# Patient Record
Sex: Male | Born: 1960 | Race: White | Hispanic: No | Marital: Married | Smoking: Former smoker
Health system: Southern US, Community
[De-identification: ages and names within clinical notes are randomized; demographics above are authoritative.]

## PROBLEM LIST (undated history)

## (undated) DIAGNOSIS — E039 Hypothyroidism, unspecified: Secondary | ICD-10-CM

## (undated) DIAGNOSIS — I509 Heart failure, unspecified: Secondary | ICD-10-CM

## (undated) DIAGNOSIS — R569 Unspecified convulsions: Secondary | ICD-10-CM

## (undated) DIAGNOSIS — J449 Chronic obstructive pulmonary disease, unspecified: Secondary | ICD-10-CM

## (undated) DIAGNOSIS — C3490 Malignant neoplasm of unspecified part of unspecified bronchus or lung: Secondary | ICD-10-CM

## (undated) HISTORY — PX: BACK SURGERY: SHX140

## (undated) HISTORY — DX: Malignant neoplasm of unspecified part of unspecified bronchus or lung: C34.90

## (undated) HISTORY — DX: Chronic obstructive pulmonary disease, unspecified: J44.9

## (undated) HISTORY — PX: PORTACATH PLACEMENT: SHX2246

---

## 2009-02-12 ENCOUNTER — Emergency Department: Payer: Self-pay | Admitting: Emergency Medicine

## 2009-02-13 ENCOUNTER — Emergency Department: Payer: Self-pay | Admitting: Emergency Medicine

## 2009-03-02 ENCOUNTER — Emergency Department (HOSPITAL_COMMUNITY): Admission: EM | Admit: 2009-03-02 | Discharge: 2009-03-02 | Payer: Self-pay | Admitting: Emergency Medicine

## 2010-03-01 ENCOUNTER — Encounter: Payer: Self-pay | Admitting: Internal Medicine

## 2010-03-01 ENCOUNTER — Ambulatory Visit: Payer: Self-pay

## 2010-03-01 ENCOUNTER — Encounter: Payer: Self-pay | Admitting: Family Medicine

## 2010-03-01 DIAGNOSIS — M549 Dorsalgia, unspecified: Secondary | ICD-10-CM

## 2010-03-01 DIAGNOSIS — M5430 Sciatica, unspecified side: Secondary | ICD-10-CM | POA: Insufficient documentation

## 2010-03-08 NOTE — Assessment & Plan Note (Signed)
Summary: back issues/jbb    The patient and/or caregiver has been counseled thoroughly with regard to medications prescribed including dosage, schedule, interactions, rationale for use, and possible side effects and they verbalize understanding.  Diagnoses and expected course of recovery discussed and will return if not improved as expected or if the condition worsens. Patient and/or caregiver verbalized understanding.

## 2010-03-08 NOTE — Assessment & Plan Note (Signed)
Summary: Back Pain   Vital Signs:  Patient Profile:   50 Years Old Male CC:      Lower Back Pain Height:     68 inches Weight:      126 pounds BMI:     19.23 O2 Sat:      97 % O2 treatment:    Room Air Temp:     96.4 degrees F tympanic Pulse rate:   105 / minute Pulse rhythm:   regular Resp:     20 per minute BP sitting:   141 / 91  (right arm)  Pt. in pain?   yes    Location:   lower back    Intensity:   5    Type:       aching  Vitals Entered By: Levonne Spiller EMT-P (March 01, 2010 10:57 AM)              Is Patient Diabetic? No  Does patient need assistance? Functional Status Self care Ambulation Normal Comments Pt. is a smoker. 1 pack per day.      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 His low back pain has gotten worse and is continuous and severe during the day and is only helped by being in bed laying on his side with hips flexed. The pain is dull bilateral lumbar and radiated down the post/lateral right leg to the ankle. patient denies weakness or numbness in the legs or rectal/sexual abnl.  Patient went to [facility] and was told he had "chronic back pain", was prescribed ibup and valium. No diagnostics done. While working in [employer], patient was told he had a "pinched nerve in the neck"  REVIEW OF SYSTEMS Constitutional Symptoms      Denies fever, chills, night sweats, weight loss, weight gain, and fatigue.  Eyes       Denies change in vision, eye pain, eye discharge, glasses, contact lenses, and eye surgery. Ear/Nose/Throat/Mouth       Denies hearing loss/aids, change in hearing, ear pain, ear discharge, dizziness, frequent runny nose, frequent nose bleeds, sinus problems, sore throat, hoarseness, and tooth pain or  bleeding.  Respiratory       Denies dry cough, productive cough, wheezing, shortness of breath, asthma, bronchitis, and emphysema/COPD.  Cardiovascular       Denies murmurs, chest pain, and tires easily with exhertion.    Gastrointestinal       Denies stomach pain, nausea/vomiting, diarrhea, constipation, blood in bowel movements, and indigestion. Genitourniary       Denies painful urination, blood or discharge from penis, kidney stones, and loss of urinary control. Neurological       Denies paralysis, seizures, and fainting/blackouts. Musculoskeletal       Complains of muscle pain and joint pain.      Denies joint stiffness, decreased range of motion, redness, swelling, muscle weakness, and gout.  Skin       Denies bruising, unusual mles/lumps or sores, and hair/skin or nail changes.  Psych       Complains of anxiety/stress and depression.      Denies mood changes, temper/anger issues, speech problems, and sleep problems.  Past History:  Family History: Last updated: 03/01/2010 copd with resp failure cancer  Social History: Last updated: 03/01/2010  Current Smoker Alcohol use-no Drug use-no  Past Medical History: Unremarkable  Past Surgical History: Denies surgical history  Family History: copd with resp failure cancer  Social History: Current Smoker Alcohol use-no Drug use-no Smoking Status:  current Drug Use:  no Physical Exam General appearance: Thin, no acute distress Head: normocephalic, atraumatic Eyes: conjunctivae and lids normal Neck: neck supple,  Extremities: normal extremities Neurological: grossly intact and non-focal. normal leg motor, and sharp sensation in legs Back: no tenderness over musculature, straight leg raises negative bilaterally ( pain in post thighs only), deep tendon reflexes 2+ at achilles and patella Skin: no obvious rashes MSE: oriented to time, place, and person Assessment New Problems: SCIATICA (ICD-724.3) BACK PAIN  (ICD-724.5)   Plan New Medications/Changes: MELOXICAM 15 MG TABS (MELOXICAM) 1 by mouth once daily with food  #30 x 0, 03/01/2010, J. Juline Patch MD   The patient and/or caregiver has been counseled thoroughly with regard to medications prescribed including dosage, schedule, interactions, rationale for use, and possible side effects and they verbalize understanding.  Diagnoses and expected course of recovery discussed and will return if not improved as expected or if the condition worsens. Patient and/or caregiver verbalized understanding.  Prescriptions: MELOXICAM 15 MG TABS (MELOXICAM) 1 by mouth once daily with food  #30 x 0   Entered and Authorized by:   J. Juline Patch MD   Signed by:   Shela Commons. Juline Patch MD on 03/01/2010   Method used:   Electronically to        [facility] (retail)       [address]       [place], [place]  [address]       Ph: [phone]       Fax: [phone]   RxID:   [id number]   Patient Instructions: 1)  get lumbar back brace. 2)  stop ibuprofen. 3)  take the valium only while home. 4)  stop smoking 5)  get blood pressure recheck. 6)  Please schedule an appointment with your primary doctor in 1 week for further evaluation and treatment. 7)  restrict lifting, pushing, pulling to 20 lbs until recheck.

## 2010-04-12 LAB — DIFFERENTIAL
Lymphocytes Relative: 15 % (ref 12–46)
Lymphs Abs: 1.5 10*3/uL (ref 0.7–4.0)
Monocytes Relative: 8 % (ref 3–12)
Neutro Abs: 7.8 10*3/uL — ABNORMAL HIGH (ref 1.7–7.7)
Neutrophils Relative %: 77 % (ref 43–77)

## 2010-04-12 LAB — POCT I-STAT, CHEM 8
BUN: 12 mg/dL (ref 6–23)
Calcium, Ion: 1.08 mmol/L — ABNORMAL LOW (ref 1.12–1.32)
Creatinine, Ser: 0.8 mg/dL (ref 0.4–1.5)
Glucose, Bld: 98 mg/dL (ref 70–99)
HCT: 40 % (ref 39.0–52.0)
Hemoglobin: 13.6 g/dL (ref 13.0–17.0)
Sodium: 140 mEq/L (ref 135–145)
TCO2: 31 mmol/L (ref 0–100)

## 2010-04-12 LAB — CK TOTAL AND CKMB (NOT AT ARMC)
CK, MB: 0.5 ng/mL (ref 0.3–4.0)
Relative Index: INVALID (ref 0.0–2.5)
Relative Index: INVALID (ref 0.0–2.5)
Total CK: 32 U/L (ref 7–232)

## 2010-04-12 LAB — POCT CARDIAC MARKERS: Troponin i, poc: 0.13 ng/mL — ABNORMAL HIGH (ref 0.00–0.09)

## 2010-04-12 LAB — CBC
HCT: 40.6 % (ref 39.0–52.0)
Platelets: 175 10*3/uL (ref 150–400)
RBC: 4.44 MIL/uL (ref 4.22–5.81)
WBC: 10.2 10*3/uL (ref 4.0–10.5)

## 2010-04-12 LAB — TROPONIN I: Troponin I: 0.01 ng/mL (ref 0.00–0.06)

## 2010-08-09 ENCOUNTER — Emergency Department: Payer: Self-pay | Admitting: Emergency Medicine

## 2011-01-31 ENCOUNTER — Emergency Department: Payer: Self-pay | Admitting: Emergency Medicine

## 2011-01-31 LAB — COMPREHENSIVE METABOLIC PANEL
Albumin: 3.3 g/dL — ABNORMAL LOW (ref 3.4–5.0)
Alkaline Phosphatase: 64 U/L (ref 50–136)
BUN: 19 mg/dL — ABNORMAL HIGH (ref 7–18)
Bilirubin,Total: 0.3 mg/dL (ref 0.2–1.0)
Creatinine: 0.77 mg/dL (ref 0.60–1.30)
Glucose: 79 mg/dL (ref 65–99)
Osmolality: 282 (ref 275–301)
SGPT (ALT): 26 U/L
Sodium: 141 mmol/L (ref 136–145)

## 2011-01-31 LAB — CBC
MCH: 29.7 pg (ref 26.0–34.0)
MCHC: 33.4 g/dL (ref 32.0–36.0)
MCV: 89 fL (ref 80–100)
Platelet: 197 10*3/uL (ref 150–440)
RBC: 4.6 10*6/uL (ref 4.40–5.90)

## 2011-02-07 ENCOUNTER — Emergency Department: Payer: Self-pay | Admitting: Unknown Physician Specialty

## 2011-02-07 LAB — CBC WITH DIFFERENTIAL/PLATELET
Basophil %: 0.4 %
HCT: 44.1 % (ref 40.0–52.0)
HGB: 14.5 g/dL (ref 13.0–18.0)
Lymphocyte %: 9.3 %
MCHC: 32.8 g/dL (ref 32.0–36.0)
MCV: 90 fL (ref 80–100)
Monocyte #: 0.6 10*3/uL (ref 0.0–0.7)
Monocyte %: 4.4 %
Neutrophil #: 10.8 10*3/uL — ABNORMAL HIGH (ref 1.4–6.5)

## 2011-02-07 LAB — URINALYSIS, COMPLETE
Blood: NEGATIVE
Ketone: NEGATIVE
Ph: 7 (ref 4.5–8.0)
Protein: NEGATIVE
Specific Gravity: 1.003 (ref 1.003–1.030)
WBC UR: <1 /HPF (ref 0–5)

## 2011-02-07 LAB — BASIC METABOLIC PANEL
Anion Gap: 9 (ref 7–16)
Calcium, Total: 9.4 mg/dL (ref 8.5–10.1)
Creatinine: 0.81 mg/dL (ref 0.60–1.30)
EGFR (African American): >60
EGFR (Non-African Amer.): >60
Glucose: 83 mg/dL (ref 65–99)
Sodium: 142 mmol/L (ref 136–145)

## 2011-09-16 ENCOUNTER — Ambulatory Visit: Payer: Self-pay | Admitting: Family Medicine

## 2011-11-23 ENCOUNTER — Emergency Department: Payer: Self-pay | Admitting: Emergency Medicine

## 2011-11-23 LAB — COMPREHENSIVE METABOLIC PANEL
Albumin: 3.6 g/dL (ref 3.4–5.0)
Alkaline Phosphatase: 70 U/L (ref 50–136)
Bilirubin,Total: 0.4 mg/dL (ref 0.2–1.0)
Co2: 33 mmol/L — ABNORMAL HIGH (ref 21–32)
Creatinine: 0.86 mg/dL (ref 0.60–1.30)
EGFR (Non-African Amer.): >60
Glucose: 90 mg/dL (ref 65–99)
Osmolality: 282 (ref 275–301)
SGPT (ALT): 31 U/L (ref 12–78)
Sodium: 142 mmol/L (ref 136–145)

## 2011-11-23 LAB — CBC
HCT: 43.7 % (ref 40.0–52.0)
MCV: 91 fL (ref 80–100)
Platelet: 193 10*3/uL (ref 150–440)
RBC: 4.82 10*6/uL (ref 4.40–5.90)
WBC: 7.1 10*3/uL (ref 3.8–10.6)

## 2011-12-31 ENCOUNTER — Ambulatory Visit: Payer: Self-pay | Admitting: Family Medicine

## 2012-02-13 ENCOUNTER — Ambulatory Visit: Payer: Self-pay | Admitting: Gastroenterology

## 2013-01-21 DIAGNOSIS — C3490 Malignant neoplasm of unspecified part of unspecified bronchus or lung: Secondary | ICD-10-CM

## 2013-01-21 HISTORY — DX: Malignant neoplasm of unspecified part of unspecified bronchus or lung: C34.90

## 2013-06-20 LAB — BASIC METABOLIC PANEL
ANION GAP: 7 (ref 7–16)
BUN: 16 mg/dL (ref 7–18)
CALCIUM: 9.4 mg/dL (ref 8.5–10.1)
CHLORIDE: 97 mmol/L — AB (ref 98–107)
CREATININE: 0.81 mg/dL (ref 0.60–1.30)
Co2: 30 mmol/L (ref 21–32)
EGFR (Non-African Amer.): >60
Glucose: 142 mg/dL — ABNORMAL HIGH (ref 65–99)
Osmolality: 272 (ref 275–301)
POTASSIUM: 4.1 mmol/L (ref 3.5–5.1)
Sodium: 134 mmol/L — ABNORMAL LOW (ref 136–145)

## 2013-06-20 LAB — CBC
HCT: 46.4 % (ref 40.0–52.0)
HGB: 14.9 g/dL (ref 13.0–18.0)
MCH: 28.4 pg (ref 26.0–34.0)
MCHC: 32.1 g/dL (ref 32.0–36.0)
MCV: 89 fL (ref 80–100)
Platelet: 263 10*3/uL (ref 150–440)
RBC: 5.23 10*6/uL (ref 4.40–5.90)
RDW: 13.7 % (ref 11.5–14.5)
WBC: 7.3 10*3/uL (ref 3.8–10.6)

## 2013-06-20 LAB — TROPONIN I: Troponin-I: <0.02 ng/mL

## 2013-06-21 ENCOUNTER — Ambulatory Visit: Payer: Self-pay | Admitting: Internal Medicine

## 2013-06-21 ENCOUNTER — Inpatient Hospital Stay: Payer: Self-pay | Admitting: Internal Medicine

## 2013-06-22 LAB — CBC WITH DIFFERENTIAL/PLATELET
Basophil #: 0 10*3/uL (ref 0.0–0.1)
Basophil %: 0.1 %
EOS PCT: 0 %
Eosinophil #: 0 10*3/uL (ref 0.0–0.7)
HCT: 39.5 % — ABNORMAL LOW (ref 40.0–52.0)
HGB: 13.1 g/dL (ref 13.0–18.0)
Lymphocyte #: 0.8 10*3/uL — ABNORMAL LOW (ref 1.0–3.6)
Lymphocyte %: 5.1 %
MCH: 28.9 pg (ref 26.0–34.0)
MCHC: 33.2 g/dL (ref 32.0–36.0)
MCV: 87 fL (ref 80–100)
MONO ABS: 0.5 x10 3/mm (ref 0.2–1.0)
MONOS PCT: 2.9 %
NEUTROS ABS: 15.1 10*3/uL — AB (ref 1.4–6.5)
NEUTROS PCT: 91.9 %
PLATELETS: 243 10*3/uL (ref 150–440)
RBC: 4.53 10*6/uL (ref 4.40–5.90)
RDW: 13.5 % (ref 11.5–14.5)
WBC: 16.4 10*3/uL — AB (ref 3.8–10.6)

## 2013-06-22 LAB — BASIC METABOLIC PANEL
Anion Gap: 5 — ABNORMAL LOW (ref 7–16)
BUN: 14 mg/dL (ref 7–18)
CALCIUM: 8.8 mg/dL (ref 8.5–10.1)
Chloride: 104 mmol/L (ref 98–107)
Co2: 30 mmol/L (ref 21–32)
Creatinine: 0.73 mg/dL (ref 0.60–1.30)
GLUCOSE: 138 mg/dL — AB (ref 65–99)
Osmolality: 280 (ref 275–301)
Potassium: 3.9 mmol/L (ref 3.5–5.1)
Sodium: 139 mmol/L (ref 136–145)

## 2013-06-22 LAB — MAGNESIUM: MAGNESIUM: 1.9 mg/dL

## 2013-06-24 LAB — PROTIME-INR
INR: 0.9
Prothrombin Time: 12.5 secs (ref 11.5–14.7)

## 2013-06-24 LAB — APTT: ACTIVATED PTT: 27.7 s (ref 23.6–35.9)

## 2013-07-02 ENCOUNTER — Ambulatory Visit: Payer: Self-pay | Admitting: Internal Medicine

## 2013-07-02 DIAGNOSIS — C3411 Malignant neoplasm of upper lobe, right bronchus or lung: Secondary | ICD-10-CM | POA: Insufficient documentation

## 2013-07-07 ENCOUNTER — Ambulatory Visit: Payer: Self-pay | Admitting: Internal Medicine

## 2013-07-07 LAB — COMPREHENSIVE METABOLIC PANEL
Albumin: 2.7 g/dL — ABNORMAL LOW (ref 3.4–5.0)
Alkaline Phosphatase: 159 U/L — ABNORMAL HIGH
Anion Gap: 7 (ref 7–16)
BILIRUBIN TOTAL: 0.3 mg/dL (ref 0.2–1.0)
BUN: 11 mg/dL (ref 7–18)
CALCIUM: 10 mg/dL (ref 8.5–10.1)
CHLORIDE: 101 mmol/L (ref 98–107)
CREATININE: 0.89 mg/dL (ref 0.60–1.30)
Co2: 34 mmol/L — ABNORMAL HIGH (ref 21–32)
EGFR (Non-African Amer.): >60
Glucose: 57 mg/dL — ABNORMAL LOW (ref 65–99)
Osmolality: 280 (ref 275–301)
POTASSIUM: 4.1 mmol/L (ref 3.5–5.1)
SGOT(AST): 29 U/L (ref 15–37)
SGPT (ALT): 48 U/L (ref 12–78)
Sodium: 142 mmol/L (ref 136–145)
Total Protein: 7.4 g/dL (ref 6.4–8.2)

## 2013-07-07 LAB — CBC CANCER CENTER
BASOS PCT: 0.4 %
Basophil #: 0 x10 3/mm (ref 0.0–0.1)
EOS ABS: 0.5 x10 3/mm (ref 0.0–0.7)
Eosinophil %: 4.5 %
HCT: 36.5 % — AB (ref 40.0–52.0)
HGB: 12.1 g/dL — AB (ref 13.0–18.0)
LYMPHS ABS: 1.4 x10 3/mm (ref 1.0–3.6)
Lymphocyte %: 12.4 %
MCH: 29.1 pg (ref 26.0–34.0)
MCHC: 33.1 g/dL (ref 32.0–36.0)
MCV: 88 fL (ref 80–100)
Monocyte #: 1.4 x10 3/mm — ABNORMAL HIGH (ref 0.2–1.0)
Monocyte %: 12.3 %
NEUTROS ABS: 7.7 x10 3/mm — AB (ref 1.4–6.5)
Neutrophil %: 70.4 %
Platelet: 292 x10 3/mm (ref 150–440)
RBC: 4.14 10*6/uL — AB (ref 4.40–5.90)
RDW: 14.9 % — AB (ref 11.5–14.5)
WBC: 11 x10 3/mm — AB (ref 3.8–10.6)

## 2013-07-07 LAB — PROTIME-INR
INR: 1
PROTHROMBIN TIME: 13 s (ref 11.5–14.7)

## 2013-07-07 LAB — PATHOLOGY REPORT

## 2013-07-12 ENCOUNTER — Inpatient Hospital Stay: Payer: Self-pay | Admitting: Cardiothoracic Surgery

## 2013-07-14 LAB — CBC WITH DIFFERENTIAL/PLATELET
BASOS ABS: 0.1 10*3/uL (ref 0.0–0.1)
Basophil %: 0.6 %
EOS PCT: 4.8 %
Eosinophil #: 0.6 10*3/uL (ref 0.0–0.7)
HCT: 36.6 % — AB (ref 40.0–52.0)
HGB: 11.6 g/dL — ABNORMAL LOW (ref 13.0–18.0)
Lymphocyte #: 2 10*3/uL (ref 1.0–3.6)
Lymphocyte %: 17 %
MCH: 27.9 pg (ref 26.0–34.0)
MCHC: 31.7 g/dL — ABNORMAL LOW (ref 32.0–36.0)
MCV: 88 fL (ref 80–100)
MONO ABS: 1 x10 3/mm (ref 0.2–1.0)
Monocyte %: 8.2 %
NEUTROS PCT: 69.4 %
Neutrophil #: 8.1 10*3/uL — ABNORMAL HIGH (ref 1.4–6.5)
Platelet: 464 10*3/uL — ABNORMAL HIGH (ref 150–440)
RBC: 4.16 10*6/uL — AB (ref 4.40–5.90)
RDW: 15 % — AB (ref 11.5–14.5)
WBC: 11.7 10*3/uL — AB (ref 3.8–10.6)

## 2013-07-14 LAB — BASIC METABOLIC PANEL
Anion Gap: 6 — ABNORMAL LOW (ref 7–16)
BUN: 17 mg/dL (ref 7–18)
Calcium, Total: 8.9 mg/dL (ref 8.5–10.1)
Chloride: 98 mmol/L (ref 98–107)
Co2: 33 mmol/L — ABNORMAL HIGH (ref 21–32)
Creatinine: 0.77 mg/dL (ref 0.60–1.30)
EGFR (Non-African Amer.): >60
Glucose: 117 mg/dL — ABNORMAL HIGH (ref 65–99)
OSMOLALITY: 276 (ref 275–301)
Potassium: 3.6 mmol/L (ref 3.5–5.1)
Sodium: 137 mmol/L (ref 136–145)

## 2013-07-17 LAB — CBC WITH DIFFERENTIAL/PLATELET
Basophil #: 0.1 10*3/uL (ref 0.0–0.1)
Basophil %: 0.9 %
EOS ABS: 1 10*3/uL — AB (ref 0.0–0.7)
Eosinophil %: 10.8 %
HCT: 34.8 % — ABNORMAL LOW (ref 40.0–52.0)
HGB: 11.7 g/dL — ABNORMAL LOW (ref 13.0–18.0)
Lymphocyte #: 1.8 10*3/uL (ref 1.0–3.6)
Lymphocyte %: 18.7 %
MCH: 29.1 pg (ref 26.0–34.0)
MCHC: 33.6 g/dL (ref 32.0–36.0)
MCV: 87 fL (ref 80–100)
Monocyte #: 1.2 x10 3/mm — ABNORMAL HIGH (ref 0.2–1.0)
Monocyte %: 12.8 %
Neutrophil #: 5.4 10*3/uL (ref 1.4–6.5)
Neutrophil %: 56.8 %
Platelet: 436 10*3/uL (ref 150–440)
RBC: 4.01 10*6/uL — AB (ref 4.40–5.90)
RDW: 15.3 % — AB (ref 11.5–14.5)
WBC: 9.5 10*3/uL (ref 3.8–10.6)

## 2013-07-17 LAB — BASIC METABOLIC PANEL
ANION GAP: 3 — AB (ref 7–16)
BUN: 22 mg/dL — ABNORMAL HIGH (ref 7–18)
CO2: 32 mmol/L (ref 21–32)
Calcium, Total: 8.8 mg/dL (ref 8.5–10.1)
Chloride: 101 mmol/L (ref 98–107)
Creatinine: 1.09 mg/dL (ref 0.60–1.30)
EGFR (Non-African Amer.): >60
Glucose: 88 mg/dL (ref 65–99)
Osmolality: 275 (ref 275–301)
Potassium: 3.9 mmol/L (ref 3.5–5.1)
Sodium: 136 mmol/L (ref 136–145)

## 2013-07-21 ENCOUNTER — Ambulatory Visit: Payer: Self-pay | Admitting: Internal Medicine

## 2013-08-02 LAB — BASIC METABOLIC PANEL
Anion Gap: 10 (ref 7–16)
BUN: 20 mg/dL — ABNORMAL HIGH (ref 7–18)
CHLORIDE: 100 mmol/L (ref 98–107)
CO2: 30 mmol/L (ref 21–32)
CREATININE: 1.04 mg/dL (ref 0.60–1.30)
Calcium, Total: 9.5 mg/dL (ref 8.5–10.1)
EGFR (Non-African Amer.): >60
GLUCOSE: 264 mg/dL — AB (ref 65–99)
OSMOLALITY: 291 (ref 275–301)
POTASSIUM: 4.2 mmol/L (ref 3.5–5.1)
SODIUM: 140 mmol/L (ref 136–145)

## 2013-08-02 LAB — CBC CANCER CENTER
BASOS PCT: 0.3 %
Basophil #: 0 x10 3/mm (ref 0.0–0.1)
Eosinophil #: 0 x10 3/mm (ref 0.0–0.7)
Eosinophil %: 0.1 %
HCT: 39.2 % — AB (ref 40.0–52.0)
HGB: 13 g/dL (ref 13.0–18.0)
LYMPHS ABS: 1 x10 3/mm (ref 1.0–3.6)
LYMPHS PCT: 15 %
MCH: 28.9 pg (ref 26.0–34.0)
MCHC: 33.1 g/dL (ref 32.0–36.0)
MCV: 88 fL (ref 80–100)
MONO ABS: 0.1 x10 3/mm — AB (ref 0.2–1.0)
MONOS PCT: 0.7 %
NEUTROS ABS: 5.7 x10 3/mm (ref 1.4–6.5)
NEUTROS PCT: 83.9 %
PLATELETS: 262 x10 3/mm (ref 150–440)
RBC: 4.48 10*6/uL (ref 4.40–5.90)
RDW: 15.7 % — ABNORMAL HIGH (ref 11.5–14.5)
WBC: 6.8 x10 3/mm (ref 3.8–10.6)

## 2013-08-02 LAB — HEPATIC FUNCTION PANEL A (ARMC)
ALK PHOS: 90 U/L
AST: 21 U/L (ref 15–37)
Albumin: 3.3 g/dL — ABNORMAL LOW (ref 3.4–5.0)
BILIRUBIN DIRECT: 0.1 mg/dL (ref 0.00–0.20)
BILIRUBIN TOTAL: 0.2 mg/dL (ref 0.2–1.0)
SGPT (ALT): 39 U/L (ref 12–78)
TOTAL PROTEIN: 7.9 g/dL (ref 6.4–8.2)

## 2013-08-05 LAB — CBC
HCT: 37.8 % — AB (ref 40.0–52.0)
HGB: 12.3 g/dL — ABNORMAL LOW (ref 13.0–18.0)
MCH: 29 pg (ref 26.0–34.0)
MCHC: 32.5 g/dL (ref 32.0–36.0)
MCV: 89 fL (ref 80–100)
PLATELETS: 214 10*3/uL (ref 150–440)
RBC: 4.24 10*6/uL — ABNORMAL LOW (ref 4.40–5.90)
RDW: 15.6 % — AB (ref 11.5–14.5)
WBC: 5.7 10*3/uL (ref 3.8–10.6)

## 2013-08-05 LAB — COMPREHENSIVE METABOLIC PANEL
ALBUMIN: 3.3 g/dL — AB (ref 3.4–5.0)
ALK PHOS: 79 U/L
ALT: 42 U/L (ref 12–78)
AST: 28 U/L (ref 15–37)
Anion Gap: 4 — ABNORMAL LOW (ref 7–16)
BILIRUBIN TOTAL: 0.4 mg/dL (ref 0.2–1.0)
BUN: 21 mg/dL — AB (ref 7–18)
CALCIUM: 8.9 mg/dL (ref 8.5–10.1)
CHLORIDE: 99 mmol/L (ref 98–107)
CO2: 35 mmol/L — AB (ref 21–32)
CREATININE: 0.88 mg/dL (ref 0.60–1.30)
Glucose: 84 mg/dL (ref 65–99)
OSMOLALITY: 278 (ref 275–301)
Potassium: 3.8 mmol/L (ref 3.5–5.1)
SODIUM: 138 mmol/L (ref 136–145)
TOTAL PROTEIN: 7 g/dL (ref 6.4–8.2)

## 2013-08-05 LAB — URINALYSIS, COMPLETE
BLOOD: NEGATIVE
Bilirubin,UR: NEGATIVE
Glucose,UR: NEGATIVE mg/dL (ref 0–75)
Ketone: NEGATIVE
Leukocyte Esterase: NEGATIVE
NITRITE: NEGATIVE
PH: 7 (ref 4.5–8.0)
PROTEIN: NEGATIVE
SPECIFIC GRAVITY: 1.013 (ref 1.003–1.030)
WBC UR: 3 /HPF (ref 0–5)

## 2013-08-05 LAB — TROPONIN I

## 2013-08-06 ENCOUNTER — Other Ambulatory Visit: Payer: Self-pay | Admitting: Physician Assistant

## 2013-08-06 ENCOUNTER — Observation Stay: Payer: Self-pay | Admitting: Internal Medicine

## 2013-08-06 DIAGNOSIS — C3490 Malignant neoplasm of unspecified part of unspecified bronchus or lung: Secondary | ICD-10-CM

## 2013-08-06 DIAGNOSIS — M79609 Pain in unspecified limb: Secondary | ICD-10-CM

## 2013-08-06 DIAGNOSIS — R079 Chest pain, unspecified: Secondary | ICD-10-CM

## 2013-08-06 LAB — TROPONIN I

## 2013-08-06 LAB — CK
CK, TOTAL: 26 U/L — AB
CK, TOTAL: 28 U/L — AB

## 2013-08-07 LAB — LIPID PANEL
CHOLESTEROL: 191 mg/dL (ref 0–200)
HDL Cholesterol: 55 mg/dL (ref 40–60)
Ldl Cholesterol, Calc: 112 mg/dL — ABNORMAL HIGH (ref 0–100)
Triglycerides: 122 mg/dL (ref 0–200)
VLDL Cholesterol, Calc: 24 mg/dL (ref 5–40)

## 2013-08-07 LAB — BASIC METABOLIC PANEL
Anion Gap: 5 — ABNORMAL LOW (ref 7–16)
BUN: 16 mg/dL (ref 7–18)
CHLORIDE: 99 mmol/L (ref 98–107)
CO2: 31 mmol/L (ref 21–32)
CREATININE: 0.72 mg/dL (ref 0.60–1.30)
Calcium, Total: 8.7 mg/dL (ref 8.5–10.1)
EGFR (Non-African Amer.): >60
GLUCOSE: 80 mg/dL (ref 65–99)
Osmolality: 270 (ref 275–301)
POTASSIUM: 4.1 mmol/L (ref 3.5–5.1)
SODIUM: 135 mmol/L — AB (ref 136–145)

## 2013-08-07 LAB — CBC WITH DIFFERENTIAL/PLATELET
Basophil #: 0 10*3/uL (ref 0.0–0.1)
Basophil %: 0.5 %
EOS ABS: 0.4 10*3/uL (ref 0.0–0.7)
EOS PCT: 8.7 %
HCT: 37.2 % — ABNORMAL LOW (ref 40.0–52.0)
HGB: 12.1 g/dL — ABNORMAL LOW (ref 13.0–18.0)
LYMPHS ABS: 1.3 10*3/uL (ref 1.0–3.6)
Lymphocyte %: 25.7 %
MCH: 28.6 pg (ref 26.0–34.0)
MCHC: 32.6 g/dL (ref 32.0–36.0)
MCV: 88 fL (ref 80–100)
MONOS PCT: 2.6 %
Monocyte #: 0.1 x10 3/mm — ABNORMAL LOW (ref 0.2–1.0)
NEUTROS PCT: 62.5 %
Neutrophil #: 3.1 10*3/uL (ref 1.4–6.5)
Platelet: 203 10*3/uL (ref 150–440)
RBC: 4.23 10*6/uL — AB (ref 4.40–5.90)
RDW: 15.7 % — AB (ref 11.5–14.5)
WBC: 5 10*3/uL (ref 3.8–10.6)

## 2013-08-07 LAB — MAGNESIUM: Magnesium: 1.8 mg/dL

## 2013-08-09 LAB — CBC CANCER CENTER
Basophil #: 0 x10 3/mm (ref 0.0–0.1)
Basophil %: 0.7 %
EOS PCT: 8 %
Eosinophil #: 0.4 x10 3/mm (ref 0.0–0.7)
HCT: 37.2 % — ABNORMAL LOW (ref 40.0–52.0)
HGB: 12.1 g/dL — ABNORMAL LOW (ref 13.0–18.0)
LYMPHS ABS: 1.2 x10 3/mm (ref 1.0–3.6)
Lymphocyte %: 22.5 %
MCH: 28.5 pg (ref 26.0–34.0)
MCHC: 32.5 g/dL (ref 32.0–36.0)
MCV: 88 fL (ref 80–100)
MONO ABS: 0.1 x10 3/mm — AB (ref 0.2–1.0)
Monocyte %: 2.9 %
Neutrophil #: 3.4 x10 3/mm (ref 1.4–6.5)
Neutrophil %: 65.9 %
Platelet: 226 x10 3/mm (ref 150–440)
RBC: 4.24 10*6/uL — ABNORMAL LOW (ref 4.40–5.90)
RDW: 15 % — ABNORMAL HIGH (ref 11.5–14.5)
WBC: 5.1 x10 3/mm (ref 3.8–10.6)

## 2013-08-16 LAB — HEPATIC FUNCTION PANEL A (ARMC)
ALBUMIN: 3 g/dL — AB (ref 3.4–5.0)
ALT: 31 U/L
AST: 18 U/L (ref 15–37)
Alkaline Phosphatase: 79 U/L
BILIRUBIN TOTAL: 0.2 mg/dL (ref 0.2–1.0)
TOTAL PROTEIN: 6.6 g/dL (ref 6.4–8.2)

## 2013-08-16 LAB — CBC CANCER CENTER
BASOS ABS: 0 x10 3/mm (ref 0.0–0.1)
Basophil %: 1.7 %
EOS PCT: 11.3 %
Eosinophil #: 0.3 x10 3/mm (ref 0.0–0.7)
HCT: 36.2 % — ABNORMAL LOW (ref 40.0–52.0)
HGB: 12 g/dL — AB (ref 13.0–18.0)
Lymphocyte #: 1.2 x10 3/mm (ref 1.0–3.6)
Lymphocyte %: 50.4 %
MCH: 29 pg (ref 26.0–34.0)
MCHC: 33.1 g/dL (ref 32.0–36.0)
MCV: 88 fL (ref 80–100)
MONOS PCT: 25.4 %
Monocyte #: 0.6 x10 3/mm (ref 0.2–1.0)
Neutrophil #: 0.3 x10 3/mm — ABNORMAL LOW (ref 1.4–6.5)
Neutrophil %: 11.2 %
Platelet: 214 x10 3/mm (ref 150–440)
RBC: 4.13 10*6/uL — AB (ref 4.40–5.90)
RDW: 15.5 % — ABNORMAL HIGH (ref 11.5–14.5)
WBC: 2.4 x10 3/mm — ABNORMAL LOW (ref 3.8–10.6)

## 2013-08-16 LAB — CREATININE, SERUM
Creatinine: 1.05 mg/dL (ref 0.60–1.30)
EGFR (African American): >60

## 2013-08-21 ENCOUNTER — Ambulatory Visit: Payer: Self-pay | Admitting: Internal Medicine

## 2013-08-23 LAB — CBC CANCER CENTER
Basophil #: 0 x10 3/mm (ref 0.0–0.1)
Basophil %: 0.4 %
EOS PCT: 0 %
Eosinophil #: 0 x10 3/mm (ref 0.0–0.7)
HCT: 38.2 % — AB (ref 40.0–52.0)
HGB: 12.6 g/dL — AB (ref 13.0–18.0)
LYMPHS PCT: 9.7 %
Lymphocyte #: 0.4 x10 3/mm — ABNORMAL LOW (ref 1.0–3.6)
MCH: 28.7 pg (ref 26.0–34.0)
MCHC: 32.9 g/dL (ref 32.0–36.0)
MCV: 87 fL (ref 80–100)
MONO ABS: 0.1 x10 3/mm — AB (ref 0.2–1.0)
MONOS PCT: 1.7 %
NEUTROS ABS: 3.7 x10 3/mm (ref 1.4–6.5)
Neutrophil %: 88.2 %
PLATELETS: 220 x10 3/mm (ref 150–440)
RBC: 4.38 10*6/uL — ABNORMAL LOW (ref 4.40–5.90)
RDW: 15.4 % — ABNORMAL HIGH (ref 11.5–14.5)
WBC: 4.2 x10 3/mm (ref 3.8–10.6)

## 2013-08-23 LAB — BASIC METABOLIC PANEL
Anion Gap: 6 — ABNORMAL LOW (ref 7–16)
BUN: 15 mg/dL (ref 7–18)
CO2: 32 mmol/L (ref 21–32)
CREATININE: 1.05 mg/dL (ref 0.60–1.30)
Calcium, Total: 9.5 mg/dL (ref 8.5–10.1)
Chloride: 100 mmol/L (ref 98–107)
EGFR (Non-African Amer.): >60
Glucose: 195 mg/dL — ABNORMAL HIGH (ref 65–99)
OSMOLALITY: 282 (ref 275–301)
Potassium: 4.2 mmol/L (ref 3.5–5.1)
Sodium: 138 mmol/L (ref 136–145)

## 2013-08-30 LAB — CBC CANCER CENTER
Basophil #: 0 x10 3/mm (ref 0.0–0.1)
Basophil %: 1.3 %
EOS ABS: 0.2 x10 3/mm (ref 0.0–0.7)
Eosinophil %: 7.4 %
HCT: 38.5 % — AB (ref 40.0–52.0)
HGB: 12.6 g/dL — AB (ref 13.0–18.0)
LYMPHS ABS: 0.6 x10 3/mm — AB (ref 1.0–3.6)
LYMPHS PCT: 22.5 %
MCH: 28.5 pg (ref 26.0–34.0)
MCHC: 32.6 g/dL (ref 32.0–36.0)
MCV: 87 fL (ref 80–100)
Monocyte #: 0.2 x10 3/mm (ref 0.2–1.0)
Monocyte %: 5.6 %
NEUTROS ABS: 1.7 x10 3/mm (ref 1.4–6.5)
Neutrophil %: 63.2 %
PLATELETS: 150 x10 3/mm (ref 150–440)
RBC: 4.4 10*6/uL (ref 4.40–5.90)
RDW: 14.8 % — AB (ref 11.5–14.5)
WBC: 2.7 x10 3/mm — ABNORMAL LOW (ref 3.8–10.6)

## 2013-09-03 ENCOUNTER — Emergency Department: Payer: Self-pay | Admitting: Emergency Medicine

## 2013-09-03 LAB — COMPREHENSIVE METABOLIC PANEL
ALK PHOS: 83 U/L
ANION GAP: 3 — AB (ref 7–16)
AST: 14 U/L — AB (ref 15–37)
Albumin: 3.2 g/dL — ABNORMAL LOW (ref 3.4–5.0)
BUN: 14 mg/dL (ref 7–18)
Bilirubin,Total: 0.1 mg/dL — ABNORMAL LOW (ref 0.2–1.0)
CO2: 34 mmol/L — AB (ref 21–32)
Calcium, Total: 8.8 mg/dL (ref 8.5–10.1)
Chloride: 105 mmol/L (ref 98–107)
Creatinine: 0.99 mg/dL (ref 0.60–1.30)
EGFR (African American): >60
EGFR (Non-African Amer.): >60
Glucose: 77 mg/dL (ref 65–99)
Osmolality: 282 (ref 275–301)
Potassium: 4.2 mmol/L (ref 3.5–5.1)
SGPT (ALT): 24 U/L
Sodium: 142 mmol/L (ref 136–145)
Total Protein: 6.5 g/dL (ref 6.4–8.2)

## 2013-09-03 LAB — CBC WITH DIFFERENTIAL/PLATELET
Basophil #: 0 10*3/uL (ref 0.0–0.1)
Basophil %: 1.1 %
EOS PCT: 6.7 %
Eosinophil #: 0.1 10*3/uL (ref 0.0–0.7)
HCT: 35.7 % — ABNORMAL LOW (ref 40.0–52.0)
HGB: 11.6 g/dL — ABNORMAL LOW (ref 13.0–18.0)
LYMPHS ABS: 0.4 10*3/uL — AB (ref 1.0–3.6)
Lymphocyte %: 19 %
MCH: 28.9 pg (ref 26.0–34.0)
MCHC: 32.4 g/dL (ref 32.0–36.0)
MCV: 89 fL (ref 80–100)
Monocyte #: 0.3 x10 3/mm (ref 0.2–1.0)
Monocyte %: 14.3 %
NEUTROS PCT: 58.9 %
Neutrophil #: 1.1 10*3/uL — ABNORMAL LOW (ref 1.4–6.5)
Platelet: 154 10*3/uL (ref 150–440)
RBC: 4.01 10*6/uL — AB (ref 4.40–5.90)
RDW: 15.7 % — ABNORMAL HIGH (ref 11.5–14.5)
WBC: 1.9 10*3/uL — AB (ref 3.8–10.6)

## 2013-09-03 LAB — TROPONIN I

## 2013-09-06 LAB — CBC CANCER CENTER
BASOS ABS: 0 x10 3/mm (ref 0.0–0.1)
Basophil %: 1.9 %
EOS ABS: 0.1 x10 3/mm (ref 0.0–0.7)
Eosinophil %: 7.6 %
HCT: 35.9 % — AB (ref 40.0–52.0)
HGB: 11.8 g/dL — ABNORMAL LOW (ref 13.0–18.0)
LYMPHS PCT: 25.7 %
Lymphocyte #: 0.4 x10 3/mm — ABNORMAL LOW (ref 1.0–3.6)
MCH: 28.9 pg (ref 26.0–34.0)
MCHC: 32.9 g/dL (ref 32.0–36.0)
MCV: 88 fL (ref 80–100)
MONO ABS: 0.5 x10 3/mm (ref 0.2–1.0)
Monocyte %: 37.2 %
NEUTROS ABS: 0.4 x10 3/mm — AB (ref 1.4–6.5)
Neutrophil %: 27.6 %
Platelet: 184 x10 3/mm (ref 150–440)
RBC: 4.08 10*6/uL — AB (ref 4.40–5.90)
RDW: 15.8 % — AB (ref 11.5–14.5)
WBC: 1.4 x10 3/mm — CL (ref 3.8–10.6)

## 2013-09-06 LAB — HEPATIC FUNCTION PANEL A (ARMC)
ALBUMIN: 2.9 g/dL — AB (ref 3.4–5.0)
ALK PHOS: 75 U/L
Bilirubin, Direct: 0.2 mg/dL (ref 0.00–0.20)
Bilirubin,Total: 0.2 mg/dL (ref 0.2–1.0)
SGOT(AST): 12 U/L — ABNORMAL LOW (ref 15–37)
SGPT (ALT): 26 U/L
Total Protein: 6.1 g/dL — ABNORMAL LOW (ref 6.4–8.2)

## 2013-09-06 LAB — CREATININE, SERUM
Creatinine: 1.02 mg/dL (ref 0.60–1.30)
EGFR (African American): >60
EGFR (Non-African Amer.): >60

## 2013-09-13 LAB — CBC CANCER CENTER
BASOS ABS: 0 x10 3/mm (ref 0.0–0.1)
Basophil %: 0.4 %
Eosinophil #: 0.1 x10 3/mm (ref 0.0–0.7)
Eosinophil %: 0.6 %
HCT: 41.7 % (ref 40.0–52.0)
HGB: 13.7 g/dL (ref 13.0–18.0)
LYMPHS PCT: 5.6 %
Lymphocyte #: 0.5 x10 3/mm — ABNORMAL LOW (ref 1.0–3.6)
MCH: 28.8 pg (ref 26.0–34.0)
MCHC: 33 g/dL (ref 32.0–36.0)
MCV: 87 fL (ref 80–100)
Monocyte #: 0.4 x10 3/mm (ref 0.2–1.0)
Monocyte %: 4.9 %
NEUTROS ABS: 7.7 x10 3/mm — AB (ref 1.4–6.5)
Neutrophil %: 88.5 %
PLATELETS: 186 x10 3/mm (ref 150–440)
RBC: 4.77 10*6/uL (ref 4.40–5.90)
RDW: 15.6 % — ABNORMAL HIGH (ref 11.5–14.5)
WBC: 8.7 x10 3/mm (ref 3.8–10.6)

## 2013-09-13 LAB — BASIC METABOLIC PANEL
Anion Gap: 9 (ref 7–16)
BUN: 12 mg/dL (ref 7–18)
CALCIUM: 9 mg/dL (ref 8.5–10.1)
Chloride: 99 mmol/L (ref 98–107)
Co2: 28 mmol/L (ref 21–32)
Creatinine: 1.05 mg/dL (ref 0.60–1.30)
EGFR (African American): >60
Glucose: 187 mg/dL — ABNORMAL HIGH (ref 65–99)
Osmolality: 277 (ref 275–301)
Potassium: 4.2 mmol/L (ref 3.5–5.1)
SODIUM: 136 mmol/L (ref 136–145)

## 2013-09-20 LAB — CBC CANCER CENTER
Basophil #: 0 x10 3/mm (ref 0.0–0.1)
Basophil %: 1.1 %
Eosinophil #: 0.2 x10 3/mm (ref 0.0–0.7)
Eosinophil %: 6.9 %
HCT: 40 % (ref 40.0–52.0)
HGB: 13.1 g/dL (ref 13.0–18.0)
Lymphocyte #: 0.4 x10 3/mm — ABNORMAL LOW (ref 1.0–3.6)
Lymphocyte %: 11.1 %
MCH: 28.5 pg (ref 26.0–34.0)
MCHC: 32.8 g/dL (ref 32.0–36.0)
MCV: 87 fL (ref 80–100)
Monocyte #: 0.1 x10 3/mm — ABNORMAL LOW (ref 0.2–1.0)
Monocyte %: 3.8 %
NEUTROS PCT: 77.1 %
Neutrophil #: 2.7 x10 3/mm (ref 1.4–6.5)
Platelet: 134 x10 3/mm — ABNORMAL LOW (ref 150–440)
RBC: 4.6 10*6/uL (ref 4.40–5.90)
RDW: 15.1 % — AB (ref 11.5–14.5)
WBC: 3.5 x10 3/mm — AB (ref 3.8–10.6)

## 2013-09-21 ENCOUNTER — Ambulatory Visit: Payer: Self-pay | Admitting: Internal Medicine

## 2013-09-28 LAB — HEPATIC FUNCTION PANEL A (ARMC)
ALBUMIN: 3 g/dL — AB (ref 3.4–5.0)
Alkaline Phosphatase: 77 U/L
BILIRUBIN TOTAL: 0.2 mg/dL (ref 0.2–1.0)
SGOT(AST): 21 U/L (ref 15–37)
SGPT (ALT): 32 U/L
Total Protein: 6.5 g/dL (ref 6.4–8.2)

## 2013-09-28 LAB — CREATININE, SERUM
Creatinine: 1.17 mg/dL (ref 0.60–1.30)
EGFR (African American): >60

## 2013-09-28 LAB — CBC CANCER CENTER
BASOS PCT: 1.6 %
Basophil #: 0 x10 3/mm (ref 0.0–0.1)
EOS PCT: 14.3 %
Eosinophil #: 0.2 x10 3/mm (ref 0.0–0.7)
HCT: 37.8 % — ABNORMAL LOW (ref 40.0–52.0)
HGB: 12.4 g/dL — AB (ref 13.0–18.0)
LYMPHS PCT: 32.3 %
Lymphocyte #: 0.5 x10 3/mm — ABNORMAL LOW (ref 1.0–3.6)
MCH: 28.5 pg (ref 26.0–34.0)
MCHC: 32.8 g/dL (ref 32.0–36.0)
MCV: 87 fL (ref 80–100)
MONOS PCT: 38.6 %
Monocyte #: 0.6 x10 3/mm (ref 0.2–1.0)
Neutrophil #: 0.2 x10 3/mm — ABNORMAL LOW (ref 1.4–6.5)
Neutrophil %: 13.2 %
Platelet: 187 x10 3/mm (ref 150–440)
RBC: 4.34 10*6/uL — ABNORMAL LOW (ref 4.40–5.90)
RDW: 15.6 % — AB (ref 11.5–14.5)
WBC: 1.6 x10 3/mm — CL (ref 3.8–10.6)

## 2013-10-04 LAB — BASIC METABOLIC PANEL
Anion Gap: 8 (ref 7–16)
BUN: 15 mg/dL (ref 7–18)
CHLORIDE: 101 mmol/L (ref 98–107)
Calcium, Total: 9.3 mg/dL (ref 8.5–10.1)
Co2: 29 mmol/L (ref 21–32)
Creatinine: 1.02 mg/dL (ref 0.60–1.30)
EGFR (African American): >60
EGFR (Non-African Amer.): >60
Glucose: 176 mg/dL — ABNORMAL HIGH (ref 65–99)
OSMOLALITY: 281 (ref 275–301)
POTASSIUM: 4.2 mmol/L (ref 3.5–5.1)
SODIUM: 138 mmol/L (ref 136–145)

## 2013-10-04 LAB — CBC CANCER CENTER
Basophil #: 0 x10 3/mm (ref 0.0–0.1)
Basophil %: 0.2 %
EOS PCT: 0 %
Eosinophil #: 0 x10 3/mm (ref 0.0–0.7)
HCT: 40.5 % (ref 40.0–52.0)
HGB: 13.4 g/dL (ref 13.0–18.0)
LYMPHS ABS: 0.5 x10 3/mm — AB (ref 1.0–3.6)
Lymphocyte %: 9.1 %
MCH: 28.7 pg (ref 26.0–34.0)
MCHC: 33.1 g/dL (ref 32.0–36.0)
MCV: 87 fL (ref 80–100)
MONO ABS: 0.1 x10 3/mm — AB (ref 0.2–1.0)
Monocyte %: 1.9 %
Neutrophil #: 4.7 x10 3/mm (ref 1.4–6.5)
Neutrophil %: 88.8 %
PLATELETS: 223 x10 3/mm (ref 150–440)
RBC: 4.68 10*6/uL (ref 4.40–5.90)
RDW: 15.6 % — AB (ref 11.5–14.5)
WBC: 5.3 x10 3/mm (ref 3.8–10.6)

## 2013-10-11 LAB — CREATININE, SERUM
Creatinine: 1 mg/dL (ref 0.60–1.30)
EGFR (African American): >60

## 2013-10-11 LAB — CBC CANCER CENTER
Basophil #: 0 x10 3/mm (ref 0.0–0.1)
Basophil %: 0.6 %
EOS PCT: 0 %
Eosinophil #: 0 x10 3/mm (ref 0.0–0.7)
HCT: 39.1 % — AB (ref 40.0–52.0)
HGB: 12.9 g/dL — ABNORMAL LOW (ref 13.0–18.0)
LYMPHS PCT: 9.2 %
Lymphocyte #: 0.3 x10 3/mm — ABNORMAL LOW (ref 1.0–3.6)
MCH: 28.8 pg (ref 26.0–34.0)
MCHC: 32.9 g/dL (ref 32.0–36.0)
MCV: 88 fL (ref 80–100)
Monocyte #: 0 x10 3/mm — ABNORMAL LOW (ref 0.2–1.0)
Monocyte %: 0.6 %
Neutrophil #: 2.9 x10 3/mm (ref 1.4–6.5)
Neutrophil %: 89.6 %
Platelet: 170 x10 3/mm (ref 150–440)
RBC: 4.46 10*6/uL (ref 4.40–5.90)
RDW: 15.8 % — ABNORMAL HIGH (ref 11.5–14.5)
WBC: 3.2 x10 3/mm — ABNORMAL LOW (ref 3.8–10.6)

## 2013-10-15 ENCOUNTER — Inpatient Hospital Stay: Payer: Self-pay | Admitting: Internal Medicine

## 2013-10-15 LAB — COMPREHENSIVE METABOLIC PANEL
ALT: 31 U/L
ANION GAP: 4 — AB (ref 7–16)
Albumin: 3.3 g/dL — ABNORMAL LOW (ref 3.4–5.0)
Alkaline Phosphatase: 64 U/L
BUN: 20 mg/dL — ABNORMAL HIGH (ref 7–18)
Bilirubin,Total: 0.4 mg/dL (ref 0.2–1.0)
CHLORIDE: 102 mmol/L (ref 98–107)
Calcium, Total: 8.4 mg/dL — ABNORMAL LOW (ref 8.5–10.1)
Co2: 32 mmol/L (ref 21–32)
Creatinine: 1.27 mg/dL (ref 0.60–1.30)
EGFR (African American): >60
EGFR (Non-African Amer.): >60
Glucose: 102 mg/dL — ABNORMAL HIGH (ref 65–99)
Osmolality: 278 (ref 275–301)
POTASSIUM: 3.9 mmol/L (ref 3.5–5.1)
SGOT(AST): 17 U/L (ref 15–37)
SODIUM: 138 mmol/L (ref 136–145)
Total Protein: 6.4 g/dL (ref 6.4–8.2)

## 2013-10-15 LAB — CBC WITH DIFFERENTIAL/PLATELET
BASOS PCT: 0.4 %
Basophil #: 0 10*3/uL (ref 0.0–0.1)
EOS ABS: 0.1 10*3/uL (ref 0.0–0.7)
Eosinophil %: 1.7 %
HCT: 39 % — ABNORMAL LOW (ref 40.0–52.0)
HGB: 13 g/dL (ref 13.0–18.0)
Lymphocyte #: 0.3 10*3/uL — ABNORMAL LOW (ref 1.0–3.6)
Lymphocyte %: 8.5 %
MCH: 29.2 pg (ref 26.0–34.0)
MCHC: 33.3 g/dL (ref 32.0–36.0)
MCV: 88 fL (ref 80–100)
Monocyte #: 0 x10 3/mm — ABNORMAL LOW (ref 0.2–1.0)
Monocyte %: 1.5 %
NEUTROS ABS: 2.8 10*3/uL (ref 1.4–6.5)
NEUTROS PCT: 87.9 %
Platelet: 154 10*3/uL (ref 150–440)
RBC: 4.46 10*6/uL (ref 4.40–5.90)
RDW: 15.7 % — AB (ref 11.5–14.5)
WBC: 3.2 10*3/uL — ABNORMAL LOW (ref 3.8–10.6)

## 2013-10-15 LAB — URINALYSIS, COMPLETE
BACTERIA: NONE SEEN
Bilirubin,UR: NEGATIVE
Blood: NEGATIVE
GLUCOSE, UR: NEGATIVE mg/dL (ref 0–75)
Ketone: NEGATIVE
Leukocyte Esterase: NEGATIVE
Nitrite: NEGATIVE
PROTEIN: NEGATIVE
Ph: 6 (ref 4.5–8.0)
RBC, UR: NONE SEEN /HPF (ref 0–5)
SPECIFIC GRAVITY: 1.014 (ref 1.003–1.030)
Squamous Epithelial: NONE SEEN
WBC UR: 1 /HPF (ref 0–5)

## 2013-10-15 LAB — RAPID INFLUENZA A&B ANTIGENS

## 2013-10-15 LAB — TROPONIN I

## 2013-10-15 LAB — PROTIME-INR
INR: 1
Prothrombin Time: 12.8 secs (ref 11.5–14.7)

## 2013-10-15 LAB — MAGNESIUM: MAGNESIUM: 1.2 mg/dL — AB

## 2013-10-15 LAB — PHOSPHORUS: Phosphorus: 3.3 mg/dL (ref 2.5–4.9)

## 2013-10-16 LAB — CBC WITH DIFFERENTIAL/PLATELET
BASOS PCT: 0.2 %
Basophil #: 0 10*3/uL (ref 0.0–0.1)
EOS ABS: 0.1 10*3/uL (ref 0.0–0.7)
Eosinophil %: 1.3 %
HCT: 32.4 % — ABNORMAL LOW (ref 40.0–52.0)
HGB: 10.8 g/dL — ABNORMAL LOW (ref 13.0–18.0)
Lymphocyte #: 0.4 10*3/uL — ABNORMAL LOW (ref 1.0–3.6)
Lymphocyte %: 8.2 %
MCH: 29 pg (ref 26.0–34.0)
MCHC: 33.4 g/dL (ref 32.0–36.0)
MCV: 87 fL (ref 80–100)
MONOS PCT: 2.7 %
Monocyte #: 0.1 x10 3/mm — ABNORMAL LOW (ref 0.2–1.0)
NEUTROS PCT: 87.6 %
Neutrophil #: 3.7 10*3/uL (ref 1.4–6.5)
Platelet: 133 10*3/uL — ABNORMAL LOW (ref 150–440)
RBC: 3.73 10*6/uL — ABNORMAL LOW (ref 4.40–5.90)
RDW: 15.7 % — ABNORMAL HIGH (ref 11.5–14.5)
WBC: 4.3 10*3/uL (ref 3.8–10.6)

## 2013-10-16 LAB — BASIC METABOLIC PANEL
Anion Gap: 4 — ABNORMAL LOW (ref 7–16)
BUN: 15 mg/dL (ref 7–18)
CALCIUM: 7.9 mg/dL — AB (ref 8.5–10.1)
Chloride: 105 mmol/L (ref 98–107)
Co2: 29 mmol/L (ref 21–32)
Creatinine: 0.99 mg/dL (ref 0.60–1.30)
EGFR (Non-African Amer.): >60
Glucose: 97 mg/dL (ref 65–99)
Osmolality: 276 (ref 275–301)
POTASSIUM: 4 mmol/L (ref 3.5–5.1)
SODIUM: 138 mmol/L (ref 136–145)

## 2013-10-16 LAB — MAGNESIUM: MAGNESIUM: 1.6 mg/dL — AB

## 2013-10-16 LAB — URINE CULTURE

## 2013-10-17 LAB — VANCOMYCIN, TROUGH: Vancomycin, Trough: 19 ug/mL (ref 10–20)

## 2013-10-18 LAB — CREATININE, SERUM: CREATININE: 0.86 mg/dL (ref 0.60–1.30)

## 2013-10-20 LAB — CULTURE, BLOOD (SINGLE)

## 2013-10-20 LAB — EXPECTORATED SPUTUM ASSESSMENT W GRAM STAIN, RFLX TO RESP C

## 2013-10-21 ENCOUNTER — Ambulatory Visit: Payer: Self-pay | Admitting: Internal Medicine

## 2013-10-25 LAB — CBC CANCER CENTER
Basophil #: 0 x10 3/mm (ref 0.0–0.1)
Basophil %: 0.3 %
EOS PCT: 0.1 %
Eosinophil #: 0 x10 3/mm (ref 0.0–0.7)
HCT: 39.2 % — AB (ref 40.0–52.0)
HGB: 12.8 g/dL — AB (ref 13.0–18.0)
LYMPHS ABS: 0.5 x10 3/mm — AB (ref 1.0–3.6)
LYMPHS PCT: 15.3 %
MCH: 28.9 pg (ref 26.0–34.0)
MCHC: 32.7 g/dL (ref 32.0–36.0)
MCV: 89 fL (ref 80–100)
MONO ABS: 0 x10 3/mm — AB (ref 0.2–1.0)
MONOS PCT: 1.3 %
Neutrophil #: 2.6 x10 3/mm (ref 1.4–6.5)
Neutrophil %: 83 %
Platelet: 275 x10 3/mm (ref 150–440)
RBC: 4.43 10*6/uL (ref 4.40–5.90)
RDW: 16.5 % — ABNORMAL HIGH (ref 11.5–14.5)
WBC: 3.2 x10 3/mm — ABNORMAL LOW (ref 3.8–10.6)

## 2013-10-25 LAB — BASIC METABOLIC PANEL
Anion Gap: 8 (ref 7–16)
BUN: 20 mg/dL — AB (ref 7–18)
CHLORIDE: 101 mmol/L (ref 98–107)
CO2: 28 mmol/L (ref 21–32)
Calcium, Total: 9.2 mg/dL (ref 8.5–10.1)
Creatinine: 1.23 mg/dL (ref 0.60–1.30)
EGFR (Non-African Amer.): >60
GLUCOSE: 211 mg/dL — AB (ref 65–99)
Osmolality: 283 (ref 275–301)
Potassium: 3.9 mmol/L (ref 3.5–5.1)
Sodium: 137 mmol/L (ref 136–145)

## 2013-11-03 LAB — CBC WITH DIFFERENTIAL/PLATELET
Basophil #: 0 10*3/uL (ref 0.0–0.1)
Basophil %: 0.9 %
Eosinophil #: 0.1 10*3/uL (ref 0.0–0.7)
Eosinophil %: 2.9 %
Eosinophil: 2 %
HCT: 39.3 % — ABNORMAL LOW (ref 40.0–52.0)
HGB: 12.6 g/dL — ABNORMAL LOW (ref 13.0–18.0)
LYMPHS PCT: 19.1 %
Lymphocyte #: 0.7 10*3/uL — ABNORMAL LOW (ref 1.0–3.6)
Lymphocytes: 20 %
MCH: 28.6 pg (ref 26.0–34.0)
MCHC: 32 g/dL (ref 32.0–36.0)
MCV: 89 fL (ref 80–100)
Monocyte #: 0.6 x10 3/mm (ref 0.2–1.0)
Monocyte %: 16.5 %
Monocytes: 13 %
Neutrophil #: 2.2 10*3/uL (ref 1.4–6.5)
Neutrophil %: 60.6 %
Platelet: 153 10*3/uL (ref 150–440)
RBC: 4.4 10*6/uL (ref 4.40–5.90)
RDW: 16.7 % — ABNORMAL HIGH (ref 11.5–14.5)
Segmented Neutrophils: 65 %
WBC: 3.6 10*3/uL — AB (ref 3.8–10.6)

## 2013-11-03 LAB — BASIC METABOLIC PANEL
ANION GAP: 7 (ref 7–16)
BUN: 16 mg/dL (ref 7–18)
CALCIUM: 8.9 mg/dL (ref 8.5–10.1)
Chloride: 102 mmol/L (ref 98–107)
Co2: 32 mmol/L (ref 21–32)
Creatinine: 1.09 mg/dL (ref 0.60–1.30)
EGFR (African American): >60
EGFR (Non-African Amer.): >60
GLUCOSE: 94 mg/dL (ref 65–99)
OSMOLALITY: 282 (ref 275–301)
Potassium: 3.7 mmol/L (ref 3.5–5.1)
Sodium: 141 mmol/L (ref 136–145)

## 2013-11-03 LAB — URINALYSIS, COMPLETE
Bacteria: NONE SEEN
Bilirubin,UR: NEGATIVE
Blood: NEGATIVE
GLUCOSE, UR: NEGATIVE mg/dL (ref 0–75)
Ketone: NEGATIVE
Leukocyte Esterase: NEGATIVE
NITRITE: NEGATIVE
Ph: 6 (ref 4.5–8.0)
Protein: NEGATIVE
Specific Gravity: 1.012 (ref 1.003–1.030)
Squamous Epithelial: NONE SEEN
WBC UR: <1 /HPF (ref 0–5)

## 2013-11-04 ENCOUNTER — Inpatient Hospital Stay: Payer: Self-pay | Admitting: Internal Medicine

## 2013-11-04 LAB — CBC WITH DIFFERENTIAL/PLATELET
Basophil #: 0 10*3/uL (ref 0.0–0.1)
Basophil %: 1 %
EOS ABS: 0.1 10*3/uL (ref 0.0–0.7)
Eosinophil %: 3.3 %
HCT: 34.9 % — AB (ref 40.0–52.0)
HGB: 11.1 g/dL — ABNORMAL LOW (ref 13.0–18.0)
LYMPHS ABS: 0.5 10*3/uL — AB (ref 1.0–3.6)
Lymphocyte %: 17.6 %
MCH: 28.4 pg (ref 26.0–34.0)
MCHC: 31.9 g/dL — AB (ref 32.0–36.0)
MCV: 89 fL (ref 80–100)
MONO ABS: 0.5 x10 3/mm (ref 0.2–1.0)
Monocyte %: 16.9 %
NEUTROS ABS: 1.9 10*3/uL (ref 1.4–6.5)
Neutrophil %: 61.2 %
Platelet: 130 10*3/uL — ABNORMAL LOW (ref 150–440)
RBC: 3.92 10*6/uL — ABNORMAL LOW (ref 4.40–5.90)
RDW: 16.1 % — AB (ref 11.5–14.5)
WBC: 3.1 10*3/uL — ABNORMAL LOW (ref 3.8–10.6)

## 2013-11-05 LAB — CBC WITH DIFFERENTIAL/PLATELET
Basophil #: 0 10*3/uL (ref 0.0–0.1)
Basophil %: 0.8 %
EOS ABS: 0.1 10*3/uL (ref 0.0–0.7)
Eosinophil %: 2.9 %
HCT: 34 % — ABNORMAL LOW (ref 40.0–52.0)
HGB: 10.8 g/dL — ABNORMAL LOW (ref 13.0–18.0)
Lymphocyte #: 0.6 10*3/uL — ABNORMAL LOW (ref 1.0–3.6)
Lymphocyte %: 19.5 %
MCH: 28.5 pg (ref 26.0–34.0)
MCHC: 31.8 g/dL — ABNORMAL LOW (ref 32.0–36.0)
MCV: 90 fL (ref 80–100)
Monocyte #: 0.6 x10 3/mm (ref 0.2–1.0)
Monocyte %: 20.5 %
NEUTROS ABS: 1.7 10*3/uL (ref 1.4–6.5)
Neutrophil %: 56.3 %
Platelet: 124 10*3/uL — ABNORMAL LOW (ref 150–440)
RBC: 3.79 10*6/uL — AB (ref 4.40–5.90)
RDW: 16.4 % — AB (ref 11.5–14.5)
WBC: 3 10*3/uL — ABNORMAL LOW (ref 3.8–10.6)

## 2013-11-05 LAB — CREATININE, SERUM: CREATININE: 1.03 mg/dL (ref 0.60–1.30)

## 2013-11-06 LAB — CREATININE, SERUM: CREATININE: 0.94 mg/dL (ref 0.60–1.30)

## 2013-11-09 LAB — CULTURE, BLOOD (SINGLE)

## 2013-11-12 LAB — HEPATIC FUNCTION PANEL A (ARMC)
AST: 33 U/L (ref 15–37)
Albumin: 3.2 g/dL — ABNORMAL LOW (ref 3.4–5.0)
Alkaline Phosphatase: 88 U/L
Bilirubin, Direct: 0.1 mg/dL (ref 0.0–0.2)
Bilirubin,Total: 0.2 mg/dL (ref 0.2–1.0)
SGPT (ALT): 37 U/L
TOTAL PROTEIN: 6.7 g/dL (ref 6.4–8.2)

## 2013-11-12 LAB — URINALYSIS, COMPLETE
BILIRUBIN, UR: NEGATIVE
Bacteria: NONE SEEN
Blood: NEGATIVE
Glucose,UR: NEGATIVE mg/dL (ref 0–75)
KETONE: NEGATIVE
Leukocyte Esterase: NEGATIVE
NITRITE: NEGATIVE
Ph: 5 (ref 4.5–8.0)
Protein: NEGATIVE
RBC,UR: NONE SEEN /HPF (ref 0–5)
SQUAMOUS EPITHELIAL: NONE SEEN
Specific Gravity: 1.034 (ref 1.003–1.030)
Transitional Epi: <1

## 2013-11-12 LAB — CBC CANCER CENTER
Basophil #: 0 x10 3/mm (ref 0.0–0.1)
Basophil %: 1 %
Eosinophil #: 0.3 x10 3/mm (ref 0.0–0.7)
Eosinophil %: 9.8 %
HCT: 40 % (ref 40.0–52.0)
HGB: 12.9 g/dL — ABNORMAL LOW (ref 13.0–18.0)
LYMPHS ABS: 0.8 x10 3/mm — AB (ref 1.0–3.6)
Lymphocyte %: 25.6 %
MCH: 28.9 pg (ref 26.0–34.0)
MCHC: 32.2 g/dL (ref 32.0–36.0)
MCV: 90 fL (ref 80–100)
Monocyte #: 0.8 x10 3/mm (ref 0.2–1.0)
Monocyte %: 27.4 %
NEUTROS PCT: 36.2 %
Neutrophil #: 1.1 x10 3/mm — ABNORMAL LOW (ref 1.4–6.5)
Platelet: 167 x10 3/mm (ref 150–440)
RBC: 4.46 10*6/uL (ref 4.40–5.90)
RDW: 16.6 % — AB (ref 11.5–14.5)
WBC: 3 x10 3/mm — AB (ref 3.8–10.6)

## 2013-11-12 LAB — CREATININE, SERUM
CREATININE: 1.12 mg/dL (ref 0.60–1.30)
EGFR (African American): >60
EGFR (Non-African Amer.): >60

## 2013-11-13 LAB — URINE CULTURE

## 2013-11-21 ENCOUNTER — Ambulatory Visit: Payer: Self-pay | Admitting: Internal Medicine

## 2013-12-08 ENCOUNTER — Inpatient Hospital Stay: Payer: Self-pay | Admitting: Internal Medicine

## 2013-12-08 LAB — COMPREHENSIVE METABOLIC PANEL
ALBUMIN: 2.7 g/dL — AB (ref 3.4–5.0)
ALK PHOS: 80 U/L
Anion Gap: 6 — ABNORMAL LOW (ref 7–16)
BUN: 12 mg/dL (ref 7–18)
Bilirubin,Total: 0.4 mg/dL (ref 0.2–1.0)
CREATININE: 0.79 mg/dL (ref 0.60–1.30)
Calcium, Total: 8.7 mg/dL (ref 8.5–10.1)
Chloride: 102 mmol/L (ref 98–107)
Co2: 33 mmol/L — ABNORMAL HIGH (ref 21–32)
EGFR (Non-African Amer.): >60
Glucose: 87 mg/dL (ref 65–99)
Osmolality: 280 (ref 275–301)
Potassium: 3.9 mmol/L (ref 3.5–5.1)
SGOT(AST): 19 U/L (ref 15–37)
SGPT (ALT): 21 U/L
SODIUM: 141 mmol/L (ref 136–145)
Total Protein: 6.7 g/dL (ref 6.4–8.2)

## 2013-12-08 LAB — CBC
HCT: 37.3 % — ABNORMAL LOW (ref 40.0–52.0)
HGB: 12.3 g/dL — ABNORMAL LOW (ref 13.0–18.0)
MCH: 28.2 pg (ref 26.0–34.0)
MCHC: 33 g/dL (ref 32.0–36.0)
MCV: 86 fL (ref 80–100)
Platelet: 272 10*3/uL (ref 150–440)
RBC: 4.37 10*6/uL — ABNORMAL LOW (ref 4.40–5.90)
RDW: 15.4 % — AB (ref 11.5–14.5)
WBC: 5.1 10*3/uL (ref 3.8–10.6)

## 2013-12-08 LAB — TROPONIN I: Troponin-I: <0.02 ng/mL

## 2013-12-09 LAB — CBC WITH DIFFERENTIAL/PLATELET
BASOS ABS: 0 10*3/uL (ref 0.0–0.1)
Basophil %: 0.3 %
EOS ABS: 0 10*3/uL (ref 0.0–0.7)
Eosinophil %: 0 %
HCT: 36.9 % — AB (ref 40.0–52.0)
HGB: 12.1 g/dL — AB (ref 13.0–18.0)
LYMPHS ABS: 0.4 10*3/uL — AB (ref 1.0–3.6)
LYMPHS PCT: 9.5 %
MCH: 28.3 pg (ref 26.0–34.0)
MCHC: 32.7 g/dL (ref 32.0–36.0)
MCV: 87 fL (ref 80–100)
MONOS PCT: 1.4 %
Monocyte #: 0.1 x10 3/mm — ABNORMAL LOW (ref 0.2–1.0)
NEUTROS PCT: 88.8 %
Neutrophil #: 4.1 10*3/uL (ref 1.4–6.5)
PLATELETS: 274 10*3/uL (ref 150–440)
RBC: 4.25 10*6/uL — ABNORMAL LOW (ref 4.40–5.90)
RDW: 15.4 % — ABNORMAL HIGH (ref 11.5–14.5)
WBC: 4.6 10*3/uL (ref 3.8–10.6)

## 2013-12-09 LAB — BASIC METABOLIC PANEL
Anion Gap: 6 — ABNORMAL LOW (ref 7–16)
BUN: 13 mg/dL (ref 7–18)
CALCIUM: 9 mg/dL (ref 8.5–10.1)
CHLORIDE: 104 mmol/L (ref 98–107)
CREATININE: 0.79 mg/dL (ref 0.60–1.30)
Co2: 30 mmol/L (ref 21–32)
Glucose: 151 mg/dL — ABNORMAL HIGH (ref 65–99)
Osmolality: 282 (ref 275–301)
Potassium: 4 mmol/L (ref 3.5–5.1)
Sodium: 140 mmol/L (ref 136–145)

## 2013-12-12 LAB — EXPECTORATED SPUTUM ASSESSMENT W GRAM STAIN, RFLX TO RESP C

## 2013-12-13 LAB — CULTURE, BLOOD (SINGLE)

## 2013-12-21 ENCOUNTER — Ambulatory Visit: Payer: Self-pay | Admitting: Internal Medicine

## 2014-01-17 LAB — BASIC METABOLIC PANEL
ANION GAP: 10 (ref 7–16)
BUN: 13 mg/dL (ref 7–18)
CREATININE: 0.95 mg/dL (ref 0.60–1.30)
Calcium, Total: 9 mg/dL (ref 8.5–10.1)
Chloride: 102 mmol/L (ref 98–107)
Co2: 30 mmol/L (ref 21–32)
EGFR (African American): >60
GLUCOSE: 117 mg/dL — AB (ref 65–99)
Osmolality: 284 (ref 275–301)
POTASSIUM: 4 mmol/L (ref 3.5–5.1)
SODIUM: 142 mmol/L (ref 136–145)

## 2014-01-17 LAB — CBC CANCER CENTER
BASOS PCT: 0.7 %
Basophil #: 0 x10 3/mm (ref 0.0–0.1)
EOS PCT: 3.5 %
Eosinophil #: 0.2 x10 3/mm (ref 0.0–0.7)
HCT: 44 % (ref 40.0–52.0)
HGB: 14.3 g/dL (ref 13.0–18.0)
LYMPHS PCT: 21.5 %
Lymphocyte #: 1.3 x10 3/mm (ref 1.0–3.6)
MCH: 27.8 pg (ref 26.0–34.0)
MCHC: 32.6 g/dL (ref 32.0–36.0)
MCV: 85 fL (ref 80–100)
MONO ABS: 0.6 x10 3/mm (ref 0.2–1.0)
Monocyte %: 10.8 %
Neutrophil #: 3.7 x10 3/mm (ref 1.4–6.5)
Neutrophil %: 63.5 %
Platelet: 197 x10 3/mm (ref 150–440)
RBC: 5.15 10*6/uL (ref 4.40–5.90)
RDW: 16.6 % — ABNORMAL HIGH (ref 11.5–14.5)
WBC: 5.8 x10 3/mm (ref 3.8–10.6)

## 2014-01-21 ENCOUNTER — Ambulatory Visit: Payer: Self-pay | Admitting: Internal Medicine

## 2014-02-03 LAB — CBC CANCER CENTER
BASOS PCT: 1 %
Basophil #: 0 x10 3/mm (ref 0.0–0.1)
EOS PCT: 4 %
Eosinophil #: 0.1 x10 3/mm (ref 0.0–0.7)
HCT: 44.7 % (ref 40.0–52.0)
HGB: 14.4 g/dL (ref 13.0–18.0)
Lymphocyte #: 1.2 x10 3/mm (ref 1.0–3.6)
Lymphocyte %: 31.1 %
MCH: 27.6 pg (ref 26.0–34.0)
MCHC: 32.2 g/dL (ref 32.0–36.0)
MCV: 86 fL (ref 80–100)
MONO ABS: 0.5 x10 3/mm (ref 0.2–1.0)
Monocyte %: 12.9 %
NEUTROS PCT: 51 %
Neutrophil #: 1.9 x10 3/mm (ref 1.4–6.5)
Platelet: 184 x10 3/mm (ref 150–440)
RBC: 5.22 10*6/uL (ref 4.40–5.90)
RDW: 17.2 % — ABNORMAL HIGH (ref 11.5–14.5)
WBC: 3.7 x10 3/mm — ABNORMAL LOW (ref 3.8–10.6)

## 2014-02-03 LAB — HEPATIC FUNCTION PANEL A (ARMC)
AST: 17 U/L (ref 15–37)
Albumin: 3.6 g/dL (ref 3.4–5.0)
Alkaline Phosphatase: 74 U/L
Bilirubin, Direct: 0.1 mg/dL (ref 0.0–0.2)
Bilirubin,Total: 0.3 mg/dL (ref 0.2–1.0)
SGPT (ALT): 26 U/L
TOTAL PROTEIN: 6.9 g/dL (ref 6.4–8.2)

## 2014-02-03 LAB — CREATININE, SERUM
CREATININE: 0.91 mg/dL (ref 0.60–1.30)
EGFR (African American): >60
EGFR (Non-African Amer.): >60

## 2014-02-21 ENCOUNTER — Ambulatory Visit: Payer: Self-pay | Admitting: Internal Medicine

## 2014-04-05 ENCOUNTER — Emergency Department: Payer: Self-pay | Admitting: Emergency Medicine

## 2014-04-05 ENCOUNTER — Ambulatory Visit (HOSPITAL_COMMUNITY)
Admission: AD | Admit: 2014-04-05 | Discharge: 2014-04-05 | Disposition: A | Discharge status: Home / Self Care | Payer: Self-pay | Source: Other Acute Inpatient Hospital | Attending: Emergency Medicine | Admitting: Emergency Medicine

## 2014-04-05 DIAGNOSIS — R000 Tachycardia, unspecified: Secondary | ICD-10-CM | POA: Insufficient documentation

## 2014-04-05 DIAGNOSIS — X58XXXA Exposure to other specified factors, initial encounter: Secondary | ICD-10-CM | POA: Insufficient documentation

## 2014-04-05 DIAGNOSIS — S32009A Unspecified fracture of unspecified lumbar vertebra, initial encounter for closed fracture: Secondary | ICD-10-CM | POA: Insufficient documentation

## 2014-04-05 LAB — CREATININE, SERUM: Creatine, Serum: 1.41

## 2014-05-06 ENCOUNTER — Other Ambulatory Visit: Payer: Self-pay | Admitting: Internal Medicine

## 2014-05-06 DIAGNOSIS — C3490 Malignant neoplasm of unspecified part of unspecified bronchus or lung: Secondary | ICD-10-CM

## 2014-05-14 NOTE — Discharge Summary (Signed)
PATIENT NAME:  Darryl Hatfield, Darryl Hatfield MR#:  [id number] DATE OF BIRTH:  Apr 23, 1960  DATE OF ADMISSION:  08/06/2013 DATE OF DISCHARGE:  08/07/2013  ADMITTING PHYSICIAN: Dr. Margaretmary Eddy.   DISCHARGING PHYSICIAN: Gladstone Lighter, M.D.   PRIMARY ONCOLOGIST:  Dr. Ma Hillock.  PRIMARY CARE PHYSICIAN:  Dr. Dema Severin.  DISCHARGE DIAGNOSES: 1.  Chest pain secondary to musculoskeletal origin.  2.  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  3.  Chronic obstructive pulmonary disease.  4.  Left upper lobe cavitary mass, could be pneumonia.  DISCHARGE HOME MEDICATIONS:  1.  Percocet 325 one to 2 tablets by mouth every 4-6 hours as needed for pain.  2.  Decadron 4 mg- 3 tablets at 10:00 p.m. Take 3 tablets at 6:00 a.m. and also during chemotherapy on the day of chemotherapy.  3.  Prochlorperazine 10 mg 3 times a day as needed for nausea and vomiting.  4.  Xanax 1 mg 3 times a day as needed for anxiety.  5.  Bupropion 200 mg 1 tablet p.o. b.i.d.  6.  Nicotine patch transdermal 21 mg daily. 7.  Spiriva 2 mcg inhalation capsule daily.  8.  DuoNeb with albuterol ipratropium 3-4 mL 4 times a day as needed for shortness of breath and wheezing.  9.  Levaquin 500 mg p.o. daily for 7 days.  DISCHARGE HOME OXYGEN: None.   DISCHARGE DIET: Regular diet.   DISCHARGE ACTIVITY: As tolerated.  FOLLOWUP INSTRUCTIONS:  1.  Follow up with Dr. Ma Hillock as scheduled next week for chemotherapy.  2.  Follow up with radiation oncology for radiation as per schedule next week.   LABS AND IMAGING STUDIES PRIOR TO DISCHARGE: WBC 5.0, hemoglobin 12.1, hematocrit 37.2, platelet count is 203.   Sodium 135, potassium 4.1, chloride 99, bicarbonate 31, BUN 16, creatinine 0.72, glucose 80 and calcium of 8.7.  LDL cholesterol 112, HDL is 55, total cholesterol 191 and serum triglycerides of 122. Magnesium is 1.8.   Ultrasound Dopplers of bilateral lower extremities  showing no evidence of any DVT in both lower extremities.   Myoview scan showing no significant wall motion abnormality, low risk scan.  Estimated ejection fraction is 70%. No EKG changes concerning for ischemia.  Left ventricular global dysfunction was normal.   CT of the chest, per PE protocol done on admission showing no evidence of PE, right side lower lymphadenopathy note, and spiculated left upper lobe mass with regular mass with central cavitation, new from prior PET scan noted.  It could be pneumonia versus neoplasm.    BRIEF HOSPITAL COURSE: Darryl Hatfield is a 54 year old Caucasian male with past medical history significant for COPD, history of smoking, just quit about 3 weeks ago since the diagnosis of his squamous cell carcinoma of the right lung presents to the hospital secondary to chest pain.   1.  Chest pain. Could be related to his cancer or musculoskeletal pain.  A CT chest did not show any PE, confirmed a mass; however, he developed a new lesion in the left upper lobe which is new, which is really at the sharp pains too.  It could be pneumonia versus lung cancer, but I will discharge him on antibiotics at this time and he can follow up with Dr. Ma Hillock as an outpatient.   2.  He also Myoview done which showed normal ejection fraction, though echo showed his ejection fraction was probably 40%, seen by cardiologist.  Since Myoview was negative from  a cardiac standpoint they recommended discharge.  The patient is currently chest pain-free.   3.  Stage IIIB lung cancer.  Follows with Dr. Ma Hillock, has been started on chemotherapy and radiation. We will continue that next week as prior scheduled.   4.  COPD, stable on DuoNeb and inhalers at home, which he will continue.   His course has been otherwise uneventful in the hospital.   DISCHARGE CONDITION: Stable.   DISCHARGE DISPOSITION: Home.   TIME SPENT ON DISCHARGE: 40 minutes.   ____________________________ Gladstone Lighter,  MD rk:ts D: 08/07/2013 07:27:21 ET T: 08/07/2013 11:09:54 ET JOB#: [id number]  cc: Gladstone Lighter, MD, <Dictator> Sandeep R. Ma Hillock, MD Gladstone Lighter MD ELECTRONICALLY SIGNED 08/11/2013 13:30

## 2014-05-14 NOTE — Consult Note (Signed)
General Aspect Primary Cardiologist: Dr. Rockey Situ  54 y/o M with h/o COPD, end stage IIB lung cancer currently on chemo s/p radiation tx presented to [facility] ED today with intermittent episodes of chest pain and dizziness since 08/03/2013 and right lateral leg pain since 08/04/2013.  _________________________________   Present Illness 54 y/o M with the above problem presented to [facility] ED 08/04/2013 with intermittent episodes of chest pain, dizziness since 08/03/2013 and a secondary complaint of right lateral leg pain since 08/05/2013.   He initially presented to the cancer center with the above complaint and had an EKG and echo done by them on 08/04/2013. Echo with EF 35-40%, Moderately decreased global left ventricular systolic function, moderately increased left ventricular internal cavity size, mild mitral valve regurgitation, mild tricuspid regurgitation.  Chest pain was retrosternal and worsening, prompting him to come in for evaluation. Troponins have been negative x 3. EKG with sinus tach, 107, IRBBB. Vitals this afternoon have his tachycardia resolved. He has not had any further chest pain or dizziness since the worsening pain that brought him here on the 15th.  Patient denies having had a cath done in [facility]. He also denies ever having had a cardiac cath done. I looked into [facility] records and did not see any cath report. He denies any prior stress tests.   He describes his right lateral leg pain as an ache that is only along the outside that goes from his toes to his thigh. No injury or trauma. No swelling, erythema, or wounds. Pain improves with cancer treatment. [place] without evidence of DVT bilaterally.   Physical Exam:  GEN well developed, well nourished, no acute distress   HEENT PERRL   NECK supple   RESP normal resp effort  clear BS   CARD Regular rate and rhythm  No murmur   ABD denies tenderness  soft  normal BS   EXTR positive edema, calves supple, without swelling,  erythema, TTP, or cording, and appearing grossly equal in diameter   SKIN normal to palpation   NEURO cranial nerves intact   PSYCH alert, A+O to time, place, person, good insight   Review of Systems:  General: No Complaints   Skin: No Complaints   ENT: No Complaints   Eyes: No Complaints   Neck: No Complaints   Respiratory: No Complaints   Cardiovascular: Chest pain or discomfort  no chest pain currently   Gastrointestinal: Nausea   Genitourinary: No Complaints   Vascular: Leg cramping  right lateral leg   Musculoskeletal: No Complaints   Neurologic: No Complaints   Hematologic: No Complaints   Endocrine: No Complaints   Psychiatric: No Complaints   Review of Systems: All other systems were reviewed and found to be negative   Medications/Allergies Reviewed Medications/Allergies reviewed   Family & Social History:  Family and Social History:  Family History Coronary Artery Disease   Social History negative ETOH, negative Illicit drugs   + Tobacco Prior (greater than 1 year)  recently quit, 40 pack year history   Place of Living Home     lung cancer:    heart disease:    lung mass:    fatigue:    COPD:    Denies medical history:    Denies surgical history.:   Home Medications: Medication Instructions Status  Percocet 5/325 1-2 tablets by mouth every 4-6 hours as needed for pain Active  dexamethasone 4 mg oral tablet Take 3 tablets (12 mg) at 10PM  and take  3 tablets (12 mg) at 6AM  the day of each Taxol chemotherapy  (first dose of Taxol planned on 08/02/13) Active  prochlorperazine 10 mg oral tablet 1 tab(s) orally 3 times a day as needed for nausea or vomiting Active  ALPRAZolam 1 mg oral tablet 1 tab(s) orally 3 times a day as needed for anxiety Active  buPROPion 200 mg/12 hours oral tablet, extended release 1 tab(s) orally 2 times a day Active  albuterol-ipratropium 2.5 mg-0.5 mg/3 mL inhalation solution 4 milliliter(s) inhaled 4 times a  day, As Needed - for Shortness of Breath - for Wheezing  Active  tiotropium 18 mcg inhalation capsule 1 cap(s) inhaled once a day Active  nicotine 21 mg/24 hr transdermal film, extended release 1 patch transdermal once a day Active   Lab Results:  LabObservation:  17-Jul-15 12:32   OBSERVATION Reason for Test Pain  Hepatic:  16-Jul-15 22:19   Bilirubin, Total 0.4  Alkaline Phosphatase 79 (45-117 NOTE: New Reference Range 12/11/12)  SGPT (ALT) 42  SGOT (AST) 28  Total Protein, Serum 7.0  Albumin, Serum  3.3  Cardiology:  17-Jul-15 10:05   Protocol LEXISCAN  Max Work Load 10  Total Exercise Time 60  Max Diastolic BP 76  Max Heart Rate 113  Max Predicted Heart Rate 166  Reason For Termination per lexiscan protocol  ECG interpretation Confirmed by OVERREAD, NOT ([id number]), editor PEARSON, BARBARA ([id number]) on 08/06/2013 2:29:20 PM  Routine Chem:  16-Jul-15 22:19   Glucose, Serum 84  BUN  21  Creatinine (comp) 0.88  Sodium, Serum 138  Potassium, Serum 3.8  Chloride, Serum 99  CO2, Serum  35  Calcium (Total), Serum 8.9  Osmolality (calc) 278  eGFR (African American) >60  eGFR (Non-African American) >60 (eGFR values <56m/min/1.73 m2 may be an indication of chronic kidney disease (CKD). Calculated eGFR is useful in patients with stable renal function. The eGFR calculation will not be reliable in acutely ill patients when serum creatinine is changing rapidly. It is not useful in  patients on dialysis. The eGFR calculation may not be applicable to patients at the low and high extremes of body sizes, pregnant women, and vegetarians.)  Anion Gap  4  Cardiac:  16-Jul-15 22:19   Troponin I < 0.02 (0.00-0.05 0.05 ng/mL or less: NEGATIVE  Repeat testing in 3-6 hrs  if clinically indicated. >0.05 ng/mL: POTENTIAL  MYOCARDIAL INJURY. Repeat  testing in 3-6 hrs if  clinically indicated. NOTE: An increase or decrease  of 30% or more on serial  testing suggests a  clinically important  change)  17-Jul-15 03:22   Troponin I < 0.02 (0.00-0.05 0.05 ng/mL or less: NEGATIVE  Repeat testing in 3-6 hrs  if clinically indicated. >0.05 ng/mL: POTENTIAL  MYOCARDIAL INJURY. Repeat  testing in 3-6 hrs if  clinically indicated. NOTE: An increase or decrease  of 30% or more on serial  testing suggests a  clinically important change)  CK, Total  26 (39-308 NOTE: NEW REFERENCE RANGE  02/22/2013)    06:37   Troponin I < 0.02 (0.00-0.05 0.05 ng/mL or less: NEGATIVE  Repeat testing in 3-6 hrs  if clinically indicated. >0.05 ng/mL: POTENTIAL  MYOCARDIAL INJURY. Repeat  testing in 3-6 hrs if  clinically indicated. NOTE: An increase or decrease  of 30% or more on serial  testing suggests a  clinically important change)  CK, Total  28 (39-308 NOTE: NEW REFERENCE RANGE  02/22/2013)  Routine UA:  16-Jul-15 21:39   Color (UA) Yellow  Clarity (UA) Cloudy  Glucose (UA) Negative  Bilirubin (UA) Negative  Ketones (UA) Negative  Specific Gravity (UA) 1.013  Blood (UA) Negative  pH (UA) 7.0  Protein (UA) Negative  Nitrite (UA) Negative  Leukocyte Esterase (UA) Negative (Result(s) reported on 05 Aug 2013 at 09:58PM.)  RBC (UA) 1 /HPF  WBC (UA) 3 /HPF  Bacteria (UA) TRACE  Epithelial Cells (UA) <1 /HPF (Result(s) reported on 05 Aug 2013 at 09:58PM.)  Routine Hem:  16-Jul-15 22:19   WBC (CBC) 5.7  RBC (CBC)  4.24  Hemoglobin (CBC)  12.3  Hematocrit (CBC)  37.8  Platelet Count (CBC) 214 (Result(s) reported on 05 Aug 2013 at 10:30PM.)  MCV 89  MCH 29.0  MCHC 32.5  RDW  15.6   EKG:  EKG Interp. by me   Interpretation Sinus tachycardia, 107, IRBBB   Radiology Results: [place]:    17-Jul-15 12:32, [place] Color Flow Doppler Low Extrem Bilat (Legs)  [place] Color Flow Doppler Low Extrem Bilat (Legs)   REASON FOR EXAM:    Pain  COMMENTS:       PROCEDURE: [place]  - US DOPPLER LOW EXTR BILATERAL  - Aug 06 2013 12:32PM     CLINICAL DATA:  Bilateral lower extremity pain. History of  lung  cancer. History of smoking. History of lower extremity varicosities.  Evaluate for DVT.    EXAM:  BILATERAL LOWER EXTREMITY VENOUS DOPPLER ULTRASOUND    TECHNIQUE:  Gray-scale sonography with graded compression, as well as color  Doppler and duplex ultrasound were performed to evaluate the lower  extremity deep venous systems from the level of the common femoral  vein and including the common femoral, femoral, profunda femoral,  popliteal and calf veins including the posterior tibial, peroneal  and gastrocnemius veins when visible. The superficial great  saphenous vein was also interrogated. Spectral Doppler was utilized  to evaluate flow at rest and with distal augmentation maneuvers in  the common femoral, femoral and popliteal veins.    COMPARISON:  None.    FINDINGS:  RIGHT LOWER EXTREMITY    Common FemoralVein: No evidence of thrombus. Normal  compressibility, respiratory phasicity and response to augmentation.  Saphenofemoral Junction: No evidence of thrombus. Normal  compressibility and flow on color Doppler imaging.    Profunda Femoral Vein: No evidence of thrombus. Normal  compressibility and flow on color Doppler imaging.    Femoral Vein: No evidence of thrombus. Normal compressibility,  respiratory phasicity and response to augmentation.    Popliteal Vein: No evidence of thrombus. Normal compressibility,  respiratory phasicity and response to augmentation.    Calf Veins: No evidence of thrombus. Normal compressibility and flow  on color Doppler imaging.  Superficial Great Saphenous Vein: No evidence of thrombus. Normal  compressibility and flow on color Doppler imaging.    Venous Reflux:  None.    Other Findings:  None.    LEFT LOWER EXTREMITY    Common Femoral Vein: No evidence of thrombus. Normal  compressibility, respiratory phasicity and response to augmentation.    Saphenofemoral Junction: No evidence of thrombus. Normal  compressibility and  flow on color Doppler imaging.  Profunda Femoral Vein: No evidence of thrombus. Normal  compressibility and flow on color Doppler imaging.    Femoral Vein: No evidence of thrombus. Normal compressibility,  respiratory phasicity and response to augmentation.    Popliteal Vein: No evidence of thrombus. Normal compressibility,  respiratory phasicity and response to augmentation.    Calf Veins: No evidence of thrombus. Normal compressibility  and flow  on color Doppler imaging.    Superficial Great Saphenous Vein: No evidence of thrombus. Normal  compressibility and flow on color Doppler imaging.  Venous Reflux:  None.    Other Findings:  None.     IMPRESSION:  No evidence of DVT within either lower extremity.      Electronically Signed    By: Sandi Mariscal M.D.    On: 08/06/2013 12:35         Verified By: Aileen Fass, M.D.,  Cardiology:    17-Jul-15 10:05, Stress Test For Nuclear  Stress Test     No Known Allergies:   Vital Signs/Nurse's Notes: **Vital Signs.:   17-Jul-15 08:01  Vital Signs Type Routine  Temperature Temperature (F) 98.5  Celsius 36.9  Pulse Pulse 98  Respirations Respirations 20  Systolic BP Systolic BP 810  Diastolic BP (mmHg) Diastolic BP (mmHg) 72  Mean BP 94  Pulse Ox % Pulse Ox % 91  Pulse Ox Activity Level  At rest  Oxygen Delivery Room Air/ 21 %    16:28  Vital Signs Type Routine  Temperature Temperature (F) 97.7  Celsius 36.5  Temperature Source oral  Pulse Pulse 109  Respirations Respirations 18  Systolic BP Systolic BP 175  Diastolic BP (mmHg) Diastolic BP (mmHg) 78  Mean BP 92  Pulse Ox % Pulse Ox % 90  Pulse Ox Activity Level  With exertion  Oxygen Delivery Room Air/ 21 %    Impression 54 y/o M with COPD, end stage IIB lung cancer currently on chemo s/p radiation tx presented to [facility] ED today with intermittent episodes of chest pain and dizziness since 08/03/2013 and right lateral leg pain since 08/04/2013.   1. Atypical chest  pain: He has had 3 negative troponins. Recent echo 08/04/2013 (during the acute onset of the above) with EF 35-40%, Moderately decreased global left ventricular systolic function. Has Lexiscan pending from today. If this is low risk patient may be able to be discharged once Dr. Fletcher Anon sees him. Continue current medications.   2. Recent diagnosis of stage IIIB lung cancer. Continue chemotherapy as scheduled. Follow up with oncology as an outpatient as recommended.   3. Chronic history of chronic obstructive pulmonary disease, currently with no exacerbation.  4. Right lateral leg pain: DVT has been ruled out. Follow up with PCP as outpatient.   Electronic Signatures for Addendum Section:  Kathlyn Sacramento (MD) (Signed Addendum 17-Jul-15 18:54)  The patient was seen and examined. Agree with the above. He has known history of lung cancer. he presented with sharp chest pain at rest. No murmurs or rubs by exam. ECG and TNI are fine. CTA chest showed no PE.  Nuclear stress test showed no ischemia with normal EF. No further cardiac work is recommended. chest pain could be related to lung cancer.   Electronic Signatures: Kathlyn Sacramento (MD)  (Signed 17-Jul-15 18:54)  Co-Signer: General Aspect/Present Illness, History and Physical Exam, Review of System, Family & Social History, Past Medical History, Home Medications, Labs, EKG , Radiology, Allergies, Vital Signs/Nurse's Notes, Impression/Plan Idolina Primer, Ryan M (PA-C)  (Signed 17-Jul-15 17:41)  Authored: General Aspect/Present Illness, History and Physical Exam, Review of System, Family & Social History, Past Medical History, Home Medications, Labs, EKG , Radiology, Allergies, Vital Signs/Nurse's Notes, Impression/Plan   Last Updated: 17-Jul-15 18:54 by Kathlyn Sacramento (MD)

## 2014-05-14 NOTE — Op Note (Signed)
PATIENT NAME:  Darryl Hatfield, Darryl Hatfield MR#:  [id number] DATE OF BIRTH:  1960-05-15  DATE OF PROCEDURE:  07/12/2013  SURGEON: Louis Matte, M.D.   ASSISTANT: None.   PREOPERATIVE DIAGNOSIS: Iatrogenic right-sided pneumothorax.   POSTOPERATIVE DIAGNOSIS:  Iatrogenic right-sided pneumothorax.   OPERATION PERFORMED: Insertion of a 14-French percutaneous chest tube.   INDICATIONS FOR PROCEDURE: Darryl Hatfield is a 54 year old gentleman who underwent a Port-A-Cath insertion earlier today and on his postoperative chest x-ray was found to have a pneumothorax. The indications and risks of chest tube insertion were explained to the patient, who gave his informed consent.   DESCRIPTION OF PROCEDURE: With the patient lying in bed in the recovery room, the right axilla was prepped and draped in the usual sterile fashion. A skin wheal was created in approximately the 5th intercostal space. The pleural space was accessed through a hollow needle and a wire was inserted through the needle into the pleural space and was ultimately dilated until it would accommodate a 14-French Thal-Quick chest tube. This was inserted and then promptly hooked to suction. The chest tube was secured to the chest wall with silk. The dressings were applied and the chest x-ray showed the lung to be fully expanded with the tube in good position. The patient tolerated the procedure well.   ____________________________ Darryl Hatfield. Darryl Bi, MD teo:cs D: 07/12/2013 15:58:38 ET T: 07/12/2013 19:45:45 ET JOB#: [id number]  cc: Christia Reading E. Darryl Bi, MD, <Dictator> Louis Matte MD ELECTRONICALLY SIGNED 07/23/2013 11:58

## 2014-05-14 NOTE — Discharge Summary (Signed)
PATIENT NAME:  Darryl Hatfield, Darryl Hatfield MR#:  [id number] DATE OF BIRTH:  12/16/1960  DATE OF ADMISSION:  12/08/2013 DATE OF DISCHARGE:  12/11/2013  ADMITTING PHYSICIAN: Theodoro Grist, MD.   DISCHARGING PHYSICIAN: Gladstone Lighter, MD.   PRIMARY CARE PHYSICIAN/PRIMARY ONCOLOGIST: Darryl Alf, MD.   DISCHARGE DIAGNOSES: 1. Right upper lobe pneumonia.  2. Squamous cell carcinoma of the right lung, stage IIIa; finished congruent chemotherapy and radiation.  3. Chronic obstructive pulmonary disease exacerbation.  4. Remote history of smoking.  5. History of benign parotid mass.  6. Generalized anxiety disorder.  7. Peripheral neuropathy.   DISCHARGE MEDICATIONS: 1. DuoNebs with albuterol ipratropium 3 mL 4 times a day as needed for wheezing.  2. Bupropion 200 mg 1 tablet p.o. b.i.d.  3. Prochlorperazine 10 mg 3 times a day p.r.n. for nausea and vomiting.  4. Advair 250/50, one puff b.i.d.  5. Alprazolam 1 mg 3 times a day as needed for anxiety.  6. Gabapentin 100 mg p.o. 3 times a day.  7. Oxycodone 5 mg 1 to 2 tablets every 4 hours as needed for pain.  8. Spiriva inhalation capsule daily.  9. Prednisone taper over 5 days.  10. Tessalon Perles 100 mg every 6 hours for 5 days for cough.  11. Levaquin 500 mg p.o. daily for 6 days.  12. Robitussin-AC 5 mL every 6 hours as needed for cough.   DISCHARGE DIET: Regular diet.   DISCHARGE ACTIVITY: As tolerated.   FOLLOWUP INSTRUCTIONS: 1. PCP followup in 1 to 2 weeks.  2. Oncology followup as per prior schedule.  3. Referral made for pulmonary rehab as an outpatient.   LABORATORY AND IMAGING STUDIES PRIOR TO DISCHARGE:  1. WBC 4.6, hemoglobin 12.2, hematocrit 36.9, platelet count 274,000. Sodium 140, potassium 4.0, chloride 104, bicarbonate 30, BUN 13, creatinine 0.79, glucose 151 and calcium of 9.0.  2. Sputum cultures growing gram-negative rods.  3. ABG showing pH of 7.42, pCO2 of 49, pO2 of 57, bicarbonate 32, saturations of 91% on 2  liters oxygen.  4. Blood cultures remain negative.  5. Chest x-ray is showing spiculated right upper lobe nodular, reticular markings around. Could be postobstructive pneumonitis or lymphangitic spread.   BRIEF HOSPITAL COURSE: Darryl Hatfield is a 54 year old male with a past medical history significant for COPD, history of smoking, squamous cell carcinoma of the right lung status post chemoradiation, presents to the hospital secondary to worsening shortness of breath and also wheezing. Chest x-ray showed possible right upper lobe pneumonia.  1. COPD exacerbation and right upper lobe pneumonia. Blood cultures remain negative. Sputum cultures growing gram-negative rods. The patient was on Rocephin and Zithromax in the hospital. Discharged on Levaquin. Also an IV steroids, nebulizers and inhalers which are being continued. Steroids are being changed over to oral steroids. His saturations have remained steady in the hospital, though he initially he required 3 liters of oxygen. At the time of discharge, his saturations on room air at rest were about 90% to 93% and same at ambulation as well, dropped down the most to 89%. He is not being qualified for home oxygen at this time, but with his chronic underlying conditions of COPD, lung cancer treatment, he might need home oxygen at some point.  2. For his lung cancer, the patient finished chemoradiation and will follow up with Dr. Ma Hatfield as prior schedule.  3. Anxiety disorder. Continue his home medications without any changes.  4. His course has been otherwise uneventful in the hospital.   DISCHARGE CONDITION:  Guarded.   DISCHARGE DISPOSITION: Home. Pulmonary outpatient rehabilitation referral has been given as recommended by physical therapy.   TIME SPENT ON DISCHARGE: 45 minutes.     ____________________________ Gladstone Lighter, MD rk:TT D: 12/11/2013 14:22:01 ET T: 12/11/2013 19:36:41 ET JOB#: [id number]  cc: Gladstone Lighter, MD,  <Dictator> Gladstone Lighter MD ELECTRONICALLY SIGNED 01/05/2014 14:47

## 2014-05-14 NOTE — Consult Note (Signed)
PATIENT NAME:  Darryl Hatfield, Darryl Hatfield MR#:  [id number] DATE OF BIRTH:  04/10/1960  DATE OF CONSULTATION:  07/16/2013  REFERRING PHYSICIAN:  Dr. Genevive Bi CONSULTING PHYSICIAN:  Sital P. Benjie Karvonen, MD  PRIMARY CARE PHYSICIAN: [facility]  REASON FOR REQUEST: Pneumonia.   IMPRESSION: 1.  Postoperative pneumonia, likely as a result from atelectasis.  2.  Chronic obstructive pulmonary disease, stable.  3.  Port placement with pneumothorax.  4.  Anxiety.  5.  History of tobacco use, now in remission.   PLAN: 1.  Zosyn will be fine to use for now.  2.  Incentive spirometer every 3 hours.  3.  Chest tube as per Dr. Genevive Bi.  4.  Chronic obstructive pulmonary disease is stable and no need for any steroids at this time. Would continue inhalers.  5.  Continue nicotine patch.   HISTORY OF PRESENT ILLNESS: This is a very pleasant 54 year old male with a history of COPD and anxiety who presented for port placement and unfortunately developed a pneumothorax as a result. Hospitalist service was consulted due to pneumonia now seen on the x-ray.   REVIEW OF SYSTEMS: CONSTITUTIONAL: No fever, chills, fatigue, weakness.  EYES: No blurred or double vision.  EARS, NOSE, THROAT: No ear pain, hearing loss, seasonal allergies, or post nasal drip. RESPIRATORY: No cough, wheezing, or hemoptysis. Positive COPD. CARDIOVASCULAR: No chest pain, orthopnea, edema, arrhythmia, dyspnea on exertion or palpitations.  GASTROINTESTINAL: No nausea, vomiting, diarrhea, abdominal pain, melena or ulcers. GENITOURINARY: No dysuria or hematuria. ENDOCRINE: No polyuria or polydipsia. HEME AND LYMPH: No anemia or easy bruising. SKIN: No rash or lesions.  MUSCULOSKELETAL: No limited activity. NEUROLOGIC: No history of CVA, TIA. PSYCHIATRIC: Positive anxiety.   PAST MEDICAL HISTORY:  1.  COPD. 2.  Anxiety.  3.  Tobacco dependence, in remission.  PAST SURGICAL HISTORY: None.   SOCIAL HISTORY: The patient quit smoking in June of  2015. No alcohol or IV drug usage.  FAMILY HISTORY: Positive for CAD.   ALLERGIES: No known drug allergies.   MEDICATIONS:  1.  Wellbutrin 150 mg p.o. b.i.d.  2.  Advair Diskus 1 puff b.i.d.  3.  Spiriva 1 inhalation daily.  4.  Nicotine patch 21 mg per 24 hours.   PHYSICAL EXAMINATION: VITAL SIGNS: Temperature 97.6, pulse 93 to 100, respirations 18, blood pressure 126/78, 94% on room air.  GENERAL: The patient alert and oriented, not in acute distress.  HEENT: Head is atraumatic. Pupils are round. Anicteric sclerae. Mucous membranes are moist. Oropharynx is clear.   NECK: Supple without JVD, carotid bruit, or enlarged thyroid. CARDIOVASCULAR: Regular rate and rhythm. No murmurs, gallops, or rubs. PMI is non-displaced.  LUNGS: Clear to auscultation without crackles, rales, rhonchi or wheezing. Normal to percussion.  ABDOMEN: Bowel sounds are positive. Nontender, nondistended. No hepatosplenomegaly.  EXTREMITIES: No clubbing, cyanosis or edema.  NEUROLOGIC: Cranial nerves II through XII are intact without any focal deficit.   DIAGNOSTIC DATA: Chest x-ray is consistent with pneumonia.   Thank you for allowing [place] to participate in the care of the patient. We will continue to follow.  TIME SPENT ON CONSULT: 50 minutes. ____________________________ Donell Beers. Benjie Karvonen, MD spm:sb D: 07/16/2013 10:38:01 ET T: 07/16/2013 10:55:35 ET JOB#: [id number]  cc: Sital P. Benjie Karvonen, MD, <Dictator> Donell Beers MODY MD ELECTRONICALLY SIGNED 07/16/2013 13:57

## 2014-05-14 NOTE — Op Note (Signed)
PATIENT NAME:  Darryl Hatfield, Darryl Hatfield MR#:  [id number] DATE OF BIRTH:  03/05/1960  DATE OF PROCEDURE:  07/12/2013  SURGEON:  Louis Matte, M.D.   ASSISTANT:  None.   PREOPERATIVE DIAGNOSIS:  Lung cancer.   POSTOPERATIVE DIAGNOSIS:  Lung cancer.  OPERATION PERFORMED:  Ultrasound-guided right internal jugular vein Port-A-Cath.   INDICATIONS FOR PROCEDURE:  [name] is a 53 year old gentleman with a history of unresectable lung cancer and requires a Port-A-Cath for chemotherapy. The indications and risks were explained to the patient, who gave his informed consent.   DESCRIPTION OF PROCEDURE: The patient was brought to the operating suite and placed in the supine position. Laryngeal mask airway anesthesia was given. The right internal jugular vein was ultrasounded and was accessible. The patient was then prepped and draped in the usual sterile fashion. Again, using the ultrasound, a needle was advanced into the vein. The first attempt did not return any blood, but the second attempt did and a wire was placed into the right side of the heart. Under fluoroscopic guidance, the catheter was seen to be on the correct side. We then made a small skin incision high on the chest wall for a Port-A-Cath. A Port-A-Cath pocket was then created on the prepectoral fascia. The catheter was then tunneled from our port site to the internal jugular vein puncture site and the catheter was then placed through a peel-away sheath into the right side of the heart. Initially, the catheter coiled back on itself. It was then straightened and advanced into proper position. The catheter irrigated and flushed quite nicely. Fluoroscopy of the chest did not reveal any complications and the catheter was then assembled properly, flushed and then sewn to the chest wall with interrupted 2-0 Prolene at 3 of the 4 quadrant sutures. The catheter was again flushed and found to be in good position and the wounds were closed. The next site was  closed with a single nylon and the subcutaneous tissues and skin were closed on the port site with Vicryl and nylon respectively. The patient tolerated the procedure well and was taken to the recovery room in stable condition.     ____________________________ Lew Dawes Genevive Bi, MD teo:dmm D: 07/12/2013 15:56:39 ET T: 07/12/2013 19:46:58 ET JOB#: [id number]  cc: Christia Reading E. Genevive Bi, MD, <Dictator> Louis Matte MD ELECTRONICALLY SIGNED 07/23/2013 11:58

## 2014-05-14 NOTE — Discharge Summary (Signed)
PATIENT NAME:  Darryl Hatfield, BYAS MR#:  [id number] DATE OF BIRTH:  03/24/1960  DATE OF ADMISSION:  07/12/2013 DATE OF DISCHARGE:  07/19/2013  ADMITTING DIAGNOSIS: Iatrogenic right-sided pneumothorax.   DISCHARGE DIAGNOSIS: Iatrogenic right-sided pneumothorax.  OPERATION PERFORMED: Insertion of right internal jugular Port-A-Cath and right chest tube.   HOSPITAL COURSE: Mr. Weyman Bogdon is a 54 year old gentleman with a history of unresectable lung cancer who was brought to the Operating Room on 07/12/2013, where he underwent a Port-A-Cath insertion. His postoperative course was complicated by an iatrogenic pneumothorax on the right, which was treated with chest tube insertion. The patient was admitted to the hospital, where, over the next several days, his air leak ultimately resolved and the chest tube was removed on 07/19/2013. His chest x-ray showed no evidence of a pneumothorax and when he was discharged to home, he was given a prescription for Percocet 5/325 mg to take as needed for pain. His additional medications at the time of discharge included Wellbutrin 150 mg every 12 hours, albuterol, Advair, nicotine 21 mg transdermal patch per day, alprazolam 1 mg 3 times a day, and albuterol-ipratropium 4 times a day. He will follow up with Dr. Genevive Bi in the [place] in 1 week. At the time of discharge, all of his wounds were healing as expected without any signs of infection.    ____________________________ Lew Dawes Genevive Bi, MD teo:lm D: 07/23/2013 11:56:00 ET T: 07/23/2013 22:33:00 ET JOB#: [id number]  cc: Lew Dawes. Genevive Bi, MD, <Dictator> Louis Matte MD ELECTRONICALLY SIGNED 08/05/2013 12:18

## 2014-05-14 NOTE — H&P (Signed)
PATIENT NAME:  Darryl Hatfield, Darryl Hatfield MR#:  [id number] DATE OF BIRTH:  June 23, 1960  DATE OF ADMISSION:  11/04/2013  PRIMARY ONCOLOGIST:  Leia Alf, MD  PRIMARY CARE PHYSICIAN:  Currently does not have any primary care provider.  REFERRING PHYSICIAN:  Yetta Numbers. Karma Greaser, MD   ADMITTING PHYSICIAN:  Juluis Mire, MD  CHIEF COMPLAINT:   1.  Cough with greenish productive sputum ongoing for the past 2 to 3 days.  2.  General weakness, chills, with a suboptimal temperature.   HISTORY OF PRESENT ILLNESS:  A 54 year old Caucasian male with a medical history of right-sided lung cancer on chemotherapy, who came to the Emergency Room with complaints of ongoing cough with green sputum production ongoing for the past 2 to 3 days. The patient also mentioned that he has been feeling weak with cold and having chills for the past 2 to 3 days, and on checking his temperature, he was noted to have a temperature of around 55 degrees Fahrenheit. He was also noted to have mild intermittent shortness of breath. He called his oncologist on-call, and upon his recommendation, he came to the Emergency Room for further evaluation. He states that his recent chemotherapy dose was given on 10/25/2013.   In the Emergency Room, the patient was evaluated by the ED physician and was found to be with generalized weakness and his lab work revealed white blood cell count of 3.6 with an absolute neutrophil count of 2.2, and a chest x-ray was unremarkable without any acute infiltrates. In view of his active lung cancer on chemotherapy with neutropenia and febrile episodes with greenish productive sputum, the hospitalist service was consulted for further evaluation and management and further admission. The patient had blood and sputum cultures done in the Emergency Room and was started on broad-spectrum antibiotics, IV vancomycin and cefepime by the ED physician. Currently, he is also receiving oxygen supplementation through nasal cannula,  and he stated that he is feeling slightly better but still feels cold and weak.   PAST MEDICAL HISTORY:   1.  Right-sided lung cancer diagnosed in May 2015 and currently undergoing chemotherapy under care of oncologist Dr. Ma Hillock.  2.  COPD on nebulizers and inhalers at home.  3.  History of generalized anxiety disorder.  4.  History of neuropathy, left upper extremity.   PAST SURGICAL HISTORY:  None.   HOME MEDICATIONS:  Wellbutrin 200 mg extended-release tablet 1 tablet twice a day, tiotropium 1 capsule once a day, gabapentin 100 mg 2 capsules 3 times a day, alprazolam 1 mg 3 times a day, albuterol ipratropium nebulizer as needed 4 times a day, Advair Diskus 250/50 one puff twice a day, prochlorperazine as needed for nausea, Percocet as needed for pain.   ALLERGIES:  No known drug allergies.   SOCIAL HISTORY:  He is a retired Engineer, manufacturing systems. Ex-smoker, smoked for about 40 years, quit in May 2015. Occasional alcohol usage in the past, but quit in May 2015. Denies any history of substance abuse.   FAMILY HISTORY:  Significant for mother's side with heart disease, COPD, and abdominal aortic aneurysm and his father's side significant for some cancers.   REVIEW OF SYSTEMS:  CONSTITUTIONAL:  Generalized weakness present. He has been feeling some chills present, and the temperature is subnormal at 55 degrees Fahrenheit.  EYES:  Denies any blurred vision or double vision. No pain. No redness. No inflammation.  EARS, NOSE, AND THROAT:  Negative for tinnitus, ear pain, epistaxis, nasal discharge, or difficulty swallowing.  RESPIRATORY:  Cough with greenish productive sputum ongoing for the past 2 to 3 days with some mild shortness of breath intermittently present. No hemoptysis. No excessive wheezing.  CARDIOVASCULAR:  Denies any chest pain, orthopnea. No edema. No palpitations.  GASTROINTESTINAL:  Negative for nausea, vomiting, diarrhea, abdominal pain, GERD symptoms, hematemesis, or melena.   GENITOURINARY:  Negative for dysuria, hematuria, or frequency.  ENDOCRINE:  Negative for polyuria, polydipsia, or heat or cold intolerance.  HEMATOLOGIC:  Negative for easy bruising or bleeding.  SKIN:  Negative for rash or skin lesions.  MUSCULOSKELETAL:  Negative for any arthritis, swelling, gout.  NEUROLOGIC:  Negative for any focal weakness or numbness. No history of CVA, TIA, or seizure disorder.  PSYCHIATRIC:  History of generalized anxiety disorder, takes medications Wellbutrin and Xanax with good control.   PHYSICAL EXAMINATION:  VITAL SIGNS:  Temperature 97.4 degrees Fahrenheit, pulse rate 95, respirations 20, blood pressure 113/77, oxygen saturation 99% on room air.  GENERAL:  Elderly male, alert, awake, and oriented x 3, in no acute distress at this time, pleasant and cooperative for examination.  HEAD:  Atraumatic, normocephalic.  EYES:  Pupils are equal and reactive to light and accommodation. No conjunctival pallor. No scleral icterus. Extraocular movements are intact.  NOSE:  No nasal lesions. No drainage.  EARS:  No drainage. No external lesions.  ORAL CAVITY:  No mucosal lesions. No exudates.  NECK:  Supple. No JVD. No thyromegaly. No carotid bruit.   RESPIRATORY:  Good respiratory effort present. A few rhonchi bilaterally present.  CARDIOVASCULAR:  Regular rate and rhythm. No murmurs or gallops. No peripheral edema. Femoral and pedal pulses are normal.   GASTROINTESTINAL:  Abdomen is soft and nontender. No hepatosplenomegaly. No guarding. No rigidity. Bowel sounds equal in all 4 quadrants.   GENITOURINARY:  Deferred.  MUSCULOSKELETAL:  Gait is not tested. Range of motion is adequate and strength normal in all areas.  SKIN:  Inspection is within normal limits.  LYMPH NODES:  Negative for cervical lymphadenopathy.  VASCULAR:  Good dorsalis pedis and posterior tibial pulses.  NEUROLOGICAL:  Alert, awake, and oriented x 3. Cranial nerves II through XII are grossly intact.  DTRs are 2+, symmetrical and bilaterally. Motor strength is 5/5 in bilateral upper and lower extremities.  PSYCHIATRIC:  Judgment and insight are adequate. Alert and oriented x 3. Memory and mood are within normal limits.   LABORATORY DATA:  Glucose 94, BUN 16, creatinine 1.09, sodium 141, potassium 3.7, chloride 102, bicarbonate 32, calcium 8.9. CBC with WBC of 3.6, hemoglobin 12.6, hematocrit 39.3, platelet count 153, neutrophil percent 60.6, and neutrophil count of 2.2. Urinalysis:  Nitrite negative, leukocyte esterase negative, RBC 1, WBC less than 1, bacteria none.   CHEST X-RAY:   1.  No active cardiopulmonary disease.  2.  Grossly similar appearance of known right upper lobe speculated masses.   ASSESSMENT AND PLAN:  A 54 year old Caucasian male with a history of right-sided lung cancer on chemotherapy, history of chronic obstructive pulmonary disease, who presents with the complaints of generalized weakness with productive cough with greenish-yellowish sputum ongoing for the past 2 to 3 days and with subnormal temperature associated with chills.   1.  Febrile neutropenia, likely pneumonia in view of respiratory symptoms even though chest x-ray is clear at this time. Plan:  Neutropenic precautions, blood cultures, sputum cultures, intravenous antibiotics vancomycin and cefepime and follow up CBC.   2.  Cough with greenish productive sputum, acute bronchitis, rule out pneumonia. Plan:  Blood and sputum cultures,  oxygen supplementation, vigorous nebulizers, intravenous antibiotics.  3.  Right-sided lung cancer, undergoing chemotherapy per oncologist. Most recent chemotherapy was on 10/25/2013. Consult oncology for further recommendations.  4.  Chronic obstructive pulmonary disease, stable on home nebulizers and inhalers. Continue vigorous nebulizers, oxygen supplementation, Spiriva, and Advair.  5.  History of generalized anxiety disorder, stable on Xanax and Wellbutrin.   CODE STATUS:  Full  code.   TIME SPENT:  55 minutes.    ____________________________ Juluis Mire, MD enr:nb D: 11/04/2013 03:59:39 ET T: 11/04/2013 05:32:45 ET JOB#: [id number]  cc: Juluis Mire, MD, <Dictator> Sandeep R. Ma Hillock, MD  Juluis Mire MD ELECTRONICALLY SIGNED 11/04/2013 13:56

## 2014-05-14 NOTE — H&P (Signed)
PATIENT NAME:  Darryl Hatfield, Darryl Hatfield MR#:  [id number] DATE OF BIRTH:  1960-05-06  DATE OF ADMISSION:  10/15/2013  PRIMARY CARE PHYSICIAN: Princella Ion   PRIMARY ONCOLOGIST: Dr. Ma Hillock  CHIEF COMPLAINT: Fever.   HISTORY OF PRESENT ILLNESS: This is a very pleasant 54 year old male with a history of lung cancer who presents from home with a fever and chills. The patient has also had diarrhea yesterday, no diarrhea this morning and a cough which is nonproductive. In the ER, he was noted to have a rectal temperature of 103.2.   REVIEW OF SYSTEMS: CONSTITUTIONAL: Positive fever, chills, fatigue and weakness.  EYES: No blurred or double vision, glaucoma, cataracts. ENT: No ear pain, hearing loss, seasonal allergies. RESPIRATORY: Positive cough. No wheezing, hemoptysis, dyspnea. Positive history of COPD, not on oxygen.  CARDIOVASCULAR: No chest pain, orthopnea, palpitations or syncope. GASTROINTESTINAL: No nausea, vomiting, diarrhea, abdominal, melena or ulcers. GENITOURINARY: No dysuria or hematuria.  ENDOCRINE: No polyuria or polydipsia.  HEME AND LYMPHATIC: Positive anemia and easy bruising. SKIN: No rash or lesions.  MUSCULOSKELETAL: No arthritis. NEUROLOGIC: No history of CVA, TIA or seizures.  PSYCHIATRIC: No history of anxiety or depression.   PAST MEDICAL HISTORY:  1.  Nearly diagnosed lung cancer, stage IIIB.  2.  COPD, not on oxygen.   PAST SURGICAL HISTORY: 1.  Port-A-Cath placement.  2.  Chest tube for pneumothorax.   ALLERGIES: No known drug allergies.   SOCIAL HISTORY: No tobacco, alcohol or drug use.  FAMILY HISTORY: Positive for cancer, started with the neck he says and history of COPD.  MEDICATIONS: 1.  Spiriva 18 mcg daily.  2.  Prochlorperazine 10 mg p.o. t.i.d.  3.  Percocet 5/325 one to two tablets q. 4 to 6 hours.  4.  Gabapentin 100 mg 2 tablets t.i.d.  5.  Dexamethasone 4 mg 3 tablets at 10:00 p.m. and 3 tablets at 6:00 a.m. on the day of each chemotherapy.  6.   Bupropion 200 mg b.i.d.  7.  Xanax 1 mg t.i.d.  8.  Albuterol ipratropium nebulizer 4 times a day p.r.n.  9.  Advair Diskus 250/50 b.i.d.   PHYSICAL EXAMINATION: VITAL SIGNS: Temperature 103.2, pulse 147, respirations 26, blood pressure 114/77, 95% on 2 liters.  GENERAL: The patient is alert and oriented, not in acute sentences.  HEENT: Head is atraumatic. Pupils are round and reactive. Sclerae are anicteric. Mucous membranes are moist. Oropharynx is clear.  NECK: Supple without JVD, carotid bruit, or enlarged thyroid.  CARDIOVASCULAR: Tachycardia without any murmurs, gallops, or rubs. PMI is not displaced.  LUNGS: Clear to auscultation without crackles, rales, rhonchi or wheezing. Normal to percussion.  ABDOMEN: Bowel sounds are positive. Nontender, nondistended. No hepatosplenomegaly. EXTREMITIES: No clubbing, cyanosis, or edema.  NEUROLOGIC: Cranial nerves II through XII are intact. No focal deficits. SKIN: Without any rash or lesions.   DIAGNOSTIC DATA: Influenza A and B are negative. Urinalysis is negative test.   PH 7.42 and pCO2 of 54. Lactic acid 1.2.   White blood cells 3.2, hemoglobin 13, hematocrit 39, platelets 154,000, neutrophils 87.9. Sodium 138, potassium 3.9, chloride 102, bicarb 32, BUN 20, creatinine 1.27, glucose 102, bilirubin 0.4, alk phos 64, ALT 31, AST 17, total protein 6.4, albumin 3.3.   Magnesium 1.2. Troponin less than 0.02. Phosphorus 3.3.   INR 1.   Chest x-ray shows no acute cardiopulmonary disease.   EKG: Sinus tachycardia. No ST elevation or depression.   ASSESSMENT AND PLAN: This is a 54 year old male with stage III  lung cancer, currently receiving chemotherapy, see by Dr. Ma Hillock, who presents with fever from home. His last hospitalization was in June.  1.  Sepsis. The patient presents with fever, leukocytosis, and tachycardia concerning for sepsis. Unclear etiology at this time. Blood cultures have been ordered in the ER. His urinalysis shows no  signs of urinary tract infection. Chest x-ray shows no signs of pneumonia. For now he is on empiric antibiotics with cefepime and vancomycin. Will follow up on the final results of the culture and have Dr. Ma Hillock see the patient in consultation.  2.  History of stage III3 lung cancer. We will have Dr. Ma Hillock see the patient in consultation.  3.  Hypomagnesia. We will replete and recheck in the a.m.  4.  History of chronic obstructive pulmonary disease. The patient's chronic obstructive pulmonary disease seems stable at this time. We will continue to follow and continue his outpatient medication.  CODE STATUS: The patient is FULL code status.  TIME SPENT: Approximately 45 minutes.    ____________________________ Donell Beers. Benjie Karvonen, MD spm:sb D: 10/15/2013 15:25:39 ET T: 10/15/2013 15:57:41 ET JOB#: [id number]  cc: Chaunda Vandergriff P. Benjie Karvonen, MD, <Dictator> [place] Sandeep R. Ma Hillock, MD Donell Beers Ladina Shutters MD ELECTRONICALLY SIGNED 10/15/2013 18:59

## 2014-05-14 NOTE — Discharge Summary (Signed)
PATIENT NAME:  Darryl Hatfield, Darryl Hatfield MR#:  [id number] DATE OF BIRTH:  [birth year]  DATE OF ADMISSION:  06/21/2013 DATE OF DISCHARGE:  06/24/2013  ADMITTING DIAGNOSES: Shortness of breath, cough, productive sputum, weight loss.   DISCHARGE DIAGNOSES:  1. Shortness of breath due to acute on chronic obstructive pulmonary disease exacerbation.  2. Lung mass with lymph node enlargement status post biopsy of the lymph node. He will have followup with oncology as an outpatient, likely lung cancer.  3. Nicotine addiction. The patient counseled regarding smoking cessation.   CONSULTANTS:  Dr. Humphrey Rolls, Dr. Ma Hillock.  PERTINENT LABORATORY EVALUATIONS: Admitting glucose 142, BUN 16, creatinine 0.81, sodium 134, potassium 4.1, chloride was 97, CO2 was 30, troponin less than 0.02. WBC 7.3, hemoglobin 14.9, platelet count 263,000. EKG shows sinus tachycardia with possible left atrial enlargement. PA and lateral chest x-ray showed no acute cardiopulmonary processes, except COPD.  Ill-defined 19 mL focal opacity within the right upper lobe. CT scan of the chest showed right upper lobe spiculated mass. Also, associated right hilar and right paratracheal mediastinal lymph node enlargements. PET scan shows malignant range FDG uptake is associated with the right upper lobe nodule concerning for primary bronchogenic carcinoma, hypermetabolic ipsilateral hilar and mediastinal lymph node metastasis.  There is malignant drainage of FDG uptake associated with bilateral level II cervical lymph nodes.   HOSPITAL COURSE: The patient is a 54 year old male with a history of 40 years of smoking, presents with shortness of breath, cough. The patient was not on any treatment prior to admission. He came in and was diagnosed with acute on chronic COPD exacerbation, treated with nebulizers and steroids. He underwent evaluation with CT scan of the chest which showed findings consistent for a lung mass as well as lymph node enlargements. The patient  underwent PET scan which confirmed these findings. He was noted to have a cervical lymph node enlargement that was biopsied today. Pathology is pending. He was seen in consultation by oncology and pulmonary. He will follow up with both as an outpatient for further treatment of his lung lesions. At this time, he is doing much better from a respiratory standpoint and is stable for discharge.   DISCHARGE MEDICATIONS: Wellbutrin 150 mg 1 tablet p.o. b.i.d., prednisone taper starting at 60 mg, taper by 10 until complete. Albuterol ipratropium 4 times a day as needed, Spiriva 18 mcg daily, nicotine 21 mg 1 patch transdermally daily for 30 days, Advair Diskus 250/50 one puff b.i.d., alprazolam 1 mg t.i.d. as needed for agitation.   DIET: Regular.   ACTIVITY: As tolerated.   FOLLOWUP: Follow with primary MD in 1-2 weeks. Follow with Dr. Humphrey Rolls of pulmonary in 1-2 weeks. Follow with Dr. Ma Hillock next week.   DISCHARGE INSTRUCTIONS: The patient told to stop smoking.    TIME SPENT: 35 minutes.    ____________________________ Lafonda Mosses Posey Pronto, MD shp:dd D: 06/24/2013 14:21:26 ET T: 06/24/2013 19:02:12 ET JOB#: [id number]  cc: Daegen Berrocal H. Posey Pronto, MD, <Dictator> Alric Seton MD ELECTRONICALLY SIGNED 06/26/2013 14:32

## 2014-05-14 NOTE — Consult Note (Signed)
ONCOLOGY followup note - patient overall better, cough and dyspnea slowly improving. No pain issues.  No hemoptysis. No fevers. sitting in chair, on [place] O2. NAD.        vitals - afebrile, stable        lungs - decreased BS b/l, no rhonchi       abd - soft, NT       - WBC 16.4, neutrophils 92%, Hb 13.1, platelets 243, Cr 0.73.  06/23/13 - PET scan. IMPRESSION:Malignant range FDG uptake is associated with the right upper lobe nodule and is concerning for primary bronchogenic carcinoma.  2. Hypermetabolic ipsilateral hilar and mediastinal lymph node metastasis.  3. There is malignant range FDG uptake associated with bilateral level 2 cervical lymph nodes. This is of uncertain significance but metastatic adenopathy is not excluded. Consider further evaluation with soft tissue neck ultrasound with possible lymph node tissue sampling to confirm,  54 year old gentleman with long-standing history of smoking, chronic obstructive pulmonary disease, admitted with progressive respiratory symptoms including hypoxia and others, abnormal chest x-ray and CT scan of the chest showing a spiculated nodule in the right upper lobe measuring 18 mm, right hilar and mediastinal adenopathy, necrotic right hilar lymph nodes, necrotic right paratracheal lymph nodes, azygos level lymph node, subcentimeter lymph nodes anterior to the carotid and AP window. PET scan shows abnormal activity in right lung mass, hilar and mediastinal adenopathy, b/l upper cervical adenopathy. The patient and family present (his wife and son) were explained about PET scan findings and films reviewed with them. Plan is to request Ultrasound-guided biopsy of left upper neck adenopathy to see if we can get tissue diagnosis. Peggye Ley will need CT-guided lung bx or EBUS bx. He does not have any acute pain issues at this time or hemoptysis. The patient is agreeable to this plan.    Electronic Signatures: Jonn Shingles (MD)  (Signed on 03-Jun-15  22:47)  Authored  Last Updated: 03-Jun-15 22:47 by Jonn Shingles (MD)

## 2014-05-14 NOTE — Consult Note (Signed)
Reason for Visit: This 54 year old Male patient presents to the clinic for initial evaluation of  lung cancer .   Referred by Dr. Oliva Bustard.  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 He was started on bronchodilator therapy although chest x-ray and CT scan demonstrated a spiculated mass in the right lung concerning for malignancy. PET CT scan was performed showing hypermetabolic uptake in right upper lobe lung nodule as well as ipsilateral mediastinal lymph node metastasis.bronchoscopy was performed and tissue diagnosis was positive for squamous cell carcinoma. He also had a hypermetabolic mass in his parotid glands which turned out to be on biopsy an oncocytic lesion. He's had about a 10-15 pound weight loss over the past several months.his case was presented our weekly tumor conference not thought to be a surgical candidate based on IIIa disease recognition was made for chemoradiation he is seen today for radiation oncology consultation. He is doing fairly well at this time and no specific dysphasia. No hemoptysis. Chest tightness and dyspnea on exertion has improved with bronchodilators. Patient is fairly emotional secondary to depression.  Past Hx:    lung cancer:    heart disease:    diabetes:    lung mass:    fatigue:    COPD:    Denies medical history:    Denies surgical history.:   Past, Family and Social History:  Past Medical History positive   Cardiovascular coronary artery  disease   Respiratory COPD   Endocrine diabetes mellitus   Family History positive   Family History Comments father with cancer of unknown type.   Social History positive   Social History Comments greater than 40-pack-year smoking history recently quit smoking no EtOH abuse history   Additional Past Medical and Surgical History seen accompanied by his wife today   Allergies:   No Known Allergies:   Home Meds:  Home Medications: Medication Instructions Status  ALPRAZolam 1 mg oral tablet 1 tab(s) orally 3 times a day, As Needed - for Agitation Active  Advair Diskus 250 mcg-50 mcg inhalation powder 1 puff(s) inhaled 2 times a day Active  albuterol-ipratropium 2.5 mg-0.5 mg/3 mL inhalation solution 4 milliliter(s) inhaled 4 times a day, As Needed - for Shortness of Breath - for Wheezing  Active  tiotropium 18 mcg inhalation capsule 1 cap(s) inhaled once a day Active  nicotine 21 mg/24 hr transdermal film, extended release 1 patch transdermal once a day Active  Wellbutrin 150 mg/12 hours oral tablet, extended release 1 tab(s) orally 2 times a day Active  naproxen 500 mg oral tablet 1 tab(s) orally 2 times a day, As Needed  nulll  stopped!! Active   Review of Systems:  General negative   Performance Status (ECOG) 0   Skin negative   Breast negative   Ophthalmologic negative   ENMT negative   Respiratory and Thorax see HPI   Cardiovascular negative   Gastrointestinal negative   Genitourinary negative   Musculoskeletal negative   Neurological negative   Psychiatric negative   Hematology/Lymphatics negative  Endocrine negative   Allergic/Immunologic negative   Review of Systems   review of systems obtained from nurses notes  Nursing Notes:  Nursing Vital Signs and Chemo Nursing Nursing Notes: *CC Vital Signs Flowsheet:   19-Jun-15 08:21  Temp Temperature 98  Pulse Pulse 60  Respirations Respirations 18  SBP SBP 130  DBP DBP 70  Pain Scale (0-10)  0   Current Weight (kg) (kg) 54  Height (cm) centimeters 173  BSA (m2) 1.6   Physical Exam:  General/Skin/HEENT:  General normal   Skin normal   Eyes normal   ENMT normal   Head and Neck normal   Additional PE a well-developed thin male in NAD. Lungs are clear to A&P cardiac examination shows regular rate and rhythm. No cervical or supraclavicular adenopathy is appreciated. Abdomen is benign.   Breasts/Resp/CV/GI/GU:  Respiratory and Thorax normal   Cardiovascular normal   Gastrointestinal normal   Genitourinary normal   MS/Neuro/Psych/Lymph:  Musculoskeletal normal   Neurological normal   Lymphatics normal   Other Results:  Radiology Results: LabUnknown:    01-Jun-15 01:55, CT Chest With Contrast  PACS Image     03-Jun-15 11:12, PET/CT Scan Lung Cancer Diagnosis  PACS Image   CT:    01-Jun-15 01:55, CT Chest With Contrast  CT Chest With Contrast   REASON FOR EXAM:    dyspnea, cough, weight loss, mass noted on CXR  COMMENTS:       PROCEDURE: CT  - CT CHEST WITH CONTRAST  - Jun 21 2013  1:55AM     CLINICAL DATA:  Followup possible right lung masses have the right  hilar adenopathy.    EXAM:  CT CHEST WITH CONTRAST    TECHNIQUE:  Multidetector CT imaging of the chest was performed during  intravenous contrast administration.  CONTRAST:  75 mL of Isovue 300 intravenous contrast    COMPARISON:  Current chest radiograph    FINDINGS:  There is a spiculated mass in the right upper lobe posteriorly  measuring 18 mm x 15 mm x 18 mm. There is associated right hilar and  mediastinal adenopathy. There are necrotic right hilar lymph nodes,  1 more laterally measuring 16 mm in short axis and 1 more superiorly  measuring 13 mm in short axis. There is necrotic right peritracheal,  azygos level lymph node measuring 15 mm in short axis. There are no  pathologically enlarged contralateral hilar or mediastinal lymph  nodes. Several sub cm lymph nodes are noted  anterior to the carotid  and in aortopulmonary window.  The lungs are hyperexpanded. In the left lower lobe, there is  peribronchial thickening and coarse reticular and patchy  peribronchial densities. This is seen to a milder degree in the left  upper lobe lingula. This may all be chronic. It could reflect an  acute bronchitis/ bronchopneumonia.    There are changes of mild centrilobular emphysema.    No pleural effusion.  No pleural based mass.    No neck base or axillary masses or adenopathy. The heart is normal  in size and configuration. The great vessels are unremarkable.    Limited evaluation of the upper abdomen shows no adrenal masses. No  liver masses are seen. There are no acute findings.  No osteoblastic or osteolytic lesions.     IMPRESSION:  1. Right upper lobe spiculated mass highly suspicious for primary  bronchogenic carcinoma. There is associated right hilar and right  peritracheal mediastinal adenopathy consistent with metastatic  adenopathy. No  other evidence of metastatic disease.  2. There are areas of peribronchovascular opacity and bronchial wall  thickening in the left lower lobe and left upper lobe lingula that  could reflect acute infection or be chronic.  3. Findings of COPD.      Electronically Signed    By: Lajean Manes M.D.    On: 06/21/2013 02:03         Verified By: Lasandra Beech, M.D.,  Nuclear Med:    03-Jun-15 11:12, PET/CT Scan Lung Cancer Diagnosis  PET/CT Scan Lung Cancer Diagnosis   REASON FOR EXAM:    right lung nodule, mediastinal adenopathy  COMMENTS:   LMP: (Male)    PROCEDURE: PET - PET/CT DX LUNG CA  - Jun 23 2013 11:12AM     CLINICAL DATA:  Initial treatment strategy for Lung cancer.    EXAM:  NUCLEAR MEDICINE PET SKULL BASE TO THIGH    TECHNIQUE:  13.0 MCi F-18 FDG was injected intravenously. Full-ring PET imaging  was performed from the skull base to thigh after the radiotracer. CT  data was obtained and used for  attenuation correction and anatomic  localization.  FASTING BLOOD GLUCOSE:  Value: 109 mg/dl    COMPARISON:  06/21/2013    FINDINGS:  NECK    Hypermetabolic bilateral level 2 lymph nodes are identified. The SUV  max associated with the left level 2 lymph node measures 6.2. Right  level 2 lymph node has an SUV max equal to 5.6.    CHEST    Right upper lobe nodule measures 1.5 cm and has an SUV max equal to  7.4, image 87. Hypermetabolic right hilar lymph node measures  approximately 1.3 cm and has an SUV max equal to 5.8 cm. Right  paratracheal lymphnode measures 1.5 cm and has an SUV max equal to  7.8 cm. Within the anterior mediastinum there is a hypermetabolic  pre-vascular lymph node which measures 1 cm. This has an SUV max  equal to 6.5, image 76. No supraclavicular or axillary adenopathy.    ABDOMEN/PELVIS    No abnormal hypermetabolic activity within the liver, pancreas,  adrenal glands, or spleen. No hypermetabolic lymph nodes in the  abdomen or pelvis.    SKELETON    No focal hypermetabolic activity to suggest skeletal metastasis.   IMPRESSION:  1. Malignant range FDG uptake is associated with the right upper  lobe nodule and is concerning for primary bronchogenic carcinoma.  2. Hypermetabolic ipsilateral hilar and mediastinal lymph node  metastasis.  3. There is malignant rangeFDG uptake associated with bilateral  level 2 cervical lymph nodes. This is of uncertain significance but  metastatic adenopathy is not excluded. Consider further evaluation  with soft tissue neck ultrasound with possible lymph node tissue  sampling to confirm.      Electronically Signed    By: Kerby Moors M.D.    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 Plan:   at this time I have recommended going ahead with concurrent IM RT radiation therapy to his chest to deliver 6000 cGy over 6 weeks with concurrent chemotherapy. Based on the extensive mediastinal involvement by PET CT criteria will have 3 large volume of lung with radiation therapy. I am  concerned his V. 20 will exceed 35% and leave IM RT can reduce this by approximately 20%. I would favor IM RT treatment over standard three-dimensional treatment. Risks and benefits of treatment including loss of normal lung volume, fatigue, possible dysphagia from radiation esophagitis, alteration of blood counts, and skin reaction all were described in detail to the patient and his wife. Both seem to comprehend my treatment plan well. I've also discussed with thoracic surgery placement of his Port-A-Cath which Nate may need to be in the right chest wall based on ability for venous access. Patient was set up for CT simulation early next week. Have discussed the case personally withmedical oncology. Case again was presented at our weekly tumor conference.  I would like to take this opportunity for allowing me to participate in the care of your patient..  CC Referral:  cc: Dr. Mont Dutton white   Electronic Signatures: Armstead Peaks (MD)  (Signed 19-Jun-15 11:04)  Authored: HPI, Diagnosis, Past Hx, PFSH, Allergies, Home Meds, ROS, Nursing Notes, Physical Exam, Other Results, Relevent Results, Encounter Assessment and Plan, CC Referring Physician   Last Updated: 19-Jun-15 11:04 by Armstead Peaks (MD)

## 2014-05-14 NOTE — Consult Note (Signed)
PATIENT NAME:  Darryl Hatfield, Darryl Hatfield MR#:  [id number] DATE OF BIRTH:  [birth year]  DATE OF CONSULTATION:  12/09/2013  REFERRING PHYSICIAN:   CONSULTING PHYSICIAN:  Weslee Fogg R. Ma Hillock, MD  REASON FOR CONSULTATION: History of non-small cell lung cancer, admitted with progressive cough and dyspnea.   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 The patient has been admitted to hospital with complaints of progressive shortness of breath on minimal exertion. In the Emergency Room, he had oxygen saturation of 89%. The patient is currently on antibiotic and supportive treatment, and he is beginning to feel better. He continues to have cough and greenish phlegm for the past week or so. No fevers, but temperature at home was up to 99. No hemoptysis. No new bone pains.   PAST MEDICAL HISTORY AND PAST SURGICAL HISTORY: As in HPI above.   FAMILY HISTORY: Noncontributory.   SOCIAL HISTORY: Chronic smoker, 40-pack-year history, quit June 2015. Works in [employer]. Denies alcohol or recreational drug usage.   ALLERGIES: No known drug allergies.   HOME MEDICATIONS: Oxycodone 5 to 10 mg q. 4 hours p.r.n. for pain, Xanax 1 mg t.i.d. p.r.n. for anxiety, guaifenesin 5 mL q. 4 hours p.r.n. for cough, albuterol inhaler 3 mL q. 6 hours p.r.n., Compazine 10 mg t.i.d. p.r.n., bupropion 200 mg/12 hours oral tablet extended release twice daily, albuterol/ipratropium 4 mL inhaler 4 times a day as needed, Spiriva inhaler 18 mcg once daily, Advair Diskus 250/50 inhaler b.i.d., gabapentin 100 mg 3 capsules t.i.d.   REVIEW OF SYSTEMS:  CONSTITUTIONAL: Feeling weak and tired but overall better today. No fevers or chills  currently.  HEENT: No headaches or dizziness at rest. No epistaxis, ear or jaw pain.  CARDIAC: No angina, palpitation, orthopnea, or PND.  LUNGS: As in HPI.  GASTROINTESTINAL: No nausea, vomiting, or diarrhea. No bright red blood in stools or melena.  GENITOURINARY: No dysuria or hematuria.  SKIN: No new rashes or pruritus.  HEMATOLOGIC: No obvious bleeding issues.  MUSCULOSKELETAL: No new bone pains.  NEUROLOGIC: No new focal weakness, seizures or loss of consciousness.  ENDOCRINE: No polyuria or polydipsia.   PHYSICAL EXAMINATION:  GENERAL: Patient is resting in bed, otherwise alert and oriented and converses appropriately. No acute distress, on nasal cannula oxygen. No icterus. VITAL SIGNS: Temperature 97.3, heart rate 113, respiratory rate 18, blood pressure 122/74, saturating 93% on 3 L.  HEENT: Normocephalic, atraumatic. Extraocular movements intact. Sclerae anicteric.  NECK: Negative for lymphadenopathy. CARDIOVASCULAR: S1, S2, regular rate and rhythm, mildly tachycardic.  LUNGS: Show bilateral diminished breath sounds overall. No rhonchi.  ABDOMEN: Soft, nontender. No hepatomegaly. Bowel sounds present.  EXTREMITIES: Show no major edema or cyanosis.  SKIN: Shows no generalized rashes or major bruising. NEUROLOGIC: Limited exam. Cranial nerves seem intact. Moves all extremities spontaneously.   LABORATORY RESULTS: WBC 4600, hemoglobin 12.0, platelets 274,000, neutrophils, 4.1. Creatinine 0.79, calcium 9.0. Blood cultures negative so far.   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 Chest x-ray repeated today shows a spiculated right upper lobe nodule with reticular markings which has slowly increased over  the last few months, and emphysema. Last CT scan of the chest with contrast was on  October 23 which had reported that the 2 index lesions in the left and right upper lobes have decreased in size, new nodule in the right lung base measuring 6 mm, emphysema. The patient admitted with likely bronchitis and chronic obstructive pulmonary disease exacerbation, also being treated for possible early pneumonia. No definite fevers. Blood cultures negative so far. Agree with ongoing supportive treatment, broad-spectrum antibiotic therapy. Otherwise, no definite symptoms to indicate progression of metastatic malignancy at this time. If patient is discharged soon, he was advised to keep previously scheduled appointments at [facility] the same. No major pain issues at this time. The patient is agreeable to this plan.   Thank you for the referral. Please feel free to contact me for additional questions.   ____________________________ Rhett Bannister Ma Hillock, MD srp:ST D: 12/09/2013 23:34:37 ET T: 12/10/2013 00:53:10 ET JOB#: [id number]  cc: Manoah Deckard R. Ma Hillock, MD, <Dictator> Alveta Heimlich MD ELECTRONICALLY SIGNED 12/10/2013 9:47

## 2014-05-14 NOTE — Consult Note (Signed)
Chief Complaint:  Subjective/Chief Complaint had PET scan done. The area of concern lights up on the scan along with some hial nodes.   VITAL SIGNS/ANCILLARY NOTES: **Vital Signs.:   03-Jun-15 11:30  Vital Signs Type Routine  Temperature Temperature (F) 97.5  Celsius 36.3  Temperature Source oral  Pulse Pulse 97  Respirations Respirations 20  Systolic BP Systolic BP 353  Diastolic BP (mmHg) Diastolic BP (mmHg) 80  Mean BP 98  Pulse Ox % Pulse Ox % 95  Pulse Ox Activity Level  At rest  Oxygen Delivery 2L  *Intake and Output.:   Shift 03-Jun-15 07:00  Grand Totals Intake:  600 Output:  400    Net:  200 24 Hr.:  -100  IV (Primary)      In:  600  Urine ml     Out:  400  Length of Stay Totals Intake:  4294 Output:  3600    Net:  694    07:47  Grand Totals Intake:   Output:  550    Net:  -550 24 Hr.:  -550   Brief Assessment:  GEN no acute distress   Cardiac Regular  -- LE edema  -- JVD   Respiratory normal resp effort  clear BS  no use of accessory muscles   Gastrointestinal details normal Soft  Nontender  No gaurding   Radiology Results: Nuclear Med:    03-Jun-15 11:12, PET/CT Scan Lung Cancer Diagnosis  PET/CT Scan Lung Cancer Diagnosis   REASON FOR EXAM:    right lung nodule, mediastinal adenopathy  COMMENTS:   LMP: (Male)    PROCEDURE: PET - PET/CT DX LUNG CA  - Jun 23 2013 11:12AM     CLINICAL DATA:  Initial treatment strategy for Lung cancer.    EXAM:  NUCLEAR MEDICINE PET SKULL BASE TO THIGH    TECHNIQUE:  13.0 MCi F-18 FDG was injected intravenously. Full-ring PET imaging  was performed from the skull base to thigh after the radiotracer. CT  data was obtained and used for attenuation correction and anatomic  localization.  FASTING BLOOD GLUCOSE:  Value: 109 mg/dl    COMPARISON:  06/21/2013    FINDINGS:  NECK    Hypermetabolic bilateral level 2 lymph nodes are identified. The SUV  max associated with the left level 2 lymph node measures 6.2.  Right  level 2 lymph node has an SUV max equal to 5.6.    CHEST    Right upper lobe nodule measures 1.5 cm and has an SUV max equal to  7.4, image 87. Hypermetabolic right hilar lymph node measures  approximately 1.3 cm and has an SUV max equal to 5.8 cm. Right  paratracheal lymphnode measures 1.5 cm and has an SUV max equal to  7.8 cm. Within the anterior mediastinum there is a hypermetabolic  pre-vascular lymph node which measures 1 cm. This has an SUV max  equal to 6.5, image 76. No supraclavicular or axillary adenopathy.    ABDOMEN/PELVIS    No abnormal hypermetabolic activity within the liver, pancreas,  adrenal glands, or spleen. No hypermetabolic lymph nodes in the  abdomen or pelvis.    SKELETON    No focal hypermetabolic activity to suggest skeletal metastasis.   IMPRESSION:  1. Malignant range FDG uptake is associated with the right upper  lobe nodule and is concerning for primary bronchogenic carcinoma.  2. Hypermetabolic ipsilateral hilar and mediastinal lymph node  metastasis.  3. There is malignant rangeFDG uptake associated with bilateral  level 2 cervical lymph nodes. This is of uncertain significance but  metastatic adenopathy is not excluded. Consider further evaluation  with soft tissue neck ultrasound with possible lymph node tissue  sampling to confirm.      Electronically Signed    By: Kerby Moors M.D.    On: 06/23/2013 12:08     Verified By: Angelita Ingles, M.D.,   Assessment/Plan:  Assessment/Plan:  Assessment 1. Lung nodule -it may be better to try a CT guided biopsy as the lesion is central -we may also consider doing a EBUS on him if CT guidance is not an option or a Navigation bronch could be considered -I will discuss further with radiology   Electronic Signatures: Allyne Gee (MD)  (Signed 03-Jun-15 12:39)  Authored: Chief Complaint, VITAL SIGNS/ANCILLARY NOTES, Brief Assessment, Radiology Results, Assessment/Plan   Last  Updated: 03-Jun-15 12:39 by Allyne Gee (MD)

## 2014-05-14 NOTE — H&P (Signed)
PATIENT NAME:  Darryl Hatfield, Darryl Hatfield MR#:  [id number] DATE OF BIRTH:  1960/08/25  PRIMARY CARE PHYSICIAN:  Sandeep R. Ma Hillock, MD  HISTORY OF PRESENT ILLNESS:  The patient is a 54 year old Caucasian male with a history of squamous cell lung carcinoma stage IIIA-B who finished the concurrent radiation and chemotherapy of Taxol and carboplatin cycle 4, day 15. Chemotherapy was on 08/02/2013.  Presents to the hospital with complaints of cough, shortness of breath, and green phlegm for the for the past one week.  According to the patient, he was doing well up until approximately a week ago when he started having cough and green phlegm production. He has been also wheezing short and having significant shortness of breath. On arrival to the Emergency Room he was noted to have oxygen saturation of 89% and hospitalist services were contacted for admission. His chest x-ray was concerning for possible right upper lobe pneumonia.   PAST MEDICAL HISTORY:  Significant for history of squamous cell lung carcinoma stage IIIA-B, finished concurrent radiation and chemotherapy of Taxol and carboplatin cycle 4, day 15. Chemotherapy was on 08/02/2013.  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   PAST SURGICAL HISTORY: As above.   HOME MEDICATIONS: The patient's medication list is as follows:  1.  Advair Diskus 250/50 twice daily. 2.  Albuterol inhalation solution every 6 hours as needed. 3.  DuoNeb 4 times daily as needed. 4.  Alprazolam 1 mg 3 times daily as needed. 5.  Augmentin 875/125 mg 1 tablet twice daily.  6.  Bupropion 200 mg p.o. twice daily. 7.  Gabapentin 100 mg 3 times daily.  8.  Oxycodone 5 mg 1-2 tablets every 4 hours as needed. 9.   Prochlorperazine 10 mg 3 times daily as needed. 10.  Spiriva 1 inhalation once daily.   ALLERGIES: No known drug allergies.   SOCIAL HISTORY: Retired Engineer, manufacturing systems, ex-smoker, smoked approximately 40 years, quit in June 2015 and occasional alcohol use in the past; however, quit at the same time. Denies any history of substance abuse.   FAMILY HISTORY: The patient's mother's side family had heart disease, COPD, as well as abdominal aortic aneurysm.  Father's side significant for cancers.   REVIEW OF SYSTEMS:  CONSTITUTIONAL:  Positive for cough, phlegm production, shortness of breath, wheezes, heart palpitations.  Denies any high fevers or chills, pains, weight loss. EYES: Denies any blurry vision, double vision, or glaucoma, cataracts. EARS, NOSE, AND THROAT: Denies any tinnitus, allergies, epistaxis, sinus pain, discharge or difficulty swallowing.  RESPIRATORY: Denies any hemoptysis, asthma, COPD.  CARDIOVASCULAR: Denies any chest pains, orthopnea, edema, arrhythmias, palpitations, or syncope. GASTROINTESTINAL: Denies any nausea, vomiting, diarrhea or constipation.  GENITOURINARY: Denies an dysuria, hematuria, frequency, or incontinence. ENDOCRINOLOGY: Denies any polydipsia, nocturia, thyroid problems, heat or cold intolerance, or thirst.  HEMATOLOGIC: Denies anemia, easy bruising, or bleeding, swollen glands.  SKIN: Denies acne, rash, lesions, or change in moles.  MUSCULOSKELETAL: Denies arthritis, cramps, or swelling. NEUROLOGIC: Denies numbness, epilepsy, or tremor.  PSYCHIATRIC: Denies anxiety, insomnia, or depression.  PHYSICAL EXAMINATION:  VITAL SIGNS:  On arrival to the hospital patient's temperature was 97.8, pulse 121, respiration was 20, blood pressure 110/70, saturation was 89% on room air.  GENERAL: This is a well-developed, well-nourished Caucasian male in  mild to moderate distress, has scared look in his eyes.   HEENT: His pupils are equal, reactive to light. Extraocular  muscles intact. No icterus or conjunctivitis. Has normal hearing. No pharyngeal erythema. Mucosa is moist. NECK: No masses. Supple, nontender. Thyroid is not enlarged. No adenopathy. No JVD or carotid bruits bilaterally. Full range of motion.  LUNGS: Clear to auscultation on the left; however, right posteriorly in upper lung area was noted for crackles as well as rhonchi.  Somewhat diminished breath sounds on the right side as well as prolonged expiration phase.  Labored inspirations as well as increased effort to breathe, in mild respiratory distress.  CARDIOVASCULAR: S1, S2 appreciated. The rhythm is regular. PMI not lateralized. Chest is nontender to palpation, 1+ pedal pulses. No lower extremity edema, calf tenderness, or cyanosis was noted. ABDOMEN: Soft, nontender. Bowel sounds are present. No hepatosplenomegaly or masses were noted.  RECTAL: Deferred.  MUSCULOSKELETAL:  Muscle strength able to move all extremities. No cyanosis, degenerative joint disease, or kyphosis. Gait was not tested.  SKIN: Did not reveal any rashes, lesions, erythema, nodularity, or induration. It was warm and dry to palpation. LYMPHATIC: No adenopathy in the cervical region.  NEUROLOGIC: Cranial nerves intact.  Sensory is intact.  No dysarthria or aphasia.  PSYCHIATRIC: The patient is alert, oriented to time, person, and place, cooperative. Memory is good. No significant confusion, agitation, or depression. The patient does have some anxious look and overall he is anxious and somewhat tremulous.  LABORATORY DATA:  Done on arrival, BMP unremarkable, CO2 level is high at 33. Liver enzymes: Albumin level of 2.7, otherwise unremarkable. Troponin is less than 0.02. White blood cell count is normal at 5.1, hemoglobin 12.3, platelet count 272,000.   RADIOLOGIC STUDIES: Chest x-ray, PA and lateral, 12/08/2013, showed spiculated right upper lobe nodule with reticular markings lateral to nodule, which has slowly increased over the  past few months, potentially postobstructive pneumonitis or lymphangitic spread of tumor. Emphysema was also noted. EKG is not done.   ASSESSMENT AND PLAN: 1.  Pneumonia, right upper lobe, due to infectious organism of unclear etiology at this time. Admit the patient to medical floor. Start him on Rocephin and Zithromax, get sputum cultures as well as blood cultures, watch culture results.  2.  Chronic obstructive pulmonary disease exacerbation. Initiate the patient on steroids, nebulizers as well as inhalers, and follow patient clinically.  3.  Hypoxia. Continue oxygen as needed, arterial blood gases.  The patient may need BiPAP as the patient's CO2 on basic metabolic profile is 33. He may be retaining CO2. 4.  Anemia. Get guaiac.  5.  Lung cancer. Will get oncologist to see patient as well.   TIME SPENT: 50 minutes.   ____________________________ Theodoro Grist, MD rv:LT D: 12/08/2013 19:25:10 ET T: 12/08/2013 20:25:36 ET JOB#: [id number]  cc: Theodoro Grist, MD, <Dictator> Sandeep R. Ma Hillock, MD Theodoro Grist MD ELECTRONICALLY SIGNED 01/13/2014 20:51

## 2014-05-14 NOTE — Consult Note (Signed)
PATIENT NAME:  Darryl Hatfield, Darryl Hatfield MR#:  [id number] DATE OF BIRTH:  03-12-1960  DATE OF CONSULTATION:  06/21/2013  REFERRING PHYSICIAN:  Dr. Waldron Labs  CONSULTING PHYSICIAN:  Desiree Daise R. Ma Hillock, MD  REASON FOR CONSULTATION: Lung nodule with mediastinal adenopathy suspicious for lung cancer.   HISTORY OF PRESENT ILLNESS: The patient is a 54 year old gentleman with a past medical history significant for COPD, 40-pack-year history of smoking who works in the [employer] and has been admitted to the hospital earlier today with complaints of progressive shortness of breath on exertion and at rest, productive cough with whitish sputum. He was found to have hypoxia in the Emergency Room with saturation in the mid 80s. Currently, he is on bronchodilator therapy, IV Solu-Medrol, oxygen and states that symptoms are slightly better. Chest x-ray done was abnormal. He then had a CT scan of the chest which showed a right lung spiculated nodule suspicious for primary lung cancer associated with multiple areas of mediastinal adenopathy. The patient denies any pain issues. Denies any fevers or night sweats. Appetite has been fairly steady but states he has lost weight unintentionally of about 10 to 15 pounds. Otherwise, he has been fairly active and ambulatory up until 1 to 2 weeks ago when respiratory symptoms got worse.   PAST MEDICAL HISTORY AND PAST SURGICAL HISTORY: As in HPI above.   FAMILY HISTORY: Remarkable for heart disease. Denies malignancy.   SOCIAL HISTORY: Chronic smoker, 1 pack a day for 40 years. Works in the [employer]. Denies alcohol or recreational drug usage.   ALLERGIES: No known drug allergies.   HOME MEDICATIONS: Wellbutrin b.i.d.   REVIEW OF SYSTEMS:  CONSTITUTIONAL: As in HPI. No fevers or chills. No night sweats.  HEENT: Denies any headaches or dizziness at rest. No epistaxis, ear or jaw pain.  CARDIAC: No angina, palpitation, orthopnea or PND.   LUNGS: As in HPI.   GASTROINTESTINAL: No nausea, vomiting or diarrhea. No bright red blood in stools or melena.  GENITOURINARY: No dysuria or hematuria.  SKIN: No new rashes or pruritus.  HEMATOLOGIC: Denies bleeding issues.  MUSCULOSKELETAL: No new bone pains.  EXTREMITIES: No new swelling or pain.  NEUROLOGIC: No new focal weakness, seizures or loss of consciousness.  ENDOCRINE: No polyuria or polydipsia.  PS ECOG 1-2.   PHYSICAL EXAMINATION:  GENERAL: The patient is resting in bed, thin built, otherwise alert and oriented and converses appropriately. No icterus. Mild pallor.  VITAL SIGNS: 97.9, 109, 18, 114/76, 93% on 2 liters.  HEENT: Normocephalic, atraumatic. Extraocular movements intact. No icteric. No oral thrush.  NECK: Negative for lymphadenopathy.  CARDIOVASCULAR: S1, S2, regular rate and rhythm.  LUNGS: Show bilateral diminished breath sounds overall, occasional rhonchi noted.  ABDOMEN: Soft, nontender. No hepatomegaly or masses clinically.  EXTREMITIES: Show no major edema or cyanosis.  NEUROLOGIC: Grossly nonfocal, cranial nerves intact.  SKIN: No generalized rashes or major bruising.  MUSCULOSKELETAL: No obvious joint redness or swelling.   LABORATORY RESULTS: EKG shows sinus tachycardia. Creatinine 0.81, potassium 4.1, calcium 9.4. WBC 7300, hemoglobin 14.9, platelets 263.   IMPRESSION AND RECOMMENDATIONS: A 54 year old gentleman with long-standing history of smoking, chronic obstructive pulmonary disease, admitted with progressive respiratory symptoms including hypoxia and others, abnormal chest x-ray and CT scan of the chest showing a spiculated nodule in the right upper lobe measuring 18 mm, right hilar and mediastinal adenopathy, necrotic right hilar lymph nodes, necrotic right paratracheal lymph nodes, azygos level lymph node, subcentimeter lymph nodes anterior to the carotid and AP window.  The patient and family present (his wife and son) were explained about CT scan findings, that it  raises strong suspicion of advanced lung cancer and that he needs further workup. Pulmonary consult is pending. Will see if bronchoscopy and biopsy is feasible. In the meantime, will proceed with PET scan evaluation as soon as possible for lung cancer diagnosis and further staging. He does not have any headaches or other neurological symptoms at this time. The patient explained that if this is malignancy, radiologically it is at least stage III disease and that we will make further plan of management once PET scan and biopsy report is available. He does not have any acute pain issues at this time or hemoptysis. The patient is agreeable to this plan.   Thank you for the referral. Please feel free to contact me for additional questions.    ____________________________ Rhett Bannister Ma Hillock, MD srp:gb D: 06/21/2013 22:28:54 ET T: 06/21/2013 23:05:02 ET JOB#: [id number]  cc: Trevor Iha R. Ma Hillock, MD, <Dictator> Alveta Heimlich MD ELECTRONICALLY SIGNED 06/23/2013 8:52

## 2014-05-14 NOTE — Discharge Summary (Signed)
PATIENT NAME:  Darryl Hatfield, CHIARAMONTE MR#:  [id number] DATE OF BIRTH:  09-02-1960  DATE OF ADMISSION:  10/15/2013 DATE OF DISCHARGE:  10/19/2013  For a detailed note please see the history and physical done on admission by Dr. Bettey Costa.   DIAGNOSES AT DISCHARGE:  1.  Fever of unknown origin, now resolved.   2.  Sepsis secondary to pneumonia.  3.  Postobstructive pneumonia.  4.  Lung cancer.  5.  Chronic obstructive pulmonary disease.  6.  Anxiety/neuropathy.   DISCHARGE DIET:   The patient is being discharged on a regular diet.   DISCHARGE ACTIVITY: As tolerated.   FOLLOWUP: With Dr. Ma Hillock in the next 1-2 days.    DISCHARGE MEDICATIONS: DuoNebs 4 times daily as needed, Spiriva 1 puff daily, bupropion 200 mg b.i.d., prochlorperazine 10 mg t.i.d. as needed, dexamethasone 4 mg tablet, take 3 tablets at 10:00 p.m. and then 3 tablets at 6:00 a.m. on the day of chemotherapy, Advair 250 one puff b.i.d., Xanax 1 mg t.i.d. as needed, albuterol nebulizer every 6 hours as needed, Percocet 5/325 1-2 tabs q. 4-6 hours as needed, gabapentin 100 mg 2 caps t.i.d., and Levaquin 750 mg daily x 7 days.   CONSULTANTS DURING THE HOSPITAL COURSE: Dr. Juliann Mule from oncology.   PERTINENT STUDIES DONE DURING THE HOSPITAL COURSE:   1.  A chest x-ray done on admission showing right upper lobe pulmonary nodule.   2.  The patient's sputum culture growing gram-negative rod, still not identified yet. Blood cultures negative. Urine cultures negative.   BRIEF HOSPITAL COURSE: This is a 54 year old male who presented to the hospital on 10/15/2013 due to high fevers and just not feeling well.   1.  Fever of unknown origin. The patient presented with high fevers on admission with a fever as high as 102-103. The source was although unclear on presentation but suspected to be pulmonary as the patient has underlying COPD with lung cancer and was having a productive cough. The patient was started on broad-spectrum IV antibiotics  including vancomycin and Zosyn. The patient had blood cultures, urine cultures, Clostridium difficile toxin,  and sputum cultures obtained, all of which are negative except for the sputum culture which is growing gram-negative rod which has not been identified yet.  The patient has been afebrile now for the past 48 hours and clinically feels stable and therefore is being discharged home on oral Levaquin as stated.  2.  Sepsis. This was the clinical diagnosis as the patient presented with fever, tachycardia. The source was likely pulmonary in nature given his history of COPD and lung cancer. His sputum culture is also positive, which is growing gram-negative rod currently not identified yet. The patient was treated with broad-spectrum IV antibiotics with vancomycin and cefepime. Currently being discharged on oral Levaquin for the next 7 days.  3.  COPD.  The patient had no acute COPD exacerbation. He will continue Advair and Spiriva.  4.  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 The patient was maintained on his Neurontin, he will resume that.  5.  Lung cancer. The patient is followed by Dr. Ma Hillock.  He will continue followup with him as an outpatient. He was scheduled for chemotherapy this week, but it was discontinued as the patient had acute respiratory illness as mentioned.  6.  Anxiety. The patient was maintained on his Wellbutrin, he will resume that.   CODE STATUS: The patient is a full code.   TIME SPENT ON DISCHARGE: 40 minutes.  ____________________________ Belia Heman. Verdell Carmine, MD vjs:bu D: 10/19/2013 14:39:10 ET T: 10/19/2013 15:19:55 ET JOB#: [id number]  cc: Belia Heman. Verdell Carmine, MD, <Dictator> Sandeep R. Ma Hillock, MD Henreitta Leber MD ELECTRONICALLY SIGNED 11/02/2013 14:58

## 2014-05-14 NOTE — Discharge Summary (Signed)
PATIENT NAME:  Darryl Hatfield, Darryl Hatfield MR#:  [id number] DATE OF BIRTH:  10-29-1960  DATE OF ADMISSION:  08/06/2013 DATE OF DISCHARGE:  08/06/2013  DISPOSITION: Likely discharged today pending stress test.   HISTORY AND HOSPITAL COURSE:  1.  This is a 54 year old male patient with history of recent diagnosis of lung cancer stage IIIB and history of COPD who came because of chest pain. The patient complained of left-sided chest pain associated with some dizziness, but did not have nausea or vomiting. The patient's primary doctor is Dr. Dema Severin and the patient had an echo done which showed EF of 35 to 40%. The patient admitted to hospitalist service for chest pain evaluation. The patient's cardiac cath in [facility] 5 years ago was normal with minimal blockage. The patient admitted to hospitalist service under observation and continued on aspirin, beta blockers and statins along with inhalers. The patient is going for nuclear medicine stress test. If that is normal, the patient will be discharged home. The patient will be seen by Dr. Esmond Plants and if he says he needs further workup, the patient will stay. Otherwise, the patient will be discharged home. The patient has stage IIIB lung cancer. The patient follows up with Dr. Ma Hillock as an outpatient for chemotherapy and radiation.  2.  COPD. The patient has no wheezing at this time. Continue his nebulizers, Spiriva and inhalers.   Condition is stable.   TIME SPENT ON DISCHARGE PREPARATION: More than 30 minutes.   ____________________________ Epifanio Lesches, MD sk:sb D: 08/06/2013 09:39:32 ET T: 08/06/2013 10:01:23 ET JOB#: [id number]  cc: Epifanio Lesches, MD, <Dictator> Epifanio Lesches MD ELECTRONICALLY SIGNED 08/20/2013 8:33

## 2014-05-14 NOTE — Consult Note (Signed)
No Known Allergies:   XRay:    25-Jun-15 14:17, Chest PA and Lateral  Chest PA and Lateral   REASON FOR EXAM:    assess for pleural effusion  COMMENTS:       PROCEDURE: DXR - DXR CHEST PA (OR AP) AND LATERAL  - Jul 15 2013  2:17PM     CLINICAL DATA:  54 year old male with recent chest tube placement  for tension pneumothorax on the right. Initial encounter. Right  upper lobe malignant lung mass by PET.    EXAM:  CHEST  2 VIEW    COMPARISON:  0626 hr the same day and earlier.    FINDINGS:  Stable percutaneous right pigtail chest tube. Small to moderate  volume right pneumothorax is new since this morning's film. No  definite pleural effusion.    Right chest porta cath re- identified. Spiculated right suprahilar  lung nodule unchanged.    Left suprahilar airspace disease which has developed recently and  was not present on 06/23/2013 has mildly regressed since 07/12/2013.  Stable cardiac size and mediastinal contours. No acute osseous  abnormality identified.     IMPRESSION:  1. Small to moderate volume right pneumothorax has recurred. Right  pigtail chest tube remains in place.  2. Mildly decreased left upper lobe airspace disease since  07/12/2013, most compatible with pneumonia.  3. Radiographically stable spiculated right perihilar lung mass and  hilar enlargement.      Electronically Signed    By: Lars Pinks M.D.    On: 07/15/2013 14:22         Verified By: Gwenyth Bender. HALL, M.D.,    Impression 1. post op PNA likely as a result from Atelectasis 2,. COPD stable 3  port placement with PTX 4. anxiety   Plan 1 zosyn will be fine to use 2. ISS 3. chest tube per dr Genevive Bi  4. copd stable no need for steroids  thank you will follow  [id number]   Electronic Signatures: Bettey Costa (MD)  (Signed 26-Jun-15 10:35)  Authored: Allergies, Radiology, Impression/Plan   Last Updated: 26-Jun-15 10:35 by Bettey Costa (MD)

## 2014-05-14 NOTE — Consult Note (Signed)
PATIENT NAME:  Darryl Hatfield, Darryl Hatfield MR#:  [id number] DATE OF BIRTH:  11-27-1960  DATE OF CONSULTATION:  11/04/2013  REFERRING PHYSICIAN:  Azucena Freed, MD CONSULTING PHYSICIAN:  Saud Bail R. Ma Hillock, MD  REASON FOR CONSULTATION: Lung cancer, admitted with cough and possible pneumonia.   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 He also has had some neuropathy recently. The patient has chronic dyspnea on exertion, weakness and fatigue. 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 The patient now admitted to the hospital with progressive cough and dyspnea on exertion, productive sputum and low temperature. Since hospitalization, temperature record here has been normal. Chest x-ray does not reveal any acute changes or obvious pneumonia. The patient is on 2 liters nasal cannula oxygen, states that he is beginning to feel better. Chronic weakness is unchanged. No chills. No new headaches.   PAST MEDICAL AND SURGICAL HISTORY: As in HPI above.   FAMILY HISTORY: Remarkable for heart disease. His father had malignancy of unknown type.   SOCIAL HISTORY: Chronic smoker 1 pack per day x40 years, quit June 2015. Works in [employer]. Denies alcohol or recreational drug usage.   ALLERGIES: No known drug allergies.   HOME MEDICATIONS: Advair Diskus 250/50 inhaler b.i.d., albuterol inhaler p.r.n., Xanax 1 mg t.i.d. p.r.n. anxiety, bupropion 200 mg extended release tablet b.i.d., gabapentin 300 mg t.i.d., Percocet 5/325 mg as needed for pain, Compazine 10 mg t.i.d. p.r.n., Spiriva 18 mcg inhaler once daily.   REVIEW OF  SYSTEMS: CONSTITUTIONAL: As in HPI. Currently denies chills or night sweats.  HEENT: No headaches or dizziness at rest. No epistaxis, ear or jaw pain. No sinus symptoms.  CARDIAC: No angina, palpitation, orthopnea, or PND.   LUNGS: As in HPI. No hemoptysis.  GASTROINTESTINAL: No nausea, vomiting, or diarrhea. No bright red blood in stools or melena.  GENITOURINARY: No dysuria or hematuria.  HEMATOLOGIC: No obvious bleeding symptoms.  SKIN: No new rashes or pruritus.  EXTREMITIES: Has chronic pain in the left hand and neuropathy which is unchanged. No new pain.  NEUROLOGIC: No new focal weakness, seizures or loss of consciousness.  ENDOCRINE: No polyuria or polydipsia.   PHYSICAL EXAMINATION: GENERAL: The patient is tired-looking, resting in bed, otherwise alert and oriented and converses appropriately. On nasal cannula oxygen, in no acute distress. No icterus.  VITAL SIGNS: 97.8, 98, 18, 106/75, 97% on 2 liters oxygen.  HEENT: Normocephalic, atraumatic. Extraocular movements intact. No oral thrush.  NECK: Negative for lymphadenopathy. HEART: S1 and S2, regular rate and rhythm.  LUNGS: Bilateral diminished breath sounds overall. No crepitations or rhonchi.  ABDOMEN: Soft, nontender, bowel sounds present.  EXTREMITIES: No major edema or cyanosis.  SKIN: No generalized rashes or major bruising.  NEUROLOGIC: Grossly nonfocal, cranial nerves intact.   DIAGNOSTIC DATA: Lab results: WBC 3100, ANC 1900, hemoglobin 11, platelets 130. blood culture negative so far.   Chest x-ray: No active cardiopulmonary disease, grossly similar appearance of known right upper lobe spiculated mass.   Creatinine 1.09 yesterday.   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 The patient has chronic obstructive  pulmonary disease. He is currently admitted with reportedly hypothermia along with chills and progressive cough and dyspnea on exertion. Chest x-ray does not show obvious pneumonia, but given his symptoms clinically could suggest pneumonia. WBC slightly below normal range but he is not neutropenic at this time. Agree with ongoing empiric antibiotic coverage since the patient is clinically improving. Continue to taper off antibiotics if cultures remain negative. Continue to monitor blood counts intermittently. No active pain issues at this time. We will add guaifenesin p.r.n. for his cough. No other acute oncology issues ongoing. We will continue to follow intermittently as indicated. If discharged soon, the patient advised to keep outpatient appointments and CT scan as previously scheduled.   Thank you for the referral. Please feel free to contact me for additional questions.  ____________________________ Rhett Bannister Ma Hillock, MD srp:sb D: 11/05/2013 08:46:00 ET T: 11/05/2013 09:14:29 ET JOB#: [id number]  cc: Bernyce Brimley R. Ma Hillock, MD, <Dictator> Alveta Heimlich MD ELECTRONICALLY SIGNED 11/05/2013 23:14

## 2014-05-14 NOTE — H&P (Signed)
PATIENT NAME:  Darryl Hatfield, Darryl Hatfield MR#:  [id number] DATE OF BIRTH:  July 03, 1960  DATE OF ADMISSION:  08/06/2013  PRIMARY CARE PHYSICIAN:  Torrie Mayers.  REFERRING PHYSICIAN:  Dr. Owens Shark.   CHIEF COMPLAINT:  Chest pain.   HISTORY OF PRESENT ILLNESS:  The patient is a 54 year old pleasant Caucasian male with past medical history of COPD, end stage IIIB lung cancer currently on chemotherapy, is presenting to the ED with a chief complaint of intermittent episodes of chest pain since Tuesday.  The patient initially was having chest pain on the left side of the chest and eventually he was experiencing sharp chest pain in the middle of the chest associated with dizziness, but denies any nausea or vomiting.  The patient went to see his primary care physician yesterday and had echocardiogram and EKG done.  Echocardiogram has revealed left ventricular ejection fraction of 35% to 40%.  As the chest pain is getting worse the patient came into the ED.  He was given morphine and the chest pain is completely resolved.  During my examination denies any chest pain or shortness of breath.  Wife and son are at bedside.  No similar complaints in the past.  The patient had cardiac catheterization in [facility] approximately five years ago and he was found with small blockage in the bundle branch area, regarding which no interventions were done at this time.  Following that, he never had any stress test done.  Never diagnosed with coronary artery disease.  Currently the patient is with no chest pain, but complaining of right-sided leg pain.  No other complaints.  PAST MEDICAL HISTORY:  Chronic history of COPD, recent diagnosis of stage IIIB lung cancer, status post radiation therapy, currently getting chemotherapy, last chemo was on Monday by Dr. Leia Alf.   PAST SURGICAL HISTORY:  Port-A-Cath placement, chest tube placement for  pneumothorax after Port-A-Cath placement.   ALLERGIES:  No known drug allergies.    PSYCHOSOCIAL HISTORY:  Lives at home with wife and son.  The patient has a 40 pack-year history of smoking, but quit smoking recently.  Denies any alcohol or illicit drug usage.   FAMILY HISTORY:  Father has some malignancy, unknown primary.  Mother has history of COPD and deceased with COPD.  Grandmother has heart problems.   HOME MEDICATIONS:  Bupropion 200 mg q. 12 hours, alprazolam 1 mg by mouth q. 8 hours as needed, albuterol ipratropium inhalation 4 times a day, Percocet 5/325 1 to 2 tablets by mouth q. 4 hours as needed.  The patient is on nicotine 21 mg patch, dexamethasone 4 mg on the day of chemotherapy.   REVIEW OF SYSTEMS: CONSTITUTIONAL:  Denies any fever.  Complaining of fatigue.  EYES:  Denies blurry vision, double vision.  Denies any glaucoma.  EARS, NOSE, THROAT:  Denies epistaxis, discharge, tinnitus.  RESPIRATION:  Denies cough.  Has chronic history of COPD.  Denies any hemoptysis.  CARDIOVASCULAR:  Complaining of intermittent episodes of chest pain in the left side of the chest and also in the midsternal area associated with dizziness.  Denies any syncope or palpitations.  GASTROINTESTINAL:  Denies nausea, vomiting, diarrhea, abdominal pain, hematemesis, melena.  GENITOURINARY:  No dysuria, hematuria, renal calculus or frequency.  ENDOCRINE:  Denies polyuria, nocturia, thyroid problems.  HEMATOLOGY AND LYMPHATIC:  Has chronic history of anemia.  Denies any easy bruising or bleeding.  INTEGUMENTARY:  No acne, rash, lesions.  MUSCULOSKELETAL:  Complaining of right leg pain, but denies any back pain or  gout.  NEUROLOGIC:  Denies vertigo, ataxia, dementia.  Has chronic history of anxiety. PSYCHIATRIC:  Denies any ADD, OCD, insomnia.   PHYSICAL EXAMINATION:  VITAL SIGNS:  Temperature 97.8, pulse 105, respirations 18, blood pressure 119/81, pulse ox 97% on room air.  GENERAL APPEARANCE:  Not under acute distress.  Moderately built and thin-looking Caucasian male in no  apparent distress.  HEENT:  Normocephalic, atraumatic.  Pupils are equal, reacting to light and accommodation.  No scleral icterus.  No conjunctival injection.  No sinus tenderness.  No postnasal drip.  Moist mucous membranes.  NECK:  Supple.  No JVD.  No thyromegaly.  Range of motion is intact.  LUNGS:  Clear to auscultation bilaterally.  No accessory muscle usage.  No anterior chest wall tenderness on palpation.  Port-A-Cath is intact on the right side of the chest wall.  CARDIOVASCULAR:  S1, S2 normal.  Regular rate and rhythm.  No murmurs.  GASTROINTESTINAL:  Soft.  Bowel sounds are positive in all four quadrants.  Nontender, nondistended.  No hepatosplenomegaly.  No masses felt.  NEUROLOGIC:  Awake, alert and oriented x 3.  Cranial nerves II through XII are grossly intact.  Motor and sensory are intact.  Reflexes are 2+.  EXTREMITIES:  No cyanosis.  No clubbing.  MUSCULOSKELETAL:  No joint effusion, tenderness, erythema.  PSYCHIATRIC:  Normal mood and affect.   LABORATORY AND IMAGING STUDIES:  Portable chest x-ray, continued interval improvement of the left upper lobe opacification, likely resolving infection or inflammatory process, stable right hilar mass and adenopathy and right upper lobe nodule.  CT angiogram of the chest:  No pulmonary embolism.  Right hilar lymphadenopathy, most consistent with metastasis given the  16 mm right upper lobe lung cancer encases the right pulmonary artery lobar branches, mild narrowing upper lobe pulmonary arteries.  Spiculated left upper lobe at least 18 x 26 cm irregular mass with central cavitation abutting the pleural airspace, new from prior PET scan, corresponding to radiographic abnormality.  This could reflect pneumonia, less likely neoplasm considering acuity.  Recommend follow-up to verify improvement.  A 12-lead EKG:  Sinus tachycardia at 107 beats per minute, incomplete right bundle branch block.  No acute ST-T wave changes.  BMP:  BUN is 21,  creatinine is normal.  Anion gap is 4.  The rest of the Chem-8 is normal.  LFTs:  Except albumin which is at 3.3, the rest of the LFTs are normal.  Troponin 0.02.  CBC:  WBC normal.  Hemoglobin at 12.3, hematocrit 37.8, platelets are 214.  Recent echocardiogram which was done on June 15th has revealed left ventricular ejection fraction of 35% to 40%, moderate decrease in global left ventricular systolic function.    ASSESSMENT AND PLAN:  A 54 year old Caucasian male presenting to the Emergency Department with a chief complaint of chest pain, started on Tuesday, intermittent in nature, associated with dizziness, will be admitted with the following assessment and plan. 1.  Atypical chest pain, rule out acute myocardial infarction.  We will admit the patient to telemetry.  We will perform Myoview scan.  Cardiology consult is placed to Dr. Rockey Situ.  Acute coronary syndrome protocol with oxygen, nitroglycerin, aspirin and statin.  We will hold beta blocker a.m. dose as the patient is going to get Myoview.  The patient is nothing by mouth and we will provide intravenous fluids.  2.  Recent diagnosis of stage IIIB lung cancer.  Continue chemotherapy as scheduled.  Follow up with Dr. Ma Hillock as an outpatient as  recommended.  3.  Chronic history of chronic obstructive pulmonary disease, currently with no exacerbation.  We will provide him nebulizer treatments as needed basis.  4.  History of smoking.  Currently, the patient stopped smoking and he is on nicotine patch.  Once acute myocardial infarction is ruled, we will resume his nicotine patch.  Continue bupropion.  5.  We will provide him gastrointestinal and deep vein thrombosis prophylaxis.   Plan of care discussed with the patient and his wife at bedside.  They both verbalized understanding of the plan.    CODE STATUS:  FULL CODE.  Wife is the medical power of attorney.   Total time spent on admission is 50 minutes.    ____________________________ Nicholes Mango, MD ag:ea D: 08/06/2013 03:05:09 ET T: 08/06/2013 04:15:28 ET JOB#: [id number]  cc: Nicholes Mango, MD, <Dictator> Minna Merritts, MD Tamela Gammon. [place]   Nicholes Mango MD ELECTRONICALLY SIGNED 08/09/2013 0:39

## 2014-05-14 NOTE — H&P (Signed)
PATIENT NAME:  Darryl Hatfield, Darryl Hatfield MR#:  [id number] DATE OF BIRTH:  28-Jul-1960  DATE OF ADMISSION:  06/21/2013  REFERRING PHYSICIAN: Dr. Marjean Donna.  PRIMARY CARE PHYSICIAN: [facility].   CHIEF COMPLAINT: Shortness of breath, cough, productive sputum.   HISTORY OF PRESENT ILLNESS: This is a 54 year old male with history of 40 years of smoking, presents with complaints of shortness of breath, cough, productive sputum. The patient reports he has been in ED, in the past for COPD even though he was never officially diagnosed with it. He presents with complaints of shortness of breath, cough, productive sputum over the last 2 days. The patient was hypoxic in ED, saturating in the mid 80s where he required oxygen. The patient has significant wheezing upon presentation where he received 125 mg of IV Solu-Medrol,  multiple nebulizer treatment with improvement of his symptoms. The patient's chest x-ray did show lung nodule where he received CT chest with contrast which did show a right lung mass suspicious for primary bronchogenic carcinoma associated with right hilar and right pretracheal mediastinal adenopathy consistent with metastatic adenopathy. He denies any fever, any chills, any dysuria, any polyuria, any chest pain. Hospitalist service requested to admit the patient for further treatment of his COPD and evaluation of his lung mass.   PAST MEDICAL HISTORY: COPD.    PAST SURGICAL HISTORY: None.   SOCIAL HISTORY: The patient works in [employer]. He smokes 1 pack per day over the last 48 years. No alcohol. No illicit drug use.   FAMILY HISTORY: Significant for coronary artery disease.   ALLERGIES: No known drug allergies.   HOME MEDICATIONS: Wellbutrin orally 2 times a day. Dose is currently unknown.   REVIEW OF SYSTEMS:   GENERAL: Denies fever, chills, fatigue, weakness.  EYES: Denies blurry vision, double vision, inflammation. EARS, NOSE, AND THROAT: Denies  hearing loss,  epistaxis. RESPIRATORY: Reports cough, productive sputum, shortness of breath and wheezing and history of COPD.  CARDIOVASCULAR: Denies chest pain, edema, palpitation, syncope.  GASTROINTESTINAL: Reports nausea, vomiting, abdominal pain, hematemesis.  GENITOURINARY: Denies dysuria, hematuria, renal colic.  ENDOCRINE: Denies polyuria, polydipsia, heat or cold intolerance.  HEMATOLOGY: Denies anemia, easy bruising, bleeding diathesis.  INTEGUMENT: Denies acne, rash, or skin lesion.  MUSCULOSKELETAL: Denies any gout, swelling, cramps, arthritis.  NEUROLOGIC: Denies CVA, TIA, tremors or ataxia.   PSYCHIATRIC: Denies anxiety, insomnia, or depression.   PHYSICAL EXAMINATION:  VITAL SIGNS: Temperature 98, pulse 98, respiratory rate 20, blood pressure 135/90 saturating 96% on oxygen.  GENERAL: Elderly male, thin-appearing looks comfortable in no apparent distress.  HEENT: Head atraumatic, normocephalic. Pupils are equal and reactive to light. Pink conjunctivae. Anicteric sclerae. Moist oral mucosa.  NECK: Supple. No thyromegaly. No JVD.  CHEST: Scattered wheezing, good air entry bilaterally. No rales or rhonchi.  CARDIOVASCULAR: S1, S2 heard. No rubs, murmurs, gallops.  ABDOMEN: Soft, nontender, nondistended. Bowel sounds present.  EXTREMITIES: No edema. No clubbing. No cyanosis. Pedal and radial pulses +2 bilaterally.  PSYCHIATRIC: Appropriate affect. Awake, alert x 3. Intact judgment and insight.  NEUROLOGIC: Cranial nerves grossly intact. Motor 5/5. No focal deficits.  SKIN: Normal skin turgor. Warm and dry.  MUSCULOSKELETAL: No joint effusion or erythema.   PERTINENT LABORATORY DATA: Glucose 142, BUN 16, creatinine 0.81, sodium 134, potassium 4.1, chloride 97, CO2 of 30. Troponin less than 0.02. White blood cells 7.3, hemoglobin 14.9, hematocrit 46.4, platelet 263,000.   IMAGING: CT chest with contrast showing right upper lobe spiculated mass suspicious for a primary bronchogenic carcinoma  with metastatic adenopathy.   ASSESSMENT AND PLAN:  1. Chronic obstructive pulmonary disease exacerbation. The patient presents with hypoxia significant wheezing, productive sputum, he will be started on IV Solu-Medrol, DuoNeb every 4 hours and p.r.n. albuterol. Given the fact he is having productive sputum, green in color, we will start him on levofloxacin.  2. The lung mass on CT chest. This is highly suspicious for primary bronchogenic carcinoma with metastatic adenopathy. We will consult oncology and pulmonary service.  3. Tobacco abuse: The patient was counseled. He will be started on nicotine patch.  4. Deep vein thrombosis prophylaxis. Subcutaneous heparin.   CODE STATUS: Full code, but he does not wish to remain on life support if has poor prognosis.   TOTAL TIME SPENT ON ADMISSION AND PATIENT CARE: 55 minutes.     ____________________________ Albertine Patricia, MD dse:lt D: 06/21/2013 04:42:46 ET T: 06/21/2013 06:45:06 ET JOB#: [id number]  cc: Albertine Patricia, MD, <Dictator> Skylar Priest Graciela Husbands MD ELECTRONICALLY SIGNED 07/03/2013 23:40

## 2014-05-14 NOTE — Discharge Summary (Signed)
Dates of Admission and Diagnosis:  Date of Admission 04-Nov-2013   Date of Discharge 06-Nov-2013   Admitting Diagnosis acute bronchitis   Final Diagnosis Acute bronchitis Chronic Obstructive Pulmonary Disease exacerbation Right lung cancer on chemotherapy Neutropenia due to chemotherapy.    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 He also has had some neuropathy recently. The patient has chronic dyspnea on exertion, weakness and fatigue. 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 The patient now admitted to the hospital with progressive cough and dyspnea on exertion, productive sputum and low temperature. Since hospitalization, temperature record here has been normal. Chest x-ray does not reveal any acute changes or obvious pneumonia. The patient is on 2 liters nasal cannula oxygen, states that he is beginning to feel better. Chronic weakness is unchanged. No chills. No new headaches.   Allergies:  No Known Allergies:   Pertinent Past History:  Pertinent Past History right lung mass COPD  Neuropathy   Hospital Course:  Hospital Course 1.  Febrile neutropenia, likely pneumonia/ bronchitis with cough and green sputum,    Neutropenic precautions, blood cultures, sputum cultures, intravenous antibiotics vancomycin and cefepime and follow up CBC.      Pt is seen by Oncology- not a dangerous level of neutropenia, so just treat infection. 2.  Cough with greenish productive sputum, acute bronchitis, ruled out pneumonia.    Blood and sputum cultures, oxygen supplementation, vigorous nebulizers,  intravenous antibiotics.  3.  Right-sided lung cancer, undergoing chemotherapy per oncologist. Most recent chemotherapy was on 10/25/2013.oncology for further recommendations.  4.  Chronic obstructive pulmonary disease, stable on home nebulizers and inhalers. Continue vigorous nebulizers, oxygen supplementation, Spiriva, and Advair.  5.  History of generalized anxiety disorder, stable on Xanax and Wellbutrin.   Condition on Discharge Stable   Code Status:  Code Status Full Code   DISCHARGE INSTRUCTIONS HOME MEDS:  Medication Reconciliation: Patient's Home Medications at Discharge:     Medication Instructions  albuterol-ipratropium 2.5 mg-0.5 mg/3 ml inhalation solution  4 milliliter(s) inhaled 4 times a day, As Needed - for Shortness of Breath - for Wheezing    tiotropium 18 mcg inhalation capsule  1 cap(s) inhaled once a day   bupropion 200 mg/12 hours oral tablet, extended release  1 tab(s) orally 2 times a day   prochlorperazine 10 mg oral tablet  1 tab(s) orally 3 times a day, As Needed   dexamethasone 4 mg oral tablet  Take 3 tablets (12 mg) at 10PM  and take 3 tablets (12 mg) at 6AM  the day of each Taxol chemotherapy  (first dose of Taxol planned on 08/02/13)   advair diskus 250 mcg-50 mcg inhalation powder  1 puff(s) inhaled 2 times a day   albuterol 2.5 mg/3 ml (0.083%) inhalation solution  3 milliliter(s) inhaled every 6 hours, As Needed   alprazolam 1 mg oral tablet  1 tab(s) orally 3 times a day as needed for anxiety   gabapentin 100 mg oral capsule  3 cap(s) orally 3 times a day   ceftin 500 mg oral tablet  1 tab(s) orally 2 times a day x 3 days   guaifenesin  5 milliliter(s) orally every 4 hours,  As Needed, cough , As needed, cough   oxycodone 5 mg oral tablet  Take 1 - 2  tablets orally every 4 hours as needed for pain.    STOP TAKING THE FOLLOWING MEDICATION(S):    percocet 5/325: 1-2 tab(s) orally every 4 to 6 hours, As Needed  Physician's Instructions:  Diet  Regular   Activity Limitations As tolerated   Return to Work Not Applicable   Time frame for Follow Up Appointment 1-2 weeks  Cancer center     [place], [place]): [facility], [address] [place], [place], [place], [place], [place]   WHITE, [facility] J(Family Physician):   Electronic Signatures: Vaughan Basta (MD)  (Signed 23-Oct-15 20:41)  Authored: ADMISSION DATE AND DIAGNOSIS, CHIEF COMPLAINT/HPI, Allergies, PERTINENT PAST HISTORY, HOSPITAL COURSE, [place], PATIENT INSTRUCTIONS, Follow Up Physician   Last Updated: 23-Oct-15 20:41 by Vaughan Basta (MD)

## 2014-05-22 ENCOUNTER — Other Ambulatory Visit: Payer: Self-pay | Admitting: *Deleted

## 2014-05-22 DIAGNOSIS — C3491 Malignant neoplasm of unspecified part of right bronchus or lung: Secondary | ICD-10-CM

## 2014-05-23 ENCOUNTER — Other Ambulatory Visit: Payer: Self-pay | Admitting: *Deleted

## 2014-05-23 ENCOUNTER — Inpatient Hospital Stay: Payer: BLUE CROSS/BLUE SHIELD

## 2014-05-23 ENCOUNTER — Ambulatory Visit: Payer: Self-pay

## 2014-05-23 ENCOUNTER — Inpatient Hospital Stay: Payer: BLUE CROSS/BLUE SHIELD | Attending: Internal Medicine

## 2014-05-23 DIAGNOSIS — C3490 Malignant neoplasm of unspecified part of unspecified bronchus or lung: Secondary | ICD-10-CM

## 2014-05-23 DIAGNOSIS — C3491 Malignant neoplasm of unspecified part of right bronchus or lung: Secondary | ICD-10-CM

## 2014-05-23 DIAGNOSIS — I509 Heart failure, unspecified: Secondary | ICD-10-CM | POA: Insufficient documentation

## 2014-05-23 DIAGNOSIS — Z87891 Personal history of nicotine dependence: Secondary | ICD-10-CM | POA: Insufficient documentation

## 2014-05-23 DIAGNOSIS — R5382 Chronic fatigue, unspecified: Secondary | ICD-10-CM | POA: Insufficient documentation

## 2014-05-23 DIAGNOSIS — R0600 Dyspnea, unspecified: Secondary | ICD-10-CM | POA: Insufficient documentation

## 2014-05-23 DIAGNOSIS — J449 Chronic obstructive pulmonary disease, unspecified: Secondary | ICD-10-CM | POA: Insufficient documentation

## 2014-05-23 DIAGNOSIS — D72819 Decreased white blood cell count, unspecified: Secondary | ICD-10-CM | POA: Insufficient documentation

## 2014-05-23 LAB — HEPATIC FUNCTION PANEL
ALBUMIN: 4 g/dL (ref 3.5–5.0)
ALT: 16 U/L — ABNORMAL LOW (ref 17–63)
AST: 20 U/L (ref 15–41)
Alkaline Phosphatase: 85 U/L (ref 38–126)
BILIRUBIN DIRECT: 0.1 mg/dL (ref 0.1–0.5)
BILIRUBIN INDIRECT: 0.3 mg/dL (ref 0.3–0.9)
TOTAL PROTEIN: 7 g/dL (ref 6.5–8.1)
Total Bilirubin: 0.4 mg/dL (ref 0.3–1.2)

## 2014-05-23 LAB — CREATININE, SERUM
Creatinine, Ser: 0.74 mg/dL (ref 0.61–1.24)
GFR calc Af Amer: >60 mL/min (ref >60)

## 2014-05-23 LAB — CBC
HCT: 40.8 % (ref 40.0–52.0)
Hemoglobin: 13.6 g/dL (ref 13.0–18.0)
MCH: 30.1 pg (ref 26.0–34.0)
MCHC: 33.3 g/dL (ref 32.0–36.0)
MCV: 90.5 fL (ref 80.0–100.0)
Platelets: 205 10*3/uL (ref 150–440)
RBC: 4.51 MIL/uL (ref 4.40–5.90)
RDW: 13.2 % (ref 11.5–14.5)
WBC: 5.8 10*3/uL (ref 3.8–10.6)

## 2014-05-23 MED ORDER — SODIUM CHLORIDE 0.9 % IJ SOLN
10.0000 mL | Freq: Once | INTRAMUSCULAR | Status: AC
  Administered 2014-05-23: 10 mL via INTRAVENOUS
  Filled 2014-05-23: qty 10

## 2014-05-23 MED ORDER — HEPARIN SOD (PORK) LOCK FLUSH 100 UNIT/ML IV SOLN
500.0000 [IU] | Freq: Once | INTRAVENOUS | Status: AC
  Administered 2014-05-23: 500 [IU] via INTRAVENOUS

## 2014-05-23 MED ORDER — HEPARIN SOD (PORK) LOCK FLUSH 100 UNIT/ML IV SOLN
INTRAVENOUS | Status: AC
  Filled 2014-05-23: qty 5

## 2014-05-24 ENCOUNTER — Ambulatory Visit: Payer: Self-pay | Admitting: Internal Medicine

## 2014-05-24 ENCOUNTER — Ambulatory Visit
Admission: RE | Admit: 2014-05-24 | Discharge: 2014-05-24 | Disposition: A | Discharge status: Home / Self Care | Payer: BLUE CROSS/BLUE SHIELD | Source: Ambulatory Visit | Attending: Internal Medicine | Admitting: Internal Medicine

## 2014-05-24 DIAGNOSIS — S32031A Stable burst fracture of third lumbar vertebra, initial encounter for closed fracture: Secondary | ICD-10-CM | POA: Insufficient documentation

## 2014-05-24 DIAGNOSIS — Z9221 Personal history of antineoplastic chemotherapy: Secondary | ICD-10-CM | POA: Diagnosis not present

## 2014-05-24 DIAGNOSIS — C3490 Malignant neoplasm of unspecified part of unspecified bronchus or lung: Secondary | ICD-10-CM

## 2014-05-24 DIAGNOSIS — M488X9 Other specified spondylopathies, site unspecified: Secondary | ICD-10-CM | POA: Insufficient documentation

## 2014-05-24 DIAGNOSIS — C3491 Malignant neoplasm of unspecified part of right bronchus or lung: Secondary | ICD-10-CM | POA: Diagnosis not present

## 2014-05-24 DIAGNOSIS — R911 Solitary pulmonary nodule: Secondary | ICD-10-CM | POA: Insufficient documentation

## 2014-05-24 HISTORY — DX: Heart failure, unspecified: I50.9

## 2014-05-24 MED ORDER — IOHEXOL 300 MG/ML  SOLN
80.0000 mL | Freq: Once | INTRAMUSCULAR | Status: AC | PRN
Start: 2014-05-24 — End: 2014-05-24
  Administered 2014-05-24: 75 mL via INTRAVENOUS

## 2014-05-25 ENCOUNTER — Other Ambulatory Visit: Payer: Self-pay | Admitting: *Deleted

## 2014-05-25 ENCOUNTER — Inpatient Hospital Stay (HOSPITAL_BASED_OUTPATIENT_CLINIC_OR_DEPARTMENT_OTHER): Payer: BLUE CROSS/BLUE SHIELD | Admitting: Internal Medicine

## 2014-05-25 VITALS — BP 110/77 | HR 120 | Temp 96.4°F | Ht 68.11 in | Wt 122.1 lb

## 2014-05-25 DIAGNOSIS — C3491 Malignant neoplasm of unspecified part of right bronchus or lung: Secondary | ICD-10-CM | POA: Diagnosis not present

## 2014-05-25 DIAGNOSIS — D72819 Decreased white blood cell count, unspecified: Secondary | ICD-10-CM

## 2014-05-25 DIAGNOSIS — R0609 Other forms of dyspnea: Secondary | ICD-10-CM | POA: Diagnosis not present

## 2014-05-25 DIAGNOSIS — J449 Chronic obstructive pulmonary disease, unspecified: Secondary | ICD-10-CM

## 2014-05-25 DIAGNOSIS — C3411 Malignant neoplasm of upper lobe, right bronchus or lung: Secondary | ICD-10-CM

## 2014-05-25 DIAGNOSIS — Z79899 Other long term (current) drug therapy: Secondary | ICD-10-CM

## 2014-05-25 NOTE — Progress Notes (Signed)
Patient had a seizure on 04/05/14. Patient suffered bilateral humerus fractures and L3 vertabae fracture. He has been seeing doctor at [facility] for his spine and doctor, Dr. Roland Rack at [place].

## 2014-05-26 ENCOUNTER — Telehealth: Payer: Self-pay | Admitting: *Deleted

## 2014-05-26 NOTE — Telephone Encounter (Signed)
Asking for a call to explain why they were told everything was fine when the report says there is a nodule that has increased in size and why they were never made aware that he even had  a nodule . Stated she expects a return call regarding this

## 2014-05-26 NOTE — Telephone Encounter (Signed)
Have d/w patient's wife Darryl Hatfield and explained that I did discuss yesterday about the nodule on ,left side but it is too small to characterize and that he is at high risk of recurrent/metastatic lung cancer given advanced stage disease. She is also concerned about back pains.  Have ordered PET scan for further restaging lung cancer, please schedule and notify them. Thanks.

## 2014-06-01 ENCOUNTER — Telehealth: Payer: Self-pay | Admitting: *Deleted

## 2014-06-01 ENCOUNTER — Ambulatory Visit
Admission: RE | Admit: 2014-06-01 | Discharge: 2014-06-01 | Disposition: A | Discharge status: Home / Self Care | Payer: BLUE CROSS/BLUE SHIELD | Source: Ambulatory Visit | Attending: Internal Medicine | Admitting: Internal Medicine

## 2014-06-01 DIAGNOSIS — R634 Abnormal weight loss: Secondary | ICD-10-CM | POA: Diagnosis present

## 2014-06-01 DIAGNOSIS — C3411 Malignant neoplasm of upper lobe, right bronchus or lung: Secondary | ICD-10-CM | POA: Diagnosis not present

## 2014-06-01 LAB — GLUCOSE, CAPILLARY: GLUCOSE-CAPILLARY: 90 mg/dL (ref 70–99)

## 2014-06-01 MED ORDER — FLUDEOXYGLUCOSE F - 18 (FDG) INJECTION
12.5400 | Freq: Once | INTRAVENOUS | Status: AC | PRN
  Administered 2014-06-01: 12.54 via INTRAVENOUS

## 2014-06-01 NOTE — Telephone Encounter (Signed)
Asking how they are to get PET results. No appt was made to go over results, Will you call them with results? Please let her know

## 2014-06-02 NOTE — Telephone Encounter (Signed)
Have sent inbasket to Sherry/Josh to call him. Thanks.

## 2014-06-02 NOTE — Progress Notes (Signed)
Called wife and gave her the no residual disease found and she specifically asked about the back.  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  She would like copy of report mailed to her and I printed it and placed in the mail.

## 2014-06-09 NOTE — Progress Notes (Signed)
[facility]  [phone]) [phone] [phone]) [phone]     ID: Mia Creek OB: 07/14/1960  MR#: [id number]  JYN#:[id number]  Patient Care Team: No Pcp Per Patient as PCP - General (General Practice)  CHIEF COMPLAINT/DIAGNOSIS:  Squamous cell carcinoma of the right lung  T1, N2, M0 tumor stage IIIA/B diagnosed on 07/02/13 - EBUS biopsy of RIGHT LOWER PARATRACHEAL ADENOPATHY, EBUS ASSISTED BIOPSY: - METASTATIC NON-SMALL CELL CARCINOMA, FAVOR SQUAMOUS CELL CARCINOMA. Part B: RIGHT HILAR ADENOPATHY, EBUS ASSISTED BIOPSY: - METASTATIC NON-SMALL CELL CARCINOMA, FAVOR SQUAMOUS CELL CARCINOMA. Completed concurrent radiation with chemotherapy with Taxol/Carboplatin (cycle 4 day 15 chemo was on 08/02/13).    HISTORY OF PRESENT ILLNESS:  Patient returns for continued oncology evaluation for lung Ca as above, was seen few months ago. He had followup CT scan of the chest done for followup of lung cancer. States he had seizure in March and sustained fracture ribs, taking medication. Appetite is steady. He has chronic fatigue and mild dyspnea on exertion which is at baseline. No new hemoptysis or chest pain. He denies any seizure new headaches.  No recurrent pain in the left hand. He has chronic hip pain and rib pains, no new bone lesions. No fevers.   REVIEW OF SYSTEMS:   ROS As in HPI above. In addition, no fever, chills or sweats. No new headaches or focal weakness.  No new mood disturbances. No  sore throat, cough, shortness of breath, sputum, hemoptysis or chest pain. No dizziness or palpitation. No abdominal pain, constipation, diarrhea, dysuria or hematuria. No new skin rash or bleeding symptoms. No new paresthesias in extremities. PS ECOG 2 Otherwise, a complete review of systems is negative.  PAST MEDICAL HISTORY: Past Medical History  Diagnosis Date  . COPD (chronic obstructive pulmonary disease)   . Lung cancer 2015    RIght   . 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          Seizure, rib fractures March 2016.    FAMILY HISTORY: Remarkable for heart disease. Father had unknown type of malignancy.     SOCIAL HISTORY: Chronic smoker, 1 pack a day for 40 years, quit June 2015. Works in the [employer]. Denies alcohol or recreational drug usage.    Allergies  Allergen Reactions  . No Known Allergies     Current Outpatient Prescriptions  Medication Sig Dispense Refill  . albuterol (PROVENTIL) (2.5 MG/3ML) 0.083% nebulizer solution Take 2.5 mg by nebulization every 6 (six) hours as needed for wheezing or shortness of breath.    Marland Kitchen buPROPion (WELLBUTRIN SR) 200 MG 12 hr tablet Take 200 mg by mouth 2 (two) times daily.    . cyclobenzaprine (FLEXERIL) 10 MG tablet Take 10 mg by mouth 3 (three) times daily as needed for muscle spasms.    . Fluticasone-Salmeterol (ADVAIR) 250-50 MCG/DOSE AEPB Inhale 1 puff into the lungs 2 (two) times daily.    Marland Kitchen gabapentin (NEURONTIN) 100 MG capsule Take 100 mg by mouth 3 (three) times daily.    Marland Kitchen ipratropium-albuterol (DUONEB) 0.5-2.5 (3) MG/3ML SOLN Take 4 mLs by nebulization every 6 (six) hours as needed.    Marland Kitchen LORazepam (ATIVAN) 0.5 MG tablet Take 0.5 mg by mouth 3 (three) times daily.    Marland Kitchen tiotropium (SPIRIVA) 18 MCG inhalation capsule Place  18 mcg into inhaler and inhale daily.     No current facility-administered medications for this visit.    OBJECTIVE: Filed Vitals:   05/25/14 1357  BP: 110/77  Pulse: 120  Temp: 96.4 F (35.8 C)     Body mass index is 18.51 kg/(m^2).    ECOG FS:2 - Symptomatic, <50% confined to bed  GENERAL: Alert and oriented and in no acute distress. No icterus. HEENT: EOMs intact.  No cervical lymphadenopathy. CVS: S1S2, regular LUNGS: Bilaterally clear to auscultation, no rhonchi. ABDOMEN: Soft,  nontender. No hepatomegaly clinically.  NEURO: grossly nonfocal, cranial nerves are intact. Gait unremarkable. EXTREMITIES: No pedal edema.   Lab Results  Component Value Date   WBC 5.8 05/23/2014   NEUTROABS 1.9 02/03/2014   HGB 13.6 05/23/2014   HCT 40.8 05/23/2014   MCV 90.5 05/23/2014   PLT 205 05/23/2014      Chemistry      Component Value Date/Time   NA 142 01/17/2014 1028   NA 140 03/02/2009 1729   K 4.0 01/17/2014 1028   K 3.6 03/02/2009 1729   CL 102 01/17/2014 1028   CL 104 03/02/2009 1729   CO2 30 01/17/2014 1028   BUN 13 01/17/2014 1028   BUN 12 03/02/2009 1729   CREATININE 0.74 05/23/2014 1222   CREATININE 0.91 02/03/2014 0842      Component Value Date/Time   CALCIUM 9.0 01/17/2014 1028   ALKPHOS 85 05/23/2014 1222   ALKPHOS 74 02/03/2014 0842   AST 20 05/23/2014 1222   AST 17 02/03/2014 0842   ALT 16* 05/23/2014 1222   ALT 26 02/03/2014 0842   BILITOT 0.4 05/23/2014 1222      STUDIES:   05/24/14 - CT Scan of Chest. FINDINGS: Mediastinum/Nodes: There are no enlarged mediastinal, hilar or axillary lymph nodes.Right paratracheal density on image 24 is stable, attributed to a combination of unfilled azygos vein and post radiation scarring. The thyroid gland, trachea and esophagus demonstrate no significant findings. The heart size is normal. There is no pericardial effusion.Right IJ Port-A-Cath tip extends to the upper right atrium. Mild atherosclerosis appears unchanged. Lungs/Pleura: There is no pleural effusion. There is progressive volume loss and post radiation scarring centrally in the right suprahilar region. Previously demonstrated spiculated right upper lobe nodule is partly obscured by adjacent radiation change but stable (image 25). There is stable linear scarring in the left upper lobe, measuring approximately 13 x 4 mm on image 22. Left lower lobe nodule measuring 4 mm on image 45 is slightly larger. No other pulmonary nodules  identified. Upper abdomen: Unremarkable. There is no adrenal mass. Musculoskeletal/Chest wall: No chest wall mass or osseous metastases demonstrated. There is a severe burst fracture at L3 as demonstrated on the recent examination. There is associated osseous retropulsion (approximately 10 mm) with progressive loss of vertebral body height. IMPRESSION: 1. Right upper lobe radiation changes with partial obscuration of underlying spiculated right upper lobe lesion. 2. No evidence of local recurrence or progressive metastatic disease. A small left lower lobe nodule appears slightly larger. 3. Severe burst fracture at L3 as demonstrated on examination of 6 weeks ago. There is progressive loss of vertebral body height with significant osseous retropulsion.   ASSESSMENT / PLAN:   1. 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 Part B: RIGHT HILAR ADENOPATHY, EBUS  ASSISTED BIOPSY: - METASTATIC NON-SMALL CELL CARCINOMA, FAVOR SQUAMOUS CELL CARCINOMA - have independently reviewed CT chest and reviewed with patient/wife present. CT reports Right upper lobe radiation changes with partial obscuration of underlying spiculated right upper lobe lesion, no evidence of local recurrence or progressive metastatic disease. A small left lower lobe nodule appears slightly larger, severe burst fracture at L3 as demonstrated on examination of 6 weeks ago, there is progressive loss of vertebral body height with significant osseous retropulsion.Will get PET scan for further evaluation of lung nodule and evaluate for any bony metastasis. If scan is negative for recurrent malignancy, then plan is continued surveillance. Will get CBC, Cr, LFT at 18 weeks and schedule followup CT scan of chest with contrast for restaging lung  cancer, and see him back after it is done. 2. Chronic dyspnea on exertion - likely from COPD, symp[toms at baseline.  3. Portacath - continue port flush q6 weeks. 4. Leucopenia - mild, asymptomatic, ANC is adequate. Continue to monitor. In between visits, patient advised to call or come to ER in case of any new symptoms or acute sickness. He is agreeable to this plan.   Leia Alf, MD   06/09/2014 5:23 PM

## 2014-06-10 ENCOUNTER — Other Ambulatory Visit: Payer: Self-pay | Admitting: Internal Medicine

## 2014-06-10 DIAGNOSIS — C3411 Malignant neoplasm of upper lobe, right bronchus or lung: Secondary | ICD-10-CM

## 2014-06-27 ENCOUNTER — Inpatient Hospital Stay: Payer: BLUE CROSS/BLUE SHIELD | Attending: Internal Medicine

## 2014-06-27 DIAGNOSIS — C801 Malignant (primary) neoplasm, unspecified: Secondary | ICD-10-CM

## 2014-06-27 DIAGNOSIS — C3491 Malignant neoplasm of unspecified part of right bronchus or lung: Secondary | ICD-10-CM | POA: Insufficient documentation

## 2014-06-27 DIAGNOSIS — Z452 Encounter for adjustment and management of vascular access device: Secondary | ICD-10-CM | POA: Insufficient documentation

## 2014-06-27 MED ORDER — HEPARIN SOD (PORK) LOCK FLUSH 100 UNIT/ML IV SOLN
500.0000 [IU] | Freq: Once | INTRAVENOUS | Status: AC
  Administered 2014-06-27: 500 [IU] via INTRAVENOUS

## 2014-06-27 MED ORDER — SODIUM CHLORIDE 0.9 % IJ SOLN
10.0000 mL | INTRAMUSCULAR | Status: AC | PRN
Start: 2014-06-27 — End: ?
  Administered 2014-06-27: 10 mL
  Filled 2014-06-27: qty 10

## 2014-06-27 MED ORDER — HEPARIN SOD (PORK) LOCK FLUSH 100 UNIT/ML IV SOLN
INTRAVENOUS | Status: AC
  Filled 2014-06-27: qty 5

## 2014-07-06 ENCOUNTER — Inpatient Hospital Stay: Payer: BLUE CROSS/BLUE SHIELD

## 2014-08-03 ENCOUNTER — Other Ambulatory Visit: Payer: Self-pay | Admitting: *Deleted

## 2014-08-03 MED ORDER — GABAPENTIN 100 MG PO CAPS: 100.0000 mg | ORAL_CAPSULE | Freq: Three times a day (TID) | ORAL | Status: DC

## 2014-08-03 MED ORDER — LORAZEPAM 0.5 MG PO TABS: 0.5000 mg | ORAL_TABLET | Freq: Three times a day (TID) | ORAL | Status: DC

## 2014-08-17 ENCOUNTER — Inpatient Hospital Stay: Payer: BLUE CROSS/BLUE SHIELD

## 2014-08-18 ENCOUNTER — Telehealth: Payer: Self-pay | Admitting: *Deleted

## 2014-08-18 MED ORDER — LORAZEPAM 0.5 MG PO TABS: 0.5000 mg | ORAL_TABLET | Freq: Three times a day (TID) | ORAL | Status: DC

## 2014-08-18 NOTE — Telephone Encounter (Signed)
rx faxed

## 2014-08-18 NOTE — Telephone Encounter (Signed)
Dr Ma Hillock patient

## 2014-08-22 ENCOUNTER — Encounter: Payer: Self-pay | Admitting: Radiation Oncology

## 2014-08-22 ENCOUNTER — Ambulatory Visit
Admission: RE | Admit: 2014-08-22 | Discharge: 2014-08-22 | Disposition: A | Discharge status: Home / Self Care | Payer: 59 | Source: Ambulatory Visit | Attending: Radiation Oncology | Admitting: Radiation Oncology

## 2014-08-22 ENCOUNTER — Telehealth: Payer: Self-pay | Admitting: *Deleted

## 2014-08-22 ENCOUNTER — Inpatient Hospital Stay: Payer: 59 | Attending: Internal Medicine

## 2014-08-22 VITALS — BP 134/85 | HR 119 | Temp 97.9°F | Resp 18 | Wt 128.3 lb

## 2014-08-22 DIAGNOSIS — C801 Malignant (primary) neoplasm, unspecified: Secondary | ICD-10-CM

## 2014-08-22 DIAGNOSIS — C3491 Malignant neoplasm of unspecified part of right bronchus or lung: Secondary | ICD-10-CM | POA: Diagnosis present

## 2014-08-22 DIAGNOSIS — Z452 Encounter for adjustment and management of vascular access device: Secondary | ICD-10-CM | POA: Diagnosis not present

## 2014-08-22 DIAGNOSIS — C3411 Malignant neoplasm of upper lobe, right bronchus or lung: Secondary | ICD-10-CM

## 2014-08-22 MED ORDER — SODIUM CHLORIDE 0.9 % IJ SOLN
10.0000 mL | INTRAMUSCULAR | Status: AC | PRN
  Administered 2014-08-22: 10 mL
  Filled 2014-08-22: qty 10

## 2014-08-22 MED ORDER — HEPARIN SOD (PORK) LOCK FLUSH 100 UNIT/ML IV SOLN
500.0000 [IU] | Freq: Once | INTRAVENOUS | Status: AC
  Administered 2014-08-22: 500 [IU]

## 2014-08-22 MED ORDER — HEPARIN SOD (PORK) LOCK FLUSH 100 UNIT/ML IV SOLN
INTRAVENOUS | Status: AC
  Filled 2014-08-22: qty 5

## 2014-08-22 NOTE — Telephone Encounter (Signed)
Opened in error

## 2014-08-22 NOTE — Progress Notes (Signed)
Radiation Oncology Follow up Note  Name: Darryl Hatfield   Date:   08/22/2014 MRN:  [id number] DOB: 1960/02/04    This 54 y.o. male presents to the clinic today for lung cancer follow-up stage IIIa/B (T1 N2 M0) squamous cell carcinoma of the right lung status post concurrent chemotherapy and radiation therapy.  REFERRING PROVIDER: No ref. provider found  HPI: patient is a 54 year old male now one year out having completed combined modality treatment with chemotherapy and radiation for a stage IIIa squamous cell carcinoma the right lung. He is seen today in routine follow-up is doing fairly well. Does have compression fracture of L3 and is scheduled for surgery on that at [facility]. They're following his cardiac status as his ejection fraction is low. He's had follow-up PET CT scans showing no evidence of disease either in the chest or metastatic disease. He did have one episode of seizure back in March none since..he specifically denies cough hemoptysis or chest tightness. He does wear a back brace.  COMPLICATIONS OF TREATMENT: none  FOLLOW UP COMPLIANCE: keeps appointments   PHYSICAL EXAM:  BP 134/85 mmHg  Pulse 119  Temp(Src) 97.9 F (36.6 C)  Resp 18  Wt 128 lb 4.9 oz (58.2 kg) Well-developed male in NAD. No cervical or supraclavicular adenopathies identified. Patient is wearing a hard back brace. Well-developed well-nourished patient in NAD. HEENT reveals PERLA, EOMI, discs not visualized.  Oral cavity is clear. No oral mucosal lesions are identified. Neck is clear without evidence of cervical or supraclavicular adenopathy. Lungs are clear to A&P. Cardiac examination is essentially unremarkable with regular rate and rhythm without murmur rub or thrill. Abdomen is benign with no organomegaly or masses noted. Motor sensory and DTR levels are equal and symmetric in the upper and lower extremities. Cranial nerves II through XII are grossly intact. Proprioception is intact. No peripheral  adenopathy or edema is identified. No motor or sensory levels are noted. Crude visual fields are within normal range.   RADIOLOGY RESULTS: most recent CT scan and PET CT scans are reviewed  PLAN: present time patient is doing well with no evidence of disease he is scheduled for another CT scan in September and possibly follow-up PET CT scan should be other any further change in his examination. I am please was overall progress. He is scheduled for back surgery at [facility]. I have asked to see him back in 6 months for follow-up. He knows to call anytime with any concerns.  I would like to take this opportunity for allowing me to participate in the care of your patient.Armstead Peaks., MD

## 2014-08-24 ENCOUNTER — Telehealth: Payer: Self-pay | Admitting: *Deleted

## 2014-08-24 NOTE — Telephone Encounter (Signed)
Paper script signed today morning and was faxed by [facility].

## 2014-08-31 ENCOUNTER — Telehealth: Payer: Self-pay | Admitting: *Deleted

## 2014-08-31 MED ORDER — BUPROPION HCL ER (SR) 200 MG PO TB12
200.0000 mg | ORAL_TABLET | Freq: Two times a day (BID) | ORAL | Status: DC
Start: 2014-08-31 — End: 2015-01-09

## 2014-08-31 NOTE — Telephone Encounter (Signed)
Can no longer use [facility] due to insurance adn needs to be sent to [facility]. E scribed

## 2014-09-12 ENCOUNTER — Other Ambulatory Visit: Payer: Self-pay | Admitting: *Deleted

## 2014-09-12 ENCOUNTER — Other Ambulatory Visit: Payer: Self-pay | Admitting: Internal Medicine

## 2014-09-12 DIAGNOSIS — M5430 Sciatica, unspecified side: Secondary | ICD-10-CM

## 2014-09-12 MED ORDER — LORAZEPAM 0.5 MG PO TABS: 0.5000 mg | ORAL_TABLET | Freq: Three times a day (TID) | ORAL | Status: DC

## 2014-09-12 MED ORDER — GABAPENTIN 100 MG PO CAPS: 100.0000 mg | ORAL_CAPSULE | Freq: Three times a day (TID) | ORAL | Status: DC

## 2014-09-12 NOTE — Telephone Encounter (Signed)
sent 

## 2014-09-28 ENCOUNTER — Inpatient Hospital Stay: Payer: BLUE CROSS/BLUE SHIELD

## 2014-09-28 ENCOUNTER — Inpatient Hospital Stay: Payer: 59 | Attending: Internal Medicine

## 2014-09-28 ENCOUNTER — Inpatient Hospital Stay: Payer: 59

## 2014-09-28 ENCOUNTER — Ambulatory Visit
Admission: RE | Admit: 2014-09-28 | Discharge: 2014-09-28 | Disposition: A | Discharge status: Home / Self Care | Payer: 59 | Source: Ambulatory Visit | Attending: Internal Medicine | Admitting: Internal Medicine

## 2014-09-28 DIAGNOSIS — I509 Heart failure, unspecified: Secondary | ICD-10-CM | POA: Diagnosis not present

## 2014-09-28 DIAGNOSIS — Z85118 Personal history of other malignant neoplasm of bronchus and lung: Secondary | ICD-10-CM | POA: Diagnosis not present

## 2014-09-28 DIAGNOSIS — Z87891 Personal history of nicotine dependence: Secondary | ICD-10-CM | POA: Insufficient documentation

## 2014-09-28 DIAGNOSIS — Z79899 Other long term (current) drug therapy: Secondary | ICD-10-CM | POA: Insufficient documentation

## 2014-09-28 DIAGNOSIS — C3491 Malignant neoplasm of unspecified part of right bronchus or lung: Secondary | ICD-10-CM | POA: Insufficient documentation

## 2014-09-28 DIAGNOSIS — Z08 Encounter for follow-up examination after completed treatment for malignant neoplasm: Secondary | ICD-10-CM | POA: Diagnosis present

## 2014-09-28 DIAGNOSIS — R911 Solitary pulmonary nodule: Secondary | ICD-10-CM | POA: Insufficient documentation

## 2014-09-28 DIAGNOSIS — J40 Bronchitis, not specified as acute or chronic: Secondary | ICD-10-CM | POA: Insufficient documentation

## 2014-09-28 DIAGNOSIS — J449 Chronic obstructive pulmonary disease, unspecified: Secondary | ICD-10-CM | POA: Diagnosis not present

## 2014-09-28 DIAGNOSIS — D72819 Decreased white blood cell count, unspecified: Secondary | ICD-10-CM | POA: Insufficient documentation

## 2014-09-28 DIAGNOSIS — C3411 Malignant neoplasm of upper lobe, right bronchus or lung: Secondary | ICD-10-CM

## 2014-09-28 LAB — CBC WITH DIFFERENTIAL/PLATELET
BASOS ABS: 0 10*3/uL (ref 0–0.1)
Basophils Relative: 1 %
Eosinophils Absolute: 0.4 10*3/uL (ref 0–0.7)
Eosinophils Relative: 7 %
HEMATOCRIT: 39.7 % — AB (ref 40.0–52.0)
HEMOGLOBIN: 13.3 g/dL (ref 13.0–18.0)
Lymphocytes Relative: 25 %
Lymphs Abs: 1.3 10*3/uL (ref 1.0–3.6)
MCH: 29.7 pg (ref 26.0–34.0)
MCHC: 33.5 g/dL (ref 32.0–36.0)
MCV: 88.9 fL (ref 80.0–100.0)
Monocytes Absolute: 0.7 10*3/uL (ref 0.2–1.0)
Monocytes Relative: 14 %
NEUTROS ABS: 2.8 10*3/uL (ref 1.4–6.5)
NEUTROS PCT: 53 %
Platelets: 240 10*3/uL (ref 150–440)
RBC: 4.47 MIL/uL (ref 4.40–5.90)
RDW: 14.6 % — ABNORMAL HIGH (ref 11.5–14.5)
WBC: 5.2 10*3/uL (ref 3.8–10.6)

## 2014-09-28 LAB — HEPATIC FUNCTION PANEL
ALBUMIN: 4.1 g/dL (ref 3.5–5.0)
ALT: 20 U/L (ref 17–63)
AST: 18 U/L (ref 15–41)
Alkaline Phosphatase: 73 U/L (ref 38–126)
BILIRUBIN TOTAL: 0.6 mg/dL (ref 0.3–1.2)
Bilirubin, Direct: <0.1 mg/dL — ABNORMAL LOW (ref 0.1–0.5)
TOTAL PROTEIN: 7 g/dL (ref 6.5–8.1)

## 2014-09-28 LAB — CREATININE, SERUM
Creatinine, Ser: 0.96 mg/dL (ref 0.61–1.24)
GFR calc Af Amer: >60 mL/min (ref >60)

## 2014-09-28 LAB — CALCIUM: Calcium: 9.1 mg/dL (ref 8.9–10.3)

## 2014-09-28 MED ORDER — IOHEXOL 300 MG/ML  SOLN
75.0000 mL | Freq: Once | INTRAMUSCULAR | Status: AC | PRN
  Administered 2014-09-28: 75 mL via INTRAVENOUS

## 2014-09-28 NOTE — Progress Notes (Unsigned)
Patient stated that he had port flushed at [facility] on 09/27/14.

## 2014-09-30 ENCOUNTER — Ambulatory Visit: Payer: BLUE CROSS/BLUE SHIELD | Admitting: Internal Medicine

## 2014-09-30 ENCOUNTER — Telehealth: Payer: Self-pay | Admitting: *Deleted

## 2014-09-30 ENCOUNTER — Inpatient Hospital Stay (HOSPITAL_BASED_OUTPATIENT_CLINIC_OR_DEPARTMENT_OTHER): Payer: 59 | Admitting: Internal Medicine

## 2014-09-30 VITALS — BP 143/90 | HR 77 | Temp 95.1°F | Resp 18 | Ht 68.11 in | Wt 129.4 lb

## 2014-09-30 DIAGNOSIS — D72819 Decreased white blood cell count, unspecified: Secondary | ICD-10-CM

## 2014-09-30 DIAGNOSIS — C3491 Malignant neoplasm of unspecified part of right bronchus or lung: Secondary | ICD-10-CM

## 2014-09-30 DIAGNOSIS — Z79899 Other long term (current) drug therapy: Secondary | ICD-10-CM

## 2014-09-30 DIAGNOSIS — J40 Bronchitis, not specified as acute or chronic: Secondary | ICD-10-CM

## 2014-09-30 MED ORDER — LEVOFLOXACIN 500 MG PO TABS: 500.0000 mg | ORAL_TABLET | Freq: Every day | ORAL | Status: DC

## 2014-09-30 NOTE — Telephone Encounter (Signed)
pandit gave verbal order for atb. It was sent in electronically ( [email or web address]).

## 2014-09-30 NOTE — Progress Notes (Signed)
Patient is here for follow-up of lung cancer and CT Scan results. Patient is scheduled to have back surgery on Sept. 30th at [facility]. Patient states that overall he has been feeling "ok".

## 2014-10-17 NOTE — Progress Notes (Signed)
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 Part B: RIGHT HILAR ADENOPATHY, EBUS ASSISTED BIOPSY: - METASTATIC NON-SMALL CELL CARCINOMA, FAVOR SQUAMOUS CELL CARCINOMA. Completed concurrent radiation with chemotherapy with Taxol/Carboplatin (cycle 4 day 15 chemo was on 08/02/13).    HISTORY OF PRESENT ILLNESS:  Patient returns for continued oncology follow-up, he is status post treatment for lung cancer as described above. He was last seen in May. States that he is doing steady except as noticed increase in cough with yellowish sputum and some increase in chronic dyspnea on exertion. No pleuritic type chest pain or hemoptysis. No fever or chills. He had seizure in March and sustained fracture ribs. Appetite is steady. He has chronic fatigue and mild dyspnea on exertion which is at baseline. He has chronic hip pain and rib pains, no new bone lesions. No fevers.   REVIEW OF SYSTEMS:   ROS As in HPI above. In addition, no fever, chills. No new headaches or focal weakness.  No sore throat or dysphagia. No dizziness or palpitation. No abdominal pain, constipation, diarrhea, dysuria or hematuria. No new skin rash or bleeding symptoms. No new paresthesias in extremities. PS ECOG 2.  PAST MEDICAL HISTORY: Past Medical History  Diagnosis Date  . COPD (chronic obstructive pulmonary disease)   . Lung cancer 2015    RIght   . 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          Seizure, rib fractures March 2016.    FAMILY HISTORY: Remarkable for heart disease. Father had unknown type of malignancy.     SOCIAL HISTORY: Chronic smoker, 1 pack a day for 40 years, quit June 2015. Works in the [employer]. Denies                                              alcohol or recreational drug usage.    Allergies  Allergen Reactions  . No Known Allergies     Current Outpatient Prescriptions  Medication Sig Dispense Refill  . acetaminophen (TYLENOL) 325 MG tablet Take by mouth.    Marland Kitchen albuterol (PROVENTIL) (2.5 MG/3ML) 0.083% nebulizer solution Take 2.5 mg by nebulization every 6 (six) hours as needed for wheezing or shortness of breath.    Marland Kitchen buPROPion (WELLBUTRIN SR) 200 MG 12 hr tablet Take 1 tablet (200 mg total) by mouth 2 (two) times daily. 60 tablet 2  . cyclobenzaprine (FLEXERIL) 10 MG tablet Take 10 mg by mouth 3 (three) times daily as needed for muscle spasms.    . Fluticasone-Salmeterol (ADVAIR) 250-50 MCG/DOSE AEPB Inhale 1 puff into the lungs 2 (two) times daily.    Marland Kitchen gabapentin (NEURONTIN) 100 MG capsule Take 1 capsule (100 mg total) by mouth 3 (three) times daily. 90 capsule 4  .  HYDROcodone-acetaminophen (NORCO/VICODIN) 5-325 MG per tablet TAKE 1 TABLET BY MOUTH EVERY 8 HRS AS NEEDED FOR PAIN  0  . ipratropium-albuterol (DUONEB) 0.5-2.5 (3) MG/3ML SOLN Take 4 mLs by nebulization every 6 (six) hours as needed.    Marland Kitchen levofloxacin (LEVAQUIN) 500 MG tablet Take 1 tablet (500 mg total) by mouth daily. 6 tablet 0  . LORazepam (ATIVAN) 0.5 MG tablet Take 1 tablet (0.5 mg total) by mouth 3 (three) times daily. 90 tablet 0  . Melatonin 3 MG TABS Take by mouth.    . Multiple Vitamin (MULTI-VITAMINS) TABS Take by mouth.    . Naproxen Sod-Diphenhydramine (ALEVE PM) 220-25 MG TABS Take by mouth.    . Oxycodone HCl 10 MG TABS 1-2 p.o. nightly p.r.n. pain     . tiotropium (SPIRIVA) 18 MCG inhalation capsule Place 18 mcg into inhaler and inhale daily.     No current facility-administered medications for this visit.   Facility-Administered Medications Ordered in Other Visits  Medication Dose Route Frequency Provider Last Rate Last Dose  . sodium chloride 0.9 % injection 10 mL  10 mL Intracatheter PRN Forest Gleason, MD   10 mL at 06/27/14 1338  . sodium chloride 0.9 % injection 10 mL  10 mL Intracatheter PRN Leia Alf, MD   10 mL at 08/22/14 0851    OBJECTIVE: Filed Vitals:   09/30/14 1135  BP: 143/90  Pulse: 77  Temp: 95.1 F (35.1 C)  Resp: 18     Body mass index is 19.61 kg/(m^2).    ECOG FS:2 - Symptomatic, <50% confined to bed  GENERAL: Alert and oriented and in no acute distress. No icterus. HEENT: EOMs intact.  No cervical lymphadenopathy. CVS: S1S2, regular LUNGS: Decreased breath sounds right upper lobe area, chronic finding. Otherwise bilateral good air entry, no rhonchi. ABDOMEN: Soft, nontender. No hepatomegaly clinically.  NEURO: grossly nonfocal, cranial nerves are intact.  EXTREMITIES: No pedal edema.   Lab Results  Component Value Date   WBC 5.2 09/28/2014   NEUTROABS 2.8 09/28/2014   HGB 13.3 09/28/2014   HCT 39.7* 09/28/2014   MCV 88.9 09/28/2014   PLT 240 09/28/2014      Chemistry      Component Value Date/Time   NA 142 01/17/2014 1028   NA 140 03/02/2009 1729   K 4.0 01/17/2014 1028   K 3.6 03/02/2009 1729   CL 102 01/17/2014 1028   CL 104 03/02/2009 1729   CO2 30 01/17/2014 1028   BUN 13 01/17/2014 1028   BUN 12 03/02/2009 1729   CREATININE 0.96 09/28/2014 0927   CREATININE 0.91 02/03/2014 0842      Component Value Date/Time   CALCIUM 9.1 09/28/2014 0927   CALCIUM 9.0 01/17/2014 1028   ALKPHOS 73 09/28/2014 0927   ALKPHOS 74 02/03/2014 0842   AST 18 09/28/2014 0927   AST 17 02/03/2014 0842   ALT 20 09/28/2014 0927   ALT 26 02/03/2014 0842   BILITOT 0.6 09/28/2014 0927   BILITOT 0.3  02/03/2014 0842      STUDIES:   05/24/14 - CT Scan of Chest. FINDINGS: Mediastinum/Nodes: There are no enlarged mediastinal, hilar or axillary lymph nodes.Right paratracheal density on image 24 is stable, attributed to a combination of unfilled azygos vein and post radiation scarring. The thyroid gland, trachea and esophagus demonstrate no significant findings. The heart size is normal. There s no pericardial effusion.Right IJ Port-A-Cath tip extends to the upper right atrium. Mild atherosclerosis appears unchanged. Lungs/Pleura: There is no pleural  effusion. There is progressive volume loss and post radiation scarring centrally in the right suprahilar region. Previously demonstrated spiculated right upper lobe nodule is partly obscured by adjacent radiation change but stable (image 25). There is stable linear scarring in the left upper lobe, measuring approximately 13 x 4 mm on image 22. Left lower lobe nodule measuring 4 mm on image 45 is slightly larger. No other pulmonary nodules identified. Upper abdomen: Unremarkable. There is no adrenal mass. Musculoskeletal/Chest wall: No chest wall mass or osseous metastases demonstrated. There is a severe burst fracture at L3 as demonstrated on the recent examination. There is associated osseous retropulsion (approximately 10 mm) with progressive loss of vertebral body height.  IMPRESSION:  1. Right upper lobe radiation changes with partial obscuration of underlying spiculated right upper lobe lesion. 2. No evidence of local recurrence or progressive metastatic disease. A small left lower lobe nodule appears slightly larger. 3. Severe burst fracture at L3 as demonstrated on examination of 6 weeks ago. There is progressive loss of vertebral body height with significant osseous retropulsion.  09/28/14 - CT scan of the chest.  IMPRESSION:  1. Similar appearance of right upper lobe radiation change. No locally recurrent or residual disease.  2. An AP window node is  unchanged at 11 mm. This has been similar over prior exams, suggesting a benign etiology.    ASSESSMENT / PLAN:   1. 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 Part B: RIGHT HILAR ADENOPATHY, EBUS ASSISTED BIOPSY: - METASTATIC NON-SMALL CELL CARCINOMA, FAVOR SQUAMOUS CELL CARCINOMA -  reviewed CT chest and discussed with patient/wife present. CT reports chronic right upper lobe changes, small AP window lymph node unchanged at 11 mm with no new changes to suggest progressive malignancy. Plan is continued surveillance. Will see him back at 18 weeks with CBC, Cr, LFT and plan further management. 2. Bronchitis  -  otherwise no wheezing. Will treat with empiric Levaquin 500 mg daily 6. Patient advised to call here or primary physician if symptoms do not improve.    3. Portacath - continue port flush q6 weeks. 4. Leucopenia - has improved. Continue to monitor. 5. In between visits, patient advised to call or come to ER in case of any worsening or new symptoms, or acute sickness. He is agreeable to this plan.   Leia Alf, MD   10/17/2014 1:58 PM

## 2014-10-19 ENCOUNTER — Other Ambulatory Visit: Payer: Self-pay | Admitting: Internal Medicine

## 2014-10-19 MED ORDER — LORAZEPAM 0.5 MG PO TABS: 0.5000 mg | ORAL_TABLET | Freq: Three times a day (TID) | ORAL | Status: DC | PRN

## 2014-10-26 ENCOUNTER — Telehealth: Payer: Self-pay | Admitting: *Deleted

## 2014-10-26 NOTE — Telephone Encounter (Signed)
askin gfor lorazepam rx refill, I told her #[phone] were sent in on 9/30 and she said she will call pharmacy that she had just not picked it up yet

## 2014-11-03 ENCOUNTER — Encounter: Payer: Self-pay | Admitting: Emergency Medicine

## 2014-11-03 ENCOUNTER — Emergency Department
Admission: EM | Admit: 2014-11-03 | Discharge: 2014-11-03 | Disposition: A | Discharge status: Other Acute Inpatient Hospital | Payer: 59 | Attending: Emergency Medicine | Admitting: Emergency Medicine

## 2014-11-03 DIAGNOSIS — T8140XA Infection following a procedure, unspecified, initial encounter: Secondary | ICD-10-CM

## 2014-11-03 DIAGNOSIS — Z87891 Personal history of nicotine dependence: Secondary | ICD-10-CM | POA: Diagnosis not present

## 2014-11-03 DIAGNOSIS — Z79899 Other long term (current) drug therapy: Secondary | ICD-10-CM | POA: Diagnosis not present

## 2014-11-03 DIAGNOSIS — Z7951 Long term (current) use of inhaled steroids: Secondary | ICD-10-CM | POA: Insufficient documentation

## 2014-11-03 DIAGNOSIS — T819XXA Unspecified complication of procedure, initial encounter: Secondary | ICD-10-CM | POA: Diagnosis present

## 2014-11-03 DIAGNOSIS — T814XXA Infection following a procedure, initial encounter: Secondary | ICD-10-CM | POA: Insufficient documentation

## 2014-11-03 DIAGNOSIS — Z792 Long term (current) use of antibiotics: Secondary | ICD-10-CM | POA: Diagnosis not present

## 2014-11-03 DIAGNOSIS — Y828 Other medical devices associated with adverse incidents: Secondary | ICD-10-CM | POA: Insufficient documentation

## 2014-11-03 DIAGNOSIS — Z79891 Long term (current) use of opiate analgesic: Secondary | ICD-10-CM | POA: Insufficient documentation

## 2014-11-03 LAB — CBC
HEMATOCRIT: 33.2 % — AB (ref 40.0–52.0)
Hemoglobin: 11 g/dL — ABNORMAL LOW (ref 13.0–18.0)
MCH: 29.8 pg (ref 26.0–34.0)
MCHC: 33.1 g/dL (ref 32.0–36.0)
MCV: 90.2 fL (ref 80.0–100.0)
Platelets: 489 10*3/uL — ABNORMAL HIGH (ref 150–440)
RBC: 3.68 MIL/uL — AB (ref 4.40–5.90)
RDW: 13.8 % (ref 11.5–14.5)
WBC: 10.5 10*3/uL (ref 3.8–10.6)

## 2014-11-03 LAB — BASIC METABOLIC PANEL
ANION GAP: 7 (ref 5–15)
BUN: 22 mg/dL — AB (ref 6–20)
CHLORIDE: 97 mmol/L — AB (ref 101–111)
CO2: 30 mmol/L (ref 22–32)
Calcium: 9.5 mg/dL (ref 8.9–10.3)
Creatinine, Ser: 0.93 mg/dL (ref 0.61–1.24)
GFR calc Af Amer: >60 mL/min (ref >60)
GFR calc non Af Amer: >60 mL/min (ref >60)
GLUCOSE: 124 mg/dL — AB (ref 65–99)
POTASSIUM: 3.9 mmol/L (ref 3.5–5.1)
Sodium: 134 mmol/L — ABNORMAL LOW (ref 135–145)

## 2014-11-03 MED ORDER — ONDANSETRON 4 MG PO TBDP
ORAL_TABLET | ORAL | Status: AC
  Administered 2014-11-03: 4 mg via ORAL
  Filled 2014-11-03: qty 1

## 2014-11-03 MED ORDER — MORPHINE SULFATE (PF) 4 MG/ML IV SOLN
INTRAVENOUS | Status: AC
  Administered 2014-11-03: 4 mg via INTRAMUSCULAR
  Filled 2014-11-03: qty 1

## 2014-11-03 MED ORDER — MORPHINE SULFATE (PF) 4 MG/ML IV SOLN
4.0000 mg | Freq: Once | INTRAVENOUS | Status: AC
  Administered 2014-11-03: 4 mg via INTRAMUSCULAR

## 2014-11-03 MED ORDER — ONDANSETRON 4 MG PO TBDP
4.0000 mg | ORAL_TABLET | Freq: Once | ORAL | Status: AC
  Administered 2014-11-03: 4 mg via ORAL

## 2014-11-03 MED ORDER — MORPHINE SULFATE (PF) 4 MG/ML IV SOLN
4.0000 mg | Freq: Once | INTRAVENOUS | Status: AC
  Administered 2014-11-03: 4 mg via INTRAVENOUS
  Filled 2014-11-03: qty 1

## 2014-11-03 NOTE — ED Provider Notes (Addendum)
[facility] Emergency Department Provider Note  ____________________________________________  Time seen: Approximately 5:41 AM  I have reviewed the triage vital signs and the nursing notes.   HISTORY  Chief Complaint Post-op Problem    HPI Darryl Hatfield is a 54 y.o. male who presents to the ED from home with a chief complain of wound infection. Patient had back surgery by Dr. Macario Carls at [facility] on September 30. Presents to the ED tonight for subjective fever and chills at home as well as purulent discharge from one of his incision sites. Wife changes his dressings and states she was concerned when she noted purulence on tonight's dressing. Patient denies increased pain. Denies chest pain, cough, shortness of breath, abdominal pain, nausea, vomiting, diarrhea. Denies recent travel or trauma.Denies numbness/tingling in legs. Concern for pain in left groin.  Procedures performed on 9/30:  CORPECTOMY THORACIC/LUMBAR/SACRAL TRANSPERITONEAL/RETROPERITONEAL L3 (N/A) APPLICATION VERTERBRAL DEFECT PROSTHETIC DEVICE (N/A) POSTERIOR LUMBAR SPINE FUSION ONE LEVEL L2- L3 (N/A) POSTERIOR THORACIC/LUMBAR SPINE FUSION/ARTHRODESIS ADDITIONAL LEVELS L3-L4 (N/A) INSTRUMENTATION POSTERIOR SPINE 3 TO 6 VERTEBRAL SEGMENTS (N/A) AUTOGRAFT OBTAINED SAME INCISION FOR SPINE SURGERY (N/A) INSERTION MORSELIZED BONE ALLOGRAFT FOR SPINE SURGERY (N/A).    Past Medical History  Diagnosis Date  . COPD (chronic obstructive pulmonary disease) ([place])   . Lung cancer ([place]) 2015    RIght   . CHF (congestive heart failure) [facility])     Patient Active Problem List   Diagnosis Date Noted  . SCIATICA 03/01/2010    Past Surgical History  Procedure Laterality Date  . Back surgery      Current Outpatient Rx  Name  Route  Sig  Dispense  Refill  . acetaminophen (TYLENOL) 325 MG tablet   Oral   Take by mouth.         Marland Kitchen albuterol (PROVENTIL) (2.5 MG/3ML) 0.083% nebulizer  solution   Nebulization   Take 2.5 mg by nebulization every 6 (six) hours as needed for wheezing or shortness of breath.         Marland Kitchen buPROPion (WELLBUTRIN SR) 200 MG 12 hr tablet   Oral   Take 1 tablet (200 mg total) by mouth 2 (two) times daily.   60 tablet   2   . cyclobenzaprine (FLEXERIL) 10 MG tablet   Oral   Take 10 mg by mouth 3 (three) times daily as needed for muscle spasms.         . Fluticasone-Salmeterol (ADVAIR) 250-50 MCG/DOSE AEPB   Inhalation   Inhale 1 puff into the lungs 2 (two) times daily.         Marland Kitchen gabapentin (NEURONTIN) 100 MG capsule   Oral   Take 1 capsule (100 mg total) by mouth 3 (three) times daily.   90 capsule   4   . HYDROcodone-acetaminophen (NORCO/VICODIN) 5-325 MG per tablet      TAKE 1 TABLET BY MOUTH EVERY 8 HRS AS NEEDED FOR PAIN      0   . ipratropium-albuterol (DUONEB) 0.5-2.5 (3) MG/3ML SOLN   Nebulization   Take 4 mLs by nebulization every 6 (six) hours as needed.         Marland Kitchen levofloxacin (LEVAQUIN) 500 MG tablet   Oral   Take 1 tablet (500 mg total) by mouth daily.   6 tablet   0   . LORazepam (ATIVAN) 0.5 MG tablet   Oral   Take 1 tablet (0.5 mg total) by mouth every 8 (eight) hours as needed for anxiety.  90 tablet   0   . Melatonin 3 MG TABS   Oral   Take by mouth.         . Multiple Vitamin (MULTI-VITAMINS) TABS   Oral   Take by mouth.         . Naproxen Sod-Diphenhydramine (ALEVE PM) 220-25 MG TABS   Oral   Take by mouth.         . Oxycodone HCl 10 MG TABS      1-2 p.o. nightly p.r.n. pain         . tiotropium (SPIRIVA) 18 MCG inhalation capsule   Inhalation   Place 18 mcg into inhaler and inhale daily.           Allergies No known allergies  No family history on file.  Social History Social History  Substance Use Topics  . Smoking status: Former Smoker -- 1.00 packs/day for 40 years  . Smokeless tobacco: Never Used  . Alcohol Use: No    Review of Systems Constitutional:  Positive for subjective fever/chills Eyes: No visual changes. ENT: No sore throat. Cardiovascular: Denies chest pain. Respiratory: Denies shortness of breath. Gastrointestinal: No abdominal pain.  No nausea, no vomiting.  No diarrhea.  No constipation. Genitourinary: Negative for dysuria. Musculoskeletal: Positive for back pain. Skin: Positive for purulence from wound. Negative for rash. Neurological: Negative for headaches, focal weakness or numbness.  10-point ROS otherwise negative.  ____________________________________________   PHYSICAL EXAM:  VITAL SIGNS: ED Triage Vitals  Enc Vitals Group     BP 11/03/14 0249 112/73 mmHg     Pulse Rate 11/03/14 0249 116     Resp 11/03/14 0249 18     Temp 11/03/14 0249 97.9 F (36.6 C)     Temp Source 11/03/14 0249 Oral     SpO2 11/03/14 0249 97 %     Weight 11/03/14 0249 126 lb (57.153 kg)     Height 11/03/14 0249 '5\' 8"'$  (1.727 m)     Head Cir --      Peak Flow --      Pain Score 11/03/14 0249 6     Pain Loc --      Pain Edu? --      Excl. in [place]? --     Constitutional: Alert and oriented. Well appearing and in mild acute distress. Eyes: Conjunctivae are normal. PERRL. EOMI. Head: Atraumatic. Nose: No congestion/rhinnorhea. Mouth/Throat: Mucous membranes are moist.  Oropharynx non-erythematous. Neck: No stridor.   Cardiovascular: Normal rate, regular rhythm. Grossly normal heart sounds.  Good peripheral circulation. Respiratory: Normal respiratory effort.  No retractions. Lungs CTAB. Gastrointestinal: Soft and nontender. No distention. No abdominal bruits. No CVA tenderness. Genitourinary: Soft, easy reducible palpable mass at left groin most consistent with inguinal hernia. Musculoskeletal: Multiple healing incision sites to back with staples which are intact. There is a small, quarter-sized area of induration, warmth, erythema surrounding a staple on the lower right aspect of patient's back. Small amount of purulence expressed.  No lower extremity tenderness nor edema.  No joint effusions. Neurologic:  Normal speech and language. No gross focal neurologic deficits are appreciated.  Skin:  Skin is warm, dry and intact. No rash noted. Psychiatric: Mood and affect are normal. Speech and behavior are normal.  ____________________________________________   LABS (all labs ordered are listed, but only abnormal results are displayed)  Labs Reviewed  CBC - Abnormal; Notable for the following:    RBC 3.68 (*)    Hemoglobin 11.0 (*)  HCT 33.2 (*)    Platelets 489 (*)    All other components within normal limits  BASIC METABOLIC PANEL - Abnormal; Notable for the following:    Sodium 134 (*)    Chloride 97 (*)    Glucose, Bld 124 (*)    BUN 22 (*)    All other components within normal limits   ____________________________________________  EKG  None ____________________________________________  RADIOLOGY  None ____________________________________________   PROCEDURES  Procedure(s) performed: None  Critical Care performed: No  ____________________________________________   INITIAL IMPRESSION / ASSESSMENT AND PLAN / ED COURSE  Pertinent labs & imaging results that were available during my care of the patient were reviewed by me and considered in my medical decision making (see chart for details).  54 year old male almost 2 weeks s/p back surgery who presents with purulence from incision site. WBC noted within normal limits. Call placed to [facility] transfer center to consult orthopedics on call for further management/evaluation.  ----------------------------------------- 6:32 AM on 11/03/2014 -----------------------------------------  Patient was accepted by Dr. Marcos Eke for transfer to the [facility] ED for further evaluation. Patient and spouse updated and agreeable with plan. ____________________________________________   FINAL CLINICAL IMPRESSION(S) / ED DIAGNOSES  Final diagnoses:  Postoperative  infection, initial encounter      Paulette Blanch, MD 11/03/14 8118  Paulette Blanch, MD 11/03/14 [phone]

## 2014-11-03 NOTE — ED Notes (Signed)
Pt to triage via w/c with no distress noted; reports back surgery 2wks ago; has noted drainage from incision and told to come here for check

## 2014-11-07 ENCOUNTER — Telehealth: Payer: Self-pay | Admitting: *Deleted

## 2014-11-07 NOTE — Telephone Encounter (Signed)
Port was used at [facility] last week and asking if he needs to keep port flush appt tomorrow. I told him no and he asked that I cancel his appt for tomorrow

## 2014-11-11 ENCOUNTER — Inpatient Hospital Stay: Payer: BLUE CROSS/BLUE SHIELD

## 2014-11-30 ENCOUNTER — Telehealth: Payer: Self-pay | Admitting: *Deleted

## 2014-11-30 MED ORDER — LORAZEPAM 0.5 MG PO TABS: 0.5000 mg | ORAL_TABLET | Freq: Three times a day (TID) | ORAL | Status: DC | PRN

## 2014-11-30 NOTE — Telephone Encounter (Signed)
Called in refill per PO L Herring, AGNP-C. Pt notified

## 2014-12-23 ENCOUNTER — Inpatient Hospital Stay: Payer: 59 | Attending: Family Medicine

## 2014-12-23 DIAGNOSIS — Z452 Encounter for adjustment and management of vascular access device: Secondary | ICD-10-CM | POA: Insufficient documentation

## 2014-12-23 DIAGNOSIS — C3491 Malignant neoplasm of unspecified part of right bronchus or lung: Secondary | ICD-10-CM | POA: Insufficient documentation

## 2014-12-24 ENCOUNTER — Emergency Department: Payer: 59

## 2014-12-24 ENCOUNTER — Encounter: Payer: Self-pay | Admitting: Emergency Medicine

## 2014-12-24 ENCOUNTER — Observation Stay
Admission: EM | Admit: 2014-12-24 | Discharge: 2014-12-26 | Disposition: A | Discharge status: Home / Self Care | Payer: 59 | Attending: Internal Medicine | Admitting: Internal Medicine

## 2014-12-24 DIAGNOSIS — F329 Major depressive disorder, single episode, unspecified: Secondary | ICD-10-CM | POA: Insufficient documentation

## 2014-12-24 DIAGNOSIS — F03918 Unspecified dementia, unspecified severity, with other behavioral disturbance: Secondary | ICD-10-CM

## 2014-12-24 DIAGNOSIS — I509 Heart failure, unspecified: Secondary | ICD-10-CM | POA: Insufficient documentation

## 2014-12-24 DIAGNOSIS — Z79899 Other long term (current) drug therapy: Secondary | ICD-10-CM | POA: Insufficient documentation

## 2014-12-24 DIAGNOSIS — Z9221 Personal history of antineoplastic chemotherapy: Secondary | ICD-10-CM | POA: Insufficient documentation

## 2014-12-24 DIAGNOSIS — Z87891 Personal history of nicotine dependence: Secondary | ICD-10-CM | POA: Insufficient documentation

## 2014-12-24 DIAGNOSIS — Z923 Personal history of irradiation: Secondary | ICD-10-CM | POA: Insufficient documentation

## 2014-12-24 DIAGNOSIS — F0391 Unspecified dementia with behavioral disturbance: Secondary | ICD-10-CM

## 2014-12-24 DIAGNOSIS — R441 Visual hallucinations: Principal | ICD-10-CM | POA: Insufficient documentation

## 2014-12-24 DIAGNOSIS — Z79891 Long term (current) use of opiate analgesic: Secondary | ICD-10-CM | POA: Insufficient documentation

## 2014-12-24 DIAGNOSIS — R410 Disorientation, unspecified: Secondary | ICD-10-CM | POA: Diagnosis present

## 2014-12-24 DIAGNOSIS — G40909 Epilepsy, unspecified, not intractable, without status epilepticus: Secondary | ICD-10-CM | POA: Insufficient documentation

## 2014-12-24 DIAGNOSIS — F22 Delusional disorders: Secondary | ICD-10-CM | POA: Insufficient documentation

## 2014-12-24 DIAGNOSIS — M5431 Sciatica, right side: Secondary | ICD-10-CM | POA: Insufficient documentation

## 2014-12-24 DIAGNOSIS — Z8249 Family history of ischemic heart disease and other diseases of the circulatory system: Secondary | ICD-10-CM | POA: Insufficient documentation

## 2014-12-24 DIAGNOSIS — F419 Anxiety disorder, unspecified: Secondary | ICD-10-CM | POA: Insufficient documentation

## 2014-12-24 DIAGNOSIS — E860 Dehydration: Secondary | ICD-10-CM | POA: Insufficient documentation

## 2014-12-24 DIAGNOSIS — Z7951 Long term (current) use of inhaled steroids: Secondary | ICD-10-CM | POA: Insufficient documentation

## 2014-12-24 DIAGNOSIS — J449 Chronic obstructive pulmonary disease, unspecified: Secondary | ICD-10-CM | POA: Insufficient documentation

## 2014-12-24 DIAGNOSIS — Z85118 Personal history of other malignant neoplasm of bronchus and lung: Secondary | ICD-10-CM | POA: Insufficient documentation

## 2014-12-24 LAB — CBC WITH DIFFERENTIAL/PLATELET
Basophils Absolute: 0.1 10*3/uL (ref 0–0.1)
Basophils Relative: 1 %
EOS ABS: 0.1 10*3/uL (ref 0–0.7)
EOS PCT: 2 %
HCT: 41.7 % (ref 40.0–52.0)
HEMOGLOBIN: 13.4 g/dL (ref 13.0–18.0)
LYMPHS PCT: 13 %
Lymphs Abs: 1.1 10*3/uL (ref 1.0–3.6)
MCH: 26.5 pg (ref 26.0–34.0)
MCHC: 32 g/dL (ref 32.0–36.0)
MCV: 82.8 fL (ref 80.0–100.0)
MONOS PCT: 9 %
Monocytes Absolute: 0.7 10*3/uL (ref 0.2–1.0)
NEUTROS PCT: 75 %
Neutro Abs: 6.5 10*3/uL (ref 1.4–6.5)
Platelets: 197 10*3/uL (ref 150–440)
RBC: 5.04 MIL/uL (ref 4.40–5.90)
RDW: 14.9 % — ABNORMAL HIGH (ref 11.5–14.5)
WBC: 8.6 10*3/uL (ref 3.8–10.6)

## 2014-12-24 LAB — COMPREHENSIVE METABOLIC PANEL
ALK PHOS: 80 U/L (ref 38–126)
ALT: 12 U/L — AB (ref 17–63)
ANION GAP: 7 (ref 5–15)
AST: 15 U/L (ref 15–41)
Albumin: 4.3 g/dL (ref 3.5–5.0)
BUN: 22 mg/dL — ABNORMAL HIGH (ref 6–20)
CALCIUM: 9.6 mg/dL (ref 8.9–10.3)
CO2: 28 mmol/L (ref 22–32)
Chloride: 106 mmol/L (ref 101–111)
Creatinine, Ser: 1.19 mg/dL (ref 0.61–1.24)
GFR calc non Af Amer: >60 mL/min (ref >60)
Glucose, Bld: 123 mg/dL — ABNORMAL HIGH (ref 65–99)
Potassium: 3.9 mmol/L (ref 3.5–5.1)
SODIUM: 141 mmol/L (ref 135–145)
TOTAL PROTEIN: 7.6 g/dL (ref 6.5–8.1)

## 2014-12-24 LAB — URINALYSIS COMPLETE WITH MICROSCOPIC (ARMC ONLY)
Bacteria, UA: NONE SEEN
Bilirubin Urine: NEGATIVE
GLUCOSE, UA: NEGATIVE mg/dL
Hgb urine dipstick: NEGATIVE
KETONES UR: NEGATIVE mg/dL
Leukocytes, UA: NEGATIVE
Nitrite: NEGATIVE
Protein, ur: NEGATIVE mg/dL
SQUAMOUS EPITHELIAL / LPF: NONE SEEN
Specific Gravity, Urine: 1.013 (ref 1.005–1.030)
pH: 6 (ref 5.0–8.0)

## 2014-12-24 LAB — URINE DRUG SCREEN, QUALITATIVE (ARMC ONLY)
AMPHETAMINES, UR SCREEN: NOT DETECTED
Barbiturates, Ur Screen: NOT DETECTED
Benzodiazepine, Ur Scrn: NOT DETECTED
COCAINE METABOLITE, UR ~~LOC~~: NOT DETECTED
Cannabinoid 50 Ng, Ur ~~LOC~~: NOT DETECTED
MDMA (ECSTASY) UR SCREEN: NOT DETECTED
METHADONE SCREEN, URINE: NOT DETECTED
Opiate, Ur Screen: NOT DETECTED
Phencyclidine (PCP) Ur S: NOT DETECTED
TRICYCLIC, UR SCREEN: NOT DETECTED

## 2014-12-24 LAB — ETHANOL: Alcohol, Ethyl (B): <5 mg/dL (ref <5)

## 2014-12-24 MED ORDER — ACETAMINOPHEN 325 MG PO TABS
650.0000 mg | ORAL_TABLET | Freq: Once | ORAL | Status: AC
  Administered 2014-12-24: 650 mg via ORAL

## 2014-12-24 NOTE — ED Notes (Signed)
telepysch complete ATT

## 2014-12-24 NOTE — ED Provider Notes (Signed)
[facility] Emergency Department Provider Note  ____________________________________________  Time seen: 2025  I have reviewed the triage vital signs and the nursing notes.  History by:  Patient and his spouse  HISTORY  Chief Complaint Hallucinations     HPI Darryl Hatfield is a 54 y.o. male who reports that people had been trying to fool him and scare him. This initially was by reflecting lites in his bedroom. He reports that these people came in through the window to do this. There are multiple people. They also set up a nap like structure above his bed. This was made out of chicken wire-like material and shaped like a ball but then slowly diploid to be a net over the bed. He reports over 6 people in the room and they had lifted up the very heavy bed that he and his wife have. He has also seen a [employer] that was suspended from an outside tree on a wire. He has seen people in the house and when he has pointed in the direction of the people, his wife will turn but is unable to see those people.  The patient is adamant that these are real situations. When I discuss the possibility that his mind may be playing tricks on him, he becomes somewhat tangential and the story becomes even more convoluted.  The patient does have a history of lung cancer for which she was previously treated and is in remission.  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 He was on Wellbutrin when this occurred and he continues on that medication currently.  He denies any chest pain, shortness of breath, headache, or other physical, organic illness.   Past Medical History  Diagnosis Date  . COPD (chronic obstructive pulmonary disease) ([place])   . Lung cancer ([place]) 2015    RIght   . CHF (congestive heart failure) [facility])     Patient Active Problem List   Diagnosis Date Noted  . SCIATICA  03/01/2010    Past Surgical History  Procedure Laterality Date  . Back surgery      Current Outpatient Rx  Name  Route  Sig  Dispense  Refill  . acetaminophen (TYLENOL) 325 MG tablet   Oral   Take by mouth.         Marland Kitchen albuterol (PROVENTIL) (2.5 MG/3ML) 0.083% nebulizer solution   Nebulization   Take 2.5 mg by nebulization every 6 (six) hours as needed for wheezing or shortness of breath.         Marland Kitchen buPROPion (WELLBUTRIN SR) 200 MG 12 hr tablet   Oral   Take 1 tablet (200 mg total) by mouth 2 (two) times daily.   60 tablet   2   . Fluticasone-Salmeterol (ADVAIR) 250-50 MCG/DOSE AEPB   Inhalation   Inhale 1 puff into the lungs 2 (two) times daily.         Marland Kitchen gabapentin (NEURONTIN) 100 MG capsule   Oral   Take 1 capsule (100 mg total) by mouth 3 (three) times daily.   90 capsule   4   . levofloxacin (LEVAQUIN) 500 MG tablet   Oral   Take 1 tablet (500 mg total) by mouth daily. Patient not taking: Reported on 11/03/2014   6 tablet   0   . LORazepam (ATIVAN) 0.5 MG tablet   Oral   Take 1 tablet (0.5 mg total)  by mouth every 8 (eight) hours as needed for anxiety.   90 tablet   0   . Multiple Vitamin (MULTI-VITAMINS) TABS   Oral   Take by mouth.         . oxyCODONE (OXY IR/ROXICODONE) 5 MG immediate release tablet   Oral   Take 1-2 tablets by mouth every 4 (four) hours as needed for moderate pain, severe pain or breakthrough pain.          Marland Kitchen tiotropium (SPIRIVA) 18 MCG inhalation capsule   Inhalation   Place 18 mcg into inhaler and inhale daily.           Allergies No known allergies  No family history on file.  Social History Social History  Substance Use Topics  . Smoking status: Former Smoker -- 1.00 packs/day for 40 years  . Smokeless tobacco: Never Used  . Alcohol Use: No    Review of Systems  Constitutional: Negative for fever/chills. ENT: Negative for congestion. Cardiovascular: Negative for chest pain. Respiratory: Negative for  cough. Gastrointestinal: Negative for abdominal pain, vomiting and diarrhea. Genitourinary: Negative for dysuria. Musculoskeletal: No back pain. Skin: Negative for rash. Neurological: Negative for headache or focal weakness Psychiatric: Patient here with visual hallucinations. See history of present illness  10-point ROS otherwise negative.  ____________________________________________   PHYSICAL EXAM:  VITAL SIGNS: ED Triage Vitals  Enc Vitals Group     BP 12/24/14 1902 144/76 mmHg     Pulse Rate 12/24/14 1902 85     Resp 12/24/14 1902 20     Temp 12/24/14 1902 98 F (36.7 C)     Temp Source 12/24/14 1902 Oral     SpO2 12/24/14 1902 100 %     Weight --      Height --      Head Cir --      Peak Flow --      Pain Score --      Pain Loc --      Pain Edu? --      Excl. in [place]? --     Constitutional:  Alert, communicative. A little bit of a strange affect. No acute distress. ENT   Head: Normocephalic and atraumatic.   Nose: No congestion/rhinnorhea.       Mouth: No erythema, no swelling   Cardiovascular: Normal rate, regular rhythm, no murmur noted Respiratory:  Normal respiratory effort, no tachypnea.    Breath sounds are clear and equal bilaterally.  Gastrointestinal: Soft, no distention. Nontender Back: No muscle spasm, no tenderness, no CVA tenderness. Musculoskeletal: No deformity noted. Nontender with normal range of motion in all extremities.  No noted edema. Neurologic:  Communicative. Normal appearing spontaneous movement in all 4 extremities. No gross focal neurologic deficits are appreciated.  Skin:  Skin is warm, dry. No rash noted. Psychiatric: As noted in history of present illness, patient is reporting people coming into when those and into his house and lifting his bed and putting a Like structure over the bed and reflecting lites.  ____________________________________________    LABS (pertinent positives/negatives)  Labs Reviewed  CBC WITH  DIFFERENTIAL/PLATELET - Abnormal; Notable for the following:    RDW 14.9 (*)    All other components within normal limits  COMPREHENSIVE METABOLIC PANEL - Abnormal; Notable for the following:    Glucose, Bld 123 (*)    BUN 22 (*)    ALT 12 (*)    Total Bilirubin <0.1 (*)    All other components within normal limits  ETHANOL  URINALYSIS COMPLETEWITH MICROSCOPIC ([facility] ONLY)  URINE DRUG SCREEN, QUALITATIVE ([facility] ONLY)  CBG MONITORING, ED     ____________________________________________    RADIOLOGY  CT head:  ____________________________________________   PROCEDURES  Specialist on-call consultation: Pending  ____________________________________________   INITIAL IMPRESSION / ASSESSMENT AND PLAN / ED COURSE  Pertinent labs & imaging results that were available during my care of the patient were reviewed by me and considered in my medical decision making (see chart for details).  Pleasant, communicative, 54 year old male with apparent visual hallucinations. He believes people are trying to make him scared. He has difficulty with the idea that this may not be real. He is showing signs of psychosis. We will obtain a psychiatric specialist on-call consultation. The patient does appear to be safe and stable. I do not think he is a threat to himself or others. After a psychiatric consultation, he may be able to be discharged on medications with close outpatient follow-up. His wife is with him. I will ask my colleague, Dr. Burlene Arnt, to assume care of the patient at this time.  ____________________________________________   FINAL CLINICAL IMPRESSION(S) / ED DIAGNOSES  Final diagnoses:  Delusional disorder ([place])      Ahmed Prima, MD 12/24/14 2049

## 2014-12-24 NOTE — ED Notes (Signed)
States has been having hallucinations x 3 to 4 days. Seeing people that wife did not see. Pt in triage still has delusional conversation. Per wife no history of same. No new medications. Does take medications for but has not stopped them. Does not drink ETOH and has not stopped recently.

## 2014-12-24 NOTE — ED Notes (Signed)
Dr Lavon Paganini, telepysch, consulted with this RNn ATT

## 2014-12-25 ENCOUNTER — Observation Stay: Payer: 59

## 2014-12-25 ENCOUNTER — Encounter: Payer: Self-pay | Admitting: Internal Medicine

## 2014-12-25 DIAGNOSIS — R410 Disorientation, unspecified: Secondary | ICD-10-CM | POA: Diagnosis present

## 2014-12-25 LAB — RAPID HIV SCREEN (HIV 1/2 AB+AG)
HIV 1/2 Antibodies: NONREACTIVE
HIV-1 P24 Antigen - HIV24: NONREACTIVE

## 2014-12-25 LAB — TSH: TSH: 5.488 u[IU]/mL — ABNORMAL HIGH (ref 0.350–4.500)

## 2014-12-25 MED ORDER — OXYCODONE HCL 5 MG PO TABS
5.0000 mg | ORAL_TABLET | ORAL | Status: DC | PRN
  Administered 2014-12-25 (×2): 5 mg via ORAL
  Administered 2014-12-25: 10 mg via ORAL
  Administered 2014-12-26: 12:00:00 5 mg via ORAL
  Administered 2014-12-26: 08:00:00 10 mg via ORAL
  Filled 2014-12-25: qty 1
  Filled 2014-12-25 (×2): qty 2
  Filled 2014-12-25: qty 1
  Filled 2014-12-25: qty 2

## 2014-12-25 MED ORDER — ACETAMINOPHEN 325 MG PO TABS: 650.0000 mg | ORAL_TABLET | Freq: Four times a day (QID) | ORAL | Status: DC | PRN

## 2014-12-25 MED ORDER — LEVOFLOXACIN 500 MG PO TABS
500.0000 mg | ORAL_TABLET | Freq: Every day | ORAL | Status: DC
  Administered 2014-12-25 – 2014-12-26 (×2): 500 mg via ORAL
  Filled 2014-12-25 (×2): qty 1

## 2014-12-25 MED ORDER — HEPARIN SODIUM (PORCINE) 5000 UNIT/ML IJ SOLN
5000.0000 [IU] | Freq: Three times a day (TID) | INTRAMUSCULAR | Status: DC
  Administered 2014-12-25 – 2014-12-26 (×4): 5000 [IU] via SUBCUTANEOUS
  Filled 2014-12-25 (×4): qty 1

## 2014-12-25 MED ORDER — TIOTROPIUM BROMIDE MONOHYDRATE 18 MCG IN CAPS
18.0000 ug | ORAL_CAPSULE | Freq: Every day | RESPIRATORY_TRACT | Status: DC
  Administered 2014-12-25 – 2014-12-26 (×2): 18 ug via RESPIRATORY_TRACT
  Filled 2014-12-25: qty 5

## 2014-12-25 MED ORDER — MORPHINE SULFATE (PF) 2 MG/ML IV SOLN
2.0000 mg | INTRAVENOUS | Status: DC | PRN
  Administered 2014-12-25: 2 mg via INTRAVENOUS
  Filled 2014-12-25 (×2): qty 1

## 2014-12-25 MED ORDER — GABAPENTIN 100 MG PO CAPS
100.0000 mg | ORAL_CAPSULE | Freq: Three times a day (TID) | ORAL | Status: DC
  Administered 2014-12-25 – 2014-12-26 (×4): 100 mg via ORAL
  Filled 2014-12-25 (×4): qty 1

## 2014-12-25 MED ORDER — OXYCODONE HCL 5 MG PO TABS
ORAL_TABLET | ORAL | Status: AC
  Administered 2014-12-25: 5 mg via ORAL
  Filled 2014-12-25: qty 1

## 2014-12-25 MED ORDER — LORAZEPAM 0.5 MG PO TABS
0.5000 mg | ORAL_TABLET | Freq: Three times a day (TID) | ORAL | Status: DC | PRN
  Administered 2014-12-25 – 2014-12-26 (×4): 0.5 mg via ORAL
  Filled 2014-12-25 (×4): qty 1

## 2014-12-25 MED ORDER — MOMETASONE FURO-FORMOTEROL FUM 100-5 MCG/ACT IN AERO
2.0000 | INHALATION_SPRAY | Freq: Two times a day (BID) | RESPIRATORY_TRACT | Status: DC
  Administered 2014-12-25 – 2014-12-26 (×3): 2 via RESPIRATORY_TRACT
  Filled 2014-12-25: qty 8.8

## 2014-12-25 MED ORDER — OXYCODONE HCL 5 MG PO TABS
5.0000 mg | ORAL_TABLET | Freq: Once | ORAL | Status: AC
  Administered 2014-12-25: 5 mg via ORAL

## 2014-12-25 MED ORDER — GADOBENATE DIMEGLUMINE 529 MG/ML IV SOLN
15.0000 mL | Freq: Once | INTRAVENOUS | Status: AC | PRN
  Administered 2014-12-25: 11 mL via INTRAVENOUS

## 2014-12-25 MED ORDER — ONDANSETRON HCL 4 MG/2ML IJ SOLN: 4.0000 mg | Freq: Four times a day (QID) | INTRAMUSCULAR | Status: DC | PRN

## 2014-12-25 MED ORDER — BUPROPION HCL ER (SR) 100 MG PO TB12
200.0000 mg | ORAL_TABLET | Freq: Two times a day (BID) | ORAL | Status: DC
  Administered 2014-12-25 – 2014-12-26 (×3): 200 mg via ORAL
  Filled 2014-12-25 (×4): qty 2

## 2014-12-25 MED ORDER — ONDANSETRON HCL 4 MG PO TABS: 4.0000 mg | ORAL_TABLET | Freq: Four times a day (QID) | ORAL | Status: DC | PRN

## 2014-12-25 MED ORDER — ALBUTEROL SULFATE (2.5 MG/3ML) 0.083% IN NEBU
2.5000 mg | INHALATION_SOLUTION | Freq: Four times a day (QID) | RESPIRATORY_TRACT | Status: DC | PRN
Start: 2014-12-25 — End: 2014-12-26

## 2014-12-25 MED ORDER — ACETAMINOPHEN 650 MG RE SUPP: 650.0000 mg | Freq: Four times a day (QID) | RECTAL | Status: DC | PRN

## 2014-12-25 MED ORDER — DOCUSATE SODIUM 100 MG PO CAPS
100.0000 mg | ORAL_CAPSULE | Freq: Two times a day (BID) | ORAL | Status: DC
  Administered 2014-12-25 – 2014-12-26 (×3): 100 mg via ORAL
  Filled 2014-12-25 (×3): qty 1

## 2014-12-25 NOTE — Progress Notes (Signed)
   12/25/14 0600  Clinical Encounter Type  Visited With Patient  Visit Type Initial  Referral From Nurse  Spiritual Encounters  Spiritual Needs Emotional  Stress Factors  Patient Stress Factors Health changes  Family Stress Factors Health changes  Chaplain Jayziah Bankhead engaged patient regarding his health and concerns. Offered prayers and support.

## 2014-12-25 NOTE — Consult Note (Signed)
CC: visual halucinations.   HPI: Darryl Hatfield is an 54 y.o. male presents emergency department due to visual hallucinations. This is new that has been going on for the past week. No family history of such events in the past.  Pt has history of lung CA s/p chemo and radiation.      Past Medical History  Diagnosis Date  . COPD (chronic obstructive pulmonary disease) ([place])   . Lung cancer ([place]) 2015    RIght   . CHF (congestive heart failure) ([place])     Past Surgical History  Procedure Laterality Date  . Back surgery      Family History  Problem Relation Age of Onset  . Hypertension Other     Social History:  reports that he has quit smoking. He has never used smokeless tobacco. He reports that he does not drink alcohol or use illicit drugs.  Allergies  Allergen Reactions  . No Known Allergies     Medications: I have reviewed the patient's current medications.  ROS: History obtained from the patient  General ROS: negative for - chills, fatigue, fever, night sweats, weight gain or weight loss Psychological QIH:KVQQVZ halucinations.   Ophthalmic ROS: negative for - blurry vision, double vision, eye pain or loss of vision ENT ROS: negative for - epistaxis, nasal discharge, oral lesions, sore throat, tinnitus or vertigo Allergy and Immunology ROS: negative for - hives or itchy/watery eyes Hematological and Lymphatic ROS: negative for - bleeding problems, bruising or swollen lymph nodes Endocrine ROS: negative for - galactorrhea, hair pattern changes, polydipsia/polyuria or temperature intolerance Respiratory ROS: history of lung cancer Cardiovascular ROS: negative for - chest pain, dyspnea on exertion, edema or irregular heartbeat Gastrointestinal ROS: negative for - abdominal pain, diarrhea, hematemesis, nausea/vomiting or stool incontinence Genito-Urinary ROS: negative for - dysuria, hematuria, incontinence or urinary frequency/urgency Musculoskeletal ROS: negative for -  joint swelling or muscular weakness Neurological ROS: as noted in HPI Dermatological ROS: negative for rash and skin lesion changes  Physical Examination: Blood pressure 128/88, pulse 86, temperature 98.2 F (36.8 C), temperature source Oral, resp. rate 20, height '5\' 8"'$  (1.727 m), weight 121 lb 14.4 oz (55.293 kg), SpO2 97 %.   Neurological Examination Mental Status: Alert, oriented, thought content appropriate.  Speech fluent without evidence of aphasia.  Able to follow 3 step commands without difficulty. Cranial Nerves: II: Discs flat bilaterally; Visual fields grossly normal, pupils equal, round, reactive to light and accommodation III,IV, VI: ptosis not present, extra-ocular motions intact bilaterally V,VII: smile symmetric, facial light touch sensation normal bilaterally VIII: hearing normal bilaterally IX,X: gag reflex present XI: bilateral shoulder shrug XII: midline tongue extension Motor: Right : Upper extremity   5/5    Left:     Upper extremity   5/5  Lower extremity   5/5     Lower extremity   5/5 Tone and bulk:normal tone throughout; no atrophy noted Sensory: Pinprick and light touch intact throughout, bilaterally Deep Tendon Reflexes: 2+ and symmetric throughout Plantars: Right: downgoing   Left: downgoing Cerebellar: normal finger-to-nose, normal rapid alternating movements and normal heel-to-shin test Gait: not tested      Laboratory Studies:   Basic Metabolic Panel:  Recent Labs Lab 12/24/14 1957  NA 141  K 3.9  CL 106  CO2 28  GLUCOSE 123*  BUN 22*  CREATININE 1.19  CALCIUM 9.6    Liver Function Tests:  Recent Labs Lab 12/24/14 1957  AST 15  ALT 12*  ALKPHOS 80  BILITOT <  0.1*  PROT 7.6  ALBUMIN 4.3   No results for input(s): LIPASE, AMYLASE in the last 168 hours. No results for input(s): AMMONIA in the last 168 hours.  CBC:  Recent Labs Lab 12/24/14 1957  WBC 8.6  NEUTROABS 6.5  HGB 13.4  HCT 41.7  MCV 82.8  PLT 197     Cardiac Enzymes: No results for input(s): CKTOTAL, CKMB, CKMBINDEX, TROPONINI in the last 168 hours.  BNP: Invalid input(s): POCBNP  CBG: No results for input(s): GLUCAP in the last 168 hours.  Microbiology: Results for orders placed or performed in visit on 12/08/13  Culture, blood (single)     Status: None   Collection Time: 12/08/13  4:43 PM  Result Value Ref Range Status   Micro Text Report   Final       COMMENT                   NO GROWTH AEROBICALLY/ANAEROBICALLY IN 5 DAYS   ANTIBIOTIC                                                      Culture, blood (single)     Status: None   Collection Time: 12/08/13  4:43 PM  Result Value Ref Range Status   Micro Text Report   Final       COMMENT                   NO GROWTH AEROBICALLY/ANAEROBICALLY IN 5 DAYS   ANTIBIOTIC                                                      Culture, expectorated sputum-assessment     Status: None   Collection Time: 12/08/13 10:07 PM  Result Value Ref Range Status   Micro Text Report   Final       ORGANISM 1                RARE SERRATIA MARCESCENS   COMMENT                   -   GRAM STAIN                GOOD SPECIMEN-80-90% WBC   GRAM STAIN                FEW WHITE BLOOD CELLS   GRAM STAIN                FEW GRAM POSITIVE ROD   GRAM STAIN                FEW YEAST, RARE GRAM NEGATIVE ROD   ANTIBIOTIC                    ORG#1     CEFAZOLIN                     R         CEFOXITIN                     R         CEFTAZIDIME  S         CEFTRIAXONE                   S         CIPROFLOXACIN                 S         GENTAMICIN                    S         LEVOFLOXACIN                  S         Trimethoprim/Sulfamethoxazole S             Coagulation Studies: No results for input(s): LABPROT, INR in the last 72 hours.  Urinalysis:  Recent Labs Lab 12/24/14 2147  COLORURINE YELLOW*  LABSPEC 1.013  PHURINE 6.0  GLUCOSEU NEGATIVE  HGBUR NEGATIVE  BILIRUBINUR  NEGATIVE  KETONESUR NEGATIVE  PROTEINUR NEGATIVE  NITRITE NEGATIVE  LEUKOCYTESUR NEGATIVE    Lipid Panel:     Component Value Date/Time   CHOL 191 08/07/2013 0401   TRIG 122 08/07/2013 0401   HDL 55 08/07/2013 0401   VLDL 24 08/07/2013 0401   LDLCALC 112* 08/07/2013 0401    HgbA1C: No results found for: HGBA1C  Urine Drug Screen:     Component Value Date/Time   LABOPIA NONE DETECTED 12/24/2014 2147   LABBENZ NONE DETECTED 12/24/2014 2147   AMPHETMU NONE DETECTED 12/24/2014 2147   THCU NONE DETECTED 12/24/2014 2147   LABBARB NONE DETECTED 12/24/2014 2147    Alcohol Level:  Recent Labs Lab 12/24/14 1957  ETH <5      Imaging: Ct Head Wo Contrast  12/24/2014  CLINICAL DATA:  History of seizure disorders. Possible delusional thinking. History of lung cancer in remission. EXAM: CT HEAD WITHOUT CONTRAST TECHNIQUE: Contiguous axial images were obtained from the base of the skull through the vertex without intravenous contrast. COMPARISON:  MRI of the head 07/20/2013 FINDINGS: Brain: No evidence of acute infarction, hemorrhage, extra-axial collection, ventriculomegaly, or mass effect. There is moderate brain parenchymal volume loss. Vascular: No hyperdense vessel or unexpected calcification. Skull: Negative for fracture or focal lesion. Sinuses/Orbits: No acute findings. Other: None. IMPRESSION: No acute intracranial abnormality. Moderate brain parenchymal volume loss. Electronically Signed   By: Fidela Salisbury M.D.   On: 12/24/2014 21:17     Assessment/Plan:  54 y.o. male presents emergency department due to visual hallucinations. This is new that has been going on for the past week. No family history of such events in the past.  Pt has history of lung CA s/p chemo and radiation.    Agree with obtaining MRI brain with and w/out cancer. If no mets, looking for carcinomatous meningitis.  If that is negative would consider LP to look for cytology for carcinomatous  meningitis Could possibly be paraneoplastic, but above have to be ruled out.  Paraneoplastic work up can be done as out pt.  Psychiatric evaluation for visual hallucinations.   12/25/2014, 1:30 PM  Leotis Pain

## 2014-12-25 NOTE — Progress Notes (Signed)
[place] [facility] [place] NAME: Darryl Hatfield    MR#:  [id number]  DATE OF BIRTH:  04-Jan-1961  SUBJECTIVE:  CHIEF COMPLAINT:   Chief Complaint  Patient presents with  . Hallucinations  Visual hallucination  REVIEW OF SYSTEMS:  CONSTITUTIONAL: No fever, fatigue or weakness.  EYES: No blurred or double vision.  Visual hallucination EARS, NOSE, AND THROAT: No tinnitus or ear pain.  RESPIRATORY: No cough, shortness of breath, wheezing or hemoptysis.  CARDIOVASCULAR: No chest pain, orthopnea, edema.  GASTROINTESTINAL: No nausea, vomiting, diarrhea or abdominal pain.  GENITOURINARY: No dysuria, hematuria.  ENDOCRINE: No polyuria, nocturia,  HEMATOLOGY: No anemia, easy bruising or bleeding SKIN: No rash or lesion. MUSCULOSKELETAL: No joint pain or arthritis.   NEUROLOGIC: No tingling, numbness, weakness. Visual hallucination.  no syncope, loss of consciousness or seizure.  PSYCHIATRY: No anxiety or depression.   DRUG ALLERGIES:   Allergies  Allergen Reactions  . No Known Allergies     VITALS:  Blood pressure 124/84, pulse 87, temperature 97.5 F (36.4 C), temperature source Oral, resp. rate 20, height '5\' 8"'$  (1.727 m), weight 55.293 kg (121 lb 14.4 oz), SpO2 98 %.  PHYSICAL EXAMINATION:  GENERAL:  54 y.o.-year-old patient lying in the bed with no acute distress.  EYES: Pupils equal, round, reactive to light and accommodation. No scleral icterus. Extraocular muscles intact.  HEENT: Head atraumatic, normocephalic. Oropharynx and nasopharynx clear.  moist oral mucosa. NECK:  Supple, no jugular venous distention. No thyroid enlargement, no tenderness.  LUNGS: Normal breath sounds bilaterally, no wheezing, rales,rhonchi or crepitation. No use of accessory muscles of respiration.  CARDIOVASCULAR: S1, S2 normal. No murmurs, rubs, or gallops.  ABDOMEN: Soft, nontender, nondistended. Bowel sounds present. No organomegaly or mass.   EXTREMITIES: No pedal edema, cyanosis, or clubbing.  NEUROLOGIC: Cranial nerves II through XII are intact. Muscle strength 5/5 in all extremities. Sensation intact. Gait not checked.  PSYCHIATRIC: The patient is alert and oriented x 3.  SKIN: No obvious rash, lesion, or ulcer.    LABORATORY PANEL:   CBC  Recent Labs Lab 12/24/14 1957  WBC 8.6  HGB 13.4  HCT 41.7  PLT 197   ------------------------------------------------------------------------------------------------------------------  Chemistries   Recent Labs Lab 12/24/14 1957  NA 141  K 3.9  CL 106  CO2 28  GLUCOSE 123*  BUN 22*  CREATININE 1.19  CALCIUM 9.6  AST 15  ALT 12*  ALKPHOS 80  BILITOT <0.1*   ------------------------------------------------------------------------------------------------------------------  Cardiac Enzymes No results for input(s): TROPONINI in the last 168 hours. ------------------------------------------------------------------------------------------------------------------  RADIOLOGY:  Ct Head Wo Contrast  12/24/2014  CLINICAL DATA:  History of seizure disorders. Possible delusional thinking. History of lung cancer in remission. EXAM: CT HEAD WITHOUT CONTRAST TECHNIQUE: Contiguous axial images were obtained from the base of the skull through the vertex without intravenous contrast. COMPARISON:  MRI of the head 07/20/2013 FINDINGS: Brain: No evidence of acute infarction, hemorrhage, extra-axial collection, ventriculomegaly, or mass effect. There is moderate brain parenchymal volume loss. Vascular: No hyperdense vessel or unexpected calcification. Skull: Negative for fracture or focal lesion. Sinuses/Orbits: No acute findings. Other: None. IMPRESSION: No acute intracranial abnormality. Moderate brain parenchymal volume loss. Electronically Signed   By: Fidela Salisbury M.D.   On: 12/24/2014 21:17   Mr Jeri Cos GD Contrast  12/25/2014  CLINICAL DATA:  Visual hallucinations.   Delirium.  Lung cancer. EXAM: MRI HEAD WITHOUT AND WITH CONTRAST TECHNIQUE: Multiplanar, multiecho pulse sequences of the brain and  surrounding structures were obtained without and with intravenous contrast. CONTRAST:  81m MULTIHANCE GADOBENATE DIMEGLUMINE 529 MG/ML IV SOLN COMPARISON:  CT head 12/24/2014. MR brain 07/20/2013. PET scan 06/23/2013. FINDINGS: No evidence for acute infarction, hemorrhage, mass lesion, hydrocephalus, or extra-axial fluid. Premature for age cerebral and cerebellar atrophy. Minor white matter disease, nonspecific. Pituitary, pineal, and cerebellar tonsils unremarkable. No upper cervical lesions. Flow voids are maintained throughout the carotid, basilar, and vertebral arteries. RIGHT vertebral appears to end in PICA. There are no areas of chronic hemorrhage. Prominent perivascular spaces. Post infusion, no abnormal enhancement of the brain or meninges. Major dural venous sinuses are also patent. Normal Appearing orbits. No mastoid or middle ear fluid of significance. Significant fluid accumulation in the LEFT maxillary sinus, with mucosal thickening. Chronic sinusitis is likely. No frontal, ethmoid, or sphenoid sinus fluid of note. No definite extracranial soft tissue abnormalities. Compared with MR brain from 2015, the appearance is similar. IMPRESSION: Premature for age cerebral and cerebellar atrophy. No acute intracranial findings. No evidence for intracranial metastatic disease. Complete filling of the LEFT maxillary sinus with fluid associated with mucosal thickening, suggesting chronicity Electronically Signed   By: JStaci RighterM.D.   On: 12/25/2014 15:22    EKG:   Orders placed or performed during the hospital encounter of 12/24/14  . ED EKG  . ED EKG    ASSESSMENT AND PLAN:   Visual hallucination.  history of lung CA s/p chemo and radiation.   follow-up MRI of brain with and without contrast to rule out metastasis or carcinomatosis meningitis per Dr. ZIrish Elders   MRI brain: No evidence for intracranial metastatic disease. Follow-up psych consult.  Dehydration. Encourage oral fluid intake.  COPD: stable. Continue inhaled corticosteroid as well as Spiriva. Continue levofloxacin per outpatient scription.  Depression: Continue Wellbutrin until further recommendations from psychiatry  All the records are reviewed and case discussed with Care Management/Social Workerr. Management plans discussed with the patient, family and they are in agreement.  CODE STATUS: Full code  TOTAL TIME TAKING CARE OF THIS PATIENT: 37 minutes.  Greater than 50% time was spent on coordination of care and face-to-face counseling.  POSSIBLE D/C IN 2 DAYS, DEPENDING ON CLINICAL CONDITION.   CDemetrios LollM.D on 12/25/2014 at 3:49 PM  Between 7am to 6pm - Pager - [phone]  After 6pm go to [email or web address] - password [email or web address] [facility]  Office  [phone] CC: Primary care physician; No PCP Per Patient

## 2014-12-25 NOTE — Plan of Care (Signed)
Problem: Skin Integrity: Goal: Risk for impaired skin integrity will decrease Outcome: Progressing Pt requested the chaplin during shift. Pt report shoulder pain. Pt report that people are trying to make him think he is going crazy and he thinks it is his son and friends. Pt wanted to know my opinion about getting the sheriff involved. Asked pt to speak with spouse about his concerns. No other signs of distress noted. Will continue to monitor.

## 2014-12-25 NOTE — ED Notes (Signed)
Floor called for pt arrival

## 2014-12-25 NOTE — H&P (Signed)
Darryl Hatfield is an 54 y.o. male.   Chief Complaint: Hallucinations HPI: The patient presents emergency department due to visual hallucinations. He readily admits that he sees people in his ceiling and felt as if they were trying to capture him with a net. He also states that he has been seeing barbed wire in front of his bedroom door. His concern is that he would like to have the sheriff's department involved to protect his home. He adamantly states "I am not crazy". He was evaluated by telephone psychiatry in the emergency department felt as if his problem may be metabolic which brought to the emergency department staff to call for admission to the hospitalist service.  Past Medical History  Diagnosis Date  . COPD (chronic obstructive pulmonary disease) ([place])   . Lung cancer ([place]) 2015    RIght   . CHF (congestive heart failure) ([place])     Past Surgical History  Procedure Laterality Date  . Back surgery      Family History  Problem Relation Age of Onset  . Hypertension Other    Social History:  reports that he has quit smoking. He has never used smokeless tobacco. He reports that he does not drink alcohol or use illicit drugs.  Allergies:  Allergies  Allergen Reactions  . No Known Allergies     Prior to Admission medications   Medication Sig Start Date End Date Taking? Authorizing Provider  acetaminophen (TYLENOL) 325 MG tablet Take by mouth. 04/21/14  Yes Historical Provider, MD  albuterol (PROVENTIL) (2.5 MG/3ML) 0.083% nebulizer solution Take 2.5 mg by nebulization every 6 (six) hours as needed for wheezing or shortness of breath.   Yes Historical Provider, MD  buPROPion (WELLBUTRIN SR) 200 MG 12 hr tablet Take 1 tablet (200 mg total) by mouth 2 (two) times daily. 08/31/14  Yes Leia Alf, MD  Fluticasone-Salmeterol (ADVAIR) 250-50 MCG/DOSE AEPB Inhale 1 puff into the lungs 2 (two) times daily.   Yes Historical Provider, MD  gabapentin (NEURONTIN) 100 MG capsule Take 1  capsule (100 mg total) by mouth 3 (three) times daily. 09/12/14  Yes Leia Alf, MD  LORazepam (ATIVAN) 0.5 MG tablet Take 1 tablet (0.5 mg total) by mouth every 8 (eight) hours as needed for anxiety. 11/30/14  Yes Evlyn Kanner, NP  Multiple Vitamin (MULTI-VITAMINS) TABS Take by mouth.   Yes Historical Provider, MD  tiotropium (SPIRIVA) 18 MCG inhalation capsule Place 18 mcg into inhaler and inhale daily.   Yes Historical Provider, MD  levofloxacin (LEVAQUIN) 500 MG tablet Take 1 tablet (500 mg total) by mouth daily. Patient not taking: Reported on 11/03/2014 09/30/14   Leia Alf, MD  oxyCODONE (OXY IR/ROXICODONE) 5 MG immediate release tablet Take 1-2 tablets by mouth every 4 (four) hours as needed for moderate pain, severe pain or breakthrough pain.     Historical Provider, MD     Results for orders placed or performed during the hospital encounter of 12/24/14 (from the past 48 hour(s))  CBC with Differential     Status: Abnormal   Collection Time: 12/24/14  7:57 PM  Result Value Ref Range   WBC 8.6 3.8 - 10.6 K/uL   RBC 5.04 4.40 - 5.90 MIL/uL   Hemoglobin 13.4 13.0 - 18.0 g/dL   HCT 41.7 40.0 - 52.0 %   MCV 82.8 80.0 - 100.0 fL   MCH 26.5 26.0 - 34.0 pg   MCHC 32.0 32.0 - 36.0 g/dL   RDW 14.9 (H) 11.5 - 14.5 %  Platelets 197 150 - 440 K/uL   Neutrophils Relative % 75 %   Neutro Abs 6.5 1.4 - 6.5 K/uL   Lymphocytes Relative 13 %   Lymphs Abs 1.1 1.0 - 3.6 K/uL   Monocytes Relative 9 %   Monocytes Absolute 0.7 0.2 - 1.0 K/uL   Eosinophils Relative 2 %   Eosinophils Absolute 0.1 0 - 0.7 K/uL   Basophils Relative 1 %   Basophils Absolute 0.1 0 - 0.1 K/uL  Comprehensive metabolic panel     Status: Abnormal   Collection Time: 12/24/14  7:57 PM  Result Value Ref Range   Sodium 141 135 - 145 mmol/L   Potassium 3.9 3.5 - 5.1 mmol/L   Chloride 106 101 - 111 mmol/L   CO2 28 22 - 32 mmol/L   Glucose, Bld 123 (H) 65 - 99 mg/dL   BUN 22 (H) 6 - 20 mg/dL   Creatinine, Ser  1.19 0.61 - 1.24 mg/dL   Calcium 9.6 8.9 - 10.3 mg/dL   Total Protein 7.6 6.5 - 8.1 g/dL   Albumin 4.3 3.5 - 5.0 g/dL   AST 15 15 - 41 U/L   ALT 12 (L) 17 - 63 U/L   Alkaline Phosphatase 80 38 - 126 U/L   Total Bilirubin <0.1 (L) 0.3 - 1.2 mg/dL   GFR calc non Af Amer >60 >60 mL/min   GFR calc Af Amer >60 >60 mL/min    Comment: (NOTE) The eGFR has been calculated using the CKD EPI equation. This calculation has not been validated in all clinical situations. eGFR's persistently <60 mL/min signify possible Chronic Kidney Disease.    Anion gap 7 5 - 15  Ethanol     Status: None   Collection Time: 12/24/14  7:57 PM  Result Value Ref Range   Alcohol, Ethyl (B) <5 <5 mg/dL    Comment:        LOWEST DETECTABLE LIMIT FOR SERUM ALCOHOL IS 5 mg/dL FOR MEDICAL PURPOSES ONLY   Urinalysis complete, with microscopic     Status: Abnormal   Collection Time: 12/24/14  9:47 PM  Result Value Ref Range   Color, Urine YELLOW (A) YELLOW   APPearance CLEAR (A) CLEAR   Glucose, UA NEGATIVE NEGATIVE mg/dL   Bilirubin Urine NEGATIVE NEGATIVE   Ketones, ur NEGATIVE NEGATIVE mg/dL   Specific Gravity, Urine 1.013 1.005 - 1.030   Hgb urine dipstick NEGATIVE NEGATIVE   pH 6.0 5.0 - 8.0   Protein, ur NEGATIVE NEGATIVE mg/dL   Nitrite NEGATIVE NEGATIVE   Leukocytes, UA NEGATIVE NEGATIVE   RBC / HPF 0-5 0 - 5 RBC/hpf   WBC, UA 0-5 0 - 5 WBC/hpf   Bacteria, UA NONE SEEN NONE SEEN   Squamous Epithelial / LPF NONE SEEN NONE SEEN   Mucous PRESENT    Hyaline Casts, UA PRESENT   Urine Drug Screen, Qualitative ([facility] only)     Status: None   Collection Time: 12/24/14  9:47 PM  Result Value Ref Range   Tricyclic, Ur Screen NONE DETECTED NONE DETECTED   Amphetamines, Ur Screen NONE DETECTED NONE DETECTED   MDMA (Ecstasy)Ur Screen NONE DETECTED NONE DETECTED   Cocaine Metabolite,Ur [place] NONE DETECTED NONE DETECTED   Opiate, Ur Screen NONE DETECTED NONE DETECTED   Phencyclidine (PCP) Ur S NONE DETECTED NONE  DETECTED   Cannabinoid 50 Ng, Ur  NONE DETECTED NONE DETECTED   Barbiturates, Ur Screen NONE DETECTED NONE DETECTED   Benzodiazepine, Ur Scrn NONE DETECTED NONE  DETECTED   Methadone Scn, Ur NONE DETECTED NONE DETECTED    Comment: (NOTE) 355  Tricyclics, urine               Cutoff 1000 ng/mL 200  Amphetamines, urine             Cutoff 1000 ng/mL 300  MDMA (Ecstasy), urine           Cutoff 500 ng/mL 400  Cocaine Metabolite, urine       Cutoff 300 ng/mL 500  Opiate, urine                   Cutoff 300 ng/mL 600  Phencyclidine (PCP), urine      Cutoff 25 ng/mL 700  Cannabinoid, urine              Cutoff 50 ng/mL 800  Barbiturates, urine             Cutoff 200 ng/mL 900  Benzodiazepine, urine           Cutoff 200 ng/mL 1000 Methadone, urine                Cutoff 300 ng/mL 1100 1200 The urine drug screen provides only a preliminary, unconfirmed 1300 analytical test result and should not be used for non-medical 1400 purposes. Clinical consideration and professional judgment should 1500 be applied to any positive drug screen result due to possible 1600 interfering substances. A more specific alternate chemical method 1700 must be used in order to obtain a confirmed analytical result.  1800 Gas chromato graphy / mass spectrometry (GC/MS) is the preferred 1900 confirmatory method.    Ct Head Wo Contrast  12/24/2014  CLINICAL DATA:  History of seizure disorders. Possible delusional thinking. History of lung cancer in remission. EXAM: CT HEAD WITHOUT CONTRAST TECHNIQUE: Contiguous axial images were obtained from the base of the skull through the vertex without intravenous contrast. COMPARISON:  MRI of the head 07/20/2013 FINDINGS: Brain: No evidence of acute infarction, hemorrhage, extra-axial collection, ventriculomegaly, or mass effect. There is moderate brain parenchymal volume loss. Vascular: No hyperdense vessel or unexpected calcification. Skull: Negative for fracture or focal lesion.  Sinuses/Orbits: No acute findings. Other: None. IMPRESSION: No acute intracranial abnormality. Moderate brain parenchymal volume loss. Electronically Signed   By: Fidela Salisbury M.D.   On: 12/24/2014 21:17    Review of Systems  Constitutional: Negative for fever and chills.  HENT: Negative for sore throat and tinnitus.   Eyes: Negative for blurred vision and redness.  Respiratory: Negative for cough and shortness of breath.   Cardiovascular: Negative for chest pain, palpitations, orthopnea and PND.  Gastrointestinal: Negative for nausea, vomiting, abdominal pain and diarrhea.  Genitourinary: Negative for dysuria, urgency and frequency.  Musculoskeletal: Negative for myalgias and joint pain.  Skin: Negative for rash.       No lesions  Neurological: Negative for speech change, focal weakness and weakness.  Endo/Heme/Allergies: Does not bruise/bleed easily.       No temperature intolerance  Psychiatric/Behavioral: Positive for hallucinations. Negative for depression and suicidal ideas.    Blood pressure 118/82, pulse 79, temperature 98 F (36.7 C), temperature source Oral, resp. rate 17, height 5' 8"  (1.727 m), weight 57.153 kg (126 lb), SpO2 97 %. Physical Exam  Nursing note and vitals reviewed. Constitutional: He is oriented to person, place, and time. He appears well-developed and well-nourished. No distress.  HENT:  Head: Normocephalic and atraumatic.  Mouth/Throat: Oropharynx is clear and moist.  Eyes: Conjunctivae  and EOM are normal. Pupils are equal, round, and reactive to light. No scleral icterus.  Neck: Normal range of motion. No JVD present. No tracheal deviation present. No thyromegaly present.  Right parotid mass (subtle)  Cardiovascular: Normal rate, regular rhythm and normal heart sounds.  Exam reveals no gallop and no friction rub.   No murmur heard. Respiratory: Effort normal and breath sounds normal. No respiratory distress.  GI: Soft. Bowel sounds are normal. He  exhibits no distension and no mass. There is no tenderness. There is no rebound and no guarding.  Genitourinary:  Deferred  Musculoskeletal: Normal range of motion. He exhibits no edema.  Lymphadenopathy:    He has no cervical adenopathy.  Neurological: He is alert and oriented to person, place, and time. No cranial nerve deficit.  Skin: Skin is warm and dry. No rash noted. No erythema.  Jaundice  Psychiatric: He has a normal mood and affect. His behavior is normal. Judgment and thought content normal.     Assessment/Plan This is a 54 year old Caucasian male admitted for acute delirium with visual hallucinations. 1. Delirium: Past medical history significant for lung cancer which frequently metastasizes to the brain. This could be a paraneoplastic syndrome. CT of the brain was negative for masses.  Usually metastasized lesions are not isodense however I have ordered an MRI to rule out intracranial mass. Notably, the patient admits that he did not receive whole brain radiation during his cancer treatment. Check for reversible causes of delirium as well. 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 2. COPD: Continue inhaled corticosteroid as well as Spiriva. Continue levofloxacin per outpatient scription. 3. Depression: Continue Wellbutrin until further recommendations from psychiatry 4. DVT prophylaxis: Heparin 5. GI prophylaxis: None as the patient is not critically ill The patient is a full code. Time spent on admission orders and patient care approximately 45 minutes.  Harrie Foreman 12/25/2014, 3:35 AM

## 2014-12-25 NOTE — Plan of Care (Signed)
Problem: Education: Goal: Knowledge of [facility] Education information/materials will improve Outcome: Progressing Discussed with patient his fall risk and changed him to moderate from high.  Instructed to call if he feels lightheaded or dizzy.  Pt has chronic pain in shoulders and does require pain medications.  Problem: Safety: Goal: Ability to remain free from injury will improve Outcome: Progressing Pt continues to have discussions concerning a blanket and large men coming down ropes into his yard.  States his son is probably letting them in the house.  Problem: Pain Managment: Goal: General experience of comfort will improve Outcome: Progressing Pain is chronic. Pian meds given when scheduled allows at pt's request.  Pain med s do help decrease pain per aptient  Problem: Skin Integrity: Goal: Risk for impaired skin integrity will decrease Outcome: Progressing Pt has no apparent skin issues

## 2014-12-25 NOTE — ED Notes (Signed)
Admitting MD at bedside.

## 2014-12-26 DIAGNOSIS — R441 Visual hallucinations: Secondary | ICD-10-CM

## 2014-12-26 DIAGNOSIS — H5316 Psychophysical visual disturbances: Secondary | ICD-10-CM

## 2014-12-26 DIAGNOSIS — F0391 Unspecified dementia with behavioral disturbance: Secondary | ICD-10-CM

## 2014-12-26 DIAGNOSIS — F03918 Unspecified dementia, unspecified severity, with other behavioral disturbance: Secondary | ICD-10-CM

## 2014-12-26 LAB — RPR: RPR Ser Ql: NONREACTIVE

## 2014-12-26 MED ORDER — QUETIAPINE FUMARATE 25 MG PO TABS: 25.0000 mg | ORAL_TABLET | Freq: Every day | ORAL | Status: DC

## 2014-12-26 NOTE — Discharge Summary (Signed)
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 SECONDARY DIAGNOSIS:   Past Medical History  Diagnosis Date  . COPD (chronic obstructive pulmonary disease) ([place])   . Lung cancer ([place]) 2015    RIght   . CHF (congestive heart failure) ([place])     HOSPITAL COURSE:   Visual hallucination.  history of lung CA s/p chemo and radiation.  MRI brain: No evidence for intracranial metastatic disease. follow-up MRI of brain with and without contrast to rule out metastasis or carcinomatosis meningitis per Dr. Irish Elders. The patient could not get lumbar puncture due to recent surgery per his wife.  Per Dr. Weber Cooks, this could be a combination of these, would be anxiety and mood symptoms, dementia with visual hallucinations, and a distant third partial complex seizures. His best guess is that this is a symptom of what seems like a worsening dementia.   Dehydration. Encourage oral fluid intake.  COPD: stable. Continue inhaled corticosteroid as well as Spiriva.  Depression: Continue Wellbutrin, Seroquel 25 mg by mouth daily per Dr.Clapacs, psychiatrist.   DISCHARGE CONDITIONS:   Stable, discharged home today.  CONSULTS OBTAINED:  Treatment Team:  Leotis Pain, MD Donita Brooks, MD Gonzella Lex, MD  DRUG ALLERGIES:   Allergies  Allergen Reactions  . No Known Allergies     DISCHARGE MEDICATIONS:   Current Discharge Medication List    START taking these medications   Details  QUEtiapine (SEROQUEL) 25 MG tablet Take 1 tablet (25 mg total) by mouth at bedtime. Qty: 30 tablet, Refills: 0      CONTINUE these medications which  have NOT CHANGED   Details  acetaminophen (TYLENOL) 325 MG tablet Take by mouth.    albuterol (PROVENTIL) (2.5 MG/3ML) 0.083% nebulizer solution Take 2.5 mg by nebulization every 6 (six) hours as needed for wheezing or shortness of breath.    buPROPion (WELLBUTRIN SR) 200 MG 12 hr tablet Take 1 tablet (200 mg total) by mouth 2 (two) times daily. Qty: 60 tablet, Refills: 2    Fluticasone-Salmeterol (ADVAIR) 250-50 MCG/DOSE AEPB Inhale 1 puff into the lungs 2 (two) times daily.    gabapentin (NEURONTIN) 100 MG capsule Take 1 capsule (100 mg total) by mouth 3 (three) times daily. Qty: 90 capsule, Refills: 4   Associated Diagnoses: Sciatica, unspecified laterality    LORazepam (ATIVAN) 0.5 MG tablet Take 1 tablet (0.5 mg total) by mouth every 8 (eight) hours as needed for anxiety. Qty: 90 tablet, Refills: 0    Multiple Vitamin (MULTI-VITAMINS) TABS Take by mouth.    tiotropium (SPIRIVA) 18 MCG inhalation capsule Place 18 mcg into inhaler and inhale daily.    oxyCODONE (OXY IR/ROXICODONE) 5 MG immediate release tablet Take 1-2 tablets by mouth every 4 (four) hours as needed for moderate pain, severe pain or breakthrough pain.       STOP taking these medications     levofloxacin (LEVAQUIN) 500 MG tablet          DISCHARGE INSTRUCTIONS:    If you experience worsening of your admission symptoms, develop shortness of breath, life threatening emergency, suicidal or homicidal thoughts you must seek medical attention immediately by calling  911 or calling your MD immediately  if symptoms less severe.  You Must read complete instructions/literature along with all the possible adverse reactions/side effects for all the Medicines you take and that have been prescribed to you. Take any new Medicines after you have completely understood and accept all the possible adverse reactions/side effects.   Please note  You were cared for by a hospitalist during your hospital stay. If you have any  questions about your discharge medications or the care you received while you were in the hospital after you are discharged, you can call the unit and asked to speak with the hospitalist on call if the hospitalist that took care of you is not available. Once you are discharged, your primary care physician will handle any further medical issues. Please note that NO REFILLS for any discharge medications will be authorized once you are discharged, as it is imperative that you return to your primary care physician (or establish a relationship with a primary care physician if you do not have one) for your aftercare needs so that they can reassess your need for medications and monitor your lab values.    Today   SUBJECTIVE   No complaint.   VITAL SIGNS:  Blood pressure 98/72, pulse 110, temperature 98.4 F (36.9 C), temperature source Oral, resp. rate 19, height '5\' 8"'$  (1.727 m), weight 56.155 kg (123 lb 12.8 oz), SpO2 98 %.  I/O:   Intake/Output Summary (Last 24 hours) at 12/26/14 1437 Last data filed at 12/26/14 0900  Gross per 24 hour  Intake    120 ml  Output      0 ml  Net    120 ml    PHYSICAL EXAMINATION:  GENERAL:  54 y.o.-year-old patient lying in the bed with no acute distress. Thin. EYES: Pupils equal, round, reactive to light and accommodation. No scleral icterus. Extraocular muscles intact.  HEENT: Head atraumatic, normocephalic. Oropharynx and nasopharynx clear.  NECK:  Supple, no jugular venous distention. No thyroid enlargement, no tenderness.  LUNGS: Normal breath sounds bilaterally, no wheezing, rales,rhonchi or crepitation. No use of accessory muscles of respiration.  CARDIOVASCULAR: S1, S2 normal. No murmurs, rubs, or gallops.  ABDOMEN: Soft, non-tender, non-distended. Bowel sounds present. No organomegaly or mass.  EXTREMITIES: No pedal edema, cyanosis, or clubbing.  NEUROLOGIC: Cranial nerves II through XII are intact. Muscle strength 5/5 in all extremities. Sensation  intact. Gait not checked.  PSYCHIATRIC: The patient is alert and oriented x 3.  SKIN: No obvious rash, lesion, or ulcer.   DATA REVIEW:   CBC  Recent Labs Lab 12/24/14 1957  WBC 8.6  HGB 13.4  HCT 41.7  PLT 197    Chemistries   Recent Labs Lab 12/24/14 1957  NA 141  K 3.9  CL 106  CO2 28  GLUCOSE 123*  BUN 22*  CREATININE 1.19  CALCIUM 9.6  AST 15  ALT 12*  ALKPHOS 80  BILITOT <0.1*    Cardiac Enzymes No results for input(s): TROPONINI in the last 168 hours.  Microbiology Results  Results for orders placed or performed in visit on 12/08/13  Culture, blood (single)     Status: None   Collection Time: 12/08/13  4:43 PM  Result Value Ref Range Status   Micro Text Report   Final       COMMENT                   NO GROWTH AEROBICALLY/ANAEROBICALLY IN 5 DAYS   ANTIBIOTIC  Culture, blood (single)     Status: None   Collection Time: 12/08/13  4:43 PM  Result Value Ref Range Status   Micro Text Report   Final       COMMENT                   NO GROWTH AEROBICALLY/ANAEROBICALLY IN 5 DAYS   ANTIBIOTIC                                                      Culture, expectorated sputum-assessment     Status: None   Collection Time: 12/08/13 10:07 PM  Result Value Ref Range Status   Micro Text Report   Final       ORGANISM 1                RARE SERRATIA MARCESCENS   COMMENT                   -   GRAM STAIN                GOOD SPECIMEN-80-90% WBC   GRAM STAIN                FEW WHITE BLOOD CELLS   GRAM STAIN                FEW GRAM POSITIVE ROD   GRAM STAIN                FEW YEAST, RARE GRAM NEGATIVE ROD   ANTIBIOTIC                    ORG#1     CEFAZOLIN                     R         CEFOXITIN                     R         CEFTAZIDIME                   S         CEFTRIAXONE                   S         CIPROFLOXACIN                 S         GENTAMICIN                    S         LEVOFLOXACIN                   S         Trimethoprim/Sulfamethoxazole S             RADIOLOGY:  Ct Head Wo Contrast  12/24/2014  CLINICAL DATA:  History of seizure disorders. Possible delusional thinking. History of lung cancer in remission. EXAM: CT HEAD WITHOUT CONTRAST TECHNIQUE: Contiguous axial images were obtained from the base of the skull through the vertex without intravenous contrast. COMPARISON:  MRI of the head 07/20/2013 FINDINGS: Brain: No evidence of acute infarction, hemorrhage, extra-axial collection, ventriculomegaly, or mass effect. There is  moderate brain parenchymal volume loss. Vascular: No hyperdense vessel or unexpected calcification. Skull: Negative for fracture or focal lesion. Sinuses/Orbits: No acute findings. Other: None. IMPRESSION: No acute intracranial abnormality. Moderate brain parenchymal volume loss. Electronically Signed   By: Fidela Salisbury M.D.   On: 12/24/2014 21:17   Mr Jeri Cos RV Contrast  12/25/2014  CLINICAL DATA:  Visual hallucinations.  Delirium.  Lung cancer. EXAM: MRI HEAD WITHOUT AND WITH CONTRAST TECHNIQUE: Multiplanar, multiecho pulse sequences of the brain and surrounding structures were obtained without and with intravenous contrast. CONTRAST:  50m MULTIHANCE GADOBENATE DIMEGLUMINE 529 MG/ML IV SOLN COMPARISON:  CT head 12/24/2014. MR brain 07/20/2013. PET scan 06/23/2013. FINDINGS: No evidence for acute infarction, hemorrhage, mass lesion, hydrocephalus, or extra-axial fluid. Premature for age cerebral and cerebellar atrophy. Minor white matter disease, nonspecific. Pituitary, pineal, and cerebellar tonsils unremarkable. No upper cervical lesions. Flow voids are maintained throughout the carotid, basilar, and vertebral arteries. RIGHT vertebral appears to end in PICA. There are no areas of chronic hemorrhage. Prominent perivascular spaces. Post infusion, no abnormal enhancement of the brain or meninges. Major dural venous sinuses are also patent. Normal Appearing orbits.  No mastoid or middle ear fluid of significance. Significant fluid accumulation in the LEFT maxillary sinus, with mucosal thickening. Chronic sinusitis is likely. No frontal, ethmoid, or sphenoid sinus fluid of note. No definite extracranial soft tissue abnormalities. Compared with MR brain from 2015, the appearance is similar. IMPRESSION: Premature for age cerebral and cerebellar atrophy. No acute intracranial findings. No evidence for intracranial metastatic disease. Complete filling of the LEFT maxillary sinus with fluid associated with mucosal thickening, suggesting chronicity Electronically Signed   By: JStaci RighterM.D.   On: 12/25/2014 15:22        Management plans discussed with the patient,his wife and they are in agreement.  CODE STATUS:     Code Status Orders        Start     Ordered   12/25/14 0440  Full code   Continuous     12/25/14 0439    Advance Directive Documentation        Most Recent Value   Type of Advance Directive  Living will   Pre-existing out of facility DNR order (yellow form or pink MOST form)     "MOST" Form in Place?        TOTAL TIME TAKING CARE OF THIS PATIENT: 36 minutes.    CDemetrios LollM.D on 12/26/2014 at 2:37 PM  Between 7am to 6pm - Pager - [phone]  After 6pm go to [email or web address] - password [email or web address]  Office  [phone] CC: Primary care physician; No PCP Per Patient

## 2014-12-26 NOTE — Discharge Instructions (Signed)
Regular diet. °Activity as tolerated. °

## 2014-12-26 NOTE — Consult Note (Signed)
[facility] Face-to-Face Psychiatry Consult   Reason for Consult:  Consult for this 54-year-old man with a history of cancer as well as a history of COPD congestive heart failure and back surgery for traumatic injuries. He is here in the hospital for evaluation of visual hallucinations Referring Physician:  Chen Patient Identification: Damarious N Harmon MRN:  [id number] Principal Diagnosis: Episodes of formed visual hallucinations Diagnosis:   Patient Active Problem List   Diagnosis Date Noted  . Episodes of formed visual hallucinations [H53.16] 12/26/2014  . 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 Delirium [R41.0] 12/25/2014  . SCIATICA [M54.30] 03/01/2010    Total Time spent with patient: 1 hour  Subjective:   Jaamal N Bebee is a 54 y.o. male patient admitted with "I don't think I'm hallucinating".  HPI:  Information from the patient and the chart as well as the patient's wife. Had a lengthy interview with the patient with his wife present who provided collateral and sometimes corrected history. Reviewed old chart. Reviewed labs reviewed the telemetry psychiatry consult and the neurology consult. Patient and his wife agree that the symptoms started within the past week. There were 2 and perhaps 3 incidents on which at night he believed that he saw people who were not seen by his wife or anyone else. He describes one episode of people coming into his bedroom and moving his bed around and dropping Annette from the ceiling. He describes another episode when he was outdoors in the car with his wife and saw people outside who were shaking a bedsheet up and down. Patient denies that he had had any of these symptoms previously. He denies that he has been feeling sad or depressed although with help from his wife he does admit to having some chronic frustration and anxiety. His sleep overall had not been very good and his appetite has been poor. He absolutely denies having any suicidal  thoughts. He denies that he had been abusing any substances. Additionally he states and his wife confirms that he had been off of all narcotic pain medicine for several weeks. There also were no recent changes in any of his medicines or changes in any dosages of anything he was taking. The patient denies having any memory problems. His wife says that she has noticed a few memory incidents recently. Additionally it sounds like he has very little activity during the day so that might not be much opportunity to evaluate this. Patient has not had any symptoms since actually being admitted to the hospital although he did have some visual hallucinations in the emergency room. He was evaluated by [facility] in the emergency room and they felt that this was most likely a medical concern. It turns out that none of his labs are remarkable. He had an MRI scan with and without contrast which shows some white matter shrinkage but nothing acute. Neurology consult did not find any new explanation.  Social history: Patient lives at home with his wife and 22-year-old son. He has not been able to work since being diagnosed with lung cancer last year. He also has not been able to do much physical activity since breaking his back and his shoulders this past spring. He misses it a great deal and feels frustrated about being inside. 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  Medical history: History of lung cancer no sign of any acute metastases. History of COPD although now he has   stopped smoking. Diagnosis of CHF. Diagnosis of fractures to his back and shoulders which apparently happened after a 1 time seizure last spring. He and his wife tell me that they were told at that time that a combination of Wellbutrin and other medicines could've caused the seizure despite which he continues to take Wellbutrin.  Substance abuse history: Past history of what sounds like pretty  normal social drinking but doesn't abuse any drugs or alcohol currently.  Past Psychiatric History: Patient at first denied any past psychiatric history but when his wife prompted him he admitted that 5 years ago shortly after his mother passed away he was hospitalized at [facility] for 3 days because he had made suicidal statements. It was at that time that he was started on bupropion and Ativan. He is been taking those medicines ever since then. Doesn't have any other past history of any other psychiatric medicines he has been taking. History no past history of any psychosis.  Risk to Self: Is patient at risk for suicide?: No Risk to Others:   Prior Inpatient Therapy:   Prior Outpatient Therapy:    Past Medical History:  Past Medical History  Diagnosis Date  . COPD (chronic obstructive pulmonary disease) ([place])   . Lung cancer ([place]) 2015    RIght   . CHF (congestive heart failure) ([place])     Past Surgical History  Procedure Laterality Date  . Back surgery     Family History:  Family History  Problem Relation Age of Onset  . Hypertension Other    Family Psychiatric  History: Patient denies any known family history of mental health or substance abuse problems Social History:  History  Alcohol Use No     History  Drug Use No    Social History   Social History  . Marital Status: Married    Spouse Name: N/A  . Number of Children: N/A  . Years of Education: N/A   Social History Main Topics  . Smoking status: Former Smoker -- 1.00 packs/day for 40 years  . Smokeless tobacco: Never Used  . Alcohol Use: No  . Drug Use: No  . Sexual Activity: Not Asked   Other Topics Concern  . None   Social History Narrative   Additional Social History:                          Allergies:   Allergies  Allergen Reactions  . No Known Allergies     Labs:  Results for orders placed or performed during the hospital encounter of 12/24/14 (from the past 48 hour(s))  CBC with  Differential     Status: Abnormal   Collection Time: 12/24/14  7:57 PM  Result Value Ref Range   WBC 8.6 3.8 - 10.6 K/uL   RBC 5.04 4.40 - 5.90 MIL/uL   Hemoglobin 13.4 13.0 - 18.0 g/dL   HCT 41.7 40.0 - 52.0 %   MCV 82.8 80.0 - 100.0 fL   MCH 26.5 26.0 - 34.0 pg   MCHC 32.0 32.0 - 36.0 g/dL   RDW 14.9 (H) 11.5 - 14.5 %   Platelets 197 150 - 440 K/uL   Neutrophils Relative % 75 %   Neutro Abs 6.5 1.4 - 6.5 K/uL   Lymphocytes Relative 13 %   Lymphs Abs 1.1 1.0 - 3.6 K/uL   Monocytes Relative 9 %   Monocytes Absolute 0.7 0.2 - 1.0 K/uL   Eosinophils Relative 2 %  Eosinophils Absolute 0.1 0 - 0.7 K/uL   Basophils Relative 1 %   Basophils Absolute 0.1 0 - 0.1 K/uL  Comprehensive metabolic panel     Status: Abnormal   Collection Time: 12/24/14  7:57 PM  Result Value Ref Range   Sodium 141 135 - 145 mmol/L   Potassium 3.9 3.5 - 5.1 mmol/L   Chloride 106 101 - 111 mmol/L   CO2 28 22 - 32 mmol/L   Glucose, Bld 123 (H) 65 - 99 mg/dL   BUN 22 (H) 6 - 20 mg/dL   Creatinine, Ser 1.19 0.61 - 1.24 mg/dL   Calcium 9.6 8.9 - 10.3 mg/dL   Total Protein 7.6 6.5 - 8.1 g/dL   Albumin 4.3 3.5 - 5.0 g/dL   AST 15 15 - 41 U/L   ALT 12 (L) 17 - 63 U/L   Alkaline Phosphatase 80 38 - 126 U/L   Total Bilirubin <0.1 (L) 0.3 - 1.2 mg/dL   GFR calc non Af Amer >60 >60 mL/min   GFR calc Af Amer >60 >60 mL/min    Comment: (NOTE) The eGFR has been calculated using the CKD EPI equation. This calculation has not been validated in all clinical situations. eGFR's persistently <60 mL/min signify possible Chronic Kidney Disease.    Anion gap 7 5 - 15  Ethanol     Status: None   Collection Time: 12/24/14  7:57 PM  Result Value Ref Range   Alcohol, Ethyl (B) <5 <5 mg/dL    Comment:        LOWEST DETECTABLE LIMIT FOR SERUM ALCOHOL IS 5 mg/dL FOR MEDICAL PURPOSES ONLY   Urinalysis complete, with microscopic     Status: Abnormal   Collection Time: 12/24/14  9:47 PM  Result Value Ref Range   Color,  Urine YELLOW (A) YELLOW   APPearance CLEAR (A) CLEAR   Glucose, UA NEGATIVE NEGATIVE mg/dL   Bilirubin Urine NEGATIVE NEGATIVE   Ketones, ur NEGATIVE NEGATIVE mg/dL   Specific Gravity, Urine 1.013 1.005 - 1.030   Hgb urine dipstick NEGATIVE NEGATIVE   pH 6.0 5.0 - 8.0   Protein, ur NEGATIVE NEGATIVE mg/dL   Nitrite NEGATIVE NEGATIVE   Leukocytes, UA NEGATIVE NEGATIVE   RBC / HPF 0-5 0 - 5 RBC/hpf   WBC, UA 0-5 0 - 5 WBC/hpf   Bacteria, UA NONE SEEN NONE SEEN   Squamous Epithelial / LPF NONE SEEN NONE SEEN   Mucous PRESENT    Hyaline Casts, UA PRESENT   Urine Drug Screen, Qualitative ([facility] only)     Status: None   Collection Time: 12/24/14  9:47 PM  Result Value Ref Range   Tricyclic, Ur Screen NONE DETECTED NONE DETECTED   Amphetamines, Ur Screen NONE DETECTED NONE DETECTED   MDMA (Ecstasy)Ur Screen NONE DETECTED NONE DETECTED   Cocaine Metabolite,Ur [place] NONE DETECTED NONE DETECTED   Opiate, Ur Screen NONE DETECTED NONE DETECTED   Phencyclidine (PCP) Ur S NONE DETECTED NONE DETECTED   Cannabinoid 50 Ng, Ur [place] NONE DETECTED NONE DETECTED   Barbiturates, Ur Screen NONE DETECTED NONE DETECTED   Benzodiazepine, Ur Scrn NONE DETECTED NONE DETECTED   Methadone Scn, Ur NONE DETECTED NONE DETECTED    Comment: (NOTE) 100  Tricyclics, urine               Cutoff 1000 ng/mL 200  Amphetamines, urine             Cutoff 1000 ng/mL 300  MDMA (Ecstasy), urine             Cutoff 500 ng/mL 400  Cocaine Metabolite, urine       Cutoff 300 ng/mL 500  Opiate, urine                   Cutoff 300 ng/mL 600  Phencyclidine (PCP), urine      Cutoff 25 ng/mL 700  Cannabinoid, urine              Cutoff 50 ng/mL 800  Barbiturates, urine             Cutoff 200 ng/mL 900  Benzodiazepine, urine           Cutoff 200 ng/mL 1000 Methadone, urine                Cutoff 300 ng/mL 1100 1200 The urine drug screen provides only a preliminary, unconfirmed 1300 analytical test result and should not be used for  non-medical 1400 purposes. Clinical consideration and professional judgment should 1500 be applied to any positive drug screen result due to possible 1600 interfering substances. A more specific alternate chemical method 1700 must be used in order to obtain a confirmed analytical result.  1800 Gas chromato graphy / mass spectrometry (GC/MS) is the preferred 1900 confirmatory method.   TSH     Status: Abnormal   Collection Time: 12/25/14  6:21 AM  Result Value Ref Range   TSH 5.488 (H) 0.350 - 4.500 uIU/mL  RPR     Status: None   Collection Time: 12/25/14  6:21 AM  Result Value Ref Range   RPR Ser Ql Non Reactive Non Reactive    Comment: (NOTE) Performed At: [facility] [address] [place], [place] [id number] [place] William F MD Ph:[phone]   Rapid HIV screen (HIV 1/2 Ab+Ag) ([facility] Only)     Status: None   Collection Time: 12/25/14  6:21 AM  Result Value Ref Range   HIV-1 P24 Antigen - HIV24 NON REACTIVE NON REACTIVE   HIV 1/2 Antibodies NON REACTIVE NON REACTIVE   Interpretation (HIV Ag Ab)      A non reactive test result means that HIV 1 or HIV 2 antibodies and HIV 1 p24 antigen were not detected in the specimen.    Current Facility-Administered Medications  Medication Dose Route Frequency Provider Last Rate Last Dose  . acetaminophen (TYLENOL) tablet 650 mg  650 mg Oral Q6H PRN Michael S Diamond, MD       Or  . acetaminophen (TYLENOL) suppository 650 mg  650 mg Rectal Q6H PRN Michael S Diamond, MD      . albuterol (PROVENTIL) (2.5 MG/3ML) 0.083% nebulizer solution 2.5 mg  2.5 mg Nebulization Q6H PRN Michael S Diamond, MD      . buPROPion (WELLBUTRIN SR) 12 hr tablet 200 mg  200 mg Oral BID Michael S Diamond, MD   200 mg at 12/26/14 0910  . docusate sodium (COLACE) capsule 100 mg  100 mg Oral BID Michael S Diamond, MD   100 mg at 12/26/14 0910  . gabapentin (NEURONTIN) capsule 100 mg  100 mg Oral TID Michael S Diamond, MD   100 mg at 12/26/14 0910  . heparin  injection 5,000 Units  5,000 Units Subcutaneous 3 times per day Michael S Diamond, MD   5,000 Units at 12/26/14 0620  . levofloxacin (LEVAQUIN) tablet 500 mg  500 mg Oral Daily Michael S Diamond, MD   500 mg at 12/26/14 0910  . LORazepam (ATIVAN) tablet 0.5 mg  0.5 mg Oral Q8H PRN Michael S   Diamond, MD   0.5 mg at 12/26/14 0910  . mometasone-formoterol (DULERA) 100-5 MCG/ACT inhaler 2 puff  2 puff Inhalation BID Michael S Diamond, MD   2 puff at 12/26/14 0910  . morphine 2 MG/ML injection 2 mg  2 mg Intravenous Q4H PRN Michael S Diamond, MD   2 mg at 12/25/14 0549  . ondansetron (ZOFRAN) tablet 4 mg  4 mg Oral Q6H PRN Michael S Diamond, MD       Or  . ondansetron (ZOFRAN) injection 4 mg  4 mg Intravenous Q6H PRN Michael S Diamond, MD      . oxyCODONE (Oxy IR/ROXICODONE) immediate release tablet 5-10 mg  5-10 mg Oral Q4H PRN Michael S Diamond, MD   5 mg at 12/26/14 1155  . tiotropium (SPIRIVA) inhalation capsule 18 mcg  18 mcg Inhalation Daily Michael S Diamond, MD   18 mcg at 12/26/14 0910   Facility-Administered Medications Ordered in Other Encounters  Medication Dose Route Frequency Provider Last Rate Last Dose  . sodium chloride 0.9 % injection 10 mL  10 mL Intracatheter PRN Janak Choksi, MD   10 mL at 06/27/14 1338  . sodium chloride 0.9 % injection 10 mL  10 mL Intracatheter PRN Sandeep Pandit, MD   10 mL at 08/22/14 0851    Musculoskeletal: Strength & Muscle Tone: decreased Gait & Station: normal Patient leans: N/A  Psychiatric Specialty Exam: Review of Systems  Constitutional: Negative.   HENT: Negative.   Eyes: Negative.   Respiratory: Negative.   Cardiovascular: Negative.   Gastrointestinal: Negative.   Musculoskeletal: Positive for back pain.  Skin: Negative.   Neurological: Positive for focal weakness.  Psychiatric/Behavioral: Positive for hallucinations and memory loss. Negative for depression, suicidal ideas and substance abuse. The patient is nervous/anxious and has  insomnia.     Blood pressure 108/70, pulse 87, temperature 97.8 F (36.6 C), temperature source Oral, resp. rate 20, height 5' 8" (1.727 m), weight 56.155 kg (123 lb 12.8 oz), SpO2 97 %.Body mass index is 18.83 kg/(m^2).  General Appearance: Casual  Eye Contact::  Good  Speech:  Garbled, Slurred and He is completely understandable but he does garble some of his words and slur his speech a little bit  Volume:  Normal  Mood:  Euthymic  Affect:  Seems a little bit anxious to me. He actually came close to being tearful when discussing his mother's death and his frustration about his current physical condition  Thought Process:  Circumstantial and Tangential  Orientation:  Other:  See details below about his Mini-Mental status  Thought Content:  Hallucinations: Visual  Suicidal Thoughts:  No  Homicidal Thoughts:  No  Memory:  Immediate;   Good Recent;   Poor Remote;   Fair  Judgement:  Impaired  Insight:  Shallow  Psychomotor Activity:  Normal  Concentration:  Poor  Recall:  Poor  Fund of Knowledge:Fair  Language: Fair  Akathisia:  No  Handed:  Left  AIMS (if indicated):     Assets:  Communication Skills Desire for Improvement Financial Resources/Insurance Housing Intimacy Resilience Social Support  ADL's:  Intact  Cognition: Impaired,  Mild  Sleep:      Treatment Plan Summary: Medication management and Plan This is a 54-year-old man with fairly new onset visual hallucinations so far with no clear explanation. During my interview with the patient I noticed that he does have some chronic anxiety and possibly more sadness than he is willing to admit to. He is clearly very hesitant to   disclose his feelings and it required his wife to get him to admit to some of his psychiatric history. It's possible that he may have a more pronounced depression but it's hard to be sure from this examination. Also remarkable was his Mini-Mental status exam. I did a standard Mini-Mental Status exam and  he scored 18. This was lower than I had even expected and he showed significant deficits. Couldn't remember any words whatsoever at 3 minutes and in fact confabulated some of them. Couldn't draw a figure couldn't write a sentence was disoriented as to the month. Differential diagnosis of visual hallucinations is wide. We don't have any evidence that this is related to drugs or metabolic disturbance that I can see. We don't have any evidence that this is related to a physical brain lesion. My thought at this point would be that the 3 most likely explanations, and there could be a combination of these, would be anxiety and mood symptoms, dementia with visual hallucinations, and a distant third partial complex seizures. My best guess is that this is a symptom of what seems like a worsening dementia. 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 I didn't specifically tell the patient and his wife that because I don't have any proof. I did tell them that this could be related to worsening memory problems. The tele-psychiatrist had suggested Abilify. I would correct that to recommending Seroquel which is usually more preferred in patients with possible Lewy body dementia. Also will do more to help him sleep. I would suggest 25 mg at night. I would not change any of his other medicines although I'm a little concerned that he continues to take the fairly high dose of bupropion especially given that he had a seizure in the past. I strongly encouraged the patient and especially his wife to keep an eye on the symptoms if they continue or worsen and that they need to go see their primary care doctor and keep on top of this because if the symptoms continue it may become more clear what is causing them. Wife and patient but especially wife clearly understand. Prescription written for Seroquel 25 mg at night. Follow-up with primary care  doctor.  Disposition: No evidence of imminent risk to self or others at present.   Patient does not meet criteria for psychiatric inpatient admission. Supportive therapy provided about ongoing stressors. C medication and recommendation plan above  Alethia Berthold 12/26/2014 12:59 PM

## 2014-12-26 NOTE — Progress Notes (Signed)
Pt to be discharged per MD order. IV removed. All instructions reviewed with pt and family, all questions answered. Pt will be taken out in wheelchair.

## 2014-12-28 ENCOUNTER — Telehealth: Payer: Self-pay | Admitting: *Deleted

## 2014-12-28 MED ORDER — LORAZEPAM 0.5 MG PO TABS: 0.5000 mg | ORAL_TABLET | Freq: Three times a day (TID) | ORAL | Status: DC | PRN

## 2014-12-28 NOTE — Telephone Encounter (Signed)
Faxed

## 2015-01-05 ENCOUNTER — Inpatient Hospital Stay: Payer: 59

## 2015-01-06 ENCOUNTER — Inpatient Hospital Stay: Payer: 59

## 2015-01-06 DIAGNOSIS — C3491 Malignant neoplasm of unspecified part of right bronchus or lung: Secondary | ICD-10-CM | POA: Diagnosis not present

## 2015-01-06 DIAGNOSIS — C801 Malignant (primary) neoplasm, unspecified: Secondary | ICD-10-CM

## 2015-01-06 DIAGNOSIS — Z452 Encounter for adjustment and management of vascular access device: Secondary | ICD-10-CM | POA: Diagnosis not present

## 2015-01-06 MED ORDER — SODIUM CHLORIDE 0.9 % IJ SOLN
10.0000 mL | INTRAMUSCULAR | Status: DC | PRN
  Administered 2015-01-06: 10 mL via INTRAVENOUS
  Filled 2015-01-06: qty 10

## 2015-01-06 MED ORDER — HEPARIN SOD (PORK) LOCK FLUSH 100 UNIT/ML IV SOLN
500.0000 [IU] | Freq: Once | INTRAVENOUS | Status: AC
  Administered 2015-01-06: 500 [IU] via INTRAVENOUS
  Filled 2015-01-06: qty 5

## 2015-01-09 ENCOUNTER — Telehealth: Payer: Self-pay | Admitting: *Deleted

## 2015-01-09 MED ORDER — BUPROPION HCL ER (SR) 200 MG PO TB12: 200.0000 mg | ORAL_TABLET | Freq: Two times a day (BID) | ORAL | Status: DC

## 2015-01-09 NOTE — Telephone Encounter (Signed)
Escribed

## 2015-01-17 ENCOUNTER — Other Ambulatory Visit: Payer: Self-pay | Admitting: Psychiatry

## 2015-01-17 MED ORDER — QUETIAPINE FUMARATE 25 MG PO TABS: 25.0000 mg | ORAL_TABLET | Freq: Every day | ORAL | Status: DC

## 2015-01-17 NOTE — Progress Notes (Signed)
This is a patient whom I saw one time as a consult in the hospital on December 5. He called the office today requesting another month of the low dose Seroquel. He says that his primary care doctor is out on maternity leave. I agreed to put in another 1 month of Seroquel 25 mg at night but without refills and told him that he would please need to contact whoever is covering for his primary care doctor for further medication. Patient agreed to this plan and the prescription is completed.

## 2015-01-31 ENCOUNTER — Telehealth: Payer: Self-pay | Admitting: *Deleted

## 2015-01-31 NOTE — Telephone Encounter (Signed)
Pt has a refill on current rx. He was advised of this

## 2015-02-02 ENCOUNTER — Other Ambulatory Visit: Payer: Self-pay | Admitting: *Deleted

## 2015-02-02 DIAGNOSIS — C3491 Malignant neoplasm of unspecified part of right bronchus or lung: Secondary | ICD-10-CM

## 2015-02-03 ENCOUNTER — Inpatient Hospital Stay: Payer: BLUE CROSS/BLUE SHIELD | Attending: Internal Medicine | Admitting: Internal Medicine

## 2015-02-03 ENCOUNTER — Encounter: Payer: Self-pay | Admitting: *Deleted

## 2015-02-03 ENCOUNTER — Inpatient Hospital Stay: Payer: BLUE CROSS/BLUE SHIELD

## 2015-02-03 ENCOUNTER — Encounter: Payer: Self-pay | Admitting: Internal Medicine

## 2015-02-03 VITALS — BP 114/83 | HR 87 | Temp 96.2°F | Resp 18 | Ht 68.0 in | Wt 125.0 lb

## 2015-02-03 DIAGNOSIS — I509 Heart failure, unspecified: Secondary | ICD-10-CM | POA: Insufficient documentation

## 2015-02-03 DIAGNOSIS — C3491 Malignant neoplasm of unspecified part of right bronchus or lung: Secondary | ICD-10-CM | POA: Diagnosis not present

## 2015-02-03 DIAGNOSIS — Z9221 Personal history of antineoplastic chemotherapy: Secondary | ICD-10-CM | POA: Diagnosis not present

## 2015-02-03 DIAGNOSIS — D649 Anemia, unspecified: Secondary | ICD-10-CM

## 2015-02-03 DIAGNOSIS — Z87891 Personal history of nicotine dependence: Secondary | ICD-10-CM | POA: Insufficient documentation

## 2015-02-03 DIAGNOSIS — Z923 Personal history of irradiation: Secondary | ICD-10-CM

## 2015-02-03 DIAGNOSIS — Z79899 Other long term (current) drug therapy: Secondary | ICD-10-CM

## 2015-02-03 DIAGNOSIS — J449 Chronic obstructive pulmonary disease, unspecified: Secondary | ICD-10-CM | POA: Insufficient documentation

## 2015-02-03 LAB — COMPREHENSIVE METABOLIC PANEL
ALK PHOS: 78 U/L (ref 38–126)
ALT: 15 U/L — AB (ref 17–63)
AST: 17 U/L (ref 15–41)
Albumin: 3.7 g/dL (ref 3.5–5.0)
Anion gap: 6 (ref 5–15)
BUN: 18 mg/dL (ref 6–20)
CALCIUM: 8.9 mg/dL (ref 8.9–10.3)
CHLORIDE: 101 mmol/L (ref 101–111)
CO2: 27 mmol/L (ref 22–32)
CREATININE: 1.12 mg/dL (ref 0.61–1.24)
Glucose, Bld: 95 mg/dL (ref 65–99)
Potassium: 3.8 mmol/L (ref 3.5–5.1)
Sodium: 134 mmol/L — ABNORMAL LOW (ref 135–145)
Total Bilirubin: 0.5 mg/dL (ref 0.3–1.2)
Total Protein: 6.9 g/dL (ref 6.5–8.1)

## 2015-02-03 LAB — CBC WITH DIFFERENTIAL/PLATELET
BASOS ABS: 0.1 10*3/uL (ref 0–0.1)
Basophils Relative: 1 %
Eosinophils Absolute: 0.2 10*3/uL (ref 0–0.7)
Eosinophils Relative: 3 %
HEMATOCRIT: 38.5 % — AB (ref 40.0–52.0)
HEMOGLOBIN: 12.6 g/dL — AB (ref 13.0–18.0)
LYMPHS ABS: 1 10*3/uL (ref 1.0–3.6)
LYMPHS PCT: 21 %
MCH: 25.7 pg — AB (ref 26.0–34.0)
MCHC: 32.6 g/dL (ref 32.0–36.0)
MCV: 78.9 fL — AB (ref 80.0–100.0)
Monocytes Absolute: 0.7 10*3/uL (ref 0.2–1.0)
Monocytes Relative: 14 %
NEUTROS ABS: 2.8 10*3/uL (ref 1.4–6.5)
NEUTROS PCT: 61 %
Platelets: 198 10*3/uL (ref 150–440)
RBC: 4.88 MIL/uL (ref 4.40–5.90)
RDW: 17.9 % — ABNORMAL HIGH (ref 11.5–14.5)
WBC: 4.7 10*3/uL (ref 3.8–10.6)

## 2015-02-03 LAB — FERRITIN: Ferritin: 36 ng/mL (ref 24–336)

## 2015-02-03 MED ORDER — ALBUTEROL SULFATE (2.5 MG/3ML) 0.083% IN NEBU: 2.5000 mg | INHALATION_SOLUTION | Freq: Four times a day (QID) | RESPIRATORY_TRACT | Status: AC | PRN

## 2015-02-03 MED ORDER — FLUTICASONE-SALMETEROL 250-50 MCG/DOSE IN AEPB: 1.0000 | INHALATION_SPRAY | Freq: Two times a day (BID) | RESPIRATORY_TRACT | Status: DC

## 2015-02-03 MED ORDER — HEPARIN SOD (PORK) LOCK FLUSH 100 UNIT/ML IV SOLN
500.0000 [IU] | Freq: Once | INTRAVENOUS | Status: AC
  Administered 2015-02-03: 500 [IU] via INTRAVENOUS

## 2015-02-03 MED ORDER — SODIUM CHLORIDE 0.9 % IJ SOLN
10.0000 mL | INTRAMUSCULAR | Status: DC | PRN
  Administered 2015-02-03: 10 mL via INTRAVENOUS
  Filled 2015-02-03: qty 10

## 2015-02-03 NOTE — Progress Notes (Signed)
Pt has no cough, no sputum production, breathing well.  Pt had back surgery and had ray cage  Inserted and he is doing well. Eating usual, no problems with bowels.

## 2015-02-03 NOTE — Progress Notes (Signed)
Pt needed refills of advair and proventil nebulizer treatments. Sent refills in electronically

## 2015-02-03 NOTE — Progress Notes (Signed)
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 Part B: RIGHT HILAR ADENOPATHY, EBUS ASSISTED BIOPSY: - METASTATIC NON-SMALL CELL CARCINOMA, FAVOR SQUAMOUS CELL CARCINOMA. Completed concurrent radiation with chemotherapy with Taxol/Carboplatin (cycle 4 day 15 chemo was on 08/02/13).    HISTORY OF PRESENT ILLNESS:  Patient returns for continued oncology follow-up, he is status post treatment for lung cancer as described above. Since his last appointment he had traumatic injury to the back, which was associated with a seizure episode, which could be due to a combination of medications, lowering seizure threshold. He required an extensive orthopedic procedure with placement of hardware in the lumbar spine. He, however, has recovered well from the surgery, and does not have any significant complaints at this point. He, unfortunately, does not use his nebulizers on a regular basis, which leads to worsening of shortness of breath and occasional cough.  REVIEW OF SYSTEMS:   ROS As in HPI above. In addition, no fever, chills. No new headaches or focal weakness.  No sore throat or dysphagia. No dizziness or palpitation. No abdominal pain, constipation, diarrhea, dysuria or hematuria. No new skin rash or bleeding symptoms. No new paresthesias in extremities. He denies change in color of stool. PS ECOG 2.  PAST MEDICAL HISTORY: Past Medical History  Diagnosis Date  . COPD (chronic obstructive pulmonary disease) ([place])   . Lung cancer ([place]) 2015    RIght   . 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          Seizure, rib fractures March 2016.    FAMILY HISTORY: Remarkable for heart disease. Father had unknown type of malignancy.     SOCIAL HISTORY: Chronic smoker, 1 pack a day for 40 years, quit June 2015. Works in the [employer]. Denies                                              alcohol or recreational drug usage.    Allergies  Allergen Reactions  . No Known Allergies     Current Outpatient Prescriptions  Medication Sig Dispense Refill  . acetaminophen (TYLENOL) 325 MG tablet Take by mouth.    Marland Kitchen albuterol (PROVENTIL) (2.5 MG/3ML) 0.083% nebulizer solution Take 2.5 mg by nebulization every 6 (six) hours as needed for wheezing or shortness of breath.    Marland Kitchen buPROPion (WELLBUTRIN SR) 200 MG 12 hr tablet Take 1 tablet (200 mg total) by mouth 2 (two) times daily. 60 tablet 2  . Fluticasone-Salmeterol (ADVAIR) 250-50 MCG/DOSE AEPB Inhale 1 puff into the lungs 2 (two) times daily.    Marland Kitchen gabapentin (NEURONTIN) 100 MG capsule Take 1 capsule (100 mg total) by mouth 3 (three) times daily. 90 capsule 4  . LORazepam (ATIVAN) 0.5 MG tablet Take 1 tablet (  0.5 mg total) by mouth every 8 (eight) hours as needed for anxiety. 90 tablet 1  . Multiple Vitamin (MULTI-VITAMINS) TABS Take by mouth.    . oxyCODONE (OXY IR/ROXICODONE) 5 MG immediate release tablet Take 1-2 tablets by mouth every 4 (four) hours as needed for moderate pain, severe pain or breakthrough pain.     Marland Kitchen QUEtiapine (SEROQUEL) 25 MG tablet Take 1 tablet (25 mg total) by mouth at bedtime. 30 tablet 0  . tiotropium (SPIRIVA) 18 MCG inhalation capsule Place 18 mcg into inhaler and inhale daily.     No current facility-administered medications for this visit.   Facility-Administered Medications  Ordered in Other Visits  Medication Dose Route Frequency Provider Last Rate Last Dose  . sodium chloride 0.9 % injection 10 mL  10 mL Intracatheter PRN Forest Gleason, MD   10 mL at 06/27/14 1338  . sodium chloride 0.9 % injection 10 mL  10 mL Intracatheter PRN Leia Alf, MD   10 mL at 08/22/14 0851    OBJECTIVE: Filed Vitals:   02/03/15 0955  BP: 114/83  Pulse: 87  Temp: 96.2 F (35.7 C)  Resp: 18     Body mass index is 19.01 kg/(m^2).    ECOG FS:2 - Symptomatic, <50% confined to bed  GENERAL: Alert and oriented and in no acute distress. No icterus. Think occasion male HEENT: EOMs intact.  No cervical lymphadenopathy. CVS: S1S2, regular LUNGS: Decreased breath sounds right upper lobe area, chronic finding. Otherwise bilateral good air entry, no rhonchi. ABDOMEN: Soft, nontender. No hepatomegaly clinically.  NEURO: grossly nonfocal, cranial nerves are intact.  EXTREMITIES: No pedal edema. Skin: Postsurgical scar in left lateral rib cage and a long the lumbar spine.  Lab Results  Component Value Date   WBC 8.6 12/24/2014   NEUTROABS 6.5 12/24/2014   HGB 13.4 12/24/2014   HCT 41.7 12/24/2014   MCV 82.8 12/24/2014   PLT 197 12/24/2014      Chemistry      Component Value Date/Time   NA 141 12/24/2014 1957   NA 142 01/17/2014 1028   K 3.9 12/24/2014 1957   K 4.0 01/17/2014 1028   CL 106 12/24/2014 1957   CL 102 01/17/2014 1028   CO2 28 12/24/2014 1957   CO2 30 01/17/2014 1028   BUN 22* 12/24/2014 1957   BUN 13 01/17/2014 1028   CREATININE 1.19 12/24/2014 1957   CREATININE 0.91 02/03/2014 0842      Component Value Date/Time   CALCIUM 9.6 12/24/2014 1957   CALCIUM 9.0 01/17/2014 1028   ALKPHOS 80 12/24/2014 1957   ALKPHOS 74 02/03/2014 0842   AST 15 12/24/2014 1957   AST 17 02/03/2014 0842   ALT 12* 12/24/2014 1957   ALT 26 02/03/2014 0842   BILITOT <0.1* 12/24/2014 1957   BILITOT 0.3 02/03/2014 0842      STUDIES:   CLINICAL DATA: Followup of lung  cancer. Status post chemotherapy and radiation therapy. Shortness of breath for greater than 1 year. Right upper lobe primary. COPD. Congestive heart failure.  EXAM: CT CHEST WITH CONTRAST  TECHNIQUE: Multidetector CT imaging of the chest was performed during intravenous contrast administration.  CONTRAST: 58m OMNIPAQUE IOHEXOL 300 MG/ML SOLN  COMPARISON: 05/24/2014. PET of 06/01/2014.  FINDINGS: Mediastinum/Nodes: No supraclavicular adenopathy. A right Port-A-Cath terminates at the high right atrium. Tortuous descending thoracic aorta. Normal heart size, without pericardial effusion. Right coronary artery atherosclerosis. No central pulmonary embolism, on this non-dedicated study. 11 mm AP window node on image  21 is unchanged. No hilar adenopathy.  Lungs/Pleura: No pleural fluid. Moderate centrilobular emphysema. Mild paraseptal emphysema. Mild Lower lobe predominant bronchial wall thickening. Linear left upper lobe scarring on image 20 is unchanged.  A 3 mm left lower lobe pulmonary nodule on image 43 is unchanged.  Central right upper lobe/ right suprahilar radiation fibrosis, without well-defined residual or recurrent lesion.  Upper abdomen: Normal imaged portions of the liver, spleen, pancreas, adrenal glands, kidneys. Proximal gastric underdistention.  Musculoskeletal: No acute osseous abnormality. Convex left thoracic spine curvature th.  IMPRESSION: 1. Similar appearance of right upper lobe radiation change. No locally recurrent or residual disease. 2. An AP window node is unchanged at 11 mm. This has been similar over prior exams, suggesting a benign etiology.   Electronically Signed  By: Abigail Miyamoto M.D.  On: 09/28/2014 12:25           Vitals     Height Weight BMI (Calculated)    5' 8.11" (1.73 m) 129 lb 6.6 oz (58.7 kg) 19.7      Interpretation Summary     CLINICAL DATA: Followup of lung cancer. Status post  chemotherapy and radiation therapy. Shortness of breath for greater than 1 year. Right upper lobe primary. COPD. Congestive heart failure.  EXAM: CT CHEST WITH CONTRAST  TECHNIQUE: Multidetector CT imaging of the chest was performed during intravenous contrast administration.  CONTRAST: 48m OMNIPAQUE IOHEXOL 300 MG/ML SOLN  COMPARISON: 05/24/2014. PET of 06/01/2014.  FINDINGS: Mediastinum/Nodes: No supraclavicular adenopathy. A right Port-A-Cath terminates at the high right atrium. Tortuous descending thoracic aorta. Normal heart size, without pericardial effusion. Right coronary artery atherosclerosis. No central pulmonary embolism, on this non-dedicated study. 11 mm AP window node on image 21 is unchanged. No hilar adenopathy.  Lungs/Pleura: No pleural fluid. Moderate centrilobular emphysema. Mild paraseptal emphysema. Mild Lower lobe predominant bronchial wall thickening. Linear left upper lobe scarring on image 20 is unchanged.  A 3 mm left lower lobe pulmonary nodule on image 43 is unchanged.  Central right upper lobe/ right suprahilar radiation fibrosis, without well-defined residual or recurrent lesion.  Upper abdomen: Normal imaged portions of the liver, spleen, pancreas, adrenal glands, kidneys. Proximal gastric underdistention.  Musculoskeletal: No acute osseous abnormality. Convex left thoracic spine curvature th.  IMPRESSION: 1. Similar appearance of right upper lobe radiation change. No locally recurrent or residual disease. 2. An AP window node is unchanged at 11 mm. This has been similar over prior exams, suggesting a benign etiology.   Electronically Signed  By: KAbigail MiyamotoM.D.  On: 09/28/2014 12:25       ASSESSMENT / PLAN:   1. 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 Part B: RIGHT HILAR ADENOPATHY, EBUS ASSISTED BIOPSY: - METASTATIC NON-SMALL CELL CARCINOMA, FAVOR SQUAMOUS CELL CARCINOMA -  once again we reviewed the images and the results of the CT scan of the chest from September 2016, and confirmed that there was no evidence of recurrence with stable AP window lymph node. We'll plan on repeating CT of the chest with contrast in March 2017, and we'll decide at that point whether to proceed with annual imaging studies for the next 3 years or continue with every 6 months imaging studies. Currently, there is no evidence of disease recurrence. 2. Portacath - continue port flush q6 weeks. If on the next CT  scan of the chest there is no evidence of disease recurrence, we will remove the Port-A-Cath. 3. Anemia - appears to be a new finding. Microcytic. Patient had colonoscopy in 2015, which reportedly was normal, he also denies bleeding or change in color of stool. We will check ferritin. 5. In between visits, patient advised to call or come to ER in case of any worsening or new symptoms, or acute sickness. He is agreeable to this plan. Return to our clinic in March 2017  Roxana Hires, MD   02/03/2015 9:06 AM

## 2015-02-20 ENCOUNTER — Ambulatory Visit: Payer: 59 | Admitting: Radiation Oncology

## 2015-02-24 ENCOUNTER — Inpatient Hospital Stay
Admission: RE | Admit: 2015-02-24 | Payer: BLUE CROSS/BLUE SHIELD | Source: Ambulatory Visit | Admitting: Radiation Oncology

## 2015-02-24 ENCOUNTER — Ambulatory Visit: Payer: BLUE CROSS/BLUE SHIELD | Admitting: Radiation Oncology

## 2015-03-08 ENCOUNTER — Other Ambulatory Visit: Payer: Self-pay | Admitting: *Deleted

## 2015-03-08 MED ORDER — LORAZEPAM 0.5 MG PO TABS: 0.5000 mg | ORAL_TABLET | Freq: Three times a day (TID) | ORAL | Status: DC | PRN

## 2015-03-14 ENCOUNTER — Other Ambulatory Visit: Payer: Self-pay | Admitting: *Deleted

## 2015-03-14 DIAGNOSIS — M5430 Sciatica, unspecified side: Secondary | ICD-10-CM

## 2015-03-15 MED ORDER — GABAPENTIN 100 MG PO CAPS: 100.0000 mg | ORAL_CAPSULE | Freq: Three times a day (TID) | ORAL | Status: DC

## 2015-03-17 ENCOUNTER — Inpatient Hospital Stay: Payer: MEDICAID | Attending: Internal Medicine

## 2015-03-17 ENCOUNTER — Ambulatory Visit: Payer: BLUE CROSS/BLUE SHIELD | Attending: Radiation Oncology | Admitting: Radiation Oncology

## 2015-03-29 ENCOUNTER — Encounter: Payer: Self-pay | Admitting: Emergency Medicine

## 2015-03-29 ENCOUNTER — Emergency Department: Payer: BLUE CROSS/BLUE SHIELD

## 2015-03-29 ENCOUNTER — Emergency Department
Admission: EM | Admit: 2015-03-29 | Discharge: 2015-03-30 | Disposition: A | Discharge status: Home / Self Care | Payer: BLUE CROSS/BLUE SHIELD | Attending: Emergency Medicine | Admitting: Emergency Medicine

## 2015-03-29 DIAGNOSIS — Z87891 Personal history of nicotine dependence: Secondary | ICD-10-CM | POA: Diagnosis not present

## 2015-03-29 DIAGNOSIS — Z79899 Other long term (current) drug therapy: Secondary | ICD-10-CM | POA: Diagnosis not present

## 2015-03-29 DIAGNOSIS — Z7951 Long term (current) use of inhaled steroids: Secondary | ICD-10-CM | POA: Insufficient documentation

## 2015-03-29 DIAGNOSIS — R0789 Other chest pain: Secondary | ICD-10-CM | POA: Insufficient documentation

## 2015-03-29 DIAGNOSIS — I509 Heart failure, unspecified: Secondary | ICD-10-CM | POA: Diagnosis not present

## 2015-03-29 DIAGNOSIS — R079 Chest pain, unspecified: Secondary | ICD-10-CM | POA: Diagnosis present

## 2015-03-29 HISTORY — DX: Unspecified convulsions: R56.9

## 2015-03-29 LAB — COMPREHENSIVE METABOLIC PANEL
ALBUMIN: 3.8 g/dL (ref 3.5–5.0)
ALT: 14 U/L — ABNORMAL LOW (ref 17–63)
ANION GAP: 5 (ref 5–15)
AST: 18 U/L (ref 15–41)
Alkaline Phosphatase: 69 U/L (ref 38–126)
BILIRUBIN TOTAL: 0.4 mg/dL (ref 0.3–1.2)
BUN: 24 mg/dL — AB (ref 6–20)
CHLORIDE: 100 mmol/L — AB (ref 101–111)
CO2: 30 mmol/L (ref 22–32)
Calcium: 9.2 mg/dL (ref 8.9–10.3)
Creatinine, Ser: 0.84 mg/dL (ref 0.61–1.24)
GFR calc Af Amer: >60 mL/min (ref >60)
GFR calc non Af Amer: >60 mL/min (ref >60)
GLUCOSE: 105 mg/dL — AB (ref 65–99)
POTASSIUM: 4.1 mmol/L (ref 3.5–5.1)
SODIUM: 135 mmol/L (ref 135–145)
TOTAL PROTEIN: 7.6 g/dL (ref 6.5–8.1)

## 2015-03-29 LAB — CBC WITH DIFFERENTIAL/PLATELET
BASOS ABS: 0.1 10*3/uL (ref 0–0.1)
Basophils Relative: 1 %
EOS ABS: 0 10*3/uL (ref 0–0.7)
EOS PCT: 1 %
HCT: 36.3 % — ABNORMAL LOW (ref 40.0–52.0)
HEMOGLOBIN: 12.2 g/dL — AB (ref 13.0–18.0)
LYMPHS ABS: 1 10*3/uL (ref 1.0–3.6)
Lymphocytes Relative: 12 %
MCH: 27.4 pg (ref 26.0–34.0)
MCHC: 33.6 g/dL (ref 32.0–36.0)
MCV: 81.5 fL (ref 80.0–100.0)
Monocytes Absolute: 0.6 10*3/uL (ref 0.2–1.0)
Monocytes Relative: 7 %
NEUTROS PCT: 79 %
Neutro Abs: 6.5 10*3/uL (ref 1.4–6.5)
PLATELETS: 191 10*3/uL (ref 150–440)
RBC: 4.45 MIL/uL (ref 4.40–5.90)
RDW: 17.1 % — ABNORMAL HIGH (ref 11.5–14.5)
WBC: 8.2 10*3/uL (ref 3.8–10.6)

## 2015-03-29 LAB — TROPONIN I
Troponin I: <0.03 ng/mL (ref <0.031)
Troponin I: <0.03 ng/mL (ref <0.031)

## 2015-03-29 LAB — PROTIME-INR
INR: 1.13
PROTHROMBIN TIME: 14.7 s (ref 11.4–15.0)

## 2015-03-29 LAB — LIPASE, BLOOD: Lipase: 14 U/L (ref 11–51)

## 2015-03-29 MED ORDER — SODIUM CHLORIDE 0.9 % IV BOLUS (SEPSIS)
500.0000 mL | Freq: Once | INTRAVENOUS | Status: AC
  Administered 2015-03-29: 500 mL via INTRAVENOUS

## 2015-03-29 MED ORDER — IOHEXOL 350 MG/ML SOLN
100.0000 mL | Freq: Once | INTRAVENOUS | Status: AC | PRN
  Administered 2015-03-29: 100 mL via INTRAVENOUS

## 2015-03-29 MED ORDER — ACETAMINOPHEN 325 MG PO TABS
650.0000 mg | ORAL_TABLET | Freq: Once | ORAL | Status: AC
  Administered 2015-03-29: 650 mg via ORAL
  Filled 2015-03-29: qty 2

## 2015-03-29 MED ORDER — ASPIRIN 81 MG PO CHEW
324.0000 mg | CHEWABLE_TABLET | Freq: Once | ORAL | Status: AC
  Administered 2015-03-29: 324 mg via ORAL
  Filled 2015-03-29: qty 4

## 2015-03-29 NOTE — ED Provider Notes (Addendum)
[facility] Emergency Department Provider Note  ____________________________________________   I have reviewed the triage vital signs and the nursing notes.   HISTORY  Chief Complaint Chest Pain    HPI Darryl Hatfield is a 55 y.o. male who presents today complaining of chest pain. He is pain-free at this time. He states he was eating chicken and he started having significant substernal chest pain which seemed to then spread to tingling down both his legs, and made him lightheaded. No significant shortness of breath with this event although he states he has been somewhat short of breath last few months. The pain was not pleuritic. It was sudden onset. Sharp. Nonradiating. Patient states she has not had pain like this before. He did have a cardiac catheterization about 8 years ago he states with no intervention needed and he has a history of right bundle-branch block. Patient denies any nausea or vomiting. At this time he is completely back to normal. At no time did he have any focal numbness or weakness. He states he just felt tingling. Patient does have a history of CHF according to patient and notes as well as anxiety, patient also has a history of lung cancer in remission for nearly 2 years after chemotherapy and radiation. He still has a port. History of PE or DVT. No recent travel no leg swelling, pain was not pleuritic and he was not really short of breath. Movement might of made the pain worse although this is not exactly clear. The pain went away on its on gradually and he is pain-free with no symptoms at this time. Pain was not tearing did not radiate to the back and was not during exertion. Did not feel as if he had something stuck in his throat.  Past Medical History  Diagnosis Date  . COPD (chronic obstructive pulmonary disease) ([place])   . Lung cancer ([place]) 2015    RIght   . CHF (congestive heart failure) ([place])   . Seizures [facility])     Patient Active Problem  List   Diagnosis Date Noted  . Episodes of formed visual hallucinations 12/26/2014  . Dementia with behavioral disturbance 12/26/2014  . Delirium 12/25/2014  . SCIATICA 03/01/2010    Past Surgical History  Procedure Laterality Date  . Back surgery      Current Outpatient Rx  Name  Route  Sig  Dispense  Refill  . acetaminophen (TYLENOL) 325 MG tablet   Oral   Take by mouth.         Marland Kitchen albuterol (PROVENTIL) (2.5 MG/3ML) 0.083% nebulizer solution   Nebulization   Take 3 mLs (2.5 mg total) by nebulization every 6 (six) hours as needed for wheezing or shortness of breath.   75 mL   2   . buPROPion (WELLBUTRIN SR) 200 MG 12 hr tablet   Oral   Take 1 tablet (200 mg total) by mouth 2 (two) times daily.   60 tablet   2   . Fluticasone-Salmeterol (ADVAIR) 250-50 MCG/DOSE AEPB   Inhalation   Inhale 1 puff into the lungs 2 (two) times daily.   60 each   2   . gabapentin (NEURONTIN) 100 MG capsule   Oral   Take 1 capsule (100 mg total) by mouth 3 (three) times daily.   90 capsule   4   . LORazepam (ATIVAN) 0.5 MG tablet   Oral   Take 1 tablet (0.5 mg total) by mouth every 8 (eight) hours as needed for  anxiety.   90 tablet   1   . Multiple Vitamin (MULTI-VITAMINS) TABS   Oral   Take by mouth.         . QUEtiapine (SEROQUEL) 25 MG tablet   Oral   Take 1 tablet (25 mg total) by mouth at bedtime.   30 tablet   0   . tiotropium (SPIRIVA) 18 MCG inhalation capsule   Inhalation   Place 18 mcg into inhaler and inhale daily.           Allergies No known allergies  Family History  Problem Relation Age of Onset  . Hypertension Other     Social History Social History  Substance Use Topics  . Smoking status: Former Smoker -- 1.00 packs/day for 40 years    Quit date: 06/20/2013  . Smokeless tobacco: Never Used  . Alcohol Use: No    Review of Systems Constitutional: No fever/chills Eyes: No visual changes. ENT: No sore throat. No stiff neck no neck  pain Cardiovascular: Positive chest pain. Respiratory: Denies shortness of breath. Gastrointestinal:   no vomiting.  No diarrhea.  No constipation. Genitourinary: Negative for dysuria. Musculoskeletal: Negative lower extremity swelling Skin: Negative for rash. Neurological: Negative for headaches, focal weakness or numbness. 10-point ROS otherwise negative.  ____________________________________________   PHYSICAL EXAM:  VITAL SIGNS: ED Triage Vitals  Enc Vitals Group     BP 03/29/15 1911 136/91 mmHg     Pulse Rate 03/29/15 1911 106     Resp 03/29/15 1911 19     Temp 03/29/15 1911 97.5 F (36.4 C)     Temp Source 03/29/15 1911 Oral     SpO2 03/29/15 1911 100 %     Weight 03/29/15 1911 120 lb (54.432 kg)     Height 03/29/15 1911 '5\' 8"'$  (1.727 m)     Head Cir --      Peak Flow --      Pain Score --      Pain Loc --      Pain Edu? --      Excl. in [place]? --     Constitutional: Alert and oriented. Well appearing and in no acute distress.  Eyes: Conjunctivae are normal. PERRL. EOMI. Head: Atraumatic. Nose: No congestion/rhinnorhea. Mouth/Throat: Mucous membranes are moist.  Oropharynx non-erythematous. Neck: No stridor.   Nontender with no meningismus Cardiovascular: Normal rate, regular rhythm. Grossly normal heart sounds.  Good peripheral circulation. Port in place in the right chest wall Respiratory: Normal respiratory effort.  No retractions. Lungs CTAB. Abdominal: Soft and nontender. No distention. No guarding no rebound Back:  There is no focal tenderness or step off there is no midline tenderness there are no lesions noted. there is no CVA tenderness Musculoskeletal: No lower extremity tenderness. No joint effusions, no DVT signs strong distal pulses no edema Neurologic:  Normal speech and language. No gross focal neurologic deficits are appreciated.  Skin:  Skin is warm, dry and intact. No rash noted. Psychiatric: Mood and affect are normal. Speech and behavior are  normal.  ____________________________________________   LABS (all labs ordered are listed, but only abnormal results are displayed)  Labs Reviewed  TROPONIN I  CBC WITH DIFFERENTIAL/PLATELET  PROTIME-INR  COMPREHENSIVE METABOLIC PANEL  LIPASE, BLOOD   ____________________________________________  EKG  I personally interpreted any EKGs ordered by me or triage Known right bundle-branch block, mild tachycardia rate 102 no acute ST elevation or acute ST depression normal axis ____________________________________________  RADIOLOGY  I reviewed any imaging ordered by  me or triage that were performed during my shift and, if possible, patient and/or family made aware of any abnormal findings. ____________________________________________   PROCEDURES  Procedure(s) performed: None  Critical Care performed: None  ____________________________________________   INITIAL IMPRESSION / ASSESSMENT AND PLAN / ED COURSE  Pertinent labs & imaging results that were available during my care of the patient were reviewed by me and considered in my medical decision making (see chart for details).  Patient presents today complaining of sudden onset atypical chest pain which lasted for a few hours at most began around 4:30 and is completely gone now. He had tingling in his legs and felt lightheaded for a little while. He has a normal neurologic exam at this time, no evidence of dissection in terms of peripheral pulses, the pain did not radiate to his back. EKG does not show acute ischemic changes. Differential diagnosis for this patient is quite broad and we will evaluate him with chest x-ray cardiac enzymes and cardiac monitoring. We will see if other imaging is necessary. Does not sound insistent with a pulmonary embolism given the sudden onset and lack of pleuritic nature however he does have some risk factors for that we will continue to consider this as a possibility but at this time I don't think  emergent CT scan is warranted for a patient with no symptoms at this time.  ----------------------------------------- 10:58 PM on 03/29/2015 -----------------------------------------  CT scan is negative for any acute pathology, there is a questionable hilar fullness which could represent a recurrence of his oncologic process which I have discussed with the patient. Patient understands need for PET scan. However, there is no evidence of pulmonary embolism there is no evidence of dissection or pneumonia. Patient is been pain-free since his arrival. He states that he did burp a few times and seemed to help his pain before he came in.  Serial abdominal exams are reassuring liver function tests and lipase are reassuring. We will send a second cardiac marker and reassess. Patient would very much prefer to go home rather than be admitted.Marland Kitchen  ----------------------------------------- 11:46 PM on 03/29/2015 -----------------------------------------  Patient has no pain or discomfort. He has not had any since he was eating the chicken earlier and he had a sharp discomfort after swallowing it. In any event, he states that sometimes he is burping here. But he has no other complaints abdomen is benign. Nor is there any evidence of meat impaction or foreign body I offered admission to the hospital but he declines. He elected to go home. We'll think this is unreasonable. He understands he must follow-up with a psychologist and he must follow-up with his cardiologist. I did discuss with his oncologist Dr. Lowell Bouton, the patient's finding of increased hilar fullness with the possibility of recurrent cancer and he will follow up closely with them. Extensive return precautions given and follow up understood. ____________________________________________   FINAL CLINICAL IMPRESSION(S) / ED DIAGNOSES  Final diagnoses:  None      This chart was dictated using voice recognition software.  Despite best efforts to  proofread,  errors can occur which can change meaning.     Schuyler Amor, MD 03/29/15 2001  Schuyler Amor, MD 03/29/15 3614  Schuyler Amor, MD 03/29/15 [phone]

## 2015-03-29 NOTE — Discharge Instructions (Signed)
Chest Pain Observation We have discussed admission to the hospital but he would prefer to go home. This is certainly not unreasonable but means that you must be vigilant. If you have chest pain, shortness of breath, nausea, vomiting, diarrhea, or you feel worse in any way you must return to the emergency department. Follow up closely with your primary care doctor who may refer you to a GI doctor. Follow closely with your oncologist as we did detect some change in your CT scan which may require a PET scan, follow closely with your cardiologist. It is often hard to give a specific diagnosis for the cause of chest pain. Among other possibilities your symptoms might be caused by inadequate oxygen delivery to your heart (angina). Angina that is not treated or evaluated can lead to a heart attack (myocardial infarction) or death. Blood tests, electrocardiograms, and X-rays may have been done to help determine a possible cause of your chest pain. After evaluation and observation, your health care provider has determined that it is unlikely your pain was caused by an unstable condition that requires hospitalization. However, a full evaluation of your pain may need to be completed, with additional diagnostic testing as directed. It is very important to keep your follow-up appointments. Not keeping your follow-up appointments could result in permanent heart damage, disability, or death. If there is any problem keeping your follow-up appointments, you must call your health care provider. HOME CARE INSTRUCTIONS  Due to the slight chance that your pain could be angina, it is important to follow your health care provider's treatment plan and also maintain a healthy lifestyle:  Maintain or work toward achieving a healthy weight.  Stay physically active and exercise regularly.  Decrease your salt intake.  Eat a balanced, healthy diet. Talk to a dietitian to learn about heart-healthy foods.  Increase your fiber intake  by including whole grains, vegetables, fruits, and nuts in your diet.  Avoid situations that cause stress, anger, or depression.  Take medicines as advised by your health care provider. Report any side effects to your health care provider. Do not stop medicines or adjust the dosages on your own.  Quit smoking. Do not use nicotine patches or gum until you check with your health care provider.  Keep your blood pressure, blood sugar, and cholesterol levels within normal limits.  Limit alcohol intake to no more than 1 drink per day for women who are not pregnant and 2 drinks per day for men.  Do not abuse drugs. SEEK IMMEDIATE MEDICAL CARE IF: You have severe chest pain or pressure which may include symptoms such as:  You feel pain or pressure in your arms, neck, jaw, or back.  You have severe back or abdominal pain, feel sick to your stomach (nauseous), or throw up (vomit).  You are sweating profusely.  You are having a fast or irregular heartbeat.  You feel short of breath while at rest.  You notice increasing shortness of breath during rest, sleep, or with activity.  You have chest pain that does not get better after rest or after taking your usual medicine.  You wake from sleep with chest pain.  You are unable to sleep because you cannot breathe.  You develop a frequent cough or you are coughing up blood.  You feel dizzy, faint, or experience extreme fatigue.  You develop severe weakness, dizziness, fainting, or chills. Any of these symptoms may represent a serious problem that is an emergency. Do not wait to see if  the symptoms will go away. Call your local emergency services (911 in the [place] Do not drive yourself to the hospital. MAKE SURE YOU:  Understand these instructions.  Will watch your condition.  Will get help right away if you are not doing well or get worse.   This information is not intended to replace advice given to you by your health care provider.  Make sure you discuss any questions you have with your health care provider.   Document Released: 02/09/2010 Document Revised: 01/12/2013 Document Reviewed: 07/09/2012 Elsevier Interactive Patient Education [employer].

## 2015-03-29 NOTE — ED Notes (Signed)
Per EMS, patient comes from home with c/o CP starting at 1400 today. Hx of CHF, depression, lung CA, panic attacks, and seizures. Patient states the pain today was worse with movement and that it made him dizzy. Patient denies pain at this time. States he still feels a little dizzy. A&O x4.

## 2015-03-29 NOTE — ED Notes (Signed)
Patient transported to CT 

## 2015-03-30 MED ORDER — HEPARIN SOD (PORK) LOCK FLUSH 100 UNIT/ML IV SOLN
500.0000 [IU] | Freq: Once | INTRAVENOUS | Status: AC
  Administered 2015-03-30: 500 [IU] via INTRAVENOUS

## 2015-03-30 MED ORDER — HEPARIN SOD (PORK) LOCK FLUSH 100 UNIT/ML IV SOLN
INTRAVENOUS | Status: AC
  Administered 2015-03-30: 500 [IU] via INTRAVENOUS
  Filled 2015-03-30: qty 5

## 2015-03-30 MED ORDER — SODIUM CHLORIDE 0.9% FLUSH
10.0000 mL | INTRAVENOUS | Status: DC | PRN
  Administered 2015-03-30: 10 mL via INTRAVENOUS

## 2015-03-30 NOTE — ED Notes (Signed)
Per protocol, portacath flushed with 72m NS then 500u/538mheparin and d/c'd; needle intact, dsng applied

## 2015-03-31 ENCOUNTER — Ambulatory Visit: Payer: BLUE CROSS/BLUE SHIELD | Admitting: Hematology and Oncology

## 2015-04-04 ENCOUNTER — Ambulatory Visit: Payer: BLUE CROSS/BLUE SHIELD | Admitting: Hematology and Oncology

## 2015-04-05 ENCOUNTER — Inpatient Hospital Stay: Payer: BLUE CROSS/BLUE SHIELD | Attending: Internal Medicine | Admitting: Hematology and Oncology

## 2015-04-05 ENCOUNTER — Inpatient Hospital Stay: Payer: BLUE CROSS/BLUE SHIELD

## 2015-04-05 VITALS — BP 115/74 | HR 88 | Temp 95.0°F | Resp 18 | Ht 68.0 in | Wt 127.0 lb

## 2015-04-05 DIAGNOSIS — D649 Anemia, unspecified: Secondary | ICD-10-CM

## 2015-04-05 DIAGNOSIS — I509 Heart failure, unspecified: Secondary | ICD-10-CM | POA: Diagnosis not present

## 2015-04-05 DIAGNOSIS — C7971 Secondary malignant neoplasm of right adrenal gland: Secondary | ICD-10-CM | POA: Insufficient documentation

## 2015-04-05 DIAGNOSIS — Z923 Personal history of irradiation: Secondary | ICD-10-CM | POA: Insufficient documentation

## 2015-04-05 DIAGNOSIS — Z9221 Personal history of antineoplastic chemotherapy: Secondary | ICD-10-CM | POA: Diagnosis not present

## 2015-04-05 DIAGNOSIS — Z87891 Personal history of nicotine dependence: Secondary | ICD-10-CM | POA: Insufficient documentation

## 2015-04-05 DIAGNOSIS — C3411 Malignant neoplasm of upper lobe, right bronchus or lung: Secondary | ICD-10-CM | POA: Diagnosis present

## 2015-04-05 DIAGNOSIS — C7972 Secondary malignant neoplasm of left adrenal gland: Secondary | ICD-10-CM | POA: Diagnosis not present

## 2015-04-05 DIAGNOSIS — D509 Iron deficiency anemia, unspecified: Secondary | ICD-10-CM | POA: Diagnosis not present

## 2015-04-05 DIAGNOSIS — Z79899 Other long term (current) drug therapy: Secondary | ICD-10-CM | POA: Diagnosis not present

## 2015-04-05 DIAGNOSIS — C771 Secondary and unspecified malignant neoplasm of intrathoracic lymph nodes: Secondary | ICD-10-CM | POA: Diagnosis not present

## 2015-04-05 DIAGNOSIS — J449 Chronic obstructive pulmonary disease, unspecified: Secondary | ICD-10-CM | POA: Diagnosis not present

## 2015-04-05 LAB — FOLATE: Folate: 13.8 ng/mL (ref >5.9)

## 2015-04-05 LAB — IRON AND TIBC
Iron: 38 ug/dL — ABNORMAL LOW (ref 45–182)
Saturation Ratios: 12 % — ABNORMAL LOW (ref 17.9–39.5)
TIBC: 310 ug/dL (ref 250–450)
UIBC: 272 ug/dL

## 2015-04-05 LAB — FERRITIN: Ferritin: 48 ng/mL (ref 24–336)

## 2015-04-05 LAB — SEDIMENTATION RATE: Sed Rate: 27 mm/hr — ABNORMAL HIGH (ref 0–20)

## 2015-04-05 NOTE — Progress Notes (Signed)
[facility] day:  04/05/2015  Chief Complaint: Darryl Hatfield is a 55 y.o. 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  HPI:  The patient was diagnosed with right lung cancer in 2015.  He presented with shortness of breath.  Chest CT on 06/21/2013 revealed a 1.8 cm right upper lobe spiculated mass highly suspicious for primary bronchogenic carcinoma. There was associated right hilar and right peritracheal mediastinal adenopathy consistent with metastatic adenopathy.  PET scan on 06/23/2013 revealed a 1.5 cm right upper lobe nodule (SUV 7.4) concerning for primary bronchogenic carcinoma.  There were hypermetabolic ipsilateral hilar and mediastinal lymph node metastasis. There was malignant range FDG uptake associated with bilateral level 2 cervical lymph nodes of uncertain significance.  Ultrasound core biopsy of a left upper neck lesion on 06/24/2013 (no pathology available).  Imaging was felt to represent parotid lesions.  EBUS assisted biopsy of a right lower paratracheal and right hilar lymph node on 07/02/2013 by Dr. Mortimer Fries confirmed non-small cell carcinoma, favoring squamous cell carcinoma.  Clinical stage was T1N2M0.  He received concurrent chemotherapy and radiation.  He received 6,000 cGy from 08/05/2014 - 09/14/2013.  He received weekly carboplatin and Taxol x 6 from 08/02/2013 - 10/25/2013.  Course was complicated by a late pneumothorax secondary to port placement.  He also had a seizure in 03/2014 with subsequent fracture of lumbar vertebrae and rod placement.  PET scan on 06/01/2014 revealed no evidence of residual hypermetabolic disease within the chest.  There was focal bilateral activity in the upper neck reflecting underlying bilateral parotid lesions (benign).  He was last seen in the medical oncology clinic on 02/03/2015 by Dr. Roxana Hires.  At that time, he was doing well.  CBC  revealed a hematocrit of 38.5, hemoglobin 12.6, MCV 78.9 consistent with iron deficiency anemia.  Ferritin was 36.  CMP was normal.  Hematocrit on 12/24/2014 was 41.7.  Chest CT from 09/28/2014 revealed right upper lobe radiation changes and a stable 1.1 cm AP window node.  The patient was seen in the emergency room on 03/29/2015 with chest pain after eating chicken.  As part of his evaluation, he underwent chest CT angiogram.  Imaging revealed no evidence of pulmonary embolism.  There was irregular soft tissue thickening around the right hilum which had progressed since 09/28/2014.  Findings were worrisome for recurrent tumor.  He states that his last colonoscopy was just prior to his diagnosis.  He has never had an EGD.  He denies any melena or hematochezia.  Regarding his diet, he "eats like a bird".  He eats oatmeal for breakfast.  He likes cheese and butter.  He does not eat much iron rich foods.  Symptomatically, he notes shortness of breath with exertion.  He has a little dry cough and chest discomfort.  He feels "alright" unless he exerts himself.  Weight is stable.  Past Medical History  Diagnosis Date  . COPD (chronic obstructive pulmonary disease) ([place])   . Lung cancer ([place]) 2015    RIght   . CHF (congestive heart failure) ([place])   . Seizures [facility])     Past Surgical History  Procedure Laterality Date  . Back surgery      Family History  Problem Relation Age of Onset  . Hypertension Other     Social History:  reports that he quit smoking about 21 months ago. He has never used smokeless tobacco. He reports  that he does not drink alcohol or use illicit drugs.  The patient is accompanied by his wife, Darryl Hatfield,  today.  Allergies:  Allergies  Allergen Reactions  . No Known Allergies     Current Medications: Current Outpatient Prescriptions  Medication Sig Dispense Refill  . acetaminophen (TYLENOL) 325 MG tablet Take by mouth.    Marland Kitchen albuterol (PROVENTIL) (2.5 MG/3ML) 0.083%  nebulizer solution Take 3 mLs (2.5 mg total) by nebulization every 6 (six) hours as needed for wheezing or shortness of breath. 75 mL 2  . buPROPion (WELLBUTRIN SR) 200 MG 12 hr tablet Take 1 tablet (200 mg total) by mouth 2 (two) times daily. 60 tablet 2  . Fluticasone-Salmeterol (ADVAIR) 250-50 MCG/DOSE AEPB Inhale 1 puff into the lungs 2 (two) times daily. 60 each 2  . gabapentin (NEURONTIN) 100 MG capsule Take 1 capsule (100 mg total) by mouth 3 (three) times daily. 90 capsule 4  . LORazepam (ATIVAN) 0.5 MG tablet Take 1 tablet (0.5 mg total) by mouth every 8 (eight) hours as needed for anxiety. 90 tablet 1  . Multiple Vitamin (MULTI-VITAMINS) TABS Take by mouth.    . QUEtiapine (SEROQUEL) 25 MG tablet Take 1 tablet (25 mg total) by mouth at bedtime. 30 tablet 0  . tiotropium (SPIRIVA) 18 MCG inhalation capsule Place 18 mcg into inhaler and inhale daily.     No current facility-administered medications for this visit.   Facility-Administered Medications Ordered in Other Visits  Medication Dose Route Frequency Provider Last Rate Last Dose  . sodium chloride 0.9 % injection 10 mL  10 mL Intracatheter PRN Forest Gleason, MD   10 mL at 06/27/14 1338  . sodium chloride 0.9 % injection 10 mL  10 mL Intracatheter PRN Leia Alf, MD   10 mL at 08/22/14 0851    Review of Systems:  GENERAL:  All right unless exerts self.  No fevers or sweats.  Maximum weight 135 pounds. PERFORMANCE STATUS (ECOG):  2 HEENT:  No visual changes, runny nose, sore throat, mouth sores or tenderness. Lungs: Shortness of breath with exertion.  Dry cough.  No hemoptysis. Cardiac:  Little chest discomfort.  No chest pain, palpitations, orthopnea, or PND. GI:  No nausea, vomiting, diarrhea, constipation, melena or hematochezia.  No prior EGD.  Colonoscopy prior to diagnosis. GU:  No urgency, frequency, dysuria, or hematuria. Musculoskeletal:  No back pain.  No joint pain.  No muscle tenderness. Extremities:  No pain or  swelling. Skin:  No rashes or skin changes. Neuro:  No headache, numbness or weakness, balance or coordination issues. Endocrine:  No diabetes, thyroid issues, hot flashes or night sweats. Psych:  No mood changes, depression or anxiety. Pain:  No focal pain. Review of systems:  All other systems reviewed and found to be negative.  Physical Exam: Blood pressure 115/74, pulse 88, temperature 95 F (35 C), temperature source Tympanic, resp. rate 18, height 5' 8"  (1.727 m), weight 126 lb 15.8 oz (57.6 kg), SpO2 100 %. GENERAL:  Thin gentleman sitting comfortably in the exam room in no acute distress. MENTAL STATUS:  Alert and oriented to person, place and time. HEAD:  Black hair with slight graying.  Temporal wasting.  Normocephalic, atraumatic, face symmetric, no Cushingoid features. EYES:  Glasses.  Blue eyes.  Pupils equal round and reactive to light and accomodation.  No conjunctivitis or scleral icterus. ENT:  Oropharynx clear without lesion.  Dentures.  Tongue normal. Mucous membranes moist.  RESPIRATORY:  Clear to auscultation without rales, wheezes  or rhonchi. CARDIOVASCULAR:  Regular rate and rhythm without murmur, rub or gallop. CHEST:  Port-a-cath in place. ABDOMEN:  Soft, non-tender, with active bowel sounds, and no hepatosplenomegaly.  No masses. SKIN:  Hypopigmented scarring upper extremities.  No rashes, ulcers or lesions. EXTREMITIES: No edema, no skin discoloration or tenderness.  No palpable cords. LYMPH NODES: No palpable cervical, supraclavicular, axillary or inguinal adenopathy  NEUROLOGICAL: Unremarkable.  Tremulous.  Anxious. PSYCH:  Appropriate.  Appointment on 04/05/2015  Component Date Value Ref Range Status  . Ferritin 04/05/2015 48  24 - 336 ng/mL Final  . Iron 04/05/2015 38* 45 - 182 ug/dL Final  . TIBC 04/05/2015 310  250 - 450 ug/dL Final  . Saturation Ratios 04/05/2015 12* 17.9 - 39.5 % Final  . UIBC 04/05/2015 272   Final  . Sed Rate 04/05/2015 27* 0 - 20  mm/hr Final  . Folate 04/05/2015 13.8  >5.9 ng/mL Final  . TSH 04/05/2015 8.840* 0.450 - 4.500 uIU/mL Final  . T4, Total 04/05/2015 8.1  4.5 - 12.0 ug/dL Final  . T3 Uptake Ratio 04/05/2015 28  24 - 39 % Final  . Free Thyroxine Index 04/05/2015 2.3  1.2 - 4.9 Final   Comment: (NOTE) Performed At: [facility] [address] [place], [place] [id number] Lindon Romp MD BJ:[id number]     Assessment:  KIYOSHI SCHAAB is a 55 y.o. 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  PET scan on 06/23/2013 revealed a 1.5 cm right upper lobe nodule (SUV 7.4) concerning for primary bronchogenic carcinoma.  There were hypermetabolic ipsilateral hilar and mediastinal lymph node metastasis. There was malignant range FDG uptake associated with bilateral level 2 cervical lymph nodes of uncertain significance.  EBUS assisted biopsy of a right lower paratracheal and right hilar lymph node on 07/02/2013 confirmed non-small cell carcinoma, favoring squamous cell carcinoma.  Clinical stage was T1N2M0.  He received concurrent chemotherapy and radiation.  He received 6,000 cGy from 08/05/2014 - 09/14/2013.  He received weekly carboplatin and Taxol x 6 from 08/02/2013 - 10/25/2013.  Course was complicated by a late pneumothorax secondary to port placement.  He also had a seizure in 03/2014 with subsequent fracture of lumbar vertebrae and rod placement.  PET scan on 06/01/2014 revealed no evidence of residual hypermetabolic disease within the chest.  There was focal bilateral activity in the upper neck reflecting underlying bilateral parotid lesions (benign).  Chest CT angiogram on 03/29/2015 revealed no evidence of pulmonary embolism.  There was irregular soft tissue thickening around the right hilum which had progressed since 09/28/2014.  Findings were worrisome for recurrent tumor.  He has developed progressive anemia over the past 3 months.  Hematocrit has decreased  from 41.7 to 36.3.  His last colonoscopy was just prior to his diagnosis.  He has never had an EGD.  He denies any melena or hematochezia.  Diet is poor.  Symptomatically, he notes shortness of breath with exertion.  He has a little dry cough and chest discomfort.  Weight is stable.  Plan: 1.  Review entire medical history, diagnosis and treatment of stage IIIA lung cancer.  Discuss recent imaging studies.  Discuss plan for PET scan to assess irregular soft tissue around right hilum. 2.  Discuss progressive anemia over the past 3 months.  Hematocrit has decreased from 41.7 to 36.3.  He denies any bleeding.  His diet appears poor.  Discuss initial work-up with labs and guaiac cards. 3.  Review labs  from emergency room. 4.  Labs today:  ferritin, iron studies, ESR, B12, folate, TSH. 5.  Obtain pathology of neck node biopsy from 06/24/2013. 6.  Schedule PET scan: assess irregular soft tissue around right hilum. 7.  RTC for MD assessment after PET scan to discuss direction of therapy.   Lequita Asal, MD   04/05/2015, 1:47 PM

## 2015-04-06 ENCOUNTER — Telehealth: Payer: Self-pay

## 2015-04-06 DIAGNOSIS — C3411 Malignant neoplasm of upper lobe, right bronchus or lung: Secondary | ICD-10-CM | POA: Diagnosis not present

## 2015-04-06 LAB — THYROID PANEL WITH TSH
Free Thyroxine Index: 2.3 (ref 1.2–4.9)
T3 Uptake Ratio: 28 % (ref 24–39)
T4, Total: 8.1 ug/dL (ref 4.5–12.0)
TSH: 8.84 u[IU]/mL — ABNORMAL HIGH (ref 0.450–4.500)

## 2015-04-06 LAB — VITAMIN B12: Vitamin B-12: 255 pg/mL (ref 180–914)

## 2015-04-06 NOTE — Telephone Encounter (Signed)
Called pt per MD and asked patient to start oral iron (ferrous sulfate) 1 tablet a day with OJ or vitamin C.  Spoke with wife per pt to repeat that.  Also per MD I will fax labs (TSH) to PCP [facility] for PCP to monitor per wife she will follow up to make pt has a follow up regarding this.  No other concerns noted

## 2015-04-08 DIAGNOSIS — C3411 Malignant neoplasm of upper lobe, right bronchus or lung: Secondary | ICD-10-CM | POA: Diagnosis not present

## 2015-04-09 DIAGNOSIS — C3411 Malignant neoplasm of upper lobe, right bronchus or lung: Secondary | ICD-10-CM | POA: Diagnosis not present

## 2015-04-10 ENCOUNTER — Ambulatory Visit: Payer: BLUE CROSS/BLUE SHIELD

## 2015-04-12 ENCOUNTER — Other Ambulatory Visit: Payer: Self-pay | Admitting: *Deleted

## 2015-04-12 ENCOUNTER — Encounter
Admission: RE | Admit: 2015-04-12 | Discharge: 2015-04-12 | Disposition: A | Discharge status: Home / Self Care | Payer: BLUE CROSS/BLUE SHIELD | Source: Ambulatory Visit | Attending: Hematology and Oncology | Admitting: Hematology and Oncology

## 2015-04-12 DIAGNOSIS — D649 Anemia, unspecified: Secondary | ICD-10-CM | POA: Insufficient documentation

## 2015-04-12 DIAGNOSIS — Z9221 Personal history of antineoplastic chemotherapy: Secondary | ICD-10-CM | POA: Diagnosis not present

## 2015-04-12 DIAGNOSIS — C772 Secondary and unspecified malignant neoplasm of intra-abdominal lymph nodes: Secondary | ICD-10-CM | POA: Diagnosis not present

## 2015-04-12 DIAGNOSIS — C7972 Secondary malignant neoplasm of left adrenal gland: Secondary | ICD-10-CM | POA: Diagnosis not present

## 2015-04-12 DIAGNOSIS — Z85118 Personal history of other malignant neoplasm of bronchus and lung: Secondary | ICD-10-CM | POA: Diagnosis present

## 2015-04-12 DIAGNOSIS — C7971 Secondary malignant neoplasm of right adrenal gland: Secondary | ICD-10-CM | POA: Diagnosis not present

## 2015-04-12 LAB — OCCULT BLOOD X 1 CARD TO LAB, STOOL
Fecal Occult Bld: NEGATIVE
Fecal Occult Bld: NEGATIVE
Fecal Occult Bld: NEGATIVE

## 2015-04-12 LAB — GLUCOSE, CAPILLARY: Glucose-Capillary: 71 mg/dL (ref 65–99)

## 2015-04-12 MED ORDER — FLUDEOXYGLUCOSE F - 18 (FDG) INJECTION
12.6000 | Freq: Once | INTRAVENOUS | Status: AC | PRN
  Administered 2015-04-12: 12.6 via INTRAVENOUS

## 2015-04-14 ENCOUNTER — Telehealth: Payer: Self-pay

## 2015-04-14 ENCOUNTER — Encounter: Payer: Self-pay | Admitting: Hematology and Oncology

## 2015-04-14 NOTE — Telephone Encounter (Signed)
Returned pt's phone call.  Pt very anxious about PET scan and would like to know results.  At first I wasn't sure it was back then saw we have report.  Explained to pt he has a follow up on Monday and Dr. Mike Gip will review his results with him.  He explained he had a PET a year ago also when he was diagnosed.  I told him I feel more comfortable that Dr. Zenia Resides could review results with him and compare.  He thanked me for calling him and stated he would see [place] Monday.  No other concerns noted.

## 2015-04-17 ENCOUNTER — Inpatient Hospital Stay: Payer: BLUE CROSS/BLUE SHIELD

## 2015-04-17 ENCOUNTER — Inpatient Hospital Stay (HOSPITAL_BASED_OUTPATIENT_CLINIC_OR_DEPARTMENT_OTHER): Payer: BLUE CROSS/BLUE SHIELD | Admitting: Hematology and Oncology

## 2015-04-17 VITALS — BP 121/84 | HR 103 | Temp 96.7°F | Resp 20 | Wt 125.9 lb

## 2015-04-17 DIAGNOSIS — C3411 Malignant neoplasm of upper lobe, right bronchus or lung: Secondary | ICD-10-CM | POA: Diagnosis not present

## 2015-04-17 DIAGNOSIS — C7971 Secondary malignant neoplasm of right adrenal gland: Secondary | ICD-10-CM | POA: Diagnosis not present

## 2015-04-17 DIAGNOSIS — C7972 Secondary malignant neoplasm of left adrenal gland: Secondary | ICD-10-CM | POA: Diagnosis not present

## 2015-04-17 DIAGNOSIS — Z923 Personal history of irradiation: Secondary | ICD-10-CM

## 2015-04-17 DIAGNOSIS — Z79899 Other long term (current) drug therapy: Secondary | ICD-10-CM

## 2015-04-17 DIAGNOSIS — D509 Iron deficiency anemia, unspecified: Secondary | ICD-10-CM

## 2015-04-17 DIAGNOSIS — Z9221 Personal history of antineoplastic chemotherapy: Secondary | ICD-10-CM

## 2015-04-17 DIAGNOSIS — C771 Secondary and unspecified malignant neoplasm of intrathoracic lymph nodes: Secondary | ICD-10-CM

## 2015-04-17 NOTE — Progress Notes (Signed)
[facility] day:  04/17/2015  Chief Complaint: Darryl Hatfield is a 55 y.o. 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  HPI:  The patient was last seen in the medical oncology clinic on 04/05/2015.  At that time, he was seen for initial assessment by me.   Chest CT angiogram on 03/29/2015 performed in the ER for chest pain revealed no evidence of pulmonary embolism.  There was irregular soft tissue thickening around the right hilum which had progressed since 09/28/2014.  Findings were worrisome for recurrent tumor.  He was also noted to have progressive anemia over the past 3 months.  Hematocrit has decreased from 41.7 to 36.3.  His last colonoscopy was just prior to his diagnosis.  He had never had an EGD.  He denied any melena or hematochezia.  Diet was poor.  We discussed obtaining a PET to assess the thickening around the hilum.  PET scan on 04/12/2015 revealed mild soft tissue fullness along the posterior right upper lobe/perihilar region, with associated mild vague hypermetabolism (SUV 2.6).  There were hypermetabolic bilateral adrenal metastases, right (2.7 cm; SUV 10) greater than left (2.1 cm; SUV 7.8).  There was small hypermetabolic upper abdominal nodal metastases (9 mm porta hepatitis (SUV 7.3) and 7 mm portacaval node (SUV 5.3)).  Anemia work-up on 04/05/2015 revealed a ferritin of 48, iron saturation 12%, TIBC 310, ESR 27, B12 255, folate 13.8.  TSH was 8.840 (high) with normal T4 and T3U, and free thyroxine index.  He states that his PCP called a thyroid medication in for him.  He started oral iron.  Symptomatically, he feels alright.  He denies any new complaints.  Past Medical History  Diagnosis Date  . COPD (chronic obstructive pulmonary disease) ([place])   . Lung cancer ([place]) 2015    RIght   . CHF (congestive heart failure) ([place])   . Seizures [facility])     Past Surgical History   Procedure Laterality Date  . Back surgery      Family History  Problem Relation Age of Onset  . Hypertension Other     Social History:  reports that he quit smoking about 21 months ago. He has never used smokeless tobacco. He reports that he does not drink alcohol or use illicit drugs.  The patient is accompanied by his wife, Darryl Hatfield,  today.  Allergies:  Allergies  Allergen Reactions  . No Known Allergies     Current Medications: Current Outpatient Prescriptions  Medication Sig Dispense Refill  . acetaminophen (TYLENOL) 325 MG tablet Take by mouth.    Marland Kitchen albuterol (PROVENTIL) (2.5 MG/3ML) 0.083% nebulizer solution Take 3 mLs (2.5 mg total) by nebulization every 6 (six) hours as needed for wheezing or shortness of breath. 75 mL 2  . buPROPion (WELLBUTRIN SR) 200 MG 12 hr tablet Take 1 tablet (200 mg total) by mouth 2 (two) times daily. 60 tablet 2  . ferrous sulfate 325 (65 FE) MG tablet Take 325 mg by mouth daily with breakfast.    . Fluticasone-Salmeterol (ADVAIR) 250-50 MCG/DOSE AEPB Inhale 1 puff into the lungs 2 (two) times daily. 60 each 2  . gabapentin (NEURONTIN) 100 MG capsule Take 1 capsule (100 mg total) by mouth 3 (three) times daily. 90 capsule 4  . LORazepam (ATIVAN) 0.5 MG tablet Take 1 tablet (0.5 mg total) by mouth every 8 (eight) hours as needed for anxiety. 90 tablet 1  .  QUEtiapine (SEROQUEL) 25 MG tablet Take 1 tablet (25 mg total) by mouth at bedtime. 30 tablet 0  . tiotropium (SPIRIVA) 18 MCG inhalation capsule Place 18 mcg into inhaler and inhale daily.     No current facility-administered medications for this visit.   Facility-Administered Medications Ordered in Other Visits  Medication Dose Route Frequency Provider Last Rate Last Dose  . sodium chloride 0.9 % injection 10 mL  10 mL Intracatheter PRN Forest Gleason, MD   10 mL at 06/27/14 1338  . sodium chloride 0.9 % injection 10 mL  10 mL Intracatheter PRN Leia Alf, MD   10 mL at 08/22/14 0851     Review of Systems:  GENERAL:  Feels alright unless exerts self.  No fevers or sweats.  Maximum weight 135 pounds. PERFORMANCE STATUS (ECOG):  2 HEENT:  No visual changes, runny nose, sore throat, mouth sores or tenderness. Lungs: Shortness of breath with exertion (stable).  Dry cough.  No hemoptysis. Cardiac:  No chest pain, palpitations, orthopnea, or PND. GI:  No nausea, vomiting, diarrhea, constipation, melena or hematochezia.  No prior EGD.  Colonoscopy prior to diagnosis. GU:  No urgency, frequency, dysuria, or hematuria. Musculoskeletal:  No back pain.  No joint pain.  No muscle tenderness. Extremities:  No pain or swelling. Skin:  No rashes or skin changes. Neuro:  No headache, numbness or weakness, balance or coordination issues. Endocrine:  No diabetes.  Recently prescribed thyroid medication.  No hot flashes or night sweats. Psych:  No mood changes, depression or anxiety. Pain:  No focal pain. Review of systems:  All other systems reviewed and found to be negative.  Physical Exam: Blood pressure 121/84, pulse 103, temperature 96.7 F (35.9 C), temperature source Tympanic, resp. rate 20, weight 125 lb 14.1 oz (57.1 kg). GENERAL:  Thin gentleman sitting comfortably in the exam room in no acute distress. MENTAL STATUS:  Alert and oriented to person, place and time. HEAD:  Black hair with slight graying.  Temporal wasting.  Normocephalic, atraumatic, face symmetric, no Cushingoid features. EYES:  Glasses.  Blue eyes.  No conjunctivitis or scleral icterus. NEUROLOGICAL: Unremarkable.  Anxious. PSYCH:  Appropriate.  No visits with results within 3 Day(s) from this visit. Latest known visit with results is:  Hospital Outpatient Visit on 04/12/2015  Component Date Value Ref Range Status  . Glucose-Capillary 04/12/2015 71  65 - 99 mg/dL Final    Assessment:  Darryl Hatfield is a 55 y.o. 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  PET  scan on 06/23/2013 revealed a 1.5 cm right upper lobe nodule (SUV 7.4) concerning for primary bronchogenic carcinoma.  There were hypermetabolic ipsilateral hilar and mediastinal lymph node metastasis. There was malignant range FDG uptake associated with bilateral level 2 cervical lymph nodes of uncertain significance.  EBUS assisted biopsy of a right lower paratracheal and right hilar lymph node on 07/02/2013 confirmed non-small cell carcinoma, favoring squamous cell carcinoma.  Clinical stage was T1N2M0.  He received concurrent chemotherapy and radiation.  He received 6,000 cGy from 08/05/2014 - 09/14/2013.  He received weekly carboplatin and Taxol x 6 from 08/02/2013 - 10/25/2013.  Course was complicated by a late pneumothorax secondary to port placement.  He also had a seizure in 03/2014 with subsequent fracture of lumbar vertebrae and rod placement.  PET scan on 06/01/2014 revealed no evidence of residual hypermetabolic disease within the chest.  There was focal bilateral activity in the upper neck reflecting underlying bilateral  parotid lesions (benign).  Chest CT angiogram on 03/29/2015 revealed no evidence of pulmonary embolism.  There was irregular soft tissue thickening around the right hilum which had progressed since 09/28/2014.  Findings were worrisome for recurrent tumor.  PET scan on 04/12/2015 revealed mild soft tissue fullness along the posterior right upper lobe/perihilar region, with associated mild vague hypermetabolism (SUV 2.6).  There were hypermetabolic bilateral adrenal metastases, right (2.7 cm; SUV 10) greater than left (2.1 cm; SUV 7.8).  There was small hypermetabolic upper abdominal nodal metastases (9 mm porta hepatitis (SUV 7.3) and 7 mm portacaval node (SUV 5.3)).  He has developed progressive anemia over the past 3 months.  Hematocrit has decreased from 41.7 to 36.3.  His last colonoscopy was just prior to his diagnosis.  He has never had an EGD.  He denies any melena  or hematochezia.  Diet is poor.  Anemia work-up on 04/05/2015 revealed iron deficiency anemia (ferritin48, iron saturation 12%, TIBC 310).  Sedimentation rate was 27.  B12 was 255 (low normal) with a folate 13.8 (normal).  TSH was 8.840 (high) with normal T4 and T3U, and free thyroxine index.  He is on oral iron.  Symptomatically, he notes shortness of breath with exertion (stable).  He has a little dry cough and chest discomfort.   Plan: 1.  Review PET scan.  PET scan suggests recurrent disease.  Discuss presentation at tumor board.  Discuss biopsy to confirm recurrence.  Discuss bronchoscopy or CT guided adrenal biopsy.  Anticipate adrenal biopsy.  Patient wishes to pursue. 2.  Discuss anemia work-up.  Discuss iron deficiency.  Patient on oral iron.  Discuss thyroid studies.  Discuss low normal B12 and MMA today to confirm possible B12 deficiency. 3.  Labs today:  MMA. 4.  Present at tumor board on 04/19/2015. 5.  Contact patient with tumor board discussion and biopsy plan. 6.  RTC after biopsy to discuss results and direction of therapy.  Addendum:  Patient discussed at tumor board.  Decision to perform right adrenal biopsy.  Patient notified.  Biopsy scheduled.  Labs prior to procedure.   Lequita Asal, MD   04/17/2015

## 2015-04-17 NOTE — Progress Notes (Signed)
Patient here today for PET results, follow up regarding lung cancer. Patient reports intermittent cough, has started taking Synthroid but is not sure of dosage.

## 2015-04-20 ENCOUNTER — Other Ambulatory Visit: Payer: Self-pay | Admitting: Hematology and Oncology

## 2015-04-20 DIAGNOSIS — C3411 Malignant neoplasm of upper lobe, right bronchus or lung: Secondary | ICD-10-CM

## 2015-04-20 LAB — METHYLMALONIC ACID, SERUM: Methylmalonic Acid, Quantitative: 246 nmol/L (ref 0–378)

## 2015-04-21 ENCOUNTER — Other Ambulatory Visit: Payer: Self-pay | Admitting: *Deleted

## 2015-04-21 ENCOUNTER — Telehealth: Payer: Self-pay | Admitting: *Deleted

## 2015-04-21 DIAGNOSIS — C3411 Malignant neoplasm of upper lobe, right bronchus or lung: Secondary | ICD-10-CM

## 2015-04-21 NOTE — Telephone Encounter (Signed)
Called pt and let him know that his case was discussed in case conference and it was decided to proceed with bx. And the best place was right adrenal gland.. It has been sch. For 4/4 arrive at 10 am and procedure at 11.  NPO after midnight. Any meds with sip of water only.  Come get labs done 4/3 and pt said 9:30 would be ok with him.  He will call his wife and let her know and call me back if there are any problems with the above

## 2015-04-21 NOTE — Telephone Encounter (Signed)
Askingwhat was decided at meeting yesterday regarding a biopsy

## 2015-04-21 NOTE — Telephone Encounter (Signed)
I got a call from wife and she has all the info and she and I agree as well as Dr. Mike Gip that all the labs will be done on 4/3 and we will cancel the labs and flush for 4/7, also cancel the old ct scan for 4/7 and then come to see md 4/10 will keep and will add port flush at that time.

## 2015-04-23 ENCOUNTER — Encounter: Payer: Self-pay | Admitting: Hematology and Oncology

## 2015-04-24 ENCOUNTER — Inpatient Hospital Stay: Payer: BLUE CROSS/BLUE SHIELD | Attending: Hematology and Oncology

## 2015-04-24 ENCOUNTER — Other Ambulatory Visit: Payer: Self-pay | Admitting: General Surgery

## 2015-04-24 DIAGNOSIS — C7972 Secondary malignant neoplasm of left adrenal gland: Secondary | ICD-10-CM | POA: Diagnosis not present

## 2015-04-24 DIAGNOSIS — Z9221 Personal history of antineoplastic chemotherapy: Secondary | ICD-10-CM | POA: Diagnosis not present

## 2015-04-24 DIAGNOSIS — C7971 Secondary malignant neoplasm of right adrenal gland: Secondary | ICD-10-CM | POA: Diagnosis not present

## 2015-04-24 DIAGNOSIS — E039 Hypothyroidism, unspecified: Secondary | ICD-10-CM | POA: Diagnosis not present

## 2015-04-24 DIAGNOSIS — Z452 Encounter for adjustment and management of vascular access device: Secondary | ICD-10-CM | POA: Insufficient documentation

## 2015-04-24 DIAGNOSIS — Z79899 Other long term (current) drug therapy: Secondary | ICD-10-CM | POA: Insufficient documentation

## 2015-04-24 DIAGNOSIS — Z923 Personal history of irradiation: Secondary | ICD-10-CM | POA: Insufficient documentation

## 2015-04-24 DIAGNOSIS — Z87891 Personal history of nicotine dependence: Secondary | ICD-10-CM | POA: Diagnosis not present

## 2015-04-24 DIAGNOSIS — R569 Unspecified convulsions: Secondary | ICD-10-CM | POA: Diagnosis not present

## 2015-04-24 DIAGNOSIS — C771 Secondary and unspecified malignant neoplasm of intrathoracic lymph nodes: Secondary | ICD-10-CM | POA: Insufficient documentation

## 2015-04-24 DIAGNOSIS — C7800 Secondary malignant neoplasm of unspecified lung: Secondary | ICD-10-CM | POA: Insufficient documentation

## 2015-04-24 DIAGNOSIS — C3411 Malignant neoplasm of upper lobe, right bronchus or lung: Secondary | ICD-10-CM | POA: Insufficient documentation

## 2015-04-24 DIAGNOSIS — I509 Heart failure, unspecified: Secondary | ICD-10-CM | POA: Insufficient documentation

## 2015-04-24 DIAGNOSIS — J449 Chronic obstructive pulmonary disease, unspecified: Secondary | ICD-10-CM | POA: Diagnosis not present

## 2015-04-24 DIAGNOSIS — D509 Iron deficiency anemia, unspecified: Secondary | ICD-10-CM | POA: Diagnosis not present

## 2015-04-24 DIAGNOSIS — C3491 Malignant neoplasm of unspecified part of right bronchus or lung: Secondary | ICD-10-CM

## 2015-04-24 DIAGNOSIS — F419 Anxiety disorder, unspecified: Secondary | ICD-10-CM | POA: Insufficient documentation

## 2015-04-24 LAB — COMPREHENSIVE METABOLIC PANEL
ALK PHOS: 89 U/L (ref 38–126)
ALT: 25 U/L (ref 17–63)
AST: 24 U/L (ref 15–41)
Albumin: 4.2 g/dL (ref 3.5–5.0)
Anion gap: 6 (ref 5–15)
BILIRUBIN TOTAL: 0.4 mg/dL (ref 0.3–1.2)
BUN: 18 mg/dL (ref 6–20)
CALCIUM: 9.3 mg/dL (ref 8.9–10.3)
CO2: 28 mmol/L (ref 22–32)
Chloride: 101 mmol/L (ref 101–111)
Creatinine, Ser: 0.96 mg/dL (ref 0.61–1.24)
GFR calc Af Amer: >60 mL/min (ref >60)
GFR calc non Af Amer: >60 mL/min (ref >60)
GLUCOSE: 87 mg/dL (ref 65–99)
Potassium: 4.1 mmol/L (ref 3.5–5.1)
Sodium: 135 mmol/L (ref 135–145)
TOTAL PROTEIN: 8.2 g/dL — AB (ref 6.5–8.1)

## 2015-04-24 LAB — CBC WITH DIFFERENTIAL/PLATELET
Basophils Absolute: 0.1 10*3/uL (ref 0–0.1)
Basophils Relative: 1 %
Eosinophils Absolute: 0.2 10*3/uL (ref 0–0.7)
Eosinophils Relative: 4 %
HCT: 42.1 % (ref 40.0–52.0)
Hemoglobin: 14.2 g/dL (ref 13.0–18.0)
Lymphocytes Relative: 20 %
Lymphs Abs: 1 10*3/uL (ref 1.0–3.6)
MCH: 28.6 pg (ref 26.0–34.0)
MCHC: 33.7 g/dL (ref 32.0–36.0)
MCV: 84.8 fL (ref 80.0–100.0)
Monocytes Absolute: 0.6 10*3/uL (ref 0.2–1.0)
Monocytes Relative: 13 %
Neutro Abs: 3.1 10*3/uL (ref 1.4–6.5)
Neutrophils Relative %: 62 %
Platelets: 216 10*3/uL (ref 150–440)
RBC: 4.96 MIL/uL (ref 4.40–5.90)
RDW: 15.1 % — ABNORMAL HIGH (ref 11.5–14.5)
WBC: 5 10*3/uL (ref 3.8–10.6)

## 2015-04-24 LAB — APTT: aPTT: 34 seconds (ref 24–36)

## 2015-04-24 LAB — PROTIME-INR
INR: 1.07
Prothrombin Time: 14.1 seconds (ref 11.4–15.0)

## 2015-04-25 ENCOUNTER — Ambulatory Visit
Admission: RE | Admit: 2015-04-25 | Discharge: 2015-04-25 | Disposition: A | Discharge status: Home / Self Care | Payer: BLUE CROSS/BLUE SHIELD | Source: Ambulatory Visit | Attending: Hematology and Oncology | Admitting: Hematology and Oncology

## 2015-04-25 DIAGNOSIS — C3490 Malignant neoplasm of unspecified part of unspecified bronchus or lung: Secondary | ICD-10-CM | POA: Insufficient documentation

## 2015-04-25 DIAGNOSIS — C3411 Malignant neoplasm of upper lobe, right bronchus or lung: Secondary | ICD-10-CM | POA: Diagnosis present

## 2015-04-25 DIAGNOSIS — C7970 Secondary malignant neoplasm of unspecified adrenal gland: Secondary | ICD-10-CM | POA: Diagnosis not present

## 2015-04-25 HISTORY — DX: Hypothyroidism, unspecified: E03.9

## 2015-04-25 MED ORDER — HEPARIN SOD (PORK) LOCK FLUSH 10 UNIT/ML IV SOLN
INTRAVENOUS | Status: AC
  Filled 2015-04-25: qty 1

## 2015-04-25 MED ORDER — HYDROCODONE-ACETAMINOPHEN 5-325 MG PO TABS
ORAL_TABLET | ORAL | Status: AC
  Filled 2015-04-25: qty 1

## 2015-04-25 MED ORDER — SODIUM CHLORIDE 0.9 % IV SOLN
INTRAVENOUS | Status: DC
  Administered 2015-04-25: 11:00:00 via INTRAVENOUS

## 2015-04-25 MED ORDER — HYDROCODONE-ACETAMINOPHEN 5-325 MG PO TABS
1.0000 | ORAL_TABLET | ORAL | Status: DC | PRN
  Administered 2015-04-25: 1 via ORAL
  Filled 2015-04-25: qty 2

## 2015-04-25 NOTE — Procedures (Signed)
CT right adrenal biopsy  Complications:  None  Blood Loss: none  See dictation in canopy pacs

## 2015-04-26 ENCOUNTER — Telehealth: Payer: Self-pay | Admitting: *Deleted

## 2015-04-26 NOTE — Telephone Encounter (Signed)
Asking if insurance/ loan company paperwork has been completed and faxed. States needs to be done ASAP

## 2015-04-27 ENCOUNTER — Telehealth: Payer: Self-pay

## 2015-04-27 NOTE — Telephone Encounter (Signed)
Called and spoke with pt about disability form left here.  Per Dr. Mike Gip she would like to wait to complete until biopsy is back.  Pt verbalized an understanding.  No other concerns noted

## 2015-04-27 NOTE — Telephone Encounter (Signed)
Per Lula, she has the papers, but they have not been completed yet.Left message on VM for [facility]

## 2015-04-28 ENCOUNTER — Ambulatory Visit: Payer: BLUE CROSS/BLUE SHIELD

## 2015-04-28 ENCOUNTER — Other Ambulatory Visit: Payer: BLUE CROSS/BLUE SHIELD

## 2015-05-01 ENCOUNTER — Encounter: Payer: Self-pay | Admitting: Hematology and Oncology

## 2015-05-01 ENCOUNTER — Inpatient Hospital Stay: Payer: BLUE CROSS/BLUE SHIELD

## 2015-05-01 ENCOUNTER — Inpatient Hospital Stay (HOSPITAL_BASED_OUTPATIENT_CLINIC_OR_DEPARTMENT_OTHER): Payer: BLUE CROSS/BLUE SHIELD | Admitting: Hematology and Oncology

## 2015-05-01 VITALS — BP 127/90 | HR 97 | Temp 95.3°F | Resp 18 | Wt 122.1 lb

## 2015-05-01 DIAGNOSIS — C3411 Malignant neoplasm of upper lobe, right bronchus or lung: Secondary | ICD-10-CM | POA: Diagnosis not present

## 2015-05-01 DIAGNOSIS — C7971 Secondary malignant neoplasm of right adrenal gland: Secondary | ICD-10-CM | POA: Diagnosis not present

## 2015-05-01 DIAGNOSIS — C771 Secondary and unspecified malignant neoplasm of intrathoracic lymph nodes: Secondary | ICD-10-CM | POA: Diagnosis not present

## 2015-05-01 DIAGNOSIS — J449 Chronic obstructive pulmonary disease, unspecified: Secondary | ICD-10-CM

## 2015-05-01 DIAGNOSIS — Z923 Personal history of irradiation: Secondary | ICD-10-CM

## 2015-05-01 DIAGNOSIS — E039 Hypothyroidism, unspecified: Secondary | ICD-10-CM

## 2015-05-01 DIAGNOSIS — C7972 Secondary malignant neoplasm of left adrenal gland: Secondary | ICD-10-CM | POA: Diagnosis not present

## 2015-05-01 DIAGNOSIS — C801 Malignant (primary) neoplasm, unspecified: Secondary | ICD-10-CM

## 2015-05-01 DIAGNOSIS — D509 Iron deficiency anemia, unspecified: Secondary | ICD-10-CM

## 2015-05-01 DIAGNOSIS — Z79899 Other long term (current) drug therapy: Secondary | ICD-10-CM

## 2015-05-01 DIAGNOSIS — C7970 Secondary malignant neoplasm of unspecified adrenal gland: Secondary | ICD-10-CM

## 2015-05-01 DIAGNOSIS — Z87891 Personal history of nicotine dependence: Secondary | ICD-10-CM

## 2015-05-01 DIAGNOSIS — R569 Unspecified convulsions: Secondary | ICD-10-CM

## 2015-05-01 DIAGNOSIS — Z9221 Personal history of antineoplastic chemotherapy: Secondary | ICD-10-CM

## 2015-05-01 DIAGNOSIS — I509 Heart failure, unspecified: Secondary | ICD-10-CM

## 2015-05-01 MED ORDER — HEPARIN SOD (PORK) LOCK FLUSH 100 UNIT/ML IV SOLN
INTRAVENOUS | Status: AC
  Filled 2015-05-01: qty 5

## 2015-05-01 MED ORDER — SODIUM CHLORIDE 0.9% FLUSH
10.0000 mL | INTRAVENOUS | Status: DC | PRN
  Administered 2015-05-01: 10 mL via INTRAVENOUS
  Filled 2015-05-01: qty 10

## 2015-05-01 MED ORDER — HEPARIN SOD (PORK) LOCK FLUSH 100 UNIT/ML IV SOLN
500.0000 [IU] | Freq: Once | INTRAVENOUS | Status: AC
Start: 2015-05-01 — End: 2015-05-01
  Administered 2015-05-01: 500 [IU] via INTRAVENOUS

## 2015-05-01 NOTE — Progress Notes (Signed)
[place] [facility] day:  05/01/2015   Chief Complaint: Darryl Hatfield is a 55 y.o. 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  HPI:  The patient was last seen in the medical oncology clinic on 04/17/2015.  At that time, PET scan was reviewed.  PET scan suggested metastatic disease with bilateral lesions, soft tissue fullness along the posterior right upper lobe, and a small hypermetabolic abdominal node.    He subsequently underwent CT guided right adrenal biopsy on 04/25/2015.  Pathology revealed metastatic adenocarcinoma.  TTF1 was positive.  Symptomatically, he notes a pressure sensation in his lower back.  He has occasional shortness of breath.   Past Medical History  Diagnosis Date  . COPD (chronic obstructive pulmonary disease) ([place])   . Lung cancer ([place]) 2015    RIght   . CHF (congestive heart failure) ([place])   . Seizures ([place])   . Hypothyroidism     Past Surgical History  Procedure Laterality Date  . Back surgery    . Portacath placement Right     Family History  Problem Relation Age of Onset  . Hypertension Other     Social History:  reports that he quit smoking about 22 months ago. He has never used smokeless tobacco. He reports that he does not drink alcohol or use illicit drugs.  He is on disability.  The patient is accompanied by his wife, [name],  today.  Allergies:  Allergies  Allergen Reactions  . No Known Allergies     Current Medications: Current Outpatient Prescriptions  Medication Sig Dispense Refill  . acetaminophen (TYLENOL) 325 MG tablet Take by mouth.    Marland Kitchen albuterol (PROVENTIL) (2.5 MG/3ML) 0.083% nebulizer solution Take 3 mLs (2.5 mg total) by nebulization every 6 (six) hours as needed for wheezing or shortness of breath. 75 mL 2  . buPROPion (WELLBUTRIN SR) 200 MG 12 hr  tablet Take 1 tablet (200 mg total) by mouth 2 (two) times daily. 60 tablet 2  . ferrous sulfate 325 (65 FE) MG tablet Take 325 mg by mouth daily with breakfast.    . Fluticasone-Salmeterol (ADVAIR) 250-50 MCG/DOSE AEPB Inhale 1 puff into the lungs 2 (two) times daily. 60 each 2  . gabapentin (NEURONTIN) 100 MG capsule Take 1 capsule (100 mg total) by mouth 3 (three) times daily. 90 capsule 4  . levothyroxine (SYNTHROID, LEVOTHROID) 88 MCG tablet Take 88 mcg by mouth daily before breakfast.    . LORazepam (ATIVAN) 0.5 MG tablet Take 1 tablet (0.5 mg total) by mouth every 8 (eight) hours as needed for anxiety. 90 tablet 1  . QUEtiapine (SEROQUEL) 25 MG tablet Take 1 tablet (25 mg total) by mouth at bedtime. 30 tablet 0  . tiotropium (SPIRIVA) 18 MCG inhalation capsule Place 18 mcg into inhaler and inhale daily.     No current facility-administered medications for this visit.   Facility-Administered Medications Ordered in Other Visits  Medication Dose Route Frequency Provider Last Rate Last Dose  . 0.9 %  sodium chloride infusion   Intravenous Continuous Saverio Danker, PA-C 10 mL/hr at 04/25/15 1045    . HYDROcodone-acetaminophen (NORCO/VICODIN) 5-325 MG per tablet 1-2 tablet  1-2 tablet Oral Q4H PRN Inez Catalina, MD   1 tablet at 04/25/15 1231  . sodium chloride 0.9 % injection 10 mL  10 mL Intracatheter PRN Delorise Shiner  Choksi, MD   10 mL at 06/27/14 1338  . sodium chloride 0.9 % injection 10 mL  10 mL Intracatheter PRN Leia Alf, MD   10 mL at 08/22/14 0851    Review of Systems:  GENERAL:  Feels "ok".  No fevers or sweats.  Weight down 3 pounds.  Maximum weight 135 pounds. PERFORMANCE STATUS (ECOG):  2 HEENT:  No visual changes, runny nose, sore throat, mouth sores or tenderness. Lungs: Shortness of breath with exertion (stable).  Dry cough.  No hemoptysis. Cardiac:  No chest pain, palpitations, orthopnea, or PND. GI:  No nausea, vomiting, diarrhea, constipation, melena or hematochezia.  No  prior EGD.  Colonoscopy prior to diagnosis. GU:  No urgency, frequency, dysuria, or hematuria. Musculoskeletal:  Chronic back pain/pressure.  No joint pain.  No muscle tenderness. Extremities:  No pain or swelling. Skin:  No rashes or skin changes. Neuro:  No headache, numbness or weakness, balance or coordination issues. Endocrine:  No diabetes. On thyroid medication.  No hot flashes or night sweats. Psych:  No mood changes, depression or anxiety. Pain:  No focal pain. Review of systems:  All other systems reviewed and found to be negative.  Physical Exam: Blood pressure 127/90, pulse 97, temperature 95.3 F (35.2 C), temperature source Tympanic, resp. rate 18, weight 122 lb 2.2 oz (55.4 kg). GENERAL:  Thin gentleman sitting comfortably in the exam room in no acute distress. MENTAL STATUS:  Alert and oriented to person, place and time. HEAD:  Black hair with slight graying.  Temporal wasting.  Normocephalic, atraumatic, face symmetric, no Cushingoid features. EYES:  Glasses.  Blue eyes.  No conjunctivitis or scleral icterus. NEUROLOGICAL: Unremarkable.  Anxious. PSYCH:  Appropriate.  No visits with results within 3 Day(s) from this visit. Latest known visit with results is:  Hospital Outpatient Visit on 04/25/2015  Component Date Value Ref Range Status  . SURGICAL PATHOLOGY 04/25/2015    Final                   Value:Surgical Pathology CASE: [phone] PATIENT: Margarita Rana Surgical Pathology Report     SPECIMEN SUBMITTED: A. Adrenal gland, right  CLINICAL HISTORY: Known lung CA, likely METS  PRE-OPERATIVE DIAGNOSIS: None provided  POST-OPERATIVE DIAGNOSIS: None provided.     DIAGNOSIS: A. RIGHT ADRENAL GLAND; CORE BIOPSY: - METASTATIC ADENOCARCINOMA, SEE COMMENT.  Comment: The clinical history is noted. A limited panel of immunohistochemical stains was performed for further characterization. TTF-1 is positive, which supports lung origin. There is focal  non-diffuse p40 immunoreactivity without squamous differentiation, a non-specific finding. Intradepartmental consultation was obtained. The control slides stained appropriately.   GROSS DESCRIPTION:  A. Labeled: right adrenal biopsy  Tissue fragment(s): 5 cores  Size: 1.3-2 cm in length and in diameter 0.1 cm  Description: tan to brown cores, wrapped in lens paper marked orange and submitted a mesh ba                         g  Entirely submitted in 1-2 cassette(s).    Final Diagnosis performed by Delorse Lek, MD.  Electronically signed 04/27/2015 4:00:20PM    The electronic signature indicates that the named Attending Pathologist has evaluated the specimen  Technical component performed at [facility], [address] [place], [place] [address] Lab: [phone] Dir: Darrick Penna. Evette Doffing, MD  Professional component performed at [facility], [facility], [address], [place], [place], [place] [address] Lab: [phone] Dir: Dellia Nims. Reuel Derby, MD  Assessment:  ADREYAN CARBAJAL is a 55 y.o. 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  PET scan on 06/23/2013 revealed a 1.5 cm right upper lobe nodule (SUV 7.4) concerning for primary bronchogenic carcinoma.  There were hypermetabolic ipsilateral hilar and mediastinal lymph node metastasis. There was malignant range FDG uptake associated with bilateral level 2 cervical lymph nodes of uncertain significance.  EBUS assisted biopsy of a right lower paratracheal and right hilar lymph node on 07/02/2013 confirmed non-small cell carcinoma, favoring squamous cell carcinoma.  Clinical stage was T1N2M0.  He received concurrent chemotherapy and radiation.  He received 6,000 cGy from 08/05/2014 - 09/14/2013.  He received weekly carboplatin and Taxol x 6 from 08/02/2013 - 10/25/2013.  Course was complicated by a late pneumothorax secondary to port placement.  He also had a seizure in 03/2014 with  subsequent fracture of lumbar vertebrae and rod placement.  PET scan on 06/01/2014 revealed no evidence of residual hypermetabolic disease within the chest.  There was focal bilateral activity in the upper neck reflecting underlying bilateral parotid lesions (benign).  Chest CT angiogram on 03/29/2015 revealed no evidence of pulmonary embolism.  There was irregular soft tissue thickening around the right hilum which had progressed since 09/28/2014.  Findings were worrisome for recurrent tumor.  PET scan on 04/12/2015 revealed mild soft tissue fullness along the posterior right upper lobe/perihilar region, with associated mild vague hypermetabolism (SUV 2.6).  There were hypermetabolic bilateral adrenal metastases, right (2.7 cm; SUV 10) greater than left (2.1 cm; SUV 7.8).  There was small hypermetabolic upper abdominal nodal metastases (9 mm porta hepatitis (SUV 7.3) and 7 mm portacaval node (SUV 5.3)).  He has developed progressive anemia over the past 3 months.  Hematocrit has decreased from 41.7 to 36.3.  His last colonoscopy was just prior to his diagnosis.  He has never had an EGD.  He denies any melena or hematochezia.  Diet is poor.  Anemia work-up on 04/05/2015 revealed iron deficiency anemia (ferritin 48, iron saturation 12%, TIBC 310).  Sedimentation rate was 27.  B12 was 255 (low normal) with a folate 13.8 (normal).  MMA was normal (246).  TSH was 8.840 (high) with normal T4 and T3U, and free thyroxine index.  He is on oral iron.  Symptomatically, he notes shortness of breath with exertion (stable).  He has a little dry cough and chest discomfort.   Plan: 1.  Review CT guided adrenal biopsy.  Discuss confirmation of metastatic disease.  Discuss chemotherapy (Alimta + carboplatin) versus targeted therapy.  Discuss awaiting molecular markers (EGFR, ALK, ROS1, PDL-1).  Discuss side effects associated with therapy. 2.  Complete paper work for [employer]. 3.  Continue oral iron. 4.   RTC on 05/12/2015 for review of molecular testing and discussion regarding direction of therapy.   Lequita Asal, MD  05/01/2015

## 2015-05-01 NOTE — Progress Notes (Signed)
States is having lower back pain that feels like pressure on his kidney's. Also states has chills at home but wife states because it has been cold in the house.

## 2015-05-02 ENCOUNTER — Telehealth: Payer: Self-pay | Admitting: *Deleted

## 2015-05-02 ENCOUNTER — Encounter: Payer: Self-pay | Admitting: Hematology and Oncology

## 2015-05-02 NOTE — Telephone Encounter (Addendum)
Called to request that we order some "nerve pills" for him, states he takes anxiety pills so he doesn't studder (Ativan 0.'5mg'$  tid), but since dx yesterday he feels he needs something more. Also requests that Elease Etienne -MSW help pay for it

## 2015-05-02 NOTE — Telephone Encounter (Signed)
Per Dr Mike Gip, pt to call his PCP regarding this. Patient informed, and said he will call PCP

## 2015-05-12 ENCOUNTER — Inpatient Hospital Stay (HOSPITAL_BASED_OUTPATIENT_CLINIC_OR_DEPARTMENT_OTHER): Payer: BLUE CROSS/BLUE SHIELD | Admitting: Hematology and Oncology

## 2015-05-12 VITALS — BP 116/83 | HR 108 | Temp 96.8°F | Resp 19 | Ht 68.0 in | Wt 120.5 lb

## 2015-05-12 DIAGNOSIS — C3411 Malignant neoplasm of upper lobe, right bronchus or lung: Secondary | ICD-10-CM | POA: Diagnosis not present

## 2015-05-12 DIAGNOSIS — C7972 Secondary malignant neoplasm of left adrenal gland: Secondary | ICD-10-CM

## 2015-05-12 DIAGNOSIS — D509 Iron deficiency anemia, unspecified: Secondary | ICD-10-CM

## 2015-05-12 DIAGNOSIS — Z923 Personal history of irradiation: Secondary | ICD-10-CM

## 2015-05-12 DIAGNOSIS — Z79899 Other long term (current) drug therapy: Secondary | ICD-10-CM

## 2015-05-12 DIAGNOSIS — C771 Secondary and unspecified malignant neoplasm of intrathoracic lymph nodes: Secondary | ICD-10-CM

## 2015-05-12 DIAGNOSIS — Z87891 Personal history of nicotine dependence: Secondary | ICD-10-CM

## 2015-05-12 DIAGNOSIS — C7971 Secondary malignant neoplasm of right adrenal gland: Secondary | ICD-10-CM

## 2015-05-12 DIAGNOSIS — F419 Anxiety disorder, unspecified: Secondary | ICD-10-CM

## 2015-05-12 DIAGNOSIS — R634 Abnormal weight loss: Secondary | ICD-10-CM | POA: Insufficient documentation

## 2015-05-12 DIAGNOSIS — Z9221 Personal history of antineoplastic chemotherapy: Secondary | ICD-10-CM

## 2015-05-12 DIAGNOSIS — C3491 Malignant neoplasm of unspecified part of right bronchus or lung: Secondary | ICD-10-CM

## 2015-05-12 DIAGNOSIS — C7970 Secondary malignant neoplasm of unspecified adrenal gland: Secondary | ICD-10-CM

## 2015-05-12 MED ORDER — LORAZEPAM 0.5 MG PO TABS: 0.5000 mg | ORAL_TABLET | Freq: Three times a day (TID) | ORAL | Status: DC | PRN

## 2015-05-12 NOTE — Progress Notes (Signed)
[facility] day:  05/12/2015   Chief Complaint: Darryl Hatfield is a 55 y.o. 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  HPI:  The patient was last seen in the medical oncology clinic on 05/01/2015.  At that time, results from his adrenal biopsy were reviewed.  He was noted to have metastatic adenocarcinoma of the lung.  We discussed treatment with standard chemotherapy (carboplatin and Alimta) versus treatment based on molecular markers.  During the interim, he notes a high level of anxiety.  Prior to diagnosis, he had issues with anxiety.  He feels that he needs to start treatment right away.  Symptomatically, he notes occasional shortness of breath.  Appetite is 75% normal.   Past Medical History  Diagnosis Date  . COPD (chronic obstructive pulmonary disease) ([place])   . Lung cancer ([place]) 2015    RIght   . CHF (congestive heart failure) ([place])   . Seizures ([place])   . Hypothyroidism     Past Surgical History  Procedure Laterality Date  . Back surgery    . Portacath placement Right     Family History  Problem Relation Age of Onset  . Hypertension Other     Social History:  reports that he quit smoking about 22 months ago. He has never used smokeless tobacco. He reports that he does not drink alcohol or use illicit drugs.  He is on disability.  He is going to a pig picking.  The patient is accompanied by his wife, [name],  today.  Allergies:  Allergies  Allergen Reactions  . No Known Allergies     Current Medications: Current Outpatient Prescriptions  Medication Sig Dispense Refill  . acetaminophen (TYLENOL) 325 MG tablet Take by mouth.    Marland Kitchen albuterol (PROVENTIL) (2.5 MG/3ML) 0.083% nebulizer solution Take 3 mLs (2.5 mg total) by nebulization every 6 (six) hours as needed for wheezing or shortness of breath. 75 mL 2  . buPROPion (WELLBUTRIN  SR) 200 MG 12 hr tablet Take 1 tablet (200 mg total) by mouth 2 (two) times daily. 60 tablet 2  . ferrous sulfate 325 (65 FE) MG tablet Take 325 mg by mouth daily with breakfast.    . Fluticasone-Salmeterol (ADVAIR) 250-50 MCG/DOSE AEPB Inhale 1 puff into the lungs 2 (two) times daily. 60 each 2  . gabapentin (NEURONTIN) 100 MG capsule Take 1 capsule (100 mg total) by mouth 3 (three) times daily. 90 capsule 4  . levothyroxine (SYNTHROID, LEVOTHROID) 88 MCG tablet Take 88 mcg by mouth daily before breakfast.    . LORazepam (ATIVAN) 0.5 MG tablet Take 1 tablet (0.5 mg total) by mouth every 8 (eight) hours as needed for anxiety. 90 tablet 1  . QUEtiapine (SEROQUEL) 25 MG tablet Take 1 tablet (25 mg total) by mouth at bedtime. 30 tablet 0  . tiotropium (SPIRIVA) 18 MCG inhalation capsule Place 18 mcg into inhaler and inhale daily.     No current facility-administered medications for this visit.   Facility-Administered Medications Ordered in Other Visits  Medication Dose Route Frequency Provider Last Rate Last Dose  . sodium chloride 0.9 % injection 10 mL  10 mL Intracatheter PRN Forest Gleason, MD   10 mL at 06/27/14 1338  . sodium chloride 0.9 % injection 10 mL  10 mL Intracatheter PRN Leia Alf, MD   10 mL at 08/22/14 [phone]  Review of Systems:  GENERAL:  Feels "ok".  No fevers or sweats.  Weight down 2 pounds.  Maximum weight 135 pounds. PERFORMANCE STATUS (ECOG):  2 HEENT:  No visual changes, runny nose, sore throat, mouth sores or tenderness. Lungs: Shortness of breath with exertion (stable).  No cough.  No hemoptysis. Cardiac:  No chest pain, palpitations, orthopnea, or PND. GI:  No nausea, vomiting, diarrhea, constipation, melena or hematochezia.  No prior EGD.  Colonoscopy prior to diagnosis. GU:  No urgency, frequency, dysuria, or hematuria. Musculoskeletal:  Chronic back pain.  No joint pain.  No muscle tenderness. Extremities:  No pain or swelling. Skin:  No rashes or skin  changes. Neuro:  No headache, numbness or weakness, balance or coordination issues. Endocrine:  No diabetes. On thyroid medication.  No hot flashes or night sweats. Psych:  Anxiety.  No mood changes or depression. Pain:  No focal pain. Review of systems:  All other systems reviewed and found to be negative.  Physical Exam: Blood pressure 116/83, pulse 108, temperature 96.8 F (36 C), temperature source Tympanic, resp. rate 19, height '5\' 8"'$  (1.727 m), weight 120 lb 7.7 oz (54.65 kg). GENERAL:  Thin gentleman sitting comfortably in the exam room in no acute distress. MENTAL STATUS:  Alert and oriented to person, place and time. HEAD:  Black hair with slight graying.  Temporal wasting.  Normocephalic, atraumatic, face symmetric, no Cushingoid features. EYES:  Glasses.  Blue eyes.  No conjunctivitis or scleral icterus. NEUROLOGICAL: Anxious. PSYCH:  Appropriate.  No visits with results within 3 Day(s) from this visit. Latest known visit with results is:  Hospital Outpatient Visit on 04/25/2015  Component Date Value Ref Range Status  . SURGICAL PATHOLOGY 04/25/2015    Final                   Value:Surgical Pathology CASE: [id number] PATIENT: Margarita Rana Surgical Pathology Report     SPECIMEN SUBMITTED: A. Adrenal gland, right  CLINICAL HISTORY: Known lung CA, likely METS  PRE-OPERATIVE DIAGNOSIS: None provided  POST-OPERATIVE DIAGNOSIS: None provided.     DIAGNOSIS: A. RIGHT ADRENAL GLAND; CORE BIOPSY: - METASTATIC ADENOCARCINOMA, SEE COMMENT.  Comment: The clinical history is noted. A limited panel of immunohistochemical stains was performed for further characterization. TTF-1 is positive, which supports lung origin. There is focal non-diffuse p40 immunoreactivity without squamous differentiation, a non-specific finding. Intradepartmental consultation was obtained. The control slides stained appropriately.   GROSS DESCRIPTION:  A. Labeled: right adrenal  biopsy  Tissue fragment(s): 5 cores  Size: 1.3-2 cm in length and in diameter 0.1 cm  Description: tan to brown cores, wrapped in lens paper marked orange and submitted a mesh ba                         g  Entirely submitted in 1-2 cassette(s).    Final Diagnosis performed by Delorse Lek, MD.  Electronically signed 04/27/2015 4:00:20PM    The electronic signature indicates that the named Attending Pathologist has evaluated the specimen  Technical component performed at [facility], [address], [place], [place] [address] Lab: [phone] Dir: Darrick Penna. Evette Doffing, MD  Professional component performed at [facility], [facility], [place], [place], [place] [address] Lab: [phone] Dir: Dellia Nims. Rubinas, MD      Assessment:  KENZO OZMENT is a 55 y.o. 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  He was diagnosed with stage IIIA squamous cell lung cancer in 07/02/2013.  PET scan on 06/23/2013 revealed a 1.5 cm right upper lobe nodule (SUV 7.4) concerning for primary bronchogenic carcinoma.  There were hypermetabolic ipsilateral hilar and mediastinal lymph node metastasis. There was malignant range FDG uptake associated with bilateral level 2 cervical lymph nodes of uncertain significance.  EBUS assisted biopsy of a right lower paratracheal and right hilar lymph node on 07/02/2013 confirmed non-small cell carcinoma, favoring squamous cell carcinoma.  Clinical stage was T1N2M0.  He received concurrent chemotherapy and radiation.  He received 6,000 cGy from 08/05/2014 - 09/14/2013.  He received weekly carboplatin and Taxol x 6 from 08/02/2013 - 10/25/2013.  Course was complicated by a late pneumothorax secondary to port placement.  He also had a seizure in 03/2014 with subsequent fracture of lumbar vertebrae and rod placement.  PET scan on 06/01/2014 revealed no evidence of residual  hypermetabolic disease within the chest.  There was focal bilateral activity in the upper neck reflecting underlying bilateral parotid lesions (benign).  Chest CT angiogram on 03/29/2015 revealed no evidence of pulmonary embolism.  There was irregular soft tissue thickening around the right hilum which had progressed since 09/28/2014.  Findings were worrisome for recurrent tumor.  PET scan on 04/12/2015 revealed mild soft tissue fullness along the posterior right upper lobe/perihilar region, with associated mild vague hypermetabolism (SUV 2.6).  There were hypermetabolic bilateral adrenal metastases, right (2.7 cm; SUV 10) greater than left (2.1 cm; SUV 7.8).  There was small hypermetabolic upper abdominal nodal metastases (9 mm porta hepatitis (SUV 7.3) and 7 mm portacaval node (SUV 5.3)).  CT guided right adrenal biopsy on 04/25/2015 revealed metastatic adenocarcinoma of lung primary.  TTF-1 was positive.  He has developed progressive anemia over the past 3 months.  Hematocrit has decreased from 41.7 to 36.3.  His last colonoscopy was just prior to his diagnosis.  He has never had an EGD.  He denies any melena or hematochezia.  Diet is poor.  Anemia work-up on 04/05/2015 revealed iron deficiency anemia (ferritin 48, iron saturation 12%, TIBC 310).  Sedimentation rate was 27.  B12 was 255 (low normal) with a folate 13.8 (normal).  MMA was normal (246).  TSH was 8.840 (high) with normal T4 and T3U, and free thyroxine index.  He is on oral iron.  Symptomatically, he is anxious.  He notes shortness of breath with exertion (stable).  He has lost 2 pounds.  Plan: 1.  Discuss current status of testing.  Molecular markers not back.  Discuss proceeding with standard chemotherapy (carboplatin and Alimta) or waiting for markers and potentially more directed therapy.  Side effects of chemotherapy reviewed.  Discussed M62 and folic acid suppilimentation and Decadron around chemotherapy to prevent side effects.   If patient desires, could begin with carboplatin and Alimta and switch to targeted therapy once test results back. 2.  Contact lab during clinic- done.  Anticipate markers will be available beginning of next week. 3.  After extended conversation, patient wishes to wait until next week. 4.  Discuss need for primary care physician. 5.  Discuss concern for weight loss and caloric intake. 6.  Discuss anxiety.  Rx: Ativan 0.5 mg po q 8 hours prn.  Dis #60.  Patient to obtain future anxiolytics from PCP. 7.  RTC on 05/18/2015 for MD assess, review of test results and planned treatment, and +/- B12 injection.    Lequita Asal, MD  05/12/2015

## 2015-05-12 NOTE — Progress Notes (Signed)
Concerned about urination and not being able to urinate and felt the need to.  No other concerns at this moment

## 2015-05-13 ENCOUNTER — Encounter: Payer: Self-pay | Admitting: Hematology and Oncology

## 2015-05-15 ENCOUNTER — Encounter: Payer: Self-pay | Admitting: Pathology

## 2015-05-18 ENCOUNTER — Inpatient Hospital Stay: Payer: BLUE CROSS/BLUE SHIELD

## 2015-05-18 ENCOUNTER — Inpatient Hospital Stay (HOSPITAL_BASED_OUTPATIENT_CLINIC_OR_DEPARTMENT_OTHER): Payer: BLUE CROSS/BLUE SHIELD | Admitting: Hematology and Oncology

## 2015-05-18 ENCOUNTER — Telehealth: Payer: Self-pay | Admitting: *Deleted

## 2015-05-18 ENCOUNTER — Other Ambulatory Visit: Payer: Self-pay | Admitting: Hematology and Oncology

## 2015-05-18 VITALS — BP 126/85 | HR 98 | Temp 97.6°F | Resp 18 | Wt 121.9 lb

## 2015-05-18 DIAGNOSIS — C7972 Secondary malignant neoplasm of left adrenal gland: Secondary | ICD-10-CM

## 2015-05-18 DIAGNOSIS — C3490 Malignant neoplasm of unspecified part of unspecified bronchus or lung: Secondary | ICD-10-CM

## 2015-05-18 DIAGNOSIS — Z923 Personal history of irradiation: Secondary | ICD-10-CM

## 2015-05-18 DIAGNOSIS — C771 Secondary and unspecified malignant neoplasm of intrathoracic lymph nodes: Secondary | ICD-10-CM | POA: Diagnosis not present

## 2015-05-18 DIAGNOSIS — C7800 Secondary malignant neoplasm of unspecified lung: Secondary | ICD-10-CM

## 2015-05-18 DIAGNOSIS — C3411 Malignant neoplasm of upper lobe, right bronchus or lung: Secondary | ICD-10-CM | POA: Diagnosis not present

## 2015-05-18 DIAGNOSIS — C7971 Secondary malignant neoplasm of right adrenal gland: Secondary | ICD-10-CM

## 2015-05-18 DIAGNOSIS — Z79899 Other long term (current) drug therapy: Secondary | ICD-10-CM

## 2015-05-18 DIAGNOSIS — Z9221 Personal history of antineoplastic chemotherapy: Secondary | ICD-10-CM

## 2015-05-18 DIAGNOSIS — D509 Iron deficiency anemia, unspecified: Secondary | ICD-10-CM

## 2015-05-18 LAB — CBC WITH DIFFERENTIAL/PLATELET
Basophils Absolute: 0 10*3/uL (ref 0–0.1)
Basophils Relative: 1 %
Eosinophils Absolute: 0.2 10*3/uL (ref 0–0.7)
Eosinophils Relative: 4 %
HCT: 39.7 % — ABNORMAL LOW (ref 40.0–52.0)
Hemoglobin: 13.2 g/dL (ref 13.0–18.0)
Lymphocytes Relative: 17 %
Lymphs Abs: 0.8 10*3/uL — ABNORMAL LOW (ref 1.0–3.6)
MCH: 27.8 pg (ref 26.0–34.0)
MCHC: 33.3 g/dL (ref 32.0–36.0)
MCV: 83.6 fL (ref 80.0–100.0)
Monocytes Absolute: 0.6 10*3/uL (ref 0.2–1.0)
Monocytes Relative: 12 %
Neutro Abs: 3.4 10*3/uL (ref 1.4–6.5)
Neutrophils Relative %: 66 %
Platelets: 181 10*3/uL (ref 150–440)
RBC: 4.74 MIL/uL (ref 4.40–5.90)
RDW: 14 % (ref 11.5–14.5)
WBC: 5.1 10*3/uL (ref 3.8–10.6)

## 2015-05-18 LAB — COMPREHENSIVE METABOLIC PANEL
ALT: 15 U/L — ABNORMAL LOW (ref 17–63)
AST: 16 U/L (ref 15–41)
Albumin: 3.8 g/dL (ref 3.5–5.0)
Alkaline Phosphatase: 77 U/L (ref 38–126)
Anion gap: 5 (ref 5–15)
BUN: 19 mg/dL (ref 6–20)
CO2: 29 mmol/L (ref 22–32)
Calcium: 9.3 mg/dL (ref 8.9–10.3)
Chloride: 106 mmol/L (ref 101–111)
Creatinine, Ser: 0.92 mg/dL (ref 0.61–1.24)
GFR calc Af Amer: >60 mL/min (ref >60)
GFR calc non Af Amer: >60 mL/min (ref >60)
Glucose, Bld: 82 mg/dL (ref 65–99)
Potassium: 4 mmol/L (ref 3.5–5.1)
Sodium: 140 mmol/L (ref 135–145)
Total Bilirubin: 0.3 mg/dL (ref 0.3–1.2)
Total Protein: 7.6 g/dL (ref 6.5–8.1)

## 2015-05-18 MED ORDER — CYANOCOBALAMIN 1000 MCG/ML IJ SOLN
1000.0000 ug | Freq: Once | INTRAMUSCULAR | Status: AC
  Administered 2015-05-18: 1000 ug via INTRAMUSCULAR
  Filled 2015-05-18: qty 1

## 2015-05-18 MED ORDER — ONDANSETRON HCL 8 MG PO TABS: 8.0000 mg | ORAL_TABLET | Freq: Two times a day (BID) | ORAL | Status: DC | PRN

## 2015-05-18 MED ORDER — DEXAMETHASONE 4 MG PO TABS: ORAL_TABLET | ORAL | Status: DC

## 2015-05-18 MED ORDER — FOLIC ACID 1 MG PO TABS: 1.0000 mg | ORAL_TABLET | Freq: Every day | ORAL | Status: DC

## 2015-05-18 NOTE — Telephone Encounter (Signed)
Has enough med for 20 days, has an appt next week, have him ask for refill at that appt. Per Dr Mike Gip. Pt informed

## 2015-05-18 NOTE — Progress Notes (Signed)
[facility] day:  05/18/2015   Chief Complaint: Darryl Hatfield is a 55 y.o. 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  HPI:  The patient was last seen in the medical oncology clinic on 05/12/2015.  At that time, we discussed standard chemotherapy versus awaiting molecular testing for potential targeted therapy.  We asked his weight loss and caloric intake. We discussed his anxiety.  A prescription for Ativan was provided. It was recommended that he obtain a primary care physician.    PD-L1 testing was negative (< 1%).  EGFR was negative.  ALK and ROS1 are pending.  During the interim, he has continued to have a high level of anxiety. He has had 2 days of diarrhea.  He has been eating a lot of oatmeal.  He notes indigestion.   Past Medical History  Diagnosis Date  . COPD (chronic obstructive pulmonary disease) ([place])   . Lung cancer ([place]) 2015    RIght   . CHF (congestive heart failure) ([place])   . Seizures ([place])   . Hypothyroidism     Past Surgical History  Procedure Laterality Date  . Back surgery    . Portacath placement Right     Family History  Problem Relation Age of Onset  . Hypertension Other     Social History:  reports that he quit smoking about 22 months ago. He has never used smokeless tobacco. He reports that he does not drink alcohol or use illicit drugs.  He is on disability.  He is going to a pig picking.  The patient is accompanied by his wife, [name],  today.  Allergies:  Allergies  Allergen Reactions  . No Known Allergies     Current Medications: Current Outpatient Prescriptions  Medication Sig Dispense Refill  . acetaminophen (TYLENOL) 325 MG tablet Take by mouth.    Marland Kitchen albuterol (PROVENTIL) (2.5 MG/3ML) 0.083% nebulizer solution Take 3 mLs (2.5 mg total)  by nebulization every 6 (six) hours as needed for wheezing or shortness of breath. 75 mL 2  . buPROPion (WELLBUTRIN SR) 200 MG 12 hr tablet Take 1 tablet (200 mg total) by mouth 2 (two) times daily. 60 tablet 2  . ferrous sulfate 325 (65 FE) MG tablet Take 325 mg by mouth daily with breakfast.    . Fluticasone-Salmeterol (ADVAIR) 250-50 MCG/DOSE AEPB Inhale 1 puff into the lungs 2 (two) times daily. 60 each 2  . gabapentin (NEURONTIN) 100 MG capsule Take 1 capsule (100 mg total) by mouth 3 (three) times daily. 90 capsule 4  . levothyroxine (SYNTHROID, LEVOTHROID) 88 MCG tablet Take 88 mcg by mouth daily before breakfast.    . LORazepam (ATIVAN) 0.5 MG tablet Take 1 tablet (0.5 mg total) by mouth every 8 (eight) hours as needed for anxiety. 60 tablet 0  . QUEtiapine (SEROQUEL) 25 MG tablet Take 1 tablet (25 mg total) by mouth at bedtime. 30 tablet 0  . tiotropium (SPIRIVA) 18 MCG inhalation capsule Place 18 mcg into inhaler and inhale daily.     No current facility-administered medications for this visit.   Facility-Administered Medications Ordered in Other Visits  Medication Dose Route Frequency Provider Last Rate Last Dose  . sodium chloride 0.9 % injection 10 mL  10 mL Intracatheter PRN Forest Gleason, MD   10 mL at 06/27/14 1338  .  sodium chloride 0.9 % injection 10 mL  10 mL Intracatheter PRN Leia Alf, MD   10 mL at 08/22/14 0851    Review of Systems:  GENERAL:  Feels "ok".  No fevers or sweats.  Weight up 1 pound.  Maximum weight 135 pounds. PERFORMANCE STATUS (ECOG):  2 HEENT:  No visual changes, runny nose, sore throat, mouth sores or tenderness. Lungs: Shortness of breath with exertion (stable).  No cough.  No hemoptysis. Cardiac:  No chest pain, palpitations, orthopnea, or PND. GI:  Diarrhea x 2 days.  No nausea, vomiting,constipation, melena or hematochezia.  No prior EGD.  Colonoscopy prior to diagnosis. GU:  No urgency, frequency, dysuria, or hematuria. Musculoskeletal:   Chronic back pain.  No joint pain.  No muscle tenderness. Extremities:  No pain or swelling. Skin:  No rashes or skin changes. Neuro:  No headache, numbness or weakness, balance or coordination issues. Endocrine:  No diabetes. On thyroid medication.  No hot flashes or night sweats. Psych:  High anxiety.  No mood changes or depression. Pain:  No focal pain. Review of systems:  All other systems reviewed and found to be negative.  Physical Exam: Blood pressure 126/85, pulse 98, temperature 97.6 F (36.4 C), temperature source Tympanic, resp. rate 18, weight 121 lb 14.6 oz (55.3 kg). GENERAL:  Thin gentleman sitting comfortably in the exam room in no acute distress. MENTAL STATUS:  Alert and oriented to person, place and time. HEAD:  Black hair with slight graying.  Temporal wasting.  Normocephalic, atraumatic, face symmetric, no Cushingoid features. EYES:  Glasses.  Blue eyes.  Pupils equal round and reactive to light and accomodation.  No conjunctivitis or scleral icterus. ENT:  Oropharynx clear without lesion.  Tongue normal. Mucous membranes moist.  RESPIRATORY:  Clear to auscultation without rales, wheezes or rhonchi. CARDIOVASCULAR:  Regular rate and rhythm without murmur, rub or gallop. ABDOMEN:  Soft, non-tender, with active bowel sounds, and no hepatosplenomegaly.  No masses. SKIN:  No rashes, ulcers or lesions. EXTREMITIES: No edema, no skin discoloration or tenderness.  No palpable cords. LYMPH NODES: No palpable cervical, supraclavicular, axillary or inguinal adenopathy  NEUROLOGICAL: Unremarkable. PSYCH:  Very anxious.    Appointment on 05/18/2015  Component Date Value Ref Range Status  . WBC 05/18/2015 5.1  3.8 - 10.6 K/uL Final  . RBC 05/18/2015 4.74  4.40 - 5.90 MIL/uL Final  . Hemoglobin 05/18/2015 13.2  13.0 - 18.0 g/dL Final  . HCT 05/18/2015 39.7* 40.0 - 52.0 % Final  . MCV 05/18/2015 83.6  80.0 - 100.0 fL Final  . MCH 05/18/2015 27.8  26.0 - 34.0 pg Final  . MCHC  05/18/2015 33.3  32.0 - 36.0 g/dL Final  . RDW 05/18/2015 14.0  11.5 - 14.5 % Final  . Platelets 05/18/2015 181  150 - 440 K/uL Final  . Neutrophils Relative % 05/18/2015 66   Final  . Neutro Abs 05/18/2015 3.4  1.4 - 6.5 K/uL Final  . Lymphocytes Relative 05/18/2015 17   Final  . Lymphs Abs 05/18/2015 0.8* 1.0 - 3.6 K/uL Final  . Monocytes Relative 05/18/2015 12   Final  . Monocytes Absolute 05/18/2015 0.6  0.2 - 1.0 K/uL Final  . Eosinophils Relative 05/18/2015 4   Final  . Eosinophils Absolute 05/18/2015 0.2  0 - 0.7 K/uL Final  . Basophils Relative 05/18/2015 1   Final  . Basophils Absolute 05/18/2015 0.0  0 - 0.1 K/uL Final  . Sodium 05/18/2015 140  135 - 145 mmol/L  Final  . Potassium 05/18/2015 4.0  3.5 - 5.1 mmol/L Final  . Chloride 05/18/2015 106  101 - 111 mmol/L Final  . CO2 05/18/2015 29  22 - 32 mmol/L Final  . Glucose, Bld 05/18/2015 82  65 - 99 mg/dL Final  . BUN 05/18/2015 19  6 - 20 mg/dL Final  . Creatinine, Ser 05/18/2015 0.92  0.61 - 1.24 mg/dL Final  . Calcium 05/18/2015 9.3  8.9 - 10.3 mg/dL Final  . Total Protein 05/18/2015 7.6  6.5 - 8.1 g/dL Final  . Albumin 05/18/2015 3.8  3.5 - 5.0 g/dL Final  . AST 05/18/2015 16  15 - 41 U/L Final  . ALT 05/18/2015 15* 17 - 63 U/L Final  . Alkaline Phosphatase 05/18/2015 77  38 - 126 U/L Final  . Total Bilirubin 05/18/2015 0.3  0.3 - 1.2 mg/dL Final  . GFR calc non Af Amer 05/18/2015 >60  >60 mL/min Final  . GFR calc Af Amer 05/18/2015 >60  >60 mL/min Final   Comment: (NOTE) The eGFR has been calculated using the CKD EPI equation. This calculation has not been validated in all clinical situations. eGFR's persistently <60 mL/min signify possible Chronic Kidney Disease.   Georgiann Hahn gap 05/18/2015 5  5 - 15 Final    Assessment:  Darryl Hatfield is a 55 y.o. 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    He was diagnosed with stage IIIA squamous  cell lung cancer in 07/02/2013.  PET scan on 06/23/2013 revealed a 1.5 cm right upper lobe nodule (SUV 7.4) concerning for primary bronchogenic carcinoma.  There were hypermetabolic ipsilateral hilar and mediastinal lymph node metastasis. There was malignant range FDG uptake associated with bilateral level 2 cervical lymph nodes of uncertain significance.  EBUS assisted biopsy of a right lower paratracheal and right hilar lymph node on 07/02/2013 confirmed non-small cell carcinoma, favoring squamous cell carcinoma.  Clinical stage was T1N2M0.  He received concurrent chemotherapy and radiation.  He received 6,000 cGy from 08/05/2014 - 09/14/2013.  He received weekly carboplatin and Taxol x 6 from 08/02/2013 - 10/25/2013.  Course was complicated by a late pneumothorax secondary to port placement.  He also had a seizure in 03/2014 with subsequent fracture of lumbar vertebrae and rod placement.  PET scan on 04/12/2015 revealed mild soft tissue fullness along the posterior right upper lobe/perihilar region, with associated mild vague hypermetabolism (SUV 2.6).  There were hypermetabolic bilateral adrenal metastases, right (2.7 cm; SUV 10) greater than left (2.1 cm; SUV 7.8).  There was small hypermetabolic upper abdominal nodal metastases (9 mm porta hepatitis (SUV 7.3) and 7 mm portacaval node (SUV 5.3)).  CT guided right adrenal biopsy on 04/25/2015 revealed metastatic adenocarcinoma of lung primary.  TTF-1 was positive.  PD-L1 was negative.  He has developed anemia over the past 3 months. Anemia work-up on 04/05/2015 revealed iron deficiency anemia (ferritin 48, iron saturation 12%, TIBC 310).  Sedimentation rate was 27.  B12 was 255 (low normal) with a folate 13.8 (normal).  MMA was normal (246).  TSH was 8.840 (high) with normal T4 and T3U, and free thyroxine index.   His last colonoscopy was just prior to his diagnosis.  He has never had an EGD.  He denies any melena or hematochezia.  Diet is poor.  He  is on oral iron.  Symptomatically, he remains anxious.  He has had 2 days of diarrhea (anxiety versus diet).  Weight is up 1 pound.   Plan: 1.  Discuss results of molecular markers to date.  Discuss initiating therapy with carboplatin and Alimta.  Side effects reviewed.  Discussed potential switch in therapy if markers reveal option for directed therapy.   2.  Pre-auth carboplatin and Alimta.  Consider addition of Avatin (discuss in future). 3.  B12 today and every 9 weeks (3 cycles). 4.  Begin folic acid 1 mg a day. 5.  Rx:  folic acid, dexamethasone, ondansetron. 6.  Follow-up pending molecular studies (ALK, ROS1). 7.  RTC in 1 week for MD assess, labs (CBC with diff, CMP, Mg, CEA), and cycle #1 carboplatin and Alimta.   Lequita Asal, MD  05/18/2015, 10:21 AM

## 2015-05-18 NOTE — Telephone Encounter (Signed)
States he takes Ativan tid and was only given #30 tabs and he does not see his PCP until 6/2, so he needs more med to last until then

## 2015-05-20 ENCOUNTER — Encounter: Payer: Self-pay | Admitting: Hematology and Oncology

## 2015-05-25 ENCOUNTER — Inpatient Hospital Stay: Payer: BLUE CROSS/BLUE SHIELD | Attending: Hematology and Oncology

## 2015-05-25 ENCOUNTER — Inpatient Hospital Stay (HOSPITAL_BASED_OUTPATIENT_CLINIC_OR_DEPARTMENT_OTHER): Payer: BLUE CROSS/BLUE SHIELD | Admitting: Hematology and Oncology

## 2015-05-25 ENCOUNTER — Other Ambulatory Visit: Payer: Self-pay | Admitting: Hematology and Oncology

## 2015-05-25 ENCOUNTER — Inpatient Hospital Stay: Payer: BLUE CROSS/BLUE SHIELD

## 2015-05-25 VITALS — BP 121/79 | HR 101 | Temp 96.8°F | Resp 19 | Ht 68.0 in | Wt 122.9 lb

## 2015-05-25 DIAGNOSIS — Z79899 Other long term (current) drug therapy: Secondary | ICD-10-CM | POA: Insufficient documentation

## 2015-05-25 DIAGNOSIS — D509 Iron deficiency anemia, unspecified: Secondary | ICD-10-CM | POA: Diagnosis not present

## 2015-05-25 DIAGNOSIS — E039 Hypothyroidism, unspecified: Secondary | ICD-10-CM | POA: Insufficient documentation

## 2015-05-25 DIAGNOSIS — I509 Heart failure, unspecified: Secondary | ICD-10-CM | POA: Diagnosis not present

## 2015-05-25 DIAGNOSIS — C7800 Secondary malignant neoplasm of unspecified lung: Secondary | ICD-10-CM | POA: Insufficient documentation

## 2015-05-25 DIAGNOSIS — Z923 Personal history of irradiation: Secondary | ICD-10-CM | POA: Diagnosis not present

## 2015-05-25 DIAGNOSIS — C771 Secondary and unspecified malignant neoplasm of intrathoracic lymph nodes: Secondary | ICD-10-CM | POA: Diagnosis not present

## 2015-05-25 DIAGNOSIS — Z5111 Encounter for antineoplastic chemotherapy: Secondary | ICD-10-CM | POA: Diagnosis not present

## 2015-05-25 DIAGNOSIS — R569 Unspecified convulsions: Secondary | ICD-10-CM | POA: Insufficient documentation

## 2015-05-25 DIAGNOSIS — C7971 Secondary malignant neoplasm of right adrenal gland: Secondary | ICD-10-CM | POA: Insufficient documentation

## 2015-05-25 DIAGNOSIS — C3411 Malignant neoplasm of upper lobe, right bronchus or lung: Secondary | ICD-10-CM

## 2015-05-25 DIAGNOSIS — C7972 Secondary malignant neoplasm of left adrenal gland: Secondary | ICD-10-CM

## 2015-05-25 DIAGNOSIS — J449 Chronic obstructive pulmonary disease, unspecified: Secondary | ICD-10-CM | POA: Diagnosis not present

## 2015-05-25 DIAGNOSIS — C3490 Malignant neoplasm of unspecified part of unspecified bronchus or lung: Secondary | ICD-10-CM

## 2015-05-25 DIAGNOSIS — Z87891 Personal history of nicotine dependence: Secondary | ICD-10-CM | POA: Diagnosis not present

## 2015-05-25 DIAGNOSIS — F419 Anxiety disorder, unspecified: Secondary | ICD-10-CM | POA: Diagnosis not present

## 2015-05-25 LAB — CBC WITH DIFFERENTIAL/PLATELET
Basophils Absolute: 0 10*3/uL (ref 0–0.1)
Basophils Relative: 0 %
Eosinophils Absolute: 0 10*3/uL (ref 0–0.7)
Eosinophils Relative: 0 %
HCT: 39.7 % — ABNORMAL LOW (ref 40.0–52.0)
Hemoglobin: 13.3 g/dL (ref 13.0–18.0)
Lymphocytes Relative: 10 %
Lymphs Abs: 0.9 10*3/uL — ABNORMAL LOW (ref 1.0–3.6)
MCH: 28.1 pg (ref 26.0–34.0)
MCHC: 33.5 g/dL (ref 32.0–36.0)
MCV: 83.7 fL (ref 80.0–100.0)
Monocytes Absolute: 0.4 10*3/uL (ref 0.2–1.0)
Monocytes Relative: 4 %
Neutro Abs: 7.8 10*3/uL — ABNORMAL HIGH (ref 1.4–6.5)
Neutrophils Relative %: 86 %
Platelets: 212 10*3/uL (ref 150–440)
RBC: 4.74 MIL/uL (ref 4.40–5.90)
RDW: 14.3 % (ref 11.5–14.5)
WBC: 9.1 10*3/uL (ref 3.8–10.6)

## 2015-05-25 LAB — MAGNESIUM: Magnesium: 2.1 mg/dL (ref 1.7–2.4)

## 2015-05-25 LAB — COMPREHENSIVE METABOLIC PANEL
ALT: 53 U/L (ref 17–63)
AST: 47 U/L — ABNORMAL HIGH (ref 15–41)
Albumin: 3.9 g/dL (ref 3.5–5.0)
Alkaline Phosphatase: 88 U/L (ref 38–126)
Anion gap: 8 (ref 5–15)
BUN: 20 mg/dL (ref 6–20)
CO2: 27 mmol/L (ref 22–32)
Calcium: 9.5 mg/dL (ref 8.9–10.3)
Chloride: 104 mmol/L (ref 101–111)
Creatinine, Ser: 0.72 mg/dL (ref 0.61–1.24)
GFR calc Af Amer: >60 mL/min (ref >60)
GFR calc non Af Amer: >60 mL/min (ref >60)
Glucose, Bld: 162 mg/dL — ABNORMAL HIGH (ref 65–99)
Potassium: 4 mmol/L (ref 3.5–5.1)
Sodium: 139 mmol/L (ref 135–145)
Total Bilirubin: 0.3 mg/dL (ref 0.3–1.2)
Total Protein: 7.8 g/dL (ref 6.5–8.1)

## 2015-05-25 MED ORDER — SODIUM CHLORIDE 0.9% FLUSH
10.0000 mL | INTRAVENOUS | Status: DC | PRN
  Administered 2015-05-25: 10 mL via INTRAVENOUS
  Filled 2015-05-25: qty 10

## 2015-05-25 MED ORDER — PALONOSETRON HCL INJECTION 0.25 MG/5ML
0.2500 mg | Freq: Once | INTRAVENOUS | Status: AC
  Administered 2015-05-25: 0.25 mg via INTRAVENOUS
  Filled 2015-05-25: qty 5

## 2015-05-25 MED ORDER — HEPARIN SOD (PORK) LOCK FLUSH 100 UNIT/ML IV SOLN
500.0000 [IU] | Freq: Once | INTRAVENOUS | Status: AC
  Administered 2015-05-25: 500 [IU] via INTRAVENOUS
  Filled 2015-05-25: qty 5

## 2015-05-25 MED ORDER — SODIUM CHLORIDE 0.9 % IV SOLN
Freq: Once | INTRAVENOUS | Status: AC
  Administered 2015-05-25: 11:00:00 via INTRAVENOUS
  Filled 2015-05-25: qty 1000

## 2015-05-25 MED ORDER — SODIUM CHLORIDE 0.9 % IV SOLN
500.0000 mg/m2 | Freq: Once | INTRAVENOUS | Status: AC
  Administered 2015-05-25: 800 mg via INTRAVENOUS
  Filled 2015-05-25: qty 28

## 2015-05-25 MED ORDER — DEXAMETHASONE SODIUM PHOSPHATE 100 MG/10ML IJ SOLN
10.0000 mg | Freq: Once | INTRAMUSCULAR | Status: AC
  Administered 2015-05-25: 10 mg via INTRAVENOUS
  Filled 2015-05-25: qty 1

## 2015-05-25 MED ORDER — SODIUM CHLORIDE 0.9 % IV SOLN
528.5000 mg | Freq: Once | INTRAVENOUS | Status: AC
  Administered 2015-05-25: 530 mg via INTRAVENOUS
  Filled 2015-05-25: qty 53

## 2015-05-25 NOTE — Progress Notes (Signed)
Pt reports yesterday a rumbling in his tummy yesterday and would like to know if steroids could cause it.  I asked pt if he was nervous about today and he shook his head yes.  Reported some left upper chest pain and wife also states that could be his nerves.  No other concerns or changes

## 2015-05-26 LAB — CEA: CEA: 36.5 ng/mL — ABNORMAL HIGH (ref 0.0–4.7)

## 2015-06-01 ENCOUNTER — Telehealth: Payer: Self-pay | Admitting: *Deleted

## 2015-06-01 NOTE — Telephone Encounter (Signed)
Per Dr Mike Gip MOM or Mag Citrate can be used. I spoke with [name] and discussed this and she said she will pick some up on her way home from work

## 2015-06-01 NOTE — Telephone Encounter (Signed)
No BM in 2 - 3 days, she has given him a dose of Miralax and told him to drink more water. Asking what is causing it and what they should do about it

## 2015-06-04 ENCOUNTER — Encounter: Payer: Self-pay | Admitting: Hematology and Oncology

## 2015-06-04 NOTE — Progress Notes (Signed)
Darryl Hatfield:  05/25/2015   Chief Complaint: Darryl Hatfield is a 55 y.o. 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  HPI:  The patient was last seen in the medical oncology clinic on 05/18/2015.  At that time, we decision was made to proceed with carboplatin and Alimta.  Side effects of treatment were reviewed.  He received a B12 injection and began folic acid 1 mg a Hatfield.  He was given a prescription for Decadron 4 mg BID for the Hatfield before and after chemotherapy.  During the interim, he has felt about the same.  He remains anxious about his recurrent disease.  He has shortness of breath with exertion. He has had some mild left upper chest pain without radiation.  His wife believes it is due to anxiety.   Past Medical History  Diagnosis Date  . COPD (chronic obstructive pulmonary disease) ([place])   . Lung cancer ([place]) 2015    RIght   . CHF (congestive heart failure) ([place])   . Seizures ([place])   . Hypothyroidism     Past Surgical History  Procedure Laterality Date  . Back surgery    . Portacath placement Right     Family History  Problem Relation Age of Onset  . Hypertension Other     Social History:  reports that he quit smoking about 1 years ago. He has never used smokeless tobacco. He reports that he does not drink alcohol or use illicit drugs.  He is on disability.  The patient is accompanied by his wife, Darryl Hatfield,  today.  Allergies:  Allergies  Allergen Reactions  . No Known Allergies     Current Medications: Current Outpatient Prescriptions  Medication Sig Dispense Refill  . acetaminophen (TYLENOL) 325 MG tablet Take by mouth.    Marland Kitchen albuterol (PROVENTIL) (2.5 MG/3ML) 0.083% nebulizer solution Take 3 mLs (2.5 mg total) by nebulization every 6 (six) hours as needed for  wheezing or shortness of breath. 75 mL 2  . buPROPion (WELLBUTRIN SR) 200 MG 12 hr tablet Take 1 tablet (200 mg total) by mouth 2 (two) times daily. 60 tablet 2  . dexamethasone (DECADRON) 4 MG tablet Take 1 tab two times a Hatfield the Hatfield before Alimta chemo. Take 2 tabs two times a Hatfield starting the Hatfield after chemo for 3 days. 30 tablet 1  . ferrous sulfate 325 (65 FE) MG tablet Take 325 mg by mouth daily with breakfast.    . Fluticasone-Salmeterol (ADVAIR) 250-50 MCG/DOSE AEPB Inhale 1 puff into the lungs 2 (two) times daily. 60 each 2  . folic acid (FOLVITE) 1 MG tablet Take 1 tablet (1 mg total) by mouth daily. Start 5-7 days before Alimta chemotherapy. Continue until 21 days after Alimta completed. 100 tablet 3  . gabapentin (NEURONTIN) 100 MG capsule Take 1 capsule (100 mg total) by mouth 3 (three) times daily. 90 capsule 4  . levothyroxine (SYNTHROID, LEVOTHROID) 88 MCG tablet Take 88 mcg by mouth daily before breakfast.    . LORazepam (ATIVAN) 0.5 MG tablet Take 1 tablet (0.5 mg total) by mouth every 8 (eight) hours as needed for anxiety. 60 tablet 0  . ondansetron (ZOFRAN) 8 MG tablet Take 1 tablet (8 mg total) by mouth 2 (two) times daily as needed for refractory nausea / vomiting. Start on  Hatfield 3 after chemo. 30 tablet 1  . QUEtiapine (SEROQUEL) 25 MG tablet Take 1 tablet (25 mg total) by mouth at bedtime. 30 tablet 0  . tiotropium (SPIRIVA) 18 MCG inhalation capsule Place 18 mcg into inhaler and inhale daily.     No current facility-administered medications for this visit.   Facility-Administered Medications Ordered in Other Visits  Medication Dose Route Frequency Provider Last Rate Last Dose  . sodium chloride 0.9 % injection 10 mL  10 mL Intracatheter PRN Forest Gleason, MD   10 mL at 06/27/14 1338  . sodium chloride 0.9 % injection 10 mL  10 mL Intracatheter PRN Leia Alf, MD   10 mL at 08/22/14 0851    Review of Systems:  GENERAL:  Feels "the same".  No fevers or sweats.  Weight  up 1 pound.  Maximum weight 135 pounds. PERFORMANCE STATUS (ECOG):  2 HEENT:  No visual changes, runny nose, sore throat, mouth sores or tenderness. Lungs: Shortness of breath with exertion (stable).  No cough.  No hemoptysis.  Left upper chest pain, intermittent. Cardiac:  No chest pain, palpitations, orthopnea, or PND. GI:  No nausea, vomiting, diarrhea, constipation, melena or hematochezia.  No prior EGD.  Colonoscopy prior to diagnosis. GU:  No urgency, frequency, dysuria, or hematuria. Musculoskeletal:  Chronic back pain.  No joint pain.  No muscle tenderness. Extremities:  No pain or swelling. Skin:  No rashes or skin changes. Neuro:  No headache, numbness or weakness, balance or coordination issues. Endocrine:  No diabetes. On thyroid medication.  No hot flashes or night sweats. Psych:  High anxiety (no change).  No mood changes or depression. Pain:  No focal pain. Review of systems:  All other systems reviewed and found to be negative.  Physical Exam: Blood pressure 121/79, pulse 101, temperature 96.8 F (36 C), temperature source Tympanic, resp. rate 19, height _0  (1.727 m), weight 122 lb 14.5 oz (55.75 kg). GENERAL:  Thin gentleman sitting comfortably in the exam room in no acute distress. MENTAL STATUS:  Alert and oriented to person, place and time. HEAD:  Black hair with slight graying.  Mustache.  Temporal wasting.  Normocephalic, atraumatic, face symmetric, no Cushingoid features. EYES:  Glasses.  Blue eyes.  Pupils equal round and reactive to light and accomodation.  No conjunctivitis or scleral icterus. ENT:  Oropharynx clear without lesion.  Tongue normal. Mucous membranes moist.  RESPIRATORY:  Clear to auscultation without rales, wheezes or rhonchi. CARDIOVASCULAR:  Regular rate and rhythm without murmur, rub or gallop. ABDOMEN:  Soft, non-tender, with active bowel sounds, and no hepatosplenomegaly.  No masses. SKIN:  No rashes, ulcers or lesions. EXTREMITIES: No  edema, no skin discoloration or tenderness.  No palpable cords. LYMPH NODES: No palpable cervical, supraclavicular, axillary or inguinal adenopathy  NEUROLOGICAL: Unremarkable. PSYCH:  Very anxious.    Infusion on 05/25/2015  Component Date Value Ref Range Status  . WBC 05/25/2015 9.1  3.8 - 10.6 K/uL Final  . RBC 05/25/2015 4.74  4.40 - 5.90 MIL/uL Final  . Hemoglobin 05/25/2015 13.3  13.0 - 18.0 g/dL Final  . HCT 05/25/2015 39.7* 40.0 - 52.0 % Final  . MCV 05/25/2015 83.7  80.0 - 100.0 fL Final  . MCH 05/25/2015 28.1  26.0 - 34.0 pg Final  . MCHC 05/25/2015 33.5  32.0 - 36.0 g/dL Final  . RDW 05/25/2015 14.3  11.5 - 14.5 % Final  . Platelets 05/25/2015 212  150 - 440 K/uL Final  . Neutrophils Relative %  05/25/2015 86   Final  . Neutro Abs 05/25/2015 7.8* 1.4 - 6.5 K/uL Final  . Lymphocytes Relative 05/25/2015 10   Final  . Lymphs Abs 05/25/2015 0.9* 1.0 - 3.6 K/uL Final  . Monocytes Relative 05/25/2015 4   Final  . Monocytes Absolute 05/25/2015 0.4  0.2 - 1.0 K/uL Final  . Eosinophils Relative 05/25/2015 0   Final  . Eosinophils Absolute 05/25/2015 0.0  0 - 0.7 K/uL Final  . Basophils Relative 05/25/2015 0   Final  . Basophils Absolute 05/25/2015 0.0  0 - 0.1 K/uL Final  . Sodium 05/25/2015 139  135 - 145 mmol/L Final  . Potassium 05/25/2015 4.0  3.5 - 5.1 mmol/L Final  . Chloride 05/25/2015 104  101 - 111 mmol/L Final  . CO2 05/25/2015 27  22 - 32 mmol/L Final  . Glucose, Bld 05/25/2015 162* 65 - 99 mg/dL Final  . BUN 05/25/2015 20  6 - 20 mg/dL Final  . Creatinine, Ser 05/25/2015 0.72  0.61 - 1.24 mg/dL Final  . Calcium 05/25/2015 9.5  8.9 - 10.3 mg/dL Final  . Total Protein 05/25/2015 7.8  6.5 - 8.1 g/dL Final  . Albumin 05/25/2015 3.9  3.5 - 5.0 g/dL Final  . AST 05/25/2015 47* 15 - 41 U/L Final  . ALT 05/25/2015 53  17 - 63 U/L Final  . Alkaline Phosphatase 05/25/2015 88  38 - 126 U/L Final  . Total Bilirubin 05/25/2015 0.3  0.3 - 1.2 mg/dL Final  . GFR calc non Af  Amer 05/25/2015 >60  >60 mL/min Final  . GFR calc Af Amer 05/25/2015 >60  >60 mL/min Final   Comment: (NOTE) The eGFR has been calculated using the CKD EPI equation. This calculation has not been validated in all clinical situations. eGFR's persistently <60 mL/min signify possible Chronic Hatfield Disease.   . Anion gap 05/25/2015 8  5 - 15 Final  . Magnesium 05/25/2015 2.1  1.7 - 2.4 mg/dL Final  . CEA 05/25/2015 36.5* 0.0 - 4.7 ng/mL Final   Comment: (NOTE)       Roche ECLIA methodology       Nonsmokers  <3.9                                     Smokers     <5.6 Performed At: [facility] [address] [place], [place] [id number] Lindon Romp MD RX:[id number]     Assessment:  KAPONO LUHN is a 55 y.o. 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    He was diagnosed with stage IIIA squamous cell lung cancer in 07/02/2013.  PET scan on 06/23/2013 revealed a 1.5 cm right upper lobe nodule (SUV 7.4) concerning for primary bronchogenic carcinoma.  There were hypermetabolic ipsilateral hilar and mediastinal lymph node metastasis. There was malignant range FDG uptake associated with bilateral level 2 cervical lymph nodes of uncertain significance.  EBUS assisted biopsy of a right lower paratracheal and right hilar lymph node on 07/02/2013 confirmed non-small cell carcinoma, favoring squamous cell carcinoma.  Clinical stage was T1N2M0.  He received concurrent chemotherapy and radiation.  He received 6,000 cGy from 08/05/2014 - 09/14/2013.  He received weekly carboplatin and Taxol x 6 from 08/02/2013 - 10/25/2013.  Course was complicated by a late pneumothorax secondary to port placement.  He also had a seizure  in 03/2014 with subsequent fracture of lumbar vertebrae and rod placement.  PET scan on 04/12/2015 revealed mild soft tissue fullness along the posterior right upper lobe/perihilar region, with  associated mild vague hypermetabolism (SUV 2.6).  There were hypermetabolic bilateral adrenal metastases, right (2.7 cm; SUV 10) greater than left (2.1 cm; SUV 7.8).  There was small hypermetabolic upper abdominal nodal metastases (9 mm porta hepatitis (SUV 7.3) and 7 mm portacaval node (SUV 5.3)).  CT guided right adrenal biopsy on 04/25/2015 revealed metastatic adenocarcinoma of lung primary.  TTF-1 was positive.  PD-L1 testing was negative (< 1%).  EGFR was negative.  ALK and ROS1 are pending.  He has a mild normocytic anemia.  Anemia work-up on 04/05/2015 revealed iron deficiency anemia (ferritin 48, iron saturation 12%, TIBC 310).  Sedimentation rate was 27.  B12 was 255 (low normal) with a folate 13.8 (normal).  MMA was normal (246).  TSH was 8.840 (high) with normal T4 and T3U, and free thyroxine index.   His last colonoscopy was just prior to his diagnosis.  He has never had an EGD.  He denies any melena or hematochezia.  Diet is poor.  He is on oral iron.  He received B12 on 05/18/2015.  He is on folic acid.  Symptomatically, he remains anxious.  Exam is stable.   Plan: 1.  Labs today:  CBC with diff, CMP, Mg, CEA. 2.  Review side effects associated with chemotherapy.  Review antiemetics and steroids.  Patient consented to treatment. 3.  Cycle #1 carboplatin and Alimta. 4.  Follow-up pending molecular studies (ALK, ROS1). 5.  RTC on 06/05/2015 for MD assess and labs (CBC with diff,BMP). 6.  RTC in 3 weeks for MD assessment, labs (CBC with diff, CMP, Mg, CEA), and cycle #2 carboplatin and Alimta.   Lequita Asal, MD  05/25/2015

## 2015-06-05 ENCOUNTER — Inpatient Hospital Stay: Payer: BLUE CROSS/BLUE SHIELD

## 2015-06-05 ENCOUNTER — Inpatient Hospital Stay (HOSPITAL_BASED_OUTPATIENT_CLINIC_OR_DEPARTMENT_OTHER): Payer: BLUE CROSS/BLUE SHIELD | Admitting: Hematology and Oncology

## 2015-06-05 VITALS — BP 114/75 | HR 101 | Temp 97.2°F | Resp 18 | Ht 68.0 in | Wt 124.0 lb

## 2015-06-05 DIAGNOSIS — C771 Secondary and unspecified malignant neoplasm of intrathoracic lymph nodes: Secondary | ICD-10-CM

## 2015-06-05 DIAGNOSIS — Z923 Personal history of irradiation: Secondary | ICD-10-CM

## 2015-06-05 DIAGNOSIS — K5900 Constipation, unspecified: Secondary | ICD-10-CM | POA: Insufficient documentation

## 2015-06-05 DIAGNOSIS — Z79899 Other long term (current) drug therapy: Secondary | ICD-10-CM

## 2015-06-05 DIAGNOSIS — C3490 Malignant neoplasm of unspecified part of unspecified bronchus or lung: Secondary | ICD-10-CM

## 2015-06-05 DIAGNOSIS — C7800 Secondary malignant neoplasm of unspecified lung: Secondary | ICD-10-CM | POA: Diagnosis not present

## 2015-06-05 DIAGNOSIS — K5909 Other constipation: Secondary | ICD-10-CM

## 2015-06-05 DIAGNOSIS — C7971 Secondary malignant neoplasm of right adrenal gland: Secondary | ICD-10-CM | POA: Diagnosis not present

## 2015-06-05 DIAGNOSIS — C7972 Secondary malignant neoplasm of left adrenal gland: Secondary | ICD-10-CM

## 2015-06-05 DIAGNOSIS — C3411 Malignant neoplasm of upper lobe, right bronchus or lung: Secondary | ICD-10-CM

## 2015-06-05 DIAGNOSIS — D509 Iron deficiency anemia, unspecified: Secondary | ICD-10-CM

## 2015-06-05 DIAGNOSIS — C7970 Secondary malignant neoplasm of unspecified adrenal gland: Secondary | ICD-10-CM

## 2015-06-05 LAB — BASIC METABOLIC PANEL
Anion gap: 7 (ref 5–15)
BUN: 20 mg/dL (ref 6–20)
CO2: 29 mmol/L (ref 22–32)
Calcium: 8.6 mg/dL — ABNORMAL LOW (ref 8.9–10.3)
Chloride: 98 mmol/L — ABNORMAL LOW (ref 101–111)
Creatinine, Ser: 0.86 mg/dL (ref 0.61–1.24)
GFR calc Af Amer: >60 mL/min (ref >60)
GFR calc non Af Amer: >60 mL/min (ref >60)
Glucose, Bld: 137 mg/dL — ABNORMAL HIGH (ref 65–99)
Potassium: 4.3 mmol/L (ref 3.5–5.1)
Sodium: 134 mmol/L — ABNORMAL LOW (ref 135–145)

## 2015-06-05 LAB — CBC WITH DIFFERENTIAL/PLATELET
Basophils Absolute: 0 10*3/uL (ref 0–0.1)
Basophils Relative: 0 %
Eosinophils Absolute: 0.1 10*3/uL (ref 0–0.7)
Eosinophils Relative: 3 %
HCT: 34.5 % — ABNORMAL LOW (ref 40.0–52.0)
Hemoglobin: 11.7 g/dL — ABNORMAL LOW (ref 13.0–18.0)
Lymphocytes Relative: 20 %
Lymphs Abs: 0.7 10*3/uL — ABNORMAL LOW (ref 1.0–3.6)
MCH: 28.2 pg (ref 26.0–34.0)
MCHC: 33.8 g/dL (ref 32.0–36.0)
MCV: 83.5 fL (ref 80.0–100.0)
Monocytes Absolute: 0.5 10*3/uL (ref 0.2–1.0)
Monocytes Relative: 16 %
Neutro Abs: 2 10*3/uL (ref 1.4–6.5)
Neutrophils Relative %: 61 %
Platelets: 124 10*3/uL — ABNORMAL LOW (ref 150–440)
RBC: 4.13 MIL/uL — ABNORMAL LOW (ref 4.40–5.90)
RDW: 13.6 % (ref 11.5–14.5)
WBC: 3.2 10*3/uL — ABNORMAL LOW (ref 3.8–10.6)

## 2015-06-05 NOTE — Progress Notes (Signed)
[facility]-  Cancer Center  Clinic day:  06/05/2015    Chief Complaint: Darryl Hatfield is a 55 y.o. 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  HPI:  The patient was last seen in the medical oncology clinic on 05/25/2015.  At that time, he received cycle #1 carboplatin and Alimta.  Symptomatically, he tolerated his chemotherapy well.  He has had ongoing issues with constipation.  He comments that years ago, he was "blocked and impacted".  He stopped oral iron 2 days ago.  He took 4-5 ounces of magnesium citrate yesterday and the day before.  He denies any nausea or vomiting.   Past Medical History  Diagnosis Date  . COPD (chronic obstructive pulmonary disease) ([facility])   . Lung cancer ([facility]) 2015    RIght   . CHF (congestive heart failure) ([facility])   . Seizures ([facility])   . Hypothyroidism     Past Surgical History  Procedure Laterality Date  . Back surgery    . Portacath placement Right     Family History  Problem Relation Age of Onset  . Hypertension Other     Social History:  reports that he quit smoking about 1 years ago. He has never used smokeless tobacco. He reports that he does not drink alcohol or use illicit drugs.  He is on disability.  The patient is accompanied by his wife, Corrie Dandy,  today.  Allergies:  Allergies  Allergen Reactions  . No Known Allergies     Current Medications: Current Outpatient Prescriptions  Medication Sig Dispense Refill  . acetaminophen (TYLENOL) 325 MG tablet Take by mouth.    Marland Kitchen albuterol (PROVENTIL) (2.5 MG/3ML) 0.083% nebulizer solution Take 3 mLs (2.5 mg total) by nebulization every 6 (six) hours as needed for wheezing or shortness of breath. 75 mL 2  . buPROPion (WELLBUTRIN SR) 200 MG 12 hr tablet Take 1 tablet (200 mg total) by mouth 2 (two) times daily. 60 tablet 2  . dexamethasone  (DECADRON) 4 MG tablet Take 1 tab two times a day the day before Alimta chemo. Take 2 tabs two times a day starting the day after chemo for 3 days. 30 tablet 1  . ferrous sulfate 325 (65 FE) MG tablet Take 325 mg by mouth daily with breakfast.    . Fluticasone-Salmeterol (ADVAIR) 250-50 MCG/DOSE AEPB Inhale 1 puff into the lungs 2 (two) times daily. 60 each 2  . folic acid (FOLVITE) 1 MG tablet Take 1 tablet (1 mg total) by mouth daily. Start 5-7 days before Alimta chemotherapy. Continue until 21 days after Alimta completed. 100 tablet 3  . gabapentin (NEURONTIN) 100 MG capsule Take 1 capsule (100 mg total) by mouth 3 (three) times daily. 90 capsule 4  . levothyroxine (SYNTHROID, LEVOTHROID) 88 MCG tablet Take 88 mcg by mouth daily before breakfast.    . LORazepam (ATIVAN) 0.5 MG tablet Take 1 tablet (0.5 mg total) by mouth every 8 (eight) hours as needed for anxiety. 60 tablet 0  . ondansetron (ZOFRAN) 8 MG tablet Take 1 tablet (8 mg total) by mouth 2 (two) times daily as needed for refractory nausea / vomiting. Start on day 3 after chemo. 30 tablet 1  . QUEtiapine (SEROQUEL) 25 MG tablet Take 1 tablet (25 mg total) by mouth at bedtime. 30 tablet 0  . tiotropium (SPIRIVA) 18 MCG  inhalation capsule Place 18 mcg into inhaler and inhale daily.     No current facility-administered medications for this visit.   Facility-Administered Medications Ordered in Other Visits  Medication Dose Route Frequency Provider Last Rate Last Dose  . sodium chloride 0.9 % injection 10 mL  10 mL Intracatheter PRN Forest Gleason, MD   10 mL at 06/27/14 1338  . sodium chloride 0.9 % injection 10 mL  10 mL Intracatheter PRN Leia Alf, MD   10 mL at 08/22/14 0851    Review of Systems:  GENERAL:  Feels "alright".  No fevers or sweats.  Weight up 2 pounds.  Maximum weight 135 pounds. PERFORMANCE STATUS (ECOG):  2 HEENT:  No visual changes, runny nose, sore throat, mouth sores or tenderness. Lungs: Shortness of breath  with exertion (stable).  No cough.  No hemoptysis.  Left upper chest pain, intermittent. Cardiac:  No chest pain, palpitations, orthopnea, or PND. GI:  No nausea, vomiting, diarrhea, constipation, melena or hematochezia.  No prior EGD.  Colonoscopy prior to diagnosis. GU:  No urgency, frequency, dysuria, or hematuria. Musculoskeletal:  Chronic back pain.  No joint pain.  No muscle tenderness. Extremities:  No pain or swelling. Skin:  No rashes or skin changes. Neuro:  No headache, numbness or weakness, balance or coordination issues. Endocrine:  No diabetes. On thyroid medication.  No hot flashes or night sweats. Psych:  High anxiety (stable).  No mood changes or depression. Pain:  No focal pain. Review of systems:  All other systems reviewed and found to be negative.  Physical Exam: Blood pressure 114/75, pulse 101, temperature 97.2 F (36.2 C), temperature source Tympanic, resp. rate 18, height '5\' 8"'$  (1.727 m), weight 124 lb 0.1 oz (56.25 kg), SpO2 97 %. GENERAL:  Thin gentleman sitting comfortably in the exam room in no acute distress. MENTAL STATUS:  Alert and oriented to person, place and time. HEAD:  Black hair with slight graying.  Mustache.  Temporal wasting.  Normocephalic, atraumatic, face symmetric, no Cushingoid features. EYES:  Glasses.  Blue eyes.  Pupils equal round and reactive to light and accomodation.  No conjunctivitis or scleral icterus. ENT:  Oropharynx clear without lesion.  Tongue normal. Mucous membranes moist.  RESPIRATORY:  Clear to auscultation without rales, wheezes or rhonchi. CARDIOVASCULAR:  Regular rate and rhythm without murmur, rub or gallop. ABDOMEN:  Soft, non-tender, with active bowel sounds, and no hepatosplenomegaly.  No masses. SKIN:  No rashes, ulcers or lesions. EXTREMITIES: No edema, no skin discoloration or tenderness.  No palpable cords. LYMPH NODES: No palpable cervical, supraclavicular, axillary or inguinal adenopathy.  NEUROLOGICAL:  Unremarkable. PSYCH:  Very anxious.    Appointment on 06/05/2015  Component Date Value Ref Range Status  . WBC 06/05/2015 3.2* 3.8 - 10.6 K/uL Final  . RBC 06/05/2015 4.13* 4.40 - 5.90 MIL/uL Final  . Hemoglobin 06/05/2015 11.7* 13.0 - 18.0 g/dL Final  . HCT 06/05/2015 34.5* 40.0 - 52.0 % Final  . MCV 06/05/2015 83.5  80.0 - 100.0 fL Final  . MCH 06/05/2015 28.2  26.0 - 34.0 pg Final  . MCHC 06/05/2015 33.8  32.0 - 36.0 g/dL Final  . RDW 06/05/2015 13.6  11.5 - 14.5 % Final  . Platelets 06/05/2015 124* 150 - 440 K/uL Final  . Neutrophils Relative % 06/05/2015 61   Final  . Neutro Abs 06/05/2015 2.0  1.4 - 6.5 K/uL Final  . Lymphocytes Relative 06/05/2015 20   Final  . Lymphs Abs 06/05/2015 0.7* 1.0 - 3.6  K/uL Final  . Monocytes Relative 06/05/2015 16   Final  . Monocytes Absolute 06/05/2015 0.5  0.2 - 1.0 K/uL Final  . Eosinophils Relative 06/05/2015 3   Final  . Eosinophils Absolute 06/05/2015 0.1  0 - 0.7 K/uL Final  . Basophils Relative 06/05/2015 0   Final  . Basophils Absolute 06/05/2015 0.0  0 - 0.1 K/uL Final  . Sodium 06/05/2015 134* 135 - 145 mmol/L Final  . Potassium 06/05/2015 4.3  3.5 - 5.1 mmol/L Final  . Chloride 06/05/2015 98* 101 - 111 mmol/L Final  . CO2 06/05/2015 29  22 - 32 mmol/L Final  . Glucose, Bld 06/05/2015 137* 65 - 99 mg/dL Final  . BUN 06/05/2015 20  6 - 20 mg/dL Final  . Creatinine, Ser 06/05/2015 0.86  0.61 - 1.24 mg/dL Final  . Calcium 06/05/2015 8.6* 8.9 - 10.3 mg/dL Final  . GFR calc non Af Amer 06/05/2015 >60  >60 mL/min Final  . GFR calc Af Amer 06/05/2015 >60  >60 mL/min Final   Comment: (NOTE) The eGFR has been calculated using the CKD EPI equation. This calculation has not been validated in all clinical situations. eGFR's persistently <60 mL/min signify possible Chronic Kidney Disease.   Georgiann Hahn gap 06/05/2015 7  5 - 15 Final    Assessment:  LORIK GUO is a 55 y.o. male with a history of stage IIIA squamous cell carcinoma of the  right lung and metastatic adenocarcinonma of the lung.    He was diagnosed with stage IIIA squamous cell lung cancer in 07/02/2013.  PET scan on 06/23/2013 revealed a 1.5 cm right upper lobe nodule (SUV 7.4) concerning for primary bronchogenic carcinoma.  There were hypermetabolic ipsilateral hilar and mediastinal lymph node metastasis. There was malignant range FDG uptake associated with bilateral level 2 cervical lymph nodes of uncertain significance.  EBUS assisted biopsy of a right lower paratracheal and right hilar lymph node on 07/02/2013 confirmed non-small cell carcinoma, favoring squamous cell carcinoma.  Clinical stage was T1N2M0.  He received concurrent chemotherapy and radiation.  He received 6,000 cGy from 08/05/2014 - 09/14/2013.  He received weekly carboplatin and Taxol x 6 from 08/02/2013 - 10/25/2013.  Course was complicated by a late pneumothorax secondary to port placement.  He also had a seizure in 03/2014 with subsequent fracture of lumbar vertebrae and rod placement.  PET scan on 04/12/2015 revealed mild soft tissue fullness along the posterior right upper lobe/perihilar region, with associated mild vague hypermetabolism (SUV 2.6).  There were hypermetabolic bilateral adrenal metastases, right (2.7 cm; SUV 10) greater than left (2.1 cm; SUV 7.8).  There was small hypermetabolic upper abdominal nodal metastases (9 mm porta hepatitis (SUV 7.3) and 7 mm portacaval node (SUV 5.3)).  CT guided right adrenal biopsy on 04/25/2015 revealed metastatic adenocarcinoma of lung primary.  TTF-1 was positive.  PD-L1 testing was negative (< 1%).  EGFR was negative.  ALK and ROS1 are pending.  He has a mild normocytic anemia.  Anemia work-up on 04/05/2015 revealed iron deficiency anemia (ferritin 48, iron saturation 12%, TIBC 310).  Sedimentation rate was 27.  B12 was 255 (low normal) with a folate 13.8 (normal).  MMA was normal (246).  TSH was 8.840 (high) with normal T4 and T3U, and free  thyroxine index.   His last colonoscopy was just prior to his diagnosis.  He has never had an EGD.  He denies any melena or hematochezia.  Diet is poor.  He is on oral iron.  He  received B12 on 05/18/2015.  He is on folic acid.  He is day 12 s/p cycle #1 carboplatin and Alimta  (05/25/2015).  He tolerated his chemotherapy well.  Counts are good.  Symptomatically, he remains anxious.  He has issues with chronic constipation.  Exam is stable.   Plan: 1.  Labs today:  CBC with diff, BMP. 2.  Review counts. 3.  Discuss consideration of addition of Avastin or Keytruda with carboplatin and Alimta.  Side effects of Avastin and Keytruda reviewed.  Reviewed data from recent FDA approval of combination of pembrolizumab Beryle Flock) with Alimta and carboplatin based on the KEYNOTE-021 trial. Response rates were 55% compared to 29% with chemotherapy alone. Progression free survival was 13 months with the addition of Keytruda versus 8.9 months with chemotherapy alone. Median time to response was 1.5 months with Keytruda compared to 2.7 months with chemotherapy alone. 4.  Follow-up pending molecular studies (ALK, ROS1). 5.  Discuss management of constipation. 6.  RTC on 06/15/2015 for MD assessment, labs (CBC with diff, CMP, Mg, CEA), and cycle #2 carboplatin and Alimta.   Lequita Asal, MD  06/05/2015, 10:42 AM

## 2015-06-05 NOTE — Progress Notes (Signed)
Pt reports that his upper mid left and right quadrant abdominal area has had increased pain.  Pt had a BM last Tuesday and hasn't went again until today and reports it is black or dark in color.  Pt reports a good amount of stool today and abdomen is still hard and feels bloated.  Pts wife reports pt as more edgy.

## 2015-06-08 ENCOUNTER — Telehealth: Payer: Self-pay | Admitting: *Deleted

## 2015-06-08 NOTE — Telephone Encounter (Signed)
Clled to report that he has not had BM in a week. Asking what he is to do, if he should get some more of " that medicine"

## 2015-06-08 NOTE — Telephone Encounter (Signed)
Per Dr Mike Gip, he has been given a regimen to follow and if he is impacted, he will need to go to ER. I called and spoke with Alvester Chou and asked if he is taking Miralax and stool softener daily and he replied "no, I didn't think I had to" I explained to him that he needs to take it every day unless he has diarrhea. I also advised him that he can try more Mag Citrate and if no BM he will need to go to ER to be disimpacted. He verbalized this back to me

## 2015-06-11 ENCOUNTER — Encounter: Payer: Self-pay | Admitting: Hematology and Oncology

## 2015-06-15 ENCOUNTER — Inpatient Hospital Stay: Payer: BLUE CROSS/BLUE SHIELD

## 2015-06-15 ENCOUNTER — Other Ambulatory Visit: Payer: Self-pay | Admitting: Hematology and Oncology

## 2015-06-15 ENCOUNTER — Inpatient Hospital Stay (HOSPITAL_BASED_OUTPATIENT_CLINIC_OR_DEPARTMENT_OTHER): Payer: BLUE CROSS/BLUE SHIELD | Admitting: Hematology and Oncology

## 2015-06-15 ENCOUNTER — Ambulatory Visit: Payer: BLUE CROSS/BLUE SHIELD

## 2015-06-15 ENCOUNTER — Ambulatory Visit
Admission: RE | Admit: 2015-06-15 | Discharge: 2015-06-15 | Disposition: A | Discharge status: Home / Self Care | Payer: BLUE CROSS/BLUE SHIELD | Source: Ambulatory Visit | Attending: Hematology and Oncology | Admitting: Hematology and Oncology

## 2015-06-15 ENCOUNTER — Telehealth: Payer: Self-pay | Admitting: *Deleted

## 2015-06-15 VITALS — BP 116/83 | HR 106 | Temp 96.1°F | Resp 17 | Ht 68.0 in | Wt 124.6 lb

## 2015-06-15 DIAGNOSIS — C7971 Secondary malignant neoplasm of right adrenal gland: Secondary | ICD-10-CM | POA: Insufficient documentation

## 2015-06-15 DIAGNOSIS — D509 Iron deficiency anemia, unspecified: Secondary | ICD-10-CM

## 2015-06-15 DIAGNOSIS — C771 Secondary and unspecified malignant neoplasm of intrathoracic lymph nodes: Secondary | ICD-10-CM | POA: Diagnosis not present

## 2015-06-15 DIAGNOSIS — Z79899 Other long term (current) drug therapy: Secondary | ICD-10-CM

## 2015-06-15 DIAGNOSIS — R1032 Left lower quadrant pain: Secondary | ICD-10-CM | POA: Insufficient documentation

## 2015-06-15 DIAGNOSIS — C3411 Malignant neoplasm of upper lobe, right bronchus or lung: Secondary | ICD-10-CM

## 2015-06-15 DIAGNOSIS — C7972 Secondary malignant neoplasm of left adrenal gland: Secondary | ICD-10-CM | POA: Diagnosis not present

## 2015-06-15 DIAGNOSIS — C7970 Secondary malignant neoplasm of unspecified adrenal gland: Secondary | ICD-10-CM

## 2015-06-15 DIAGNOSIS — F419 Anxiety disorder, unspecified: Secondary | ICD-10-CM

## 2015-06-15 DIAGNOSIS — C3490 Malignant neoplasm of unspecified part of unspecified bronchus or lung: Secondary | ICD-10-CM

## 2015-06-15 DIAGNOSIS — C7800 Secondary malignant neoplasm of unspecified lung: Secondary | ICD-10-CM

## 2015-06-15 DIAGNOSIS — Z923 Personal history of irradiation: Secondary | ICD-10-CM

## 2015-06-15 DIAGNOSIS — Z95828 Presence of other vascular implants and grafts: Secondary | ICD-10-CM

## 2015-06-15 LAB — COMPREHENSIVE METABOLIC PANEL
ALT: 34 U/L (ref 17–63)
AST: 29 U/L (ref 15–41)
Albumin: 3.9 g/dL (ref 3.5–5.0)
Alkaline Phosphatase: 98 U/L (ref 38–126)
Anion gap: 8 (ref 5–15)
BUN: 18 mg/dL (ref 6–20)
CO2: 25 mmol/L (ref 22–32)
Calcium: 9.2 mg/dL (ref 8.9–10.3)
Chloride: 104 mmol/L (ref 101–111)
Creatinine, Ser: 0.8 mg/dL (ref 0.61–1.24)
GFR calc Af Amer: >60 mL/min (ref >60)
GFR calc non Af Amer: >60 mL/min (ref >60)
Glucose, Bld: 115 mg/dL — ABNORMAL HIGH (ref 65–99)
Potassium: 3.6 mmol/L (ref 3.5–5.1)
Sodium: 137 mmol/L (ref 135–145)
Total Bilirubin: 0.4 mg/dL (ref 0.3–1.2)
Total Protein: 7.4 g/dL (ref 6.5–8.1)

## 2015-06-15 LAB — MAGNESIUM: Magnesium: 1.9 mg/dL (ref 1.7–2.4)

## 2015-06-15 LAB — CBC WITH DIFFERENTIAL/PLATELET
Basophils Absolute: 0 10*3/uL (ref 0–0.1)
Basophils Relative: 0 %
Eosinophils Absolute: 0 10*3/uL (ref 0–0.7)
Eosinophils Relative: 0 %
HCT: 35.5 % — ABNORMAL LOW (ref 40.0–52.0)
Hemoglobin: 12.1 g/dL — ABNORMAL LOW (ref 13.0–18.0)
Lymphocytes Relative: 14 %
Lymphs Abs: 0.8 10*3/uL — ABNORMAL LOW (ref 1.0–3.6)
MCH: 28.6 pg (ref 26.0–34.0)
MCHC: 34.1 g/dL (ref 32.0–36.0)
MCV: 83.8 fL (ref 80.0–100.0)
Monocytes Absolute: 0.7 10*3/uL (ref 0.2–1.0)
Monocytes Relative: 12 %
Neutro Abs: 4.3 10*3/uL (ref 1.4–6.5)
Neutrophils Relative %: 74 %
Platelets: 279 10*3/uL (ref 150–440)
RBC: 4.24 MIL/uL — ABNORMAL LOW (ref 4.40–5.90)
RDW: 15 % — ABNORMAL HIGH (ref 11.5–14.5)
WBC: 5.8 10*3/uL (ref 3.8–10.6)

## 2015-06-15 MED ORDER — HEPARIN SOD (PORK) LOCK FLUSH 100 UNIT/ML IV SOLN
500.0000 [IU] | Freq: Once | INTRAVENOUS | Status: AC
  Administered 2015-06-15: 500 [IU] via INTRAVENOUS

## 2015-06-15 MED ORDER — HEPARIN SOD (PORK) LOCK FLUSH 100 UNIT/ML IV SOLN
INTRAVENOUS | Status: AC
  Filled 2015-06-15: qty 5

## 2015-06-15 MED ORDER — SODIUM CHLORIDE 0.9% FLUSH
10.0000 mL | INTRAVENOUS | Status: AC | PRN
  Filled 2015-06-15: qty 10

## 2015-06-15 MED ORDER — IOPAMIDOL (ISOVUE-300) INJECTION 61%
85.0000 mL | Freq: Once | INTRAVENOUS | Status: AC | PRN
  Administered 2015-06-15: 85 mL via INTRAVENOUS

## 2015-06-15 NOTE — Progress Notes (Signed)
Pt reports lower abdominal pain that is different from the feeling of having to have a BM.  Pt reports chest pain that comes and goes.  Pt reports when chest pain comes it can make it difficult to breathe, however it can come at rest, and can be after eating.  Pt reports knot on back of right upper arm with a bruise where he tripped over a tree root Sunday.  Pt reports having nausea for 1 day on Monday or Tuesday.  Pt reports having BM everyday consistency.  Pt reports his breathing worse.

## 2015-06-15 NOTE — Progress Notes (Signed)
Darryl Hatfield day:  06/15/2015    Chief Complaint: Darryl [facility]field is a 55 y.o. 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  HPI:  The patient was last seen in the medical oncology [facility] on 06/05/2015.  At that time, he was seen on day 12 of cycle #1 carboplatin and Alimta.  He had issues with constipation.    During the interim, he has felt "alright".  He is having daily bowel movements.  He is taking Miralax daily.  He notes a bruise on his right arm and knee after tripping on a tree.  He notes off/on lower abdominal pain (level 10) not improved or exacerbated by any activity or food.  He discontinued his oral iron.  He denies any fever.  He has shortness of breath with exertion.   Past Medical History  Diagnosis Date  . COPD (chronic obstructive pulmonary disease) ([place])   . Lung cancer ([place]) 2015    RIght   . CHF (congestive heart failure) ([place])   . Seizures ([place])   . Hypothyroidism     Past Surgical History  Procedure Laterality Date  . Back surgery    . Portacath placement Right     Family History  Problem Relation Age of Onset  . Hypertension Other     Social History:  reports that he quit smoking about 1 years ago. He has never used smokeless tobacco. He reports that he does not drink alcohol or use illicit drugs.  He is on disability.  The patient is accompanied by his wife, [name],  today.  Allergies:  Allergies  Allergen Reactions  . No Known Allergies     Current Medications: Current Outpatient Prescriptions  Medication Sig Dispense Refill  . acetaminophen (TYLENOL) 325 MG tablet Take by mouth.    Marland Kitchen albuterol (PROVENTIL) (2.5 MG/3ML) 0.083% nebulizer solution Take 3 mLs (2.5 mg total) by nebulization every 6 (six) hours as needed for wheezing or shortness of breath. 75 mL 2  . buPROPion  (WELLBUTRIN SR) 200 MG 12 hr tablet Take 1 tablet (200 mg total) by mouth 2 (two) times daily. 60 tablet 2  . dexamethasone (DECADRON) 4 MG tablet Take 1 tab two times a day the day before Alimta chemo. Take 2 tabs two times a day starting the day after chemo for 3 days. 30 tablet 1  . ferrous sulfate 325 (65 FE) MG tablet Take 325 mg by mouth daily with breakfast.    . Fluticasone-Salmeterol (ADVAIR) 250-50 MCG/DOSE AEPB Inhale 1 puff into the lungs 2 (two) times daily. 60 each 2  . folic acid (FOLVITE) 1 MG tablet Take 1 tablet (1 mg total) by mouth daily. Start 5-7 days before Alimta chemotherapy. Continue until 21 days after Alimta completed. 100 tablet 3  . gabapentin (NEURONTIN) 100 MG capsule Take 1 capsule (100 mg total) by mouth 3 (three) times daily. 90 capsule 4  . levothyroxine (SYNTHROID, LEVOTHROID) 88 MCG tablet Take 88 mcg by mouth daily before breakfast.    . LORazepam (ATIVAN) 0.5 MG tablet Take 1 tablet (0.5 mg total) by mouth every 8 (eight) hours as needed for anxiety. 60 tablet 0  . ondansetron (ZOFRAN) 8 MG tablet Take 1 tablet (8 mg total) by mouth 2 (two) times daily as needed for refractory nausea / vomiting. Start on day 3 after  chemo. 30 tablet 1  . QUEtiapine (SEROQUEL) 25 MG tablet Take 1 tablet (25 mg total) by mouth at bedtime. 30 tablet 0  . tiotropium (SPIRIVA) 18 MCG inhalation capsule Place 18 mcg into inhaler and inhale daily.     No current facility-administered medications for this visit.   Facility-Administered Medications Ordered in Other Visits  Medication Dose Route Frequency Provider Last Rate Last Dose  . sodium chloride 0.9 % injection 10 mL  10 mL Intracatheter PRN Forest Gleason, MD   10 mL at 06/27/14 1338  . sodium chloride 0.9 % injection 10 mL  10 mL Intracatheter PRN Leia Alf, MD   10 mL at 08/22/14 0851    Review of Systems:  GENERAL:  Feels "alright".  No fevers or sweats.  Weight stable.  Maximum weight 135 pounds. PERFORMANCE STATUS  (ECOG):  2 HEENT:  No visual changes, runny nose, sore throat, mouth sores or tenderness. Lungs: Shortness of breath with exertion.  No cough.  No hemoptysis. Cardiac:  No chest pain, palpitations, orthopnea, or PND. GI:  Constipation resolved with Miralax.  Notes new lower abdominal pain.  No nausea, vomiting, diarrhea, melena or hematochezia.  No prior EGD.  Colonoscopy prior to diagnosis. GU:  No urgency, frequency, dysuria, or hematuria. Musculoskeletal:  Chronic back pain.  No joint pain.  No muscle tenderness. Extremities:  No pain or swelling. Skin:  No rashes or skin changes. Neuro:  No headache, numbness or weakness, balance or coordination issues. Endocrine:  No diabetes. On thyroid medication.  No hot flashes or night sweats. Psych:  High anxiety (stable).  No mood changes or depression. Pain:  No focal pain. Review of systems:  All other systems reviewed and found to be negative.  Physical Exam: Blood pressure 116/83, pulse 106, temperature 96.1 F (35.6 C), temperature source Tympanic, resp. rate 17, height _0  (1.727 m), weight 124 lb 9 oz (56.5 kg), SpO2 98 %. GENERAL:  Thin gentleman sitting comfortably in the exam room in no acute distress.  He is anxious. MENTAL STATUS:  Alert and oriented to person, place and time. HEAD:  Black hair with slight graying.  Mustache.  Temporal wasting.  Normocephalic, atraumatic, face symmetric, no Cushingoid features. EYES:  Glasses.  Blue eyes.  Pupils equal round and reactive to light and accomodation.  No conjunctivitis or scleral icterus. ENT:  Oropharynx clear without lesion.  Tongue normal. Mucous membranes moist.  RESPIRATORY:  Clear to auscultation without rales, wheezes or rhonchi. CARDIOVASCULAR:  Regular rate and rhythm without murmur, rub or gallop. ABDOMEN:  Soft, slightly tender in the left lower quadrant without guarding or rebound tenderness.  Active bowel sounds and no hepatosplenomegaly.  No masses. SKIN:  No rashes,  ulcers or lesions. EXTREMITIES: No edema, no skin discoloration or tenderness.  No palpable cords. LYMPH NODES: No palpable cervical, supraclavicular, axillary or inguinal adenopathy.  NEUROLOGICAL: Unremarkable. PSYCH:  Very anxious.    Infusion on 06/15/2015  Component Date Value Ref Range Status  . WBC 06/15/2015 5.8  3.8 - 10.6 K/uL Final  . RBC 06/15/2015 4.24* 4.40 - 5.90 MIL/uL Final  . Hemoglobin 06/15/2015 12.1* 13.0 - 18.0 g/dL Final  . HCT 06/15/2015 35.5* 40.0 - 52.0 % Final  . MCV 06/15/2015 83.8  80.0 - 100.0 fL Final  . MCH 06/15/2015 28.6  26.0 - 34.0 pg Final  . MCHC 06/15/2015 34.1  32.0 - 36.0 g/dL Final  . RDW 06/15/2015 15.0* 11.5 - 14.5 % Final  . Platelets 06/15/2015  279  150 - 440 K/uL Final  . Neutrophils Relative % 06/15/2015 74   Final  . Neutro Abs 06/15/2015 4.3  1.4 - 6.5 K/uL Final  . Lymphocytes Relative 06/15/2015 14   Final  . Lymphs Abs 06/15/2015 0.8* 1.0 - 3.6 K/uL Final  . Monocytes Relative 06/15/2015 12   Final  . Monocytes Absolute 06/15/2015 0.7  0.2 - 1.0 K/uL Final  . Eosinophils Relative 06/15/2015 0   Final  . Eosinophils Absolute 06/15/2015 0.0  0 - 0.7 K/uL Final  . Basophils Relative 06/15/2015 0   Final  . Basophils Absolute 06/15/2015 0.0  0 - 0.1 K/uL Final  . Sodium 06/15/2015 137  135 - 145 mmol/L Final  . Potassium 06/15/2015 3.6  3.5 - 5.1 mmol/L Final  . Chloride 06/15/2015 104  101 - 111 mmol/L Final  . CO2 06/15/2015 25  22 - 32 mmol/L Final  . Glucose, Bld 06/15/2015 115* 65 - 99 mg/dL Final  . BUN 06/15/2015 18  6 - 20 mg/dL Final  . Creatinine, Ser 06/15/2015 0.80  0.61 - 1.24 mg/dL Final  . Calcium 06/15/2015 9.2  8.9 - 10.3 mg/dL Final  . Total Protein 06/15/2015 7.4  6.5 - 8.1 g/dL Final  . Albumin 06/15/2015 3.9  3.5 - 5.0 g/dL Final  . AST 06/15/2015 29  15 - 41 U/L Final  . ALT 06/15/2015 34  17 - 63 U/L Final  . Alkaline Phosphatase 06/15/2015 98  38 - 126 U/L Final  . Total Bilirubin 06/15/2015 0.4  0.3 -  1.2 mg/dL Final  . GFR calc non Af Amer 06/15/2015 >60  >60 mL/min Final  . GFR calc Af Amer 06/15/2015 >60  >60 mL/min Final   Comment: (NOTE) The eGFR has been calculated using the CKD EPI equation. This calculation has not been validated in all clinical situations. eGFR's persistently <60 mL/min signify possible Chronic Kidney Disease.   . Anion gap 06/15/2015 8  5 - 15 Final  . Magnesium 06/15/2015 1.9  1.7 - 2.4 mg/dL Final    Assessment:  Darryl [facility] is a 55 y.o. 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    He was diagnosed with stage IIIA squamous cell lung cancer in 07/02/2013.  PET scan on 06/23/2013 revealed a 1.5 cm right upper lobe nodule (SUV 7.4) concerning for primary bronchogenic carcinoma.  There were hypermetabolic ipsilateral hilar and mediastinal lymph node metastasis. There was malignant range FDG uptake associated with bilateral level 2 cervical lymph nodes of uncertain significance.  EBUS assisted biopsy of a right lower paratracheal and right hilar lymph node on 07/02/2013 confirmed non-small cell carcinoma, favoring squamous cell carcinoma.  Clinical stage was T1N2M0.  He received concurrent chemotherapy and radiation.  He received 6,000 cGy from 08/05/2014 - 09/14/2013.  He received weekly carboplatin and Taxol x 6 from 08/02/2013 - 10/25/2013.  Course was complicated by a late pneumothorax secondary to port placement.  He also had a seizure in 03/2014 with subsequent fracture of lumbar vertebrae and rod placement.  PET scan on 04/12/2015 revealed mild soft tissue fullness along the posterior right upper lobe/perihilar region, with associated mild vague hypermetabolism (SUV 2.6).  There were hypermetabolic bilateral adrenal metastases, right (2.7 cm; SUV 10) greater than left (2.1 cm; SUV 7.8).  There was small hypermetabolic upper abdominal nodal metastases (9 mm porta hepatitis (SUV  7.3) and 7 mm portacaval node (SUV 5.3)).  CT guided right adrenal biopsy on 04/25/2015 revealed metastatic adenocarcinoma of lung primary.  TTF-1 was positive.  PD-L1 testing was negative (< 1%).  EGFR was negative.  ALK and ROS1 are pending.  He has a mild normocytic anemia.  Anemia work-up on 04/05/2015 revealed iron deficiency anemia (ferritin 48, iron saturation 12%, TIBC 310).  Sedimentation rate was 27.  B12 was 255 (low normal) with a folate 13.8 (normal).  MMA was normal (246).  TSH was 8.840 (high) with normal T4 and T3U, and free thyroxine index.   His last colonoscopy was just prior to his diagnosis.  He has never had an EGD.  He denies any melena or hematochezia.  Diet is poor.  He is on oral iron.  He received B12 on 05/18/2015.  He is on folic acid.  He is s/p cycle #1 carboplatin and Alimta  (05/25/2015).  He tolerated his chemotherapy well.   Symptomatically, he remains anxious.  Constipation has resolved on Miralax.  He has new intermittent left lower quadrant pain (? Diverticulosis).  Abdominal exam is benign.  Plan: 1.  Labs today:  CBC with diff, CMP, Mg, CEA. 2.  Discuss patient's thoughts about the addition of Avastin or Keytruda with carboplatin and Alimta.  Patient undecided.  He does not like potential side effects. 3.  Discuss abdominal pain.  Patient concerned about progressive disease.  Discussed extremely unlikely, but possible diverticulosis.  Patient would like to proceed with scan before treatment.  Discussed need to take Decadron today (prior to treatment tomorrow). 4.  Postpone chemotherapy today. 5.  Abdominal/pelvic CT scan with contrast. 6.  Follow-up pending molecular studies (ALK, ROS1). 7.  RTC tomorrow for cycle #2 carboplatin and Alimta. 8.  RTC in 3 weeks for MD assessment, labs (CBC with diff, CMP, Mg, CEA) and cycle #3 carboplatin and Alimta.  Addendum:  Abdominal/pelvic CT scan revealed no acute process in abdomen or pelvis.  Prominent appendix  without periappendiceal inflammation.  Adenal metastasis slightly larger compared to PET 04/12/2015 and CT 04/05/2015 (chemotherapy started 6 weeks later).   Lequita Asal, MD  06/15/2015, 10:18 AM

## 2015-06-15 NOTE — Telephone Encounter (Signed)
Called and gave report of ct-nothing showing in abdomen.

## 2015-06-16 ENCOUNTER — Other Ambulatory Visit: Payer: Self-pay | Admitting: *Deleted

## 2015-06-16 ENCOUNTER — Inpatient Hospital Stay: Payer: BLUE CROSS/BLUE SHIELD

## 2015-06-16 ENCOUNTER — Telehealth: Payer: Self-pay | Admitting: *Deleted

## 2015-06-16 VITALS — BP 129/84 | HR 83 | Temp 96.9°F | Resp 20

## 2015-06-16 DIAGNOSIS — C3490 Malignant neoplasm of unspecified part of unspecified bronchus or lung: Secondary | ICD-10-CM

## 2015-06-16 DIAGNOSIS — C3411 Malignant neoplasm of upper lobe, right bronchus or lung: Secondary | ICD-10-CM | POA: Diagnosis not present

## 2015-06-16 LAB — CEA: CEA: 34 ng/mL — ABNORMAL HIGH (ref 0.0–4.7)

## 2015-06-16 MED ORDER — LORAZEPAM 0.5 MG PO TABS: 0.5000 mg | ORAL_TABLET | Freq: Three times a day (TID) | ORAL | Status: DC | PRN

## 2015-06-16 MED ORDER — SODIUM CHLORIDE 0.9 % IV SOLN
10.0000 mg | Freq: Once | INTRAVENOUS | Status: AC
  Administered 2015-06-16: 10 mg via INTRAVENOUS
  Filled 2015-06-16: qty 1

## 2015-06-16 MED ORDER — SODIUM CHLORIDE 0.9 % IV SOLN
Freq: Once | INTRAVENOUS | Status: AC
  Administered 2015-06-16: 09:00:00 via INTRAVENOUS
  Filled 2015-06-16: qty 1000

## 2015-06-16 MED ORDER — PEMETREXED DISODIUM CHEMO INJECTION 500 MG
500.0000 mg/m2 | Freq: Once | INTRAVENOUS | Status: AC
  Administered 2015-06-16: 800 mg via INTRAVENOUS
  Filled 2015-06-16: qty 28

## 2015-06-16 MED ORDER — LEVOTHYROXINE SODIUM 88 MCG PO TABS: 88.0000 ug | ORAL_TABLET | Freq: Every day | ORAL | Status: DC

## 2015-06-16 MED ORDER — SODIUM CHLORIDE 0.9% FLUSH
10.0000 mL | INTRAVENOUS | Status: DC | PRN
  Administered 2015-06-16: 10 mL
  Filled 2015-06-16: qty 10

## 2015-06-16 MED ORDER — SODIUM CHLORIDE 0.9 % IV SOLN
528.5000 mg | Freq: Once | INTRAVENOUS | Status: AC
  Administered 2015-06-16: 530 mg via INTRAVENOUS
  Filled 2015-06-16: qty 53

## 2015-06-16 MED ORDER — PALONOSETRON HCL INJECTION 0.25 MG/5ML
0.2500 mg | Freq: Once | INTRAVENOUS | Status: AC
  Administered 2015-06-16: 0.25 mg via INTRAVENOUS
  Filled 2015-06-16: qty 5

## 2015-06-16 MED ORDER — LORAZEPAM 0.5 MG PO TABS
0.5000 mg | ORAL_TABLET | Freq: Three times a day (TID) | ORAL | Status: DC | PRN
Start: 2015-06-16 — End: 2015-12-05

## 2015-06-16 MED ORDER — HEPARIN SOD (PORK) LOCK FLUSH 100 UNIT/ML IV SOLN
500.0000 [IU] | Freq: Once | INTRAVENOUS | Status: AC | PRN
  Administered 2015-06-16: 500 [IU]
  Filled 2015-06-16: qty 5

## 2015-06-16 NOTE — Telephone Encounter (Signed)
Was told by md she would cover his thyroid med and anxiety med til they get set up with another PCP and rx given

## 2015-06-17 ENCOUNTER — Encounter: Payer: Self-pay | Admitting: Hematology and Oncology

## 2015-06-30 LAB — SURGICAL PATHOLOGY

## 2015-07-03 ENCOUNTER — Telehealth: Payer: Self-pay | Admitting: *Deleted

## 2015-07-03 NOTE — Telephone Encounter (Signed)
  Please call patient's wife and see if he is drinking fluids. Is he dehydrates?  Any nausea or vomiting or diarrhea?  M

## 2015-07-03 NOTE — Telephone Encounter (Signed)
Had a sudden onset of dizziness at 2 pm which is still ongoing. Having to lie down because of it. Denies N/V and reports that he has been eating and drinking fine. Please advise

## 2015-07-03 NOTE — Telephone Encounter (Signed)
I called Darryl [facility]field back and he states he is feeling a little better, but does remain dizzy. I asked if [name] or his daughter were there and he states both are working all evening. I advised that since he is feeling better to see how he does and if he gets worse to go to ER or an Urgent Care. He agreed with this plan

## 2015-07-04 ENCOUNTER — Encounter: Payer: Self-pay | Admitting: Pathology

## 2015-07-06 ENCOUNTER — Other Ambulatory Visit: Payer: Self-pay | Admitting: *Deleted

## 2015-07-06 ENCOUNTER — Ambulatory Visit: Payer: BLUE CROSS/BLUE SHIELD

## 2015-07-06 ENCOUNTER — Inpatient Hospital Stay: Payer: BLUE CROSS/BLUE SHIELD

## 2015-07-06 ENCOUNTER — Inpatient Hospital Stay (HOSPITAL_BASED_OUTPATIENT_CLINIC_OR_DEPARTMENT_OTHER): Payer: BLUE CROSS/BLUE SHIELD | Admitting: Hematology and Oncology

## 2015-07-06 ENCOUNTER — Inpatient Hospital Stay: Payer: BLUE CROSS/BLUE SHIELD | Attending: Hematology and Oncology

## 2015-07-06 ENCOUNTER — Other Ambulatory Visit: Payer: Self-pay | Admitting: Hematology and Oncology

## 2015-07-06 ENCOUNTER — Encounter: Payer: Self-pay | Admitting: Hematology and Oncology

## 2015-07-06 VITALS — BP 110/73 | HR 104 | Temp 97.1°F | Resp 18 | Ht 68.0 in | Wt 123.7 lb

## 2015-07-06 DIAGNOSIS — C3411 Malignant neoplasm of upper lobe, right bronchus or lung: Secondary | ICD-10-CM | POA: Diagnosis not present

## 2015-07-06 DIAGNOSIS — C7970 Secondary malignant neoplasm of unspecified adrenal gland: Secondary | ICD-10-CM

## 2015-07-06 DIAGNOSIS — L299 Pruritus, unspecified: Secondary | ICD-10-CM

## 2015-07-06 DIAGNOSIS — D509 Iron deficiency anemia, unspecified: Secondary | ICD-10-CM | POA: Insufficient documentation

## 2015-07-06 DIAGNOSIS — C7972 Secondary malignant neoplasm of left adrenal gland: Secondary | ICD-10-CM

## 2015-07-06 DIAGNOSIS — Z87891 Personal history of nicotine dependence: Secondary | ICD-10-CM | POA: Diagnosis not present

## 2015-07-06 DIAGNOSIS — Z79899 Other long term (current) drug therapy: Secondary | ICD-10-CM | POA: Diagnosis not present

## 2015-07-06 DIAGNOSIS — C7971 Secondary malignant neoplasm of right adrenal gland: Secondary | ICD-10-CM | POA: Diagnosis not present

## 2015-07-06 DIAGNOSIS — I509 Heart failure, unspecified: Secondary | ICD-10-CM | POA: Insufficient documentation

## 2015-07-06 DIAGNOSIS — C778 Secondary and unspecified malignant neoplasm of lymph nodes of multiple regions: Secondary | ICD-10-CM

## 2015-07-06 DIAGNOSIS — C7800 Secondary malignant neoplasm of unspecified lung: Secondary | ICD-10-CM | POA: Diagnosis not present

## 2015-07-06 DIAGNOSIS — E039 Hypothyroidism, unspecified: Secondary | ICD-10-CM | POA: Diagnosis not present

## 2015-07-06 DIAGNOSIS — R569 Unspecified convulsions: Secondary | ICD-10-CM | POA: Diagnosis not present

## 2015-07-06 DIAGNOSIS — J449 Chronic obstructive pulmonary disease, unspecified: Secondary | ICD-10-CM | POA: Insufficient documentation

## 2015-07-06 DIAGNOSIS — M545 Low back pain, unspecified: Secondary | ICD-10-CM

## 2015-07-06 DIAGNOSIS — C3490 Malignant neoplasm of unspecified part of unspecified bronchus or lung: Secondary | ICD-10-CM

## 2015-07-06 LAB — CBC WITH DIFFERENTIAL/PLATELET
Basophils Absolute: 0 10*3/uL (ref 0–0.1)
Basophils Relative: 0 %
Eosinophils Absolute: 0 10*3/uL (ref 0–0.7)
Eosinophils Relative: 0 %
HCT: 33.8 % — ABNORMAL LOW (ref 40.0–52.0)
Hemoglobin: 11.5 g/dL — ABNORMAL LOW (ref 13.0–18.0)
Lymphocytes Relative: 15 %
Lymphs Abs: 0.7 10*3/uL — ABNORMAL LOW (ref 1.0–3.6)
MCH: 29 pg (ref 26.0–34.0)
MCHC: 34 g/dL (ref 32.0–36.0)
MCV: 85.3 fL (ref 80.0–100.0)
Monocytes Absolute: 0.6 10*3/uL (ref 0.2–1.0)
Monocytes Relative: 12 %
Neutro Abs: 3.6 10*3/uL (ref 1.4–6.5)
Neutrophils Relative %: 73 %
Platelets: 242 10*3/uL (ref 150–440)
RBC: 3.96 MIL/uL — ABNORMAL LOW (ref 4.40–5.90)
RDW: 16.5 % — ABNORMAL HIGH (ref 11.5–14.5)
WBC: 4.9 10*3/uL (ref 3.8–10.6)

## 2015-07-06 LAB — COMPREHENSIVE METABOLIC PANEL
ALT: 19 U/L (ref 17–63)
AST: 23 U/L (ref 15–41)
Albumin: 3.8 g/dL (ref 3.5–5.0)
Alkaline Phosphatase: 80 U/L (ref 38–126)
Anion gap: 13 (ref 5–15)
BUN: 13 mg/dL (ref 6–20)
CO2: 24 mmol/L (ref 22–32)
Calcium: 9.3 mg/dL (ref 8.9–10.3)
Chloride: 103 mmol/L (ref 101–111)
Creatinine, Ser: 0.91 mg/dL (ref 0.61–1.24)
GFR calc Af Amer: >60 mL/min (ref >60)
GFR calc non Af Amer: >60 mL/min (ref >60)
Glucose, Bld: 130 mg/dL — ABNORMAL HIGH (ref 65–99)
Potassium: 3.6 mmol/L (ref 3.5–5.1)
Sodium: 140 mmol/L (ref 135–145)
Total Bilirubin: 0.5 mg/dL (ref 0.3–1.2)
Total Protein: 7.1 g/dL (ref 6.5–8.1)

## 2015-07-06 LAB — MAGNESIUM: Magnesium: 1.9 mg/dL (ref 1.7–2.4)

## 2015-07-06 MED ORDER — DIPHENHYDRAMINE HCL 25 MG PO TABS
25.0000 mg | ORAL_TABLET | Freq: Once | ORAL | Status: AC
  Administered 2015-07-06: 25 mg via ORAL
  Filled 2015-07-06: qty 1

## 2015-07-06 MED ORDER — SODIUM CHLORIDE 0.9% FLUSH
10.0000 mL | INTRAVENOUS | Status: DC | PRN
  Administered 2015-07-06: 10 mL via INTRAVENOUS
  Filled 2015-07-06: qty 10

## 2015-07-06 MED ORDER — SODIUM CHLORIDE 0.9 % IV SOLN
Freq: Once | INTRAVENOUS | Status: AC
Start: 2015-07-06 — End: 2015-07-06
  Administered 2015-07-06: 10:00:00 via INTRAVENOUS
  Filled 2015-07-06: qty 1000

## 2015-07-06 MED ORDER — SODIUM CHLORIDE 0.9 % IV SOLN
10.0000 mg | Freq: Once | INTRAVENOUS | Status: AC
  Administered 2015-07-06: 10 mg via INTRAVENOUS
  Filled 2015-07-06: qty 1

## 2015-07-06 MED ORDER — SODIUM CHLORIDE 0.9 % IV SOLN
530.0000 mg | Freq: Once | INTRAVENOUS | Status: AC
  Administered 2015-07-06: 530 mg via INTRAVENOUS
  Filled 2015-07-06: qty 53

## 2015-07-06 MED ORDER — CYANOCOBALAMIN 1000 MCG/ML IJ SOLN: 1000.0000 ug | Freq: Once | INTRAMUSCULAR | Status: DC

## 2015-07-06 MED ORDER — SODIUM CHLORIDE 0.9 % IV SOLN
500.0000 mg/m2 | Freq: Once | INTRAVENOUS | Status: AC
  Administered 2015-07-06: 800 mg via INTRAVENOUS
  Filled 2015-07-06: qty 28

## 2015-07-06 MED ORDER — HEPARIN SOD (PORK) LOCK FLUSH 100 UNIT/ML IV SOLN
500.0000 [IU] | Freq: Once | INTRAVENOUS | Status: AC
  Administered 2015-07-06: 500 [IU] via INTRAVENOUS
  Filled 2015-07-06: qty 5

## 2015-07-06 MED ORDER — PALONOSETRON HCL INJECTION 0.25 MG/5ML
0.2500 mg | Freq: Once | INTRAVENOUS | Status: AC
  Administered 2015-07-06: 0.25 mg via INTRAVENOUS
  Filled 2015-07-06: qty 5

## 2015-07-06 NOTE — Progress Notes (Signed)
Pt states he woke up with low back pain-took 2 tylenol and it is feeling some better.  Rating 5.  Constipation better after stopping iron pill, itching on his arms, chest, and back. Has some nausea-and took nausea med and it helped. Still coughing up sputum-sometimes light green but no fever and most of the time it is clear , to while, beige looking. No fever.

## 2015-07-06 NOTE — Progress Notes (Signed)
[facility] day:  07/06/2015    Chief Complaint: Darryl [facility] is a 55 y.o. 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  HPI:  The patient was last seen in the medical oncology clinic on 06/15/2015.  At that time,  He had unexplained abdominal pain.  Decision was made to postpone chemotherapy until after and abdominal and pelvic CT scan.  CT scan revealed no acute process in the abdomen or pelvis.  He received cycle #2 on 06/16/2015.  At last visit, we discussed the addition of Avastin or Keytruda with carboplatin and Alimta.  He was undecided.  He did not like the potential side effects.  Symptomatically, he notes lower to mid back pain which he thinks may be related to his mattress.  He has watery and itchy eyes.  He is off his iron pill.  His constipation is better.  He notes itchy arms, chest, and back.  Sputum is clear and sometimes white.  He has had no fever.   Past Medical History  Diagnosis Date  . COPD (chronic obstructive pulmonary disease) ([place])   . Lung cancer ([place]) 2015    RIght   . CHF (congestive heart failure) ([place])   . Seizures ([place])   . Hypothyroidism     Past Surgical History  Procedure Laterality Date  . Back surgery    . Portacath placement Right     Family History  Problem Relation Age of Onset  . Hypertension Other     Social History:  reports that he quit smoking about 2 years ago. He has never used smokeless tobacco. He reports that he does not drink alcohol or use illicit drugs.  He is on disability.  The patient is accompanied by his wife, [name],  today.  Allergies:  Allergies  Allergen Reactions  . No Known Allergies     Current Medications: Current Outpatient Prescriptions  Medication Sig Dispense Refill  . acetaminophen (TYLENOL) 325 MG tablet Take by mouth.    Marland Kitchen  albuterol (PROVENTIL) (2.5 MG/3ML) 0.083% nebulizer solution Take 3 mLs (2.5 mg total) by nebulization every 6 (six) hours as needed for wheezing or shortness of breath. 75 mL 2  . dexamethasone (DECADRON) 4 MG tablet Take 1 tab two times a day the day before Alimta chemo. Take 2 tabs two times a day starting the day after chemo for 3 days. 30 tablet 1  . DULoxetine (CYMBALTA) 30 MG capsule Take 30 mg by mouth daily.    . folic acid (FOLVITE) 1 MG tablet Take 1 tablet (1 mg total) by mouth daily. Start 5-7 days before Alimta chemotherapy. Continue until 21 days after Alimta completed. 100 tablet 3  . gabapentin (NEURONTIN) 100 MG capsule Take 1 capsule (100 mg total) by mouth 3 (three) times daily. 90 capsule 4  . levothyroxine (SYNTHROID, LEVOTHROID) 88 MCG tablet Take 1 tablet (88 mcg total) by mouth daily before breakfast. 30 tablet 0  . LORazepam (ATIVAN) 0.5 MG tablet Take 1 tablet (0.5 mg total) by mouth every 8 (eight) hours as needed for anxiety. 30 tablet 0  . ondansetron (ZOFRAN) 8 MG tablet Take 1 tablet (8 mg total) by mouth 2 (two) times daily as needed for refractory nausea / vomiting. Start on day 3 after chemo. 30 tablet 1  . QUEtiapine (SEROQUEL) 25 MG tablet Take  1 tablet (25 mg total) by mouth at bedtime. 30 tablet 0  . tiotropium (SPIRIVA) 18 MCG inhalation capsule Place 18 mcg into inhaler and inhale daily.     No current facility-administered medications for this visit.   Facility-Administered Medications Ordered in Other Visits  Medication Dose Route Frequency Provider Last Rate Last Dose  . heparin lock flush 100 unit/mL  500 Units Intravenous Once Lequita Asal, MD      . sodium chloride 0.9 % injection 10 mL  10 mL Intracatheter PRN Forest Gleason, MD   10 mL at 06/27/14 1338  . sodium chloride 0.9 % injection 10 mL  10 mL Intracatheter PRN Leia Alf, MD   10 mL at 08/22/14 0851  . sodium chloride flush (NS) 0.9 % injection 10 mL  10 mL Intravenous PRN Lequita Asal, MD      . sodium chloride flush (NS) 0.9 % injection 10 mL  10 mL Intravenous PRN Lequita Asal, MD   10 mL at 07/06/15 0840    Review of Systems:  GENERAL:  Feels "the same".  No fevers or sweats.  Weight down 1 pound.  Maximum weight 135 pounds. PERFORMANCE STATUS (ECOG):  2 HEENT:  Itchy, watery eyes.  No visual changes, runny nose, sore throat, mouth sores or tenderness. Lungs: Shortness of breath with exertion.  No cough.  Sputum clear and white.  No hemoptysis. Cardiac:  No chest pain, palpitations, orthopnea, or PND. GI:  Constipation better off oral iron.  No nausea, vomiting, diarrhea, melena or hematochezia.  No prior EGD.  Colonoscopy prior to diagnosis. GU:  No urgency, frequency, dysuria, or hematuria. Musculoskeletal:  Lower to mid back pain.  No joint pain.  No muscle tenderness. Extremities:  No pain or swelling. Skin:  Itchy arms, chest, and back.  No rashes or skin changes. Neuro:  No headache, numbness or weakness, balance or coordination issues. Endocrine:  No diabetes. On thyroid medication.  No hot flashes or night sweats. Psych:  High anxiety (stable).  No mood changes or depression. Pain:  No focal pain. Review of systems:  All other systems reviewed and found to be negative.  Physical Exam: Blood pressure 110/73, pulse 104, temperature 97.1 F (36.2 C), temperature source Tympanic, resp. rate 18, height 5' 8"  (1.727 m), weight 123 lb 10.9 oz (56.1 kg). GENERAL:  Thin gentleman sitting comfortably in the exam room in no acute distress.  He is anxious. MENTAL STATUS:  Alert and oriented to person, place and time. HEAD:  Black hair with slight graying.  Mustache.  Temporal wasting.  Normocephalic, atraumatic, face symmetric, no Cushingoid features. EYES:  Glasses.  Blue eyes.  Pupils equal round and reactive to light and accomodation.  No conjunctivitis or scleral icterus. ENT:  Oropharynx clear without lesion.  Tongue normal. Mucous membranes moist.   RESPIRATORY:  Clear to auscultation without rales, wheezes or rhonchi. CARDIOVASCULAR:  Regular rate and rhythm without murmur, rub or gallop. ABDOMEN:  Soft, non-tender with active bowel sounds and no hepatosplenomegaly.  No masses. BACK:  Slightly tender low back. SKIN:  Excoriations on arms.  No rashes, ulcers or lesions. EXTREMITIES: No edema, no skin discoloration or tenderness.  No palpable cords. LYMPH NODES: No palpable cervical, supraclavicular, axillary or inguinal adenopathy.  NEUROLOGICAL: Unremarkable. PSYCH:  Very anxious.    Infusion on 07/06/2015  Component Date Value Ref Range Status  . WBC 07/06/2015 4.9  3.8 - 10.6 K/uL Final  . RBC 07/06/2015 3.96* 4.40 -  5.90 MIL/uL Final  . Hemoglobin 07/06/2015 11.5* 13.0 - 18.0 g/dL Final  . HCT 07/06/2015 33.8* 40.0 - 52.0 % Final  . MCV 07/06/2015 85.3  80.0 - 100.0 fL Final  . MCH 07/06/2015 29.0  26.0 - 34.0 pg Final  . MCHC 07/06/2015 34.0  32.0 - 36.0 g/dL Final  . RDW 07/06/2015 16.5* 11.5 - 14.5 % Final  . Platelets 07/06/2015 242  150 - 440 K/uL Final  . Neutrophils Relative % 07/06/2015 73   Final  . Neutro Abs 07/06/2015 3.6  1.4 - 6.5 K/uL Final  . Lymphocytes Relative 07/06/2015 15   Final  . Lymphs Abs 07/06/2015 0.7* 1.0 - 3.6 K/uL Final  . Monocytes Relative 07/06/2015 12   Final  . Monocytes Absolute 07/06/2015 0.6  0.2 - 1.0 K/uL Final  . Eosinophils Relative 07/06/2015 0   Final  . Eosinophils Absolute 07/06/2015 0.0  0 - 0.7 K/uL Final  . Basophils Relative 07/06/2015 0   Final  . Basophils Absolute 07/06/2015 0.0  0 - 0.1 K/uL Final    Assessment:  IMER FOXWORTH is a 55 y.o. 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    He was diagnosed with stage IIIA squamous cell lung cancer in 07/02/2013.  PET scan on 06/23/2013 revealed a 1.5 cm right upper lobe nodule (SUV 7.4) concerning for primary bronchogenic carcinoma.  There were  hypermetabolic ipsilateral hilar and mediastinal lymph node metastasis. There was malignant range FDG uptake associated with bilateral level 2 cervical lymph nodes of uncertain significance.  EBUS assisted biopsy of a right lower paratracheal and right hilar lymph node on 07/02/2013 confirmed non-small cell carcinoma, favoring squamous cell carcinoma.  Clinical stage was T1N2M0.  He received concurrent chemotherapy and radiation.  He received 6,000 cGy from 08/05/2014 - 09/14/2013.  He received weekly carboplatin and Taxol x 6 from 08/02/2013 - 10/25/2013.  Course was complicated by a late pneumothorax secondary to port placement.  He also had a seizure in 03/2014 with subsequent fracture of lumbar vertebrae and rod placement.  PET scan on 04/12/2015 revealed mild soft tissue fullness along the posterior right upper lobe/perihilar region, with associated mild vague hypermetabolism (SUV 2.6).  There were hypermetabolic bilateral adrenal metastases, right (2.7 cm; SUV 10) greater than left (2.1 cm; SUV 7.8).  There was small hypermetabolic upper abdominal nodal metastases (9 mm porta hepatitis (SUV 7.3) and 7 mm portacaval node (SUV 5.3)).  CT guided right adrenal biopsy on 04/25/2015 revealed metastatic adenocarcinoma of lung primary.  TTF-1 was positive.  PD-L1 testing was negative (< 1%).  EGFR revealed no mutation.  There was no ALK rearrangement.  ROS1 revealed no result (unsuccessful).  RET gene rearrangement was negative.  CEA was 36.5 on 05/25/2015, 34 on 06/15/2015, and 35.3 on 07/06/2015.  He has a mild normocytic anemia.  Anemia work-up on 04/05/2015 revealed iron deficiency anemia (ferritin 48, iron saturation 12%, TIBC 310).  Sedimentation rate was 27.  B12 was 255 (low normal) with a folate 13.8 (normal).  MMA was normal (246).  TSH was 8.840 (high) with normal T4 and T3U, and free thyroxine index.   His last colonoscopy was just prior to his diagnosis.  He has never had an EGD.  He denies any  melena or hematochezia.  Diet is poor.  He is on oral iron.  He received B12 on 05/18/2015.  He is on folic acid.  He is  s/p 2 cycles of carboplatin and Alimta  (05/25/2015 - 06/16/2015).  He is tolerating his chemotherapy well.   Symptomatically, he remains anxious.  Constipation has resolved off oral iron.  He has itchy eyes and skin.  He has low back pain today.  Plan: 1.  Labs today:  CBC with diff, CMP, Mg, CEA. 2.  Cycle #3 carboplatin and Alimta. 3.  Discuss bone scan if back pain persists. 4.  Discuss symptomatic management of pruritus.  Doubt chemotherapy related. 5.  B12 on 07/20/2015 6.  RTC in 3 weeks for MD assess, labs (CBC with diff, CMP, Mg) and cycle #4 carboplatin and Alimta   Lequita Asal, MD  07/06/2015, 9:26 AM

## 2015-07-07 ENCOUNTER — Other Ambulatory Visit: Payer: Self-pay | Admitting: Family Medicine

## 2015-07-07 LAB — CEA: CEA: 35.3 ng/mL — ABNORMAL HIGH (ref 0.0–4.7)

## 2015-07-08 ENCOUNTER — Emergency Department
Admission: EM | Admit: 2015-07-08 | Discharge: 2015-07-08 | Disposition: A | Discharge status: Home / Self Care | Payer: BLUE CROSS/BLUE SHIELD | Attending: Emergency Medicine | Admitting: Emergency Medicine

## 2015-07-08 ENCOUNTER — Other Ambulatory Visit: Payer: Self-pay

## 2015-07-08 ENCOUNTER — Encounter: Payer: Self-pay | Admitting: Emergency Medicine

## 2015-07-08 ENCOUNTER — Emergency Department: Payer: BLUE CROSS/BLUE SHIELD

## 2015-07-08 DIAGNOSIS — Z7952 Long term (current) use of systemic steroids: Secondary | ICD-10-CM | POA: Diagnosis not present

## 2015-07-08 DIAGNOSIS — Z85118 Personal history of other malignant neoplasm of bronchus and lung: Secondary | ICD-10-CM | POA: Diagnosis not present

## 2015-07-08 DIAGNOSIS — R55 Syncope and collapse: Secondary | ICD-10-CM | POA: Insufficient documentation

## 2015-07-08 DIAGNOSIS — J449 Chronic obstructive pulmonary disease, unspecified: Secondary | ICD-10-CM | POA: Diagnosis not present

## 2015-07-08 DIAGNOSIS — Z79899 Other long term (current) drug therapy: Secondary | ICD-10-CM | POA: Diagnosis not present

## 2015-07-08 DIAGNOSIS — Z87891 Personal history of nicotine dependence: Secondary | ICD-10-CM | POA: Diagnosis not present

## 2015-07-08 DIAGNOSIS — I509 Heart failure, unspecified: Secondary | ICD-10-CM | POA: Insufficient documentation

## 2015-07-08 DIAGNOSIS — E039 Hypothyroidism, unspecified: Secondary | ICD-10-CM | POA: Insufficient documentation

## 2015-07-08 DIAGNOSIS — Z8669 Personal history of other diseases of the nervous system and sense organs: Secondary | ICD-10-CM | POA: Insufficient documentation

## 2015-07-08 LAB — URINALYSIS COMPLETE WITH MICROSCOPIC (ARMC ONLY)
BACTERIA UA: NONE SEEN
Bilirubin Urine: NEGATIVE
Glucose, UA: NEGATIVE mg/dL
HGB URINE DIPSTICK: NEGATIVE
KETONES UR: NEGATIVE mg/dL
LEUKOCYTES UA: NEGATIVE
NITRITE: NEGATIVE
PH: 7 (ref 5.0–8.0)
PROTEIN: NEGATIVE mg/dL
SPECIFIC GRAVITY, URINE: 1.001 — AB (ref 1.005–1.030)
Squamous Epithelial / LPF: NONE SEEN
WBC, UA: NONE SEEN WBC/hpf (ref 0–5)

## 2015-07-08 LAB — BASIC METABOLIC PANEL
Anion gap: 9 (ref 5–15)
BUN: 17 mg/dL (ref 6–20)
CALCIUM: 9.6 mg/dL (ref 8.9–10.3)
CO2: 30 mmol/L (ref 22–32)
CREATININE: 0.83 mg/dL (ref 0.61–1.24)
Chloride: 98 mmol/L — ABNORMAL LOW (ref 101–111)
GFR calc Af Amer: >60 mL/min (ref >60)
GLUCOSE: 81 mg/dL (ref 65–99)
Potassium: 4.4 mmol/L (ref 3.5–5.1)
SODIUM: 137 mmol/L (ref 135–145)

## 2015-07-08 LAB — CBC
HCT: 36.4 % — ABNORMAL LOW (ref 40.0–52.0)
Hemoglobin: 12.3 g/dL — ABNORMAL LOW (ref 13.0–18.0)
MCH: 28.8 pg (ref 26.0–34.0)
MCHC: 33.7 g/dL (ref 32.0–36.0)
MCV: 85.4 fL (ref 80.0–100.0)
PLATELETS: 272 10*3/uL (ref 150–440)
RBC: 4.26 MIL/uL — ABNORMAL LOW (ref 4.40–5.90)
RDW: 17.2 % — AB (ref 11.5–14.5)
WBC: 8.5 10*3/uL (ref 3.8–10.6)

## 2015-07-08 MED ORDER — SODIUM CHLORIDE 0.9 % IV BOLUS (SEPSIS)
500.0000 mL | Freq: Once | INTRAVENOUS | Status: AC
  Administered 2015-07-08: 500 mL via INTRAVENOUS

## 2015-07-08 NOTE — ED Provider Notes (Signed)
Time Seen: Approximately 1240 I have reviewed the triage notes  Chief Complaint: Dizziness; Nausea; and Excessive Sweating   History of Present Illness: Darryl Hatfield is a 55 y.o. male *who was transported here by EMS for evaluation of some near syncopal episodes. Patient states yesterday he actually his symptoms felt worse where he felt very lightheaded. He denies any significant chest pain, heart palpitations. He denies any loss of consciousness. 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 Patient states he had his second treatment on Thursday and started feeling the symptoms on Friday. He denies any nausea, vomiting, diarrhea. He denies any leg pain or swelling. He denies any arm, jaw, neck pain.   Past Medical History  Diagnosis Date  . COPD (chronic obstructive pulmonary disease) ([place])   . Lung cancer ([place]) 2015    RIght   . CHF (congestive heart failure) ([place])   . Seizures ([place])   . Hypothyroidism     Patient Active Problem List   Diagnosis Date Noted  . Constipation 06/05/2015  . Anxiety 05/12/2015  . Weight loss 05/12/2015  . 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 Adenocarcinoma of lung, stage 4 ([place]) 04/25/2015  . Iron deficiency anemia 04/05/2015  . Episodes of formed visual hallucinations 12/26/2014  . Dementia with behavioral disturbance 12/26/2014  . Delirium 12/25/2014  . Cancer of upper lobe of right lung ([place]) 07/02/2013  . SCIATICA 03/01/2010    Past Surgical History  Procedure Laterality Date  . Back surgery    . Portacath placement Right     Past Surgical History  Procedure Laterality Date  . Back surgery    . Portacath placement Right     Current Outpatient Rx  Name  Route  Sig  Dispense  Refill  . acetaminophen (TYLENOL) 325 MG tablet   Oral   Take by mouth.         Marland Kitchen albuterol (PROVENTIL) (2.5 MG/3ML) 0.083% nebulizer solution   Nebulization   Take 3 mLs (2.5 mg total) by  nebulization every 6 (six) hours as needed for wheezing or shortness of breath.   75 mL   2   . dexamethasone (DECADRON) 4 MG tablet      Take 1 tab two times a day the day before Alimta chemo. Take 2 tabs two times a day starting the day after chemo for 3 days.   30 tablet   1   . DULoxetine (CYMBALTA) 30 MG capsule   Oral   Take 30 mg by mouth daily.         . folic acid (FOLVITE) 1 MG tablet   Oral   Take 1 tablet (1 mg total) by mouth daily. Start 5-7 days before Alimta chemotherapy. Continue until 21 days after Alimta completed.   100 tablet   3   . gabapentin (NEURONTIN) 100 MG capsule   Oral   Take 1 capsule (100 mg total) by mouth 3 (three) times daily.   90 capsule   4   . levothyroxine (SYNTHROID, LEVOTHROID) 88 MCG tablet   Oral   Take 1 tablet (88 mcg total) by mouth daily before breakfast.   30 tablet   0   . LORazepam (ATIVAN) 0.5 MG tablet   Oral   Take 1 tablet (0.5 mg total) by mouth every 8 (eight) hours as needed for anxiety.   30 tablet   0   . ondansetron (ZOFRAN) 8 MG tablet  Oral   Take 1 tablet (8 mg total) by mouth 2 (two) times daily as needed for refractory nausea / vomiting. Start on day 3 after chemo.   30 tablet   1   . QUEtiapine (SEROQUEL) 25 MG tablet   Oral   Take 1 tablet (25 mg total) by mouth at bedtime.   30 tablet   0   . tiotropium (SPIRIVA) 18 MCG inhalation capsule   Inhalation   Place 18 mcg into inhaler and inhale daily.           Allergies:  No known allergies  Family History: Family History  Problem Relation Age of Onset  . Hypertension Other     Social History: Social History  Substance Use Topics  . Smoking status: Former Smoker -- 1.00 packs/day for 40 years    Quit date: 06/20/2013  . Smokeless tobacco: Never Used  . Alcohol Use: No     Review of Systems:   10 point review of systems was performed and was otherwise negative:  Constitutional: No fever Eyes: No visual  disturbances ENT: No sore throat, ear pain Cardiac: No chest pain Respiratory: No shortness of breath, wheezing, or stridor Abdomen: No abdominal pain, no vomiting, No diarrhea Endocrine: No weight loss, No night sweats Extremities: No peripheral edema, cyanosis Skin: No rashes, easy bruising Neurologic: No focal weakness, trouble with speech or swollowing Urologic: No dysuria, Hematuria, or urinary frequency   Physical Exam:  ED Triage Vitals  Enc Vitals Group     BP 07/08/15 1107 137/90 mmHg     Pulse Rate 07/08/15 1107 102     Resp 07/08/15 1107 18     Temp 07/08/15 1107 97.7 F (36.5 C)     Temp Source 07/08/15 1107 Oral     SpO2 07/08/15 1103 100 %     Weight 07/08/15 1107 123 lb (55.792 kg)     Height 07/08/15 1107 '5\' 8"'$  (1.727 m)     Head Cir --      Peak Flow --      Pain Score 07/08/15 1108 0     Pain Loc --      Pain Edu? --      Excl. in [place]? --     General: Awake , Alert , and Oriented times 3; GCS 15 Anxious Head: Normal cephalic , atraumatic Eyes: Pupils equal , round, reactive to light 6-4 mm stricture Nose/Throat: No nasal drainage, patent upper airway without erythema or exudate.  Neck: Supple, Full range of motion, No anterior adenopathy or palpable thyroid masses Lungs: Clear to ascultation without wheezes , rhonchi, or rales Heart: Regular rate, regular rhythm without murmurs , gallops , or rubs Abdomen: Soft, non tender without rebound, guarding , or rigidity; bowel sounds positive and symmetric in all 4 quadrants. No organomegaly .        Extremities: 2 plus symmetric pulses. No edema, clubbing or cyanosis Neurologic: normal ambulation, Motor symmetric without deficits, sensory intact Skin: warm, dry, no rashes   Labs:   All laboratory work was reviewed including any pertinent negatives or positives listed below:  Labs Reviewed  BASIC METABOLIC PANEL - Abnormal; Notable for the following:    Chloride 98 (*)    All other components within normal  limits  CBC - Abnormal; Notable for the following:    RBC 4.26 (*)    Hemoglobin 12.3 (*)    HCT 36.4 (*)    RDW 17.2 (*)    All other  components within normal limits  URINALYSIS COMPLETEWITH MICROSCOPIC ([facility] ONLY) - Abnormal; Notable for the following:    Color, Urine COLORLESS (*)    APPearance CLEAR (*)    Specific Gravity, Urine 1.001 (*)    All other components within normal limits  Laboratory work was reviewed and showed no clinically significant abnormalities.   EKG:  ED ECG REPORT I, Daymon Larsen, the attending physician, personally viewed and interpreted this ECG.  Date: 07/08/2015 EKG Time: 1103 Rate: 93 Rhythm: normal sinus rhythm QRS Axis: normal Intervals: normal ST/T Wave abnormalities: normal Conduction Disturbances: none Narrative Interpretation: unremarkable Diffuse early repolarization with early right bundle branch block No significant change from prior  Radiology:    DG Chest 2 View (Final result) Result time: 07/08/15 12:07:43   Final result by Rad Results In Interface (07/08/15 12:07:43)   Narrative:   CLINICAL DATA: Productive cough.  EXAM: CHEST 2 VIEW  COMPARISON: Radiograph of March 29, 2015.  FINDINGS: The heart size and mediastinal contours are within normal limits. Right internal jugular Port-A-Cath is unchanged with distal tip in expected position of cavoatrial junction. Left lung is clear. No pneumothorax or pleural effusion is noted. Stable right perihilar and upper lobe densities are noted. Bony thorax is unremarkable.  IMPRESSION: Stable right perihilar and upper lobe densities are noted concerning for scarring or possibly residual tumor. No significant changes noted compared to prior exam.   Electronically Signed By: Marijo Conception, M.D. On: 07/08/2015 12:07          I personally reviewed the radiologic studies   PED Course: * Patient was given a bolus of IV fluids for what appears to be in  non-syncopal episode. He denies any vertiginous symptoms, chest pain, shortness of breath, focal neurologic symptoms, fever, etc. Thus far his objective studies are within normal limits. He has some early repolarization on his EKG but no acute ischemic changes noted. The bundle branch block appears to be old with associated ischemic changes. The patient has no chest pain or shortness of breath and I felt was unlikely to have acute coronary syndrome or significant arrhythmia and/or pulmonary embolism. Patient does appear anxious and is process of switching his antidepressant medication. The patient was advised to take his Ativan at home as needed and continue to drink fluids. He states he started drinking extra fluids yesterday after the first episode. The patient appears to be of understanding and I felt he could be treated on an outpatient basis with close follow-up with his oncologist.   Assessment: * Near syncope  Final Clinical Impression:  Final diagnoses:  Syncope, near     Plan: * Outpatient Patient was advised to return immediately if condition worsens. Patient was advised to follow up with their primary care physician or other specialized physicians involved in their outpatient care. The patient and/or family member/power of attorney had laboratory results reviewed at the bedside. All questions and concerns were addressed and appropriate discharge instructions were distributed by the nursing staff.            Daymon Larsen, MD 07/08/15 1539

## 2015-07-08 NOTE — ED Notes (Signed)
Pt [place] via EMS from fire station that [place] from his home.  Pt states he had his 3rd CA treatment that is new for the second round on Thursday and started feeling bad yesterday. Pt states he felt worse yesterday than today.  Pt has hx lung CA, CHF, COPD.  Pt states he is feeling sweaty and c/o feeling "swimmy headed" Pt also states he switched from Wellbutrin to Cymbalta last week. Pt c/o feeling weak as well.  Other c/o pt has had some burning with urination and has had increased frequency.

## 2015-07-08 NOTE — Discharge Instructions (Signed)
Near-Syncope °Near-syncope (commonly known as near fainting) is sudden weakness, dizziness, or feeling like you might pass out. During an episode of near-syncope, you may also develop pale skin, have tunnel vision, or feel sick to your stomach (nauseous). Near-syncope may occur when getting up after sitting or while standing for a long time. It is caused by a sudden decrease in blood flow to the brain. This decrease can result from various causes or triggers, most of which are not serious. However, because near-syncope can sometimes be a sign of something serious, a medical evaluation is required. The specific cause is often not determined. °HOME CARE INSTRUCTIONS  °Monitor your condition for any changes. The following actions may help to alleviate any discomfort you are experiencing: °· Have someone stay with you until you feel stable. °· Lie down right away and prop your feet up if you start feeling like you might faint. Breathe deeply and steadily. Wait until all the symptoms have passed. Most of these episodes last only a few minutes. You may feel tired for several hours.   °· Drink enough fluids to keep your urine clear or pale yellow.   °· If you are taking blood pressure or heart medicine, get up slowly when seated or lying down. Take several minutes to sit and then stand. This can reduce dizziness. °· Follow up with your health care provider as directed.  °SEEK IMMEDIATE MEDICAL CARE IF:  °· You have a severe headache.   °· You have unusual pain in the chest, abdomen, or back.   °· You are bleeding from the mouth or rectum, or you have black or tarry stool.   °· You have an irregular or very fast heartbeat.   °· You have repeated fainting or have seizure-like jerking during an episode.   °· You faint when sitting or lying down.   °· You have confusion.   °· You have difficulty walking.   °· You have severe weakness.   °· You have vision problems.   °MAKE SURE YOU:  °· Understand these instructions. °· Will  watch your condition. °· Will get help right away if you are not doing well or get worse. °  °This information is not intended to replace advice given to you by your health care provider. Make sure you discuss any questions you have with your health care provider. °  °Document Released: 01/07/2005 Document Revised: 01/12/2013 Document Reviewed: 06/12/2012 °Elsevier Interactive Patient Education ©2016 [employer] ° °Please return immediately if condition worsens. Please contact her primary physician or the physician you were given for referral. If you have any specialist physicians involved in her treatment and plan please also contact them. Thank you for using [facility]. ° °

## 2015-07-10 ENCOUNTER — Telehealth: Payer: Self-pay | Admitting: *Deleted

## 2015-07-10 NOTE — Telephone Encounter (Signed)
Called to report that he had not been taking his post chemo pills right, he had only been taking one but she has now corrected that. Just wanted you to know that

## 2015-07-10 NOTE — Telephone Encounter (Signed)
  Just to confirm with the patient:  He should take Decadron 4 mg BID the day before and after treatment.  M

## 2015-07-13 ENCOUNTER — Other Ambulatory Visit: Payer: Self-pay | Admitting: Hematology and Oncology

## 2015-07-13 NOTE — Telephone Encounter (Signed)
  Synthroid is prescribed by his PCP.  M

## 2015-07-19 ENCOUNTER — Other Ambulatory Visit: Payer: Self-pay | Admitting: Hematology and Oncology

## 2015-07-20 ENCOUNTER — Inpatient Hospital Stay: Payer: BLUE CROSS/BLUE SHIELD

## 2015-07-20 DIAGNOSIS — C3490 Malignant neoplasm of unspecified part of unspecified bronchus or lung: Secondary | ICD-10-CM

## 2015-07-20 DIAGNOSIS — C3411 Malignant neoplasm of upper lobe, right bronchus or lung: Secondary | ICD-10-CM | POA: Diagnosis not present

## 2015-07-20 MED ORDER — CYANOCOBALAMIN 1000 MCG/ML IJ SOLN
1000.0000 ug | Freq: Once | INTRAMUSCULAR | Status: AC
  Administered 2015-07-20: 1000 ug via INTRAMUSCULAR
  Filled 2015-07-20: qty 1

## 2015-07-27 ENCOUNTER — Encounter: Payer: Self-pay | Admitting: Hematology and Oncology

## 2015-07-27 ENCOUNTER — Inpatient Hospital Stay: Payer: BLUE CROSS/BLUE SHIELD | Attending: Hematology and Oncology

## 2015-07-27 ENCOUNTER — Inpatient Hospital Stay (HOSPITAL_BASED_OUTPATIENT_CLINIC_OR_DEPARTMENT_OTHER): Payer: BLUE CROSS/BLUE SHIELD | Admitting: Hematology and Oncology

## 2015-07-27 ENCOUNTER — Other Ambulatory Visit: Payer: Self-pay | Admitting: Hematology and Oncology

## 2015-07-27 ENCOUNTER — Other Ambulatory Visit: Payer: Self-pay

## 2015-07-27 ENCOUNTER — Inpatient Hospital Stay: Payer: BLUE CROSS/BLUE SHIELD

## 2015-07-27 VITALS — BP 120/76 | HR 101 | Temp 97.7°F | Ht 68.0 in | Wt 126.2 lb

## 2015-07-27 VITALS — BP 123/75 | HR 89 | Temp 97.4°F | Resp 18

## 2015-07-27 DIAGNOSIS — E569 Vitamin deficiency, unspecified: Secondary | ICD-10-CM | POA: Insufficient documentation

## 2015-07-27 DIAGNOSIS — C3411 Malignant neoplasm of upper lobe, right bronchus or lung: Secondary | ICD-10-CM

## 2015-07-27 DIAGNOSIS — Z79899 Other long term (current) drug therapy: Secondary | ICD-10-CM

## 2015-07-27 DIAGNOSIS — J449 Chronic obstructive pulmonary disease, unspecified: Secondary | ICD-10-CM | POA: Diagnosis not present

## 2015-07-27 DIAGNOSIS — D509 Iron deficiency anemia, unspecified: Secondary | ICD-10-CM | POA: Insufficient documentation

## 2015-07-27 DIAGNOSIS — E039 Hypothyroidism, unspecified: Secondary | ICD-10-CM | POA: Diagnosis not present

## 2015-07-27 DIAGNOSIS — M545 Low back pain: Secondary | ICD-10-CM | POA: Insufficient documentation

## 2015-07-27 DIAGNOSIS — G8929 Other chronic pain: Secondary | ICD-10-CM

## 2015-07-27 DIAGNOSIS — C7800 Secondary malignant neoplasm of unspecified lung: Secondary | ICD-10-CM | POA: Diagnosis not present

## 2015-07-27 DIAGNOSIS — C7971 Secondary malignant neoplasm of right adrenal gland: Secondary | ICD-10-CM | POA: Insufficient documentation

## 2015-07-27 DIAGNOSIS — Z87891 Personal history of nicotine dependence: Secondary | ICD-10-CM | POA: Insufficient documentation

## 2015-07-27 DIAGNOSIS — L299 Pruritus, unspecified: Secondary | ICD-10-CM | POA: Diagnosis not present

## 2015-07-27 DIAGNOSIS — C3490 Malignant neoplasm of unspecified part of unspecified bronchus or lung: Secondary | ICD-10-CM

## 2015-07-27 DIAGNOSIS — E038 Other specified hypothyroidism: Secondary | ICD-10-CM

## 2015-07-27 DIAGNOSIS — C778 Secondary and unspecified malignant neoplasm of lymph nodes of multiple regions: Secondary | ICD-10-CM | POA: Diagnosis not present

## 2015-07-27 DIAGNOSIS — C7970 Secondary malignant neoplasm of unspecified adrenal gland: Secondary | ICD-10-CM

## 2015-07-27 DIAGNOSIS — C7972 Secondary malignant neoplasm of left adrenal gland: Secondary | ICD-10-CM

## 2015-07-27 DIAGNOSIS — R109 Unspecified abdominal pain: Secondary | ICD-10-CM | POA: Insufficient documentation

## 2015-07-27 DIAGNOSIS — I509 Heart failure, unspecified: Secondary | ICD-10-CM | POA: Diagnosis not present

## 2015-07-27 DIAGNOSIS — R569 Unspecified convulsions: Secondary | ICD-10-CM | POA: Diagnosis not present

## 2015-07-27 DIAGNOSIS — Z5111 Encounter for antineoplastic chemotherapy: Secondary | ICD-10-CM | POA: Diagnosis not present

## 2015-07-27 LAB — CBC WITH DIFFERENTIAL/PLATELET
Basophils Absolute: 0 10*3/uL (ref 0–0.1)
Basophils Relative: 0 %
Eosinophils Absolute: 0 10*3/uL (ref 0–0.7)
Eosinophils Relative: 0 %
HCT: 36.5 % — ABNORMAL LOW (ref 40.0–52.0)
Hemoglobin: 12.6 g/dL — ABNORMAL LOW (ref 13.0–18.0)
Lymphocytes Relative: 9 %
Lymphs Abs: 0.7 10*3/uL — ABNORMAL LOW (ref 1.0–3.6)
MCH: 29.8 pg (ref 26.0–34.0)
MCHC: 34.4 g/dL (ref 32.0–36.0)
MCV: 86.6 fL (ref 80.0–100.0)
Monocytes Absolute: 1 10*3/uL (ref 0.2–1.0)
Monocytes Relative: 12 %
Neutro Abs: 6.2 10*3/uL (ref 1.4–6.5)
Neutrophils Relative %: 79 %
Platelets: 303 10*3/uL (ref 150–440)
RBC: 4.22 MIL/uL — ABNORMAL LOW (ref 4.40–5.90)
RDW: 19.2 % — ABNORMAL HIGH (ref 11.5–14.5)
WBC: 7.9 10*3/uL (ref 3.8–10.6)

## 2015-07-27 LAB — COMPREHENSIVE METABOLIC PANEL
ALT: 40 U/L (ref 17–63)
AST: 26 U/L (ref 15–41)
Albumin: 4 g/dL (ref 3.5–5.0)
Alkaline Phosphatase: 81 U/L (ref 38–126)
Anion gap: 8 (ref 5–15)
BUN: 17 mg/dL (ref 6–20)
CO2: 28 mmol/L (ref 22–32)
Calcium: 9.6 mg/dL (ref 8.9–10.3)
Chloride: 102 mmol/L (ref 101–111)
Creatinine, Ser: 0.75 mg/dL (ref 0.61–1.24)
GFR calc Af Amer: >60 mL/min (ref >60)
GFR calc non Af Amer: >60 mL/min (ref >60)
Glucose, Bld: 73 mg/dL (ref 65–99)
Potassium: 3.8 mmol/L (ref 3.5–5.1)
Sodium: 138 mmol/L (ref 135–145)
Total Bilirubin: 0.3 mg/dL (ref 0.3–1.2)
Total Protein: 7.6 g/dL (ref 6.5–8.1)

## 2015-07-27 LAB — MAGNESIUM: Magnesium: 2 mg/dL (ref 1.7–2.4)

## 2015-07-27 MED ORDER — HYDROXYZINE HCL 10 MG PO TABS: 10.0000 mg | ORAL_TABLET | Freq: Three times a day (TID) | ORAL | Status: DC | PRN

## 2015-07-27 MED ORDER — PEMETREXED DISODIUM CHEMO INJECTION 500 MG
500.0000 mg/m2 | Freq: Once | INTRAVENOUS | Status: AC
  Administered 2015-07-27: 800 mg via INTRAVENOUS
  Filled 2015-07-27: qty 28

## 2015-07-27 MED ORDER — HEPARIN SOD (PORK) LOCK FLUSH 100 UNIT/ML IV SOLN
500.0000 [IU] | Freq: Once | INTRAVENOUS | Status: AC | PRN
  Administered 2015-07-27: 500 [IU]
  Filled 2015-07-27: qty 5

## 2015-07-27 MED ORDER — PALONOSETRON HCL INJECTION 0.25 MG/5ML
0.2500 mg | Freq: Once | INTRAVENOUS | Status: AC
  Administered 2015-07-27: 0.25 mg via INTRAVENOUS
  Filled 2015-07-27: qty 5

## 2015-07-27 MED ORDER — SODIUM CHLORIDE 0.9 % IV SOLN
530.0000 mg | Freq: Once | INTRAVENOUS | Status: AC
  Administered 2015-07-27: 530 mg via INTRAVENOUS
  Filled 2015-07-27: qty 53

## 2015-07-27 MED ORDER — SODIUM CHLORIDE 0.9% FLUSH
10.0000 mL | INTRAVENOUS | Status: DC | PRN
  Administered 2015-07-27: 10 mL
  Filled 2015-07-27: qty 10

## 2015-07-27 MED ORDER — SODIUM CHLORIDE 0.9 % IV SOLN
Freq: Once | INTRAVENOUS | Status: AC
  Administered 2015-07-27: 12:00:00 via INTRAVENOUS
  Filled 2015-07-27: qty 1000

## 2015-07-27 MED ORDER — SODIUM CHLORIDE 0.9 % IV SOLN
10.0000 mg | Freq: Once | INTRAVENOUS | Status: AC
  Administered 2015-07-27: 10 mg via INTRAVENOUS
  Filled 2015-07-27: qty 1

## 2015-07-27 NOTE — Progress Notes (Signed)
[facility] day:  07/27/2015    Chief Complaint: Darryl Hatfield is a 55 y.o. 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  HPI:  The patient was last seen in the medical oncology clinic on 07/06/2015.  At that time, he received cycle #3 carboplatin and Alimta.  He noted itchy watery eyes and itchy skin.  Constipation was resolved after stopping oral iron.  He had some back pain which appeared related to his mattress.  We discussed bone scan if his back pain persisted.  CXR on 07/08/2015 was stable.  He received B12 on 07/20/2015.  Symptomatically, once in awhile he notes abdominal discomfort.  His back "bothers me some".  He still has pruritus.  He denies any respiratory symptoms.   Past Medical History  Diagnosis Date  . COPD (chronic obstructive pulmonary disease) ([place])   . Lung cancer ([place]) 2015    RIght   . CHF (congestive heart failure) ([place])   . Seizures ([place])   . Hypothyroidism     Past Surgical History  Procedure Laterality Date  . Back surgery    . Portacath placement Right     Family History  Problem Relation Age of Onset  . Hypertension Other     Social History:  reports that he quit smoking about 2 years ago. He has never used smokeless tobacco. He reports that he does not drink alcohol or use illicit drugs.  He is on disability.    Allergies:  Allergies  Allergen Reactions  . No Known Allergies     Current Medications: Current Outpatient Prescriptions  Medication Sig Dispense Refill  . acetaminophen (TYLENOL) 325 MG tablet Take by mouth.    Marland Kitchen albuterol (PROVENTIL) (2.5 MG/3ML) 0.083% nebulizer solution Take 3 mLs (2.5 mg total) by nebulization every 6 (six) hours as needed for wheezing or shortness of breath. 75 mL 2  . dexamethasone (DECADRON) 4 MG tablet Take 1 tab two times a day the day  before Alimta chemo. Take 2 tabs two times a day starting the day after chemo for 3 days. 30 tablet 1  . DULoxetine (CYMBALTA) 30 MG capsule Take 30 mg by mouth daily.    . folic acid (FOLVITE) 1 MG tablet Take 1 tablet (1 mg total) by mouth daily. Start 5-7 days before Alimta chemotherapy. Continue until 21 days after Alimta completed. 100 tablet 3  . gabapentin (NEURONTIN) 100 MG capsule Take 1 capsule (100 mg total) by mouth 3 (three) times daily. 90 capsule 4  . levothyroxine (SYNTHROID, LEVOTHROID) 88 MCG tablet Take 1 tablet (88 mcg total) by mouth daily before breakfast. 30 tablet 0  . LORazepam (ATIVAN) 0.5 MG tablet Take 1 tablet (0.5 mg total) by mouth every 8 (eight) hours as needed for anxiety. 30 tablet 0  . ondansetron (ZOFRAN) 8 MG tablet Take 1 tablet (8 mg total) by mouth 2 (two) times daily as needed for refractory nausea / vomiting. Start on day 3 after chemo. 30 tablet 1  . QUEtiapine (SEROQUEL) 25 MG tablet Take 1 tablet (25 mg total) by mouth at bedtime. 30 tablet 0  . tiotropium (SPIRIVA) 18 MCG inhalation capsule Place 18 mcg into inhaler and inhale daily.     No current facility-administered medications for this visit.   Facility-Administered Medications Ordered in Other Visits  Medication Dose Route Frequency  Provider Last Rate Last Dose  . sodium chloride 0.9 % injection 10 mL  10 mL Intracatheter PRN Forest Gleason, MD   10 mL at 06/27/14 1338  . sodium chloride 0.9 % injection 10 mL  10 mL Intracatheter PRN Leia Alf, MD   10 mL at 08/22/14 0851  . sodium chloride flush (NS) 0.9 % injection 10 mL  10 mL Intravenous PRN Lequita Asal, MD        Review of Systems:  GENERAL:  Feels "ok".  No fevers or sweats.  Weight up 3 pounds.  Maximum weight 135 pounds. PERFORMANCE STATUS (ECOG):  2 HEENT:  No visual changes, runny nose, sore throat, mouth sores or tenderness. Lungs: Shortness of breath with exertion.  No cough.  No hemoptysis. Cardiac:  No chest pain,  palpitations, orthopnea, or PND. GI:  Once in awhile abdominal discomfort.  Constipation resolved with Miralax.  No nausea, vomiting, diarrhea, melena or hematochezia.  No prior EGD.  Colonoscopy prior to diagnosis. GU:  No urgency, frequency, dysuria, or hematuria. Musculoskeletal:  Chronic back pain (bothersome at times).  No joint pain.  No muscle tenderness. Extremities:  No pain or swelling. Skin:  Itchy skin.  No rashes or skin changes. Neuro:  No headache, numbness or weakness, balance or coordination issues. Endocrine:  No diabetes. Thyroid disease on Synthroid.  No hot flashes or night sweats. Psych:  High anxiety (stable).  No mood changes or depression. Pain:  No focal pain. Review of systems:  All other systems reviewed and found to be negative.  Physical Exam: Blood pressure 120/76, pulse 101, temperature 97.7 F (36.5 C), temperature source Tympanic, height 5' 8"  (1.727 m), weight 126 lb 3.4 oz (57.25 kg). GENERAL:  Thin gentleman sitting comfortably in the exam room in no acute distress.  He is anxious. MENTAL STATUS:  Alert and oriented to person, place and time. HEAD:  Black hair with slight graying.  Mustache.  Temporal wasting.  Normocephalic, atraumatic, face symmetric, no Cushingoid features. EYES:  Glasses.  Blue eyes.  Pupils equal round and reactive to light and accomodation.  No conjunctivitis or scleral icterus. ENT:  Oropharynx clear without lesion.  Tongue normal. Mucous membranes moist.  RESPIRATORY:  Clear to auscultation without rales, wheezes or rhonchi. CARDIOVASCULAR:  Regular rate and rhythm without murmur, rub or gallop. ABDOMEN:  Soft, slightly tender in the left lower quadrant without guarding or rebound tenderness.  Active bowel sounds and no hepatosplenomegaly.  No masses. SKIN:  No rashes, ulcers or lesions. EXTREMITIES: No edema, no skin discoloration or tenderness.  No palpable cords. LYMPH NODES: No palpable cervical, supraclavicular, axillary or  inguinal adenopathy.  NEUROLOGICAL: Unremarkable. PSYCH:  Very anxious.    Appointment on 07/27/2015  Component Date Value Ref Range Status  . WBC 07/27/2015 7.9  3.8 - 10.6 K/uL Final  . RBC 07/27/2015 4.22* 4.40 - 5.90 MIL/uL Final  . Hemoglobin 07/27/2015 12.6* 13.0 - 18.0 g/dL Final  . HCT 07/27/2015 36.5* 40.0 - 52.0 % Final  . MCV 07/27/2015 86.6  80.0 - 100.0 fL Final  . MCH 07/27/2015 29.8  26.0 - 34.0 pg Final  . MCHC 07/27/2015 34.4  32.0 - 36.0 g/dL Final  . RDW 07/27/2015 19.2* 11.5 - 14.5 % Final  . Platelets 07/27/2015 303  150 - 440 K/uL Final  . Neutrophils Relative % 07/27/2015 79   Final  . Neutro Abs 07/27/2015 6.2  1.4 - 6.5 K/uL Final  . Lymphocytes Relative 07/27/2015 9  Final  . Lymphs Abs 07/27/2015 0.7* 1.0 - 3.6 K/uL Final  . Monocytes Relative 07/27/2015 12   Final  . Monocytes Absolute 07/27/2015 1.0  0.2 - 1.0 K/uL Final  . Eosinophils Relative 07/27/2015 0   Final  . Eosinophils Absolute 07/27/2015 0.0  0 - 0.7 K/uL Final  . Basophils Relative 07/27/2015 0   Final  . Basophils Absolute 07/27/2015 0.0  0 - 0.1 K/uL Final  . Sodium 07/27/2015 138  135 - 145 mmol/L Final  . Potassium 07/27/2015 3.8  3.5 - 5.1 mmol/L Final  . Chloride 07/27/2015 102  101 - 111 mmol/L Final  . CO2 07/27/2015 28  22 - 32 mmol/L Final  . Glucose, Bld 07/27/2015 73  65 - 99 mg/dL Final  . BUN 07/27/2015 17  6 - 20 mg/dL Final  . Creatinine, Ser 07/27/2015 0.75  0.61 - 1.24 mg/dL Final  . Calcium 07/27/2015 9.6  8.9 - 10.3 mg/dL Final  . Total Protein 07/27/2015 7.6  6.5 - 8.1 g/dL Final  . Albumin 07/27/2015 4.0  3.5 - 5.0 g/dL Final  . AST 07/27/2015 26  15 - 41 U/L Final  . ALT 07/27/2015 40  17 - 63 U/L Final  . Alkaline Phosphatase 07/27/2015 81  38 - 126 U/L Final  . Total Bilirubin 07/27/2015 0.3  0.3 - 1.2 mg/dL Final  . GFR calc non Af Amer 07/27/2015 >60  >60 mL/min Final  . GFR calc Af Amer 07/27/2015 >60  >60 mL/min Final   Comment: (NOTE) The eGFR has been  calculated using the CKD EPI equation. This calculation has not been validated in all clinical situations. eGFR's persistently <60 mL/min signify possible Chronic Kidney Disease.   . Anion gap 07/27/2015 8  5 - 15 Final  . Magnesium 07/27/2015 2.0  1.7 - 2.4 mg/dL Final    Assessment:  EDGER HUSAIN is a 55 y.o. 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    He was diagnosed with stage IIIA squamous cell lung cancer in 07/02/2013.  PET scan on 06/23/2013 revealed a 1.5 cm right upper lobe nodule (SUV 7.4) concerning for primary bronchogenic carcinoma.  There were hypermetabolic ipsilateral hilar and mediastinal lymph node metastasis. There was malignant range FDG uptake associated with bilateral level 2 cervical lymph nodes of uncertain significance.  EBUS assisted biopsy of a right lower paratracheal and right hilar lymph node on 07/02/2013 confirmed non-small cell carcinoma, favoring squamous cell carcinoma.  Clinical stage was T1N2M0.  He received concurrent chemotherapy and radiation.  He received 6,000 cGy from 08/05/2014 - 09/14/2013.  He received weekly carboplatin and Taxol x 6 from 08/02/2013 - 10/25/2013.  Course was complicated by a late pneumothorax secondary to port placement.  He also had a seizure in 03/2014 with subsequent fracture of lumbar vertebrae and rod placement.  PET scan on 04/12/2015 revealed mild soft tissue fullness along the posterior right upper lobe/perihilar region, with associated mild vague hypermetabolism (SUV 2.6).  There were hypermetabolic bilateral adrenal metastases, right (2.7 cm; SUV 10) greater than left (2.1 cm; SUV 7.8).  There was small hypermetabolic upper abdominal nodal metastases (9 mm porta hepatitis (SUV 7.3) and 7 mm portacaval node (SUV 5.3)).  CT guided right adrenal biopsy on 04/25/2015 revealed metastatic adenocarcinoma of lung primary.  TTF-1 was positive.  PD-L1  testing was negative (< 1%).  EGFR revealed no mutation.  There was no ALK rearrangement.  ROS1 revealed no result (unsuccessful).  RET gene rearrangement was negative.  CEA was 36.5 on 05/25/2015, 34 on 06/15/2015, and 35.3 on 07/06/2015, and 48.8 on 07/27/2015.  He has a mild normocytic anemia.  Anemia work-up on 04/05/2015 revealed iron deficiency anemia (ferritin 48, iron saturation 12%, TIBC 310).  Sedimentation rate was 27.  B12 was 255 (low normal) with a folate 13.8 (normal).  MMA was normal (246).  TSH was 8.840 (high) with normal T4 and T3U, and free thyroxine index.   His last colonoscopy was just prior to his diagnosis.  He has never had an EGD.  He denies any melena or hematochezia.  Diet is poor.  He is on oral iron.  He receives B12 every 9 weeks (began 05/18/2015; last 06/29/107).  He is on folic acid.  He is s/p 3 cycles of carboplatin and Alimta  (05/25/2015 - 07/06/2015).  He is tolerating his chemotherapy well.   Symptomatically, he remains anxious.  He has pruritus.  He has chronic low back pain which is worse at times.  He has intermittent abdominal pain.  Exam is stable.  Plan: 1.  Labs today:  CBC with diff, CMP, Mg, CEA. 2.  Cycle #4 carboplatin and Alimta. 3.  Discuss plans for restaging imaging. 4.  Rx: hydroxyzine 10 mg po q 8 hours prn pruritus. 5.  Schedule chest and abdomen CT in 2 1/2 weeks 6.  Schedule bone scan 2 1/2 weeks 7.  RTC in 3 weeks for MD assess, labs (CBC with diff, CMP, Mg, CEA, TSH, T4), review of scans, and cycle # 5 carboplatin and Alimta   Lequita Asal, MD  07/27/2015, 11:07 AM

## 2015-07-27 NOTE — Progress Notes (Signed)
Patient here for pretreatment check up. Patient has been extremely itchy since his  first treatment.

## 2015-07-28 LAB — CEA: CEA: 48.8 ng/mL — ABNORMAL HIGH (ref 0.0–4.7)

## 2015-08-09 ENCOUNTER — Ambulatory Visit
Admission: RE | Admit: 2015-08-09 | Discharge: 2015-08-09 | Disposition: A | Discharge status: Home / Self Care | Payer: BLUE CROSS/BLUE SHIELD | Source: Ambulatory Visit | Attending: Hematology and Oncology | Admitting: Hematology and Oncology

## 2015-08-09 DIAGNOSIS — C7970 Secondary malignant neoplasm of unspecified adrenal gland: Secondary | ICD-10-CM | POA: Insufficient documentation

## 2015-08-09 DIAGNOSIS — C3411 Malignant neoplasm of upper lobe, right bronchus or lung: Secondary | ICD-10-CM | POA: Diagnosis present

## 2015-08-09 MED ORDER — IOPAMIDOL (ISOVUE-300) INJECTION 61%
100.0000 mL | Freq: Once | INTRAVENOUS | Status: AC | PRN
  Administered 2015-08-09: 100 mL via INTRAVENOUS

## 2015-08-11 ENCOUNTER — Encounter
Admission: RE | Admit: 2015-08-11 | Discharge: 2015-08-11 | Disposition: A | Discharge status: Home / Self Care | Payer: BLUE CROSS/BLUE SHIELD | Source: Ambulatory Visit | Attending: Hematology and Oncology | Admitting: Hematology and Oncology

## 2015-08-11 DIAGNOSIS — C3411 Malignant neoplasm of upper lobe, right bronchus or lung: Secondary | ICD-10-CM | POA: Diagnosis not present

## 2015-08-11 DIAGNOSIS — R937 Abnormal findings on diagnostic imaging of other parts of musculoskeletal system: Secondary | ICD-10-CM | POA: Insufficient documentation

## 2015-08-11 MED ORDER — TECHNETIUM TC 99M MEDRONATE IV KIT
25.0000 | PACK | Freq: Once | INTRAVENOUS | Status: AC | PRN
  Administered 2015-08-11: 23.77 via INTRAVENOUS

## 2015-08-15 ENCOUNTER — Other Ambulatory Visit: Payer: Self-pay

## 2015-08-15 DIAGNOSIS — C3411 Malignant neoplasm of upper lobe, right bronchus or lung: Secondary | ICD-10-CM

## 2015-08-17 ENCOUNTER — Telehealth: Payer: Self-pay

## 2015-08-17 ENCOUNTER — Inpatient Hospital Stay (HOSPITAL_BASED_OUTPATIENT_CLINIC_OR_DEPARTMENT_OTHER): Payer: BLUE CROSS/BLUE SHIELD | Admitting: Hematology and Oncology

## 2015-08-17 ENCOUNTER — Other Ambulatory Visit: Payer: Self-pay | Admitting: *Deleted

## 2015-08-17 ENCOUNTER — Inpatient Hospital Stay: Payer: BLUE CROSS/BLUE SHIELD

## 2015-08-17 ENCOUNTER — Other Ambulatory Visit: Payer: Self-pay | Admitting: Hematology and Oncology

## 2015-08-17 VITALS — BP 117/79 | HR 98 | Temp 97.2°F | Resp 18 | Wt 129.6 lb

## 2015-08-17 DIAGNOSIS — C7800 Secondary malignant neoplasm of unspecified lung: Secondary | ICD-10-CM

## 2015-08-17 DIAGNOSIS — C3411 Malignant neoplasm of upper lobe, right bronchus or lung: Secondary | ICD-10-CM

## 2015-08-17 DIAGNOSIS — C3491 Malignant neoplasm of unspecified part of right bronchus or lung: Secondary | ICD-10-CM

## 2015-08-17 DIAGNOSIS — L299 Pruritus, unspecified: Secondary | ICD-10-CM

## 2015-08-17 DIAGNOSIS — C778 Secondary and unspecified malignant neoplasm of lymph nodes of multiple regions: Secondary | ICD-10-CM | POA: Diagnosis not present

## 2015-08-17 DIAGNOSIS — M545 Low back pain: Secondary | ICD-10-CM

## 2015-08-17 DIAGNOSIS — G8929 Other chronic pain: Secondary | ICD-10-CM

## 2015-08-17 DIAGNOSIS — C7970 Secondary malignant neoplasm of unspecified adrenal gland: Secondary | ICD-10-CM

## 2015-08-17 DIAGNOSIS — C3490 Malignant neoplasm of unspecified part of unspecified bronchus or lung: Secondary | ICD-10-CM

## 2015-08-17 DIAGNOSIS — C7972 Secondary malignant neoplasm of left adrenal gland: Secondary | ICD-10-CM

## 2015-08-17 DIAGNOSIS — Z79899 Other long term (current) drug therapy: Secondary | ICD-10-CM

## 2015-08-17 DIAGNOSIS — D509 Iron deficiency anemia, unspecified: Secondary | ICD-10-CM

## 2015-08-17 DIAGNOSIS — C7971 Secondary malignant neoplasm of right adrenal gland: Secondary | ICD-10-CM

## 2015-08-17 DIAGNOSIS — R109 Unspecified abdominal pain: Secondary | ICD-10-CM

## 2015-08-17 DIAGNOSIS — E038 Other specified hypothyroidism: Secondary | ICD-10-CM

## 2015-08-17 LAB — COMPREHENSIVE METABOLIC PANEL
ALT: 22 U/L (ref 17–63)
AST: 24 U/L (ref 15–41)
Albumin: 4 g/dL (ref 3.5–5.0)
Alkaline Phosphatase: 90 U/L (ref 38–126)
Anion gap: 8 (ref 5–15)
BUN: 13 mg/dL (ref 6–20)
CO2: 25 mmol/L (ref 22–32)
Calcium: 9.4 mg/dL (ref 8.9–10.3)
Chloride: 104 mmol/L (ref 101–111)
Creatinine, Ser: 0.66 mg/dL (ref 0.61–1.24)
GFR calc Af Amer: >60 mL/min (ref >60)
GFR calc non Af Amer: >60 mL/min (ref >60)
Glucose, Bld: 127 mg/dL — ABNORMAL HIGH (ref 65–99)
Potassium: 3.7 mmol/L (ref 3.5–5.1)
Sodium: 137 mmol/L (ref 135–145)
Total Bilirubin: 0.5 mg/dL (ref 0.3–1.2)
Total Protein: 7.4 g/dL (ref 6.5–8.1)

## 2015-08-17 LAB — CBC WITH DIFFERENTIAL/PLATELET
Basophils Absolute: 0 10*3/uL (ref 0–0.1)
Basophils Relative: 0 %
Eosinophils Absolute: 0 10*3/uL (ref 0–0.7)
Eosinophils Relative: 0 %
HCT: 33.6 % — ABNORMAL LOW (ref 40.0–52.0)
Hemoglobin: 11.5 g/dL — ABNORMAL LOW (ref 13.0–18.0)
Lymphocytes Relative: 12 %
Lymphs Abs: 0.8 10*3/uL — ABNORMAL LOW (ref 1.0–3.6)
MCH: 30.3 pg (ref 26.0–34.0)
MCHC: 34.3 g/dL (ref 32.0–36.0)
MCV: 88.3 fL (ref 80.0–100.0)
Monocytes Absolute: 0.5 10*3/uL (ref 0.2–1.0)
Monocytes Relative: 8 %
Neutro Abs: 5.2 10*3/uL (ref 1.4–6.5)
Neutrophils Relative %: 80 %
Platelets: 247 10*3/uL (ref 150–440)
RBC: 3.8 MIL/uL — ABNORMAL LOW (ref 4.40–5.90)
RDW: 20.2 % — ABNORMAL HIGH (ref 11.5–14.5)
WBC: 6.5 10*3/uL (ref 3.8–10.6)

## 2015-08-17 LAB — T4, FREE: Free T4: 1.08 ng/dL (ref 0.61–1.12)

## 2015-08-17 LAB — MAGNESIUM: Magnesium: 1.9 mg/dL (ref 1.7–2.4)

## 2015-08-17 LAB — TSH: TSH: 0.055 u[IU]/mL — ABNORMAL LOW (ref 0.350–4.500)

## 2015-08-17 MED ORDER — SODIUM CHLORIDE 0.9 % IV SOLN
530.0000 mg | Freq: Once | INTRAVENOUS | Status: AC
  Administered 2015-08-17: 530 mg via INTRAVENOUS
  Filled 2015-08-17: qty 53

## 2015-08-17 MED ORDER — DEXAMETHASONE 4 MG PO TABS: ORAL_TABLET | ORAL | 1 refills | Status: DC

## 2015-08-17 MED ORDER — HYDROXYZINE HCL 10 MG PO TABS: 10.0000 mg | ORAL_TABLET | Freq: Three times a day (TID) | ORAL | 1 refills | Status: DC | PRN

## 2015-08-17 MED ORDER — PALONOSETRON HCL INJECTION 0.25 MG/5ML
0.2500 mg | Freq: Once | INTRAVENOUS | Status: AC
  Administered 2015-08-17: 0.25 mg via INTRAVENOUS
  Filled 2015-08-17: qty 5

## 2015-08-17 MED ORDER — DEXAMETHASONE SODIUM PHOSPHATE 100 MG/10ML IJ SOLN
10.0000 mg | Freq: Once | INTRAMUSCULAR | Status: AC
  Administered 2015-08-17: 10 mg via INTRAVENOUS
  Filled 2015-08-17: qty 1

## 2015-08-17 MED ORDER — SODIUM CHLORIDE 0.9% FLUSH
10.0000 mL | INTRAVENOUS | Status: DC | PRN
  Administered 2015-08-17: 10 mL
  Filled 2015-08-17: qty 10

## 2015-08-17 MED ORDER — HEPARIN SOD (PORK) LOCK FLUSH 100 UNIT/ML IV SOLN
500.0000 [IU] | Freq: Once | INTRAVENOUS | Status: AC | PRN
  Administered 2015-08-17: 500 [IU]
  Filled 2015-08-17: qty 5

## 2015-08-17 MED ORDER — SODIUM CHLORIDE 0.9 % IV SOLN
Freq: Once | INTRAVENOUS | Status: AC
  Administered 2015-08-17: 12:00:00 via INTRAVENOUS
  Filled 2015-08-17: qty 1000

## 2015-08-17 MED ORDER — SODIUM CHLORIDE 0.9 % IV SOLN
500.0000 mg/m2 | Freq: Once | INTRAVENOUS | Status: AC
  Administered 2015-08-17: 800 mg via INTRAVENOUS
  Filled 2015-08-17: qty 20

## 2015-08-17 NOTE — Progress Notes (Signed)
[facility] day:  08/17/15    Chief Complaint: Darryl Hatfield is a 55 y.o. 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  HPI:  The patient was last seen in the medical oncology clinic on 07/27/2015.  At that time, he received cycle #4 carboplatin and Alimta.  He had pruritus.  Hydroxyzine was prescribed.  Chest, abdomen, and pelvic CT scan on 08/09/2015 revealed radiation changes in the right upper lobe extending to the perihilar region. There was stable mild soft tissue prominence laterally.  There were stable bilateral adrenal metastases.  There were mildly prominent upper abdominal lymph nodes measuring up to 11 mm, possibly reflecting nodal metastases.  Bone scan on 08/11/2015 revealed mild multifocal areas of increased activity about the cervical and lumbar spine, most likely degenerative.   There was mild increased activity of the right shoulder most likely degenerative.   Symptomatically, he notes that some days are more itchy.  He is using hydroxyzine as well as cortisone cream.  He denies any new issues with his back.  He states that he had back surgery in 09/2014.  He has a follow-up with orthopedics in 08/2015.  Plain films are performed.    Past Medical History:  Diagnosis Date  . CHF (congestive heart failure) ([place])   . COPD (chronic obstructive pulmonary disease) ([place])   . Hypothyroidism   . Lung cancer ([place]) 2015   RIght   . Seizures ([place])     Past Surgical History:  Procedure Laterality Date  . BACK SURGERY    . PORTACATH PLACEMENT Right     Family History  Problem Relation Age of Onset  . Hypertension Other     Social History:  reports that he quit smoking about 2 years ago. He has a 40.00 pack-year smoking history. He has never used smokeless tobacco. He  reports that he does not drink alcohol or use drugs.  He is on disability.  He is accompanied by his wife, [name].  Allergies:  Allergies  Allergen Reactions  . No Known Allergies     Current Medications: Current Outpatient Prescriptions  Medication Sig Dispense Refill  . acetaminophen (TYLENOL) 325 MG tablet Take by mouth.    Marland Kitchen albuterol (PROVENTIL) (2.5 MG/3ML) 0.083% nebulizer solution Take 3 mLs (2.5 mg total) by nebulization every 6 (six) hours as needed for wheezing or shortness of breath. 75 mL 2  . dexamethasone (DECADRON) 4 MG tablet Take 1 tab two times a day the day before Alimta chemo. Take 2 tabs two times a day starting the day after chemo for 3 days. 30 tablet 1  . DULoxetine (CYMBALTA) 30 MG capsule Take 30 mg by mouth daily.    . folic acid (FOLVITE) 1 MG tablet Take 1 tablet (1 mg total) by mouth daily. Start 5-7 days before Alimta chemotherapy. Continue until 21 days after Alimta completed. 100 tablet 3  . gabapentin (NEURONTIN) 100 MG capsule Take 1 capsule (100 mg total) by mouth 3 (three) times daily. 90 capsule 4  . hydrOXYzine (ATARAX/VISTARIL) 10 MG tablet Take 1 tablet (10 mg total) by mouth 3 (three) times daily as needed for itching. 30 tablet 0  . levothyroxine (SYNTHROID, LEVOTHROID) 88 MCG tablet Take 1 tablet (88 mcg total) by mouth daily before breakfast. 30 tablet 0  .  LORazepam (ATIVAN) 0.5 MG tablet Take 1 tablet (0.5 mg total) by mouth every 8 (eight) hours as needed for anxiety. 30 tablet 0  . ondansetron (ZOFRAN) 8 MG tablet Take 1 tablet (8 mg total) by mouth 2 (two) times daily as needed for refractory nausea / vomiting. Start on day 3 after chemo. 30 tablet 1  . QUEtiapine (SEROQUEL) 50 MG tablet Take by mouth.    . Fluticasone-Salmeterol (ADVAIR DISKUS) 250-50 MCG/DOSE AEPB Inhale into the lungs.    . Multiple Vitamin (MULTI-VITAMINS) TABS Take by mouth.    . tiotropium (SPIRIVA) 18 MCG inhalation capsule Place 18 mcg into inhaler and inhale daily.      No current facility-administered medications for this visit.    Facility-Administered Medications Ordered in Other Visits  Medication Dose Route Frequency Provider Last Rate Last Dose  . sodium chloride 0.9 % injection 10 mL  10 mL Intracatheter PRN Forest Gleason, MD   10 mL at 06/27/14 1338  . sodium chloride 0.9 % injection 10 mL  10 mL Intracatheter PRN Leia Alf, MD   10 mL at 08/22/14 0851  . sodium chloride flush (NS) 0.9 % injection 10 mL  10 mL Intravenous PRN Lequita Asal, MD        Review of Systems:  GENERAL:  Feels "ok".  No fevers or sweats.  Weight up 3 pounds.  Maximum weight 135 pounds. PERFORMANCE STATUS (ECOG):  2 HEENT:  No visual changes, runny nose, sore throat, mouth sores or tenderness. Lungs: Shortness of breath with exertion (no chsange).  No cough.  No hemoptysis. Cardiac:  No chest pain, palpitations, orthopnea, or PND. GI:  Constipation resolved with Miralax.  No nausea, vomiting, diarrhea, melena or hematochezia.  No prior EGD.  Colonoscopy prior to diagnosis. GU:  No urgency, frequency, dysuria, or hematuria. Musculoskeletal:  Chronic back pain (orthopedic appt soon).  No joint pain.  No muscle tenderness. Extremities:  No pain or swelling. Skin:  Itchy skin.  No rashes or skin changes. Neuro:  No headache, numbness or weakness, balance or coordination issues. Endocrine:  No diabetes. Thyroid disease on Synthroid.  No hot flashes or night sweats. Psych:  High anxiety (stable).  No mood changes or depression. Pain:  No focal pain. Review of systems:  All other systems reviewed and found to be negative.  Physical Exam: Blood pressure 117/79, pulse 98, temperature 97.2 F (36.2 C), temperature source Tympanic, resp. rate 18, weight 129 lb 10.1 oz (58.8 kg). GENERAL:  Thin gentleman sitting comfortably in the exam room in no acute distress.  He is anxious. MENTAL STATUS:  Alert and oriented to person, place and time. HEAD:  Black hair with slight  graying.  Mustache.  Temporal wasting.  Normocephalic, atraumatic, face symmetric, no Cushingoid features. EYES:  Glasses.  Blue eyes.  Pupils equal round and reactive to light and accomodation.  No conjunctivitis or scleral icterus. ENT:  Oropharynx clear without lesion.  Dentures.  Tongue normal. Mucous membranes moist.  RESPIRATORY:  Clear to auscultation without rales, wheezes or rhonchi. CARDIOVASCULAR:  Regular rate and rhythm without murmur, rub or gallop. ABDOMEN:  Soft, non-tender with active bowel sounds and no hepatosplenomegaly.  No masses. SKIN:  Excoriations arms (left > right).  No rashes, ulcers or lesions. EXTREMITIES: No edema, no skin discoloration or tenderness.  No palpable cords. LYMPH NODES: No palpable cervical, supraclavicular, axillary or inguinal adenopathy.  NEUROLOGICAL: Unremarkable. PSYCH:  Very anxious.    Appointment on 08/17/2015  Component Date Value  Ref Range Status  . WBC 08/17/2015 6.5  3.8 - 10.6 K/uL Final  . RBC 08/17/2015 3.80* 4.40 - 5.90 MIL/uL Final  . Hemoglobin 08/17/2015 11.5* 13.0 - 18.0 g/dL Final  . HCT 08/17/2015 33.6* 40.0 - 52.0 % Final  . MCV 08/17/2015 88.3  80.0 - 100.0 fL Final  . MCH 08/17/2015 30.3  26.0 - 34.0 pg Final  . MCHC 08/17/2015 34.3  32.0 - 36.0 g/dL Final  . RDW 08/17/2015 20.2* 11.5 - 14.5 % Final  . Platelets 08/17/2015 247  150 - 440 K/uL Final  . Neutrophils Relative % 08/17/2015 80  % Final  . Neutro Abs 08/17/2015 5.2  1.4 - 6.5 K/uL Final  . Lymphocytes Relative 08/17/2015 12  % Final  . Lymphs Abs 08/17/2015 0.8* 1.0 - 3.6 K/uL Final  . Monocytes Relative 08/17/2015 8  % Final  . Monocytes Absolute 08/17/2015 0.5  0.2 - 1.0 K/uL Final  . Eosinophils Relative 08/17/2015 0  % Final  . Eosinophils Absolute 08/17/2015 0.0  0 - 0.7 K/uL Final  . Basophils Relative 08/17/2015 0  % Final  . Basophils Absolute 08/17/2015 0.0  0 - 0.1 K/uL Final  . Sodium 08/17/2015 137  135 - 145 mmol/L Final  . Potassium  08/17/2015 3.7  3.5 - 5.1 mmol/L Final  . Chloride 08/17/2015 104  101 - 111 mmol/L Final  . CO2 08/17/2015 25  22 - 32 mmol/L Final  . Glucose, Bld 08/17/2015 127* 65 - 99 mg/dL Final  . BUN 08/17/2015 13  6 - 20 mg/dL Final  . Creatinine, Ser 08/17/2015 0.66  0.61 - 1.24 mg/dL Final  . Calcium 08/17/2015 9.4  8.9 - 10.3 mg/dL Final  . Total Protein 08/17/2015 7.4  6.5 - 8.1 g/dL Final  . Albumin 08/17/2015 4.0  3.5 - 5.0 g/dL Final  . AST 08/17/2015 24  15 - 41 U/L Final  . ALT 08/17/2015 22  17 - 63 U/L Final  . Alkaline Phosphatase 08/17/2015 90  38 - 126 U/L Final  . Total Bilirubin 08/17/2015 0.5  0.3 - 1.2 mg/dL Final  . GFR calc non Af Amer 08/17/2015 >60  >60 mL/min Final  . GFR calc Af Amer 08/17/2015 >60  >60 mL/min Final   Comment: (NOTE) The eGFR has been calculated using the CKD EPI equation. This calculation has not been validated in all clinical situations. eGFR's persistently <60 mL/min signify possible Chronic Kidney Disease.   . Anion gap 08/17/2015 8  5 - 15 Final  . Magnesium 08/17/2015 1.9  1.7 - 2.4 mg/dL Final    Assessment:  SIRCHARLES HOLZHEIMER is a 55 y.o. 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    He was diagnosed with stage IIIA squamous cell lung cancer in 07/02/2013.  PET scan on 06/23/2013 revealed a 1.5 cm right upper lobe nodule (SUV 7.4) concerning for primary bronchogenic carcinoma.  There were hypermetabolic ipsilateral hilar and mediastinal lymph node metastasis. There was malignant range FDG uptake associated with bilateral level 2 cervical lymph nodes of uncertain significance.  EBUS assisted biopsy of a right lower paratracheal and right hilar lymph node on 07/02/2013 confirmed non-small cell carcinoma, favoring squamous cell carcinoma.  Clinical stage was T1N2M0.  He received concurrent chemotherapy and radiation.  He received 6,000 cGy from 08/05/2014 - 09/14/2013.  He  received weekly carboplatin and Taxol x 6 from 08/02/2013 - 10/25/2013.  Course was  complicated by a late pneumothorax secondary to port placement.  He also had a seizure in 03/2014 with subsequent fracture of lumbar vertebrae and rod placement.  PET scan on 04/12/2015 revealed mild soft tissue fullness along the posterior right upper lobe/perihilar region, with associated mild vague hypermetabolism (SUV 2.6).  There were hypermetabolic bilateral adrenal metastases, right (2.7 cm; SUV 10) greater than left (2.1 cm; SUV 7.8).  There was small hypermetabolic upper abdominal nodal metastases (9 mm porta hepatitis (SUV 7.3) and 7 mm portacaval node (SUV 5.3)).  CT guided right adrenal biopsy on 04/25/2015 revealed metastatic adenocarcinoma of lung primary.  TTF-1 was positive.  PD-L1 testing was negative (< 1%).  EGFR revealed no mutation.  There was no ALK rearrangement.  ROS1 revealed no result (unsuccessful).  RET gene rearrangement was negative.    CEA was 36.5 on 05/25/2015, 34 on 06/15/2015, and 35.3 on 07/06/2015, 48.8 on 07/27/2015, and 62.5 on 08/17/2015.  He has a mild normocytic anemia.  Anemia work-up on 04/05/2015 revealed iron deficiency anemia (ferritin 48, iron saturation 12%, TIBC 310).  Sedimentation rate was 27.  B12 was 255 (low normal) with a folate 13.8 (normal).  MMA was normal (246).  TSH was 8.840 (high) with normal T4 and T3U, and free thyroxine index.   His last colonoscopy was just prior to his diagnosis.  He has never had an EGD.  He denies any melena or hematochezia.  Diet is poor.  He is on oral iron.  He receives B12 every 9 weeks (began 05/18/2015; last 06/29/107).  He is on folic acid.  He is s/p 4 cycles of carboplatin and Alimta  (05/25/2015 - 07/27/2015).    Chest, abdomen, and pelvic CT scan on 08/09/2015 revealed radiation changes in the right upper lobe extending to the perihilar region. There was stable mild soft tissue prominence laterally.  There were stable  bilateral adrenal metastases.  There were mildly prominent upper abdominal lymph nodes measuring up to 11 mm.  Bone scan on 08/11/2015 revealed mild multifocal areas of increased activity about the cervical and lumbar spine and right shoulder, most likely degenerative.   Symptomatically, he remains anxious.  He has pruritus.  He has chronic low back pain.  Exam is stable.  Plan: 1.  Labs today:  CBC with diff, CMP, Mg, CEA, TSH, T4. 2.  Review restaging studies.  Disease is stable.  Discuss continuation of chemotherapy for a total of 6 cycles of carboplatin and Alimta followed by maintenance Alimta. 3.  Cycle # 5 carboplatin and Alimta. 4.  Ensure biopsy sent for MMR. 5.  Follow-up with orthopedics as scheduled. 6.  Refill hydroxyzine. 7.  RTC in 3 weeks for MD assess, labs (CBC with diff, CMP, Mg, CEA) and cycle #6 carboplatin ad Alimta  Addendum:  CEA continues to rise despite stable scans (CT and bone scan).  Will schedule PET scan prior to next cycle.   Lequita Asal, MD  08/17/2015, 11:03 AM

## 2015-08-17 NOTE — Telephone Encounter (Signed)
Faxed results to patient PCP

## 2015-08-17 NOTE — Telephone Encounter (Signed)
-----   Message from Luella Cook, RN sent at 08/17/2015  5:10 PM EDT ----- Regarding: FW: Please send patient's TSH and free T4 to his PCP   ----- Message ----- From: Lequita Asal, MD Sent: 08/17/2015   4:46 PM To: Luella Cook, RN Subject: Please send patient's TSH and free T4 to his#   Please send patient's TSH and free T4 to his PCP  And notify patient's wife.  I don't understand the results.  Will likely need to be repeated.  M  ----- Message ----- From: Interface, Lab In [place] Sent: 08/17/2015   9:24 AM To: Lequita Asal, MD

## 2015-08-17 NOTE — Progress Notes (Signed)
Patient is here for follow up, he did have some back pain but not hurting right now. He needs a refill on Hydroxyzine, dexamethazone. He is doing well today.

## 2015-08-18 LAB — CEA: CEA: 62.5 ng/mL — ABNORMAL HIGH (ref 0.0–4.7)

## 2015-08-20 ENCOUNTER — Encounter: Payer: Self-pay | Admitting: Hematology and Oncology

## 2015-08-21 ENCOUNTER — Telehealth: Payer: Self-pay | Admitting: *Deleted

## 2015-08-21 NOTE — Telephone Encounter (Signed)
-----   Message from Lequita Asal, MD sent at 08/20/2015 12:03 PM EDT ----- Regarding: Please call patient's wife  I would like to get a PET scan prior to next chemotherapy.  CEA is increasing and CT scans/bone scan show stable disease.  Order in.  M

## 2015-08-21 NOTE — Telephone Encounter (Signed)
Called and spoke to [place], pt did not answer phone and told her that the ct scan showed stable disease but his tumor marker was elevated and dr Mike Gip wants a pet scan.  [name] thought it was a good idea and pt was taking a nap but I would have lisa to cal lthem back with date and time for pet scan. She gave me 2 dates to avoid because of other appt which is 30, September 24, 2022. That info was passed on to [place] who will call him with date

## 2015-08-29 ENCOUNTER — Telehealth: Payer: Self-pay | Admitting: *Deleted

## 2015-08-29 NOTE — Telephone Encounter (Addendum)
Called to ask if prednisone and abx will interfere with his scheduled PET scan next week. He fell in yard yesterday and went to see PCP who ordered Xrays and decided to put him on meds prophylactic Prednisone and Augmentin . Please advise

## 2015-08-29 NOTE — Telephone Encounter (Signed)
Er Dr Mike Gip, ok to take prednisone and Augmentin and have scan next week. [name] informed

## 2015-09-05 ENCOUNTER — Encounter: Admission: RE | Admit: 2015-09-05 | Payer: BLUE CROSS/BLUE SHIELD | Source: Ambulatory Visit

## 2015-09-06 ENCOUNTER — Other Ambulatory Visit: Payer: Self-pay | Admitting: Hematology and Oncology

## 2015-09-06 ENCOUNTER — Ambulatory Visit
Admission: RE | Admit: 2015-09-06 | Discharge: 2015-09-06 | Disposition: A | Discharge status: Home / Self Care | Payer: BLUE CROSS/BLUE SHIELD | Source: Ambulatory Visit | Attending: Hematology and Oncology | Admitting: Hematology and Oncology

## 2015-09-06 DIAGNOSIS — C7971 Secondary malignant neoplasm of right adrenal gland: Secondary | ICD-10-CM | POA: Diagnosis not present

## 2015-09-06 DIAGNOSIS — C7972 Secondary malignant neoplasm of left adrenal gland: Secondary | ICD-10-CM | POA: Insufficient documentation

## 2015-09-06 DIAGNOSIS — C3491 Malignant neoplasm of unspecified part of right bronchus or lung: Secondary | ICD-10-CM | POA: Diagnosis not present

## 2015-09-06 DIAGNOSIS — C771 Secondary and unspecified malignant neoplasm of intrathoracic lymph nodes: Secondary | ICD-10-CM | POA: Diagnosis not present

## 2015-09-06 DIAGNOSIS — R590 Localized enlarged lymph nodes: Secondary | ICD-10-CM | POA: Diagnosis not present

## 2015-09-06 LAB — GLUCOSE, CAPILLARY: Glucose-Capillary: 70 mg/dL (ref 65–99)

## 2015-09-06 MED ORDER — FLUDEOXYGLUCOSE F - 18 (FDG) INJECTION
12.4300 | Freq: Once | INTRAVENOUS | Status: AC | PRN
  Administered 2015-09-06: 12.43 via INTRAVENOUS

## 2015-09-06 NOTE — Progress Notes (Signed)
[facility] day:  09/07/15    Chief Complaint: Darryl Hatfield is a 55 y.o. 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  HPI:  The patient was last seen in the medical oncology clinic on 08/17/2015.  At that time, he received cycle #5 carboplatin and Alimta.  CT scans revealed stable disease.  After his appointment, CEA had increased.  PET scan was scheduled.  PET scan on 09/06/2015 revealed interval progression of nodal metastasis with new upper mediastinal hypermetabolic adenopathy, bilateral hypermetabolic lymph nodes adjacent to the tails of the parotid glands, and increased number of hypermetabolic periaortic lymph nodes.  There was stable to mildly increased bilateral adrenal metastasis.  There was metabolic activity associated anterior LEFT ribs is in a traumatic pattern.  Symptomatically, he denies any new complaints.  He notes some sternal discomfort.  He has gained some weight.   Past Medical History:  Diagnosis Date  . CHF (congestive heart failure) ([place])   . COPD (chronic obstructive pulmonary disease) ([place])   . Hypothyroidism   . Lung cancer ([place]) 2015   RIght   . Seizures ([place])     Past Surgical History:  Procedure Laterality Date  . BACK SURGERY    . PORTACATH PLACEMENT Right     Family History  Problem Relation Age of Onset  . Hypertension Other     Social History:  reports that he quit smoking about 2 years ago. He has a 40.00 pack-year smoking history. He has never used smokeless tobacco. He reports that he does not drink alcohol or use drugs.  He is on disability.  He is accompanied by his wife, [name].  Allergies:  Allergies  Allergen Reactions  . No Known Allergies     Current Medications: Current Outpatient Prescriptions  Medication Sig Dispense Refill  .  acetaminophen (TYLENOL) 325 MG tablet Take by mouth.    Marland Kitchen albuterol (PROVENTIL) (2.5 MG/3ML) 0.083% nebulizer solution Take 3 mLs (2.5 mg total) by nebulization every 6 (six) hours as needed for wheezing or shortness of breath. 75 mL 2  . dexamethasone (DECADRON) 4 MG tablet Take 1 tab two times a day the day before Alimta chemo. Take 2 tabs two times a day starting the day after chemo for 3 days. 30 tablet 1  . DULoxetine (CYMBALTA) 30 MG capsule Take 30 mg by mouth daily.    . folic acid (FOLVITE) 1 MG tablet Take 1 tablet (1 mg total) by mouth daily. Start 5-7 days before Alimta chemotherapy. Continue until 21 days after Alimta completed. 100 tablet 3  . gabapentin (NEURONTIN) 100 MG capsule Take 1 capsule (100 mg total) by mouth 3 (three) times daily. 90 capsule 4  . hydrOXYzine (ATARAX/VISTARIL) 10 MG tablet Take 1 tablet (10 mg total) by mouth 3 (three) times daily as needed for itching. 60 tablet 1  . levothyroxine (SYNTHROID, LEVOTHROID) 88 MCG tablet Take 1 tablet (88 mcg total) by mouth daily before breakfast. 30 tablet 0  . LORazepam (ATIVAN) 0.5 MG tablet Take 1 tablet (0.5 mg total) by mouth every 8 (eight) hours as needed for anxiety. 30 tablet 0  . ondansetron (ZOFRAN) 8 MG tablet Take 1 tablet (8 mg total) by mouth 2 (two) times daily as needed for refractory nausea / vomiting. Start on day 3 after chemo. 30 tablet  1  . QUEtiapine (SEROQUEL) 50 MG tablet Take by mouth.    . tiotropium (SPIRIVA) 18 MCG inhalation capsule Place 18 mcg into inhaler and inhale daily.    . Fluticasone-Salmeterol (ADVAIR DISKUS) 250-50 MCG/DOSE AEPB Inhale into the lungs.    . Multiple Vitamin (MULTI-VITAMINS) TABS Take by mouth.     No current facility-administered medications for this visit.    Facility-Administered Medications Ordered in Other Visits  Medication Dose Route Frequency Provider Last Rate Last Dose  . sodium chloride 0.9 % injection 10 mL  10 mL Intracatheter PRN Forest Gleason, MD   10 mL  at 06/27/14 1338  . sodium chloride 0.9 % injection 10 mL  10 mL Intracatheter PRN Leia Alf, MD   10 mL at 08/22/14 0851  . sodium chloride flush (NS) 0.9 % injection 10 mL  10 mL Intravenous PRN Lequita Asal, MD        Review of Systems:  GENERAL:  Feels "ok".  No fevers or sweats.  Weight up 6 pounds since last visit.  Maximum weight 135 pounds. PERFORMANCE STATUS (ECOG):  2 HEENT:  No visual changes, runny nose, sore throat, mouth sores or tenderness. Lungs: Shortness of breath with exertion (no change).  No cough.  No hemoptysis. Cardiac:  No chest pain, palpitations, orthopnea, or PND. GI:  Constipation resolved with Miralax.  Some nausea.  No vomiting, diarrhea, melena or hematochezia.  No prior EGD.  Colonoscopy prior to diagnosis. GU:  No urgency, frequency, dysuria, or hematuria. Musculoskeletal:  Chronic back pain.  Sternal discomfort.  No joint pain.  No muscle tenderness. Extremities:  No pain or swelling. Skin:  Itchy skin.  No rashes or skin changes. Neuro:  No headache, numbness or weakness, balance or coordination issues. Endocrine:  No diabetes. Thyroid disease on Synthroid.  No hot flashes or night sweats. Psych:  High anxiety (stable).  No mood changes or depression. Pain:  No focal pain. Review of systems:  All other systems reviewed and found to be negative.  Physical Exam: Blood pressure 126/86, pulse (!) 102, temperature (!) 96.5 F (35.8 C), temperature source Tympanic, resp. rate 18, weight 135 lb 12.9 oz (61.6 kg), SpO2 99 %. GENERAL:  Thin gentleman sitting comfortably in the exam room in no acute distress.  He is anxious (baseline) MENTAL STATUS:  Alert and oriented to person, place and time. HEAD:  Black hair with slight graying.  Mustache.  Temporal wasting.  Normocephalic, atraumatic, face symmetric, no Cushingoid features. EYES:  Glasses.  Blue eyes.  Pupils equal round and reactive to light and accomodation.  No conjunctivitis or scleral  icterus. ENT:  Oropharynx clear without lesion.  Dentures.  Tongue normal. Mucous membranes moist.  RESPIRATORY:  Clear to auscultation without rales, wheezes or rhonchi. CARDIOVASCULAR:  Regular rate and rhythm without murmur, rub or gallop. ABDOMEN:  Soft, non-tender with active bowel sounds and no hepatosplenomegaly.  No masses. SKIN:  No rashes, ulcers or lesions. EXTREMITIES: No edema, no skin discoloration or tenderness.  No palpable cords. LYMPH NODES: No palpable cervical, supraclavicular, axillary or inguinal adenopathy.  NEUROLOGICAL: Unremarkable. PSYCH:  Very anxious.    Appointment on 09/07/2015  Component Date Value Ref Range Status  . WBC 09/07/2015 5.7  3.8 - 10.6 K/uL Final  . RBC 09/07/2015 3.74* 4.40 - 5.90 MIL/uL Final  . Hemoglobin 09/07/2015 12.0* 13.0 - 18.0 g/dL Final  . HCT 09/07/2015 34.4* 40.0 - 52.0 % Final  . MCV 09/07/2015 91.8  80.0 - 100.0  fL Final  . MCH 09/07/2015 32.1  26.0 - 34.0 pg Final  . MCHC 09/07/2015 35.0  32.0 - 36.0 g/dL Final  . RDW 09/07/2015 18.2* 11.5 - 14.5 % Final  . Platelets 09/07/2015 204  150 - 440 K/uL Final  . Neutrophils Relative % 09/07/2015 87  % Final  . Neutro Abs 09/07/2015 5.0  1.4 - 6.5 K/uL Final  . Lymphocytes Relative 09/07/2015 9  % Final  . Lymphs Abs 09/07/2015 0.5* 1.0 - 3.6 K/uL Final  . Monocytes Relative 09/07/2015 4  % Final  . Monocytes Absolute 09/07/2015 0.2  0.2 - 1.0 K/uL Final  . Eosinophils Relative 09/07/2015 0  % Final  . Eosinophils Absolute 09/07/2015 0.0  0 - 0.7 K/uL Final  . Basophils Relative 09/07/2015 0  % Final  . Basophils Absolute 09/07/2015 0.0  0 - 0.1 K/uL Final  . Sodium 09/07/2015 135  135 - 145 mmol/L Final  . Potassium 09/07/2015 3.9  3.5 - 5.1 mmol/L Final  . Chloride 09/07/2015 101  101 - 111 mmol/L Final  . CO2 09/07/2015 25  22 - 32 mmol/L Final  . Glucose, Bld 09/07/2015 166* 65 - 99 mg/dL Final  . BUN 09/07/2015 18  6 - 20 mg/dL Final  . Creatinine, Ser 09/07/2015 0.82   0.61 - 1.24 mg/dL Final  . Calcium 09/07/2015 9.0  8.9 - 10.3 mg/dL Final  . Total Protein 09/07/2015 7.1  6.5 - 8.1 g/dL Final  . Albumin 09/07/2015 3.9  3.5 - 5.0 g/dL Final  . AST 09/07/2015 34  15 - 41 U/L Final  . ALT 09/07/2015 35  17 - 63 U/L Final  . Alkaline Phosphatase 09/07/2015 93  38 - 126 U/L Final  . Total Bilirubin 09/07/2015 0.5  0.3 - 1.2 mg/dL Final  . GFR calc non Af Amer 09/07/2015 >60  >60 mL/min Final  . GFR calc Af Amer 09/07/2015 >60  >60 mL/min Final   Comment: (NOTE) The eGFR has been calculated using the CKD EPI equation. This calculation has not been validated in all clinical situations. eGFR's persistently <60 mL/min signify possible Chronic Kidney Disease.   . Anion gap 09/07/2015 9  5 - 15 Final  . Magnesium 09/07/2015 1.9  1.7 - 2.4 mg/dL Final  Hospital Outpatient Visit on 09/06/2015  Component Date Value Ref Range Status  . Glucose-Capillary 09/06/2015 70  65 - 99 mg/dL Final    Assessment:  Darryl Hatfield is a 55 y.o. 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    He was diagnosed with stage IIIA squamous cell lung cancer in 07/02/2013.  PET scan on 06/23/2013 revealed a 1.5 cm right upper lobe nodule (SUV 7.4) concerning for primary bronchogenic carcinoma.  There were hypermetabolic ipsilateral hilar and mediastinal lymph node metastasis. There was malignant range FDG uptake associated with bilateral level 2 cervical lymph nodes of uncertain significance.  EBUS assisted biopsy of a right lower paratracheal and right hilar lymph node on 07/02/2013 confirmed non-small cell carcinoma, favoring squamous cell carcinoma.  Clinical stage was T1N2M0.  He received concurrent chemotherapy and radiation.  He received 6,000 cGy from 08/05/2014 - 09/14/2013.  He received weekly carboplatin and Taxol x 6 from 08/02/2013 - 10/25/2013.  Course was complicated by a late pneumothorax secondary  to port placement.  He also had a seizure in 03/2014 with subsequent fracture of lumbar vertebrae and rod placement.  PET scan  on 04/12/2015 revealed mild soft tissue fullness along the posterior right upper lobe/perihilar region, with associated mild vague hypermetabolism (SUV 2.6).  There were hypermetabolic bilateral adrenal metastases, right (2.7 cm; SUV 10) greater than left (2.1 cm; SUV 7.8).  There was small hypermetabolic upper abdominal nodal metastases (9 mm porta hepatitis (SUV 7.3) and 7 mm portacaval node (SUV 5.3)).  CT guided right adrenal biopsy on 04/25/2015 revealed metastatic adenocarcinoma of lung primary.  TTF-1 was positive.  PD-L1 testing was negative (< 1%).  EGFR revealed no mutation.  There was no ALK rearrangement.  ROS1 revealed no result (unsuccessful).  RET gene rearrangement was negative.    CEA was 36.5 on 05/25/2015, 34 on 06/15/2015, and 35.3 on 07/06/2015, 48.8 on 07/27/2015, 62.5 on 08/17/2015, and 115.6 on 09/07/2015.  He has a mild normocytic anemia.  Anemia work-up on 04/05/2015 revealed iron deficiency anemia (ferritin 48, iron saturation 12%, TIBC 310).  Sedimentation rate was 27.  B12 was 255 (low normal) with a folate 13.8 (normal).  MMA was normal (246).  TSH was 8.840 (high) with normal T4 and T3U, and free thyroxine index.   His last colonoscopy was just prior to his diagnosis.  He has never had an EGD.  He denies any melena or hematochezia.  Diet is poor.  He is on oral iron.  He receives B12 every 9 weeks (began 05/18/2015; last 06/29/107).  He is on folic acid.  He received 5 cycles of carboplatin and Alimta  (05/25/2015 - 08/17/2015).  Chest, abdomen, and pelvic CT scan on 08/09/2015 revealed radiation changes in the right upper lobe extending to the perihilar region. There was stable mild soft tissue prominence laterally.  There were stable bilateral adrenal metastases.  There were mildly prominent upper abdominal lymph nodes measuring up to 11 mm.  Bone  scan on 08/11/2015 revealed mild multifocal areas of increased activity about the cervical and lumbar spine and right shoulder, most likely degenerative. PET scan on 09/06/2015 revealed interval progression of nodal metastasis with new upper mediastinal hypermetabolic adenopathy, bilateral hypermetabolic lymph nodes adjacent to the tails of the parotid glands, and increased number of hypermetabolic periaortic lymph nodes.  There was stable to mildly increased bilateral adrenal metastasis.  Symptomatically, he remains anxious.  He has pruritus.  He has chronic low back pain.  Exam is stable.  Plan: 1.  Labs today:  CBC with diff, CMP, Mg, CEA. 2.  Review PET scan.  Imaging reveals progression of disease which correlates with increasing CEA.  Discuss discontinuation of carboplatin and Alimta.  Discuss treatment with nivolumab.  Side effects reviewed in detail.  Information provided. 3.  No chemotherapy today 4.  Preauth nivolumab (Opdivo). 5.  Rx: ondansetron. 6.  RTC in 1 week for MD assessment, labs (CBC with diff, CMP, MG, TSH, ACTH) and cycle #1 nivolumab.   Lequita Asal, MD  09/07/2015, 9:28 AM

## 2015-09-07 ENCOUNTER — Inpatient Hospital Stay (HOSPITAL_BASED_OUTPATIENT_CLINIC_OR_DEPARTMENT_OTHER): Payer: BLUE CROSS/BLUE SHIELD | Admitting: Hematology and Oncology

## 2015-09-07 ENCOUNTER — Inpatient Hospital Stay: Payer: BLUE CROSS/BLUE SHIELD | Attending: Hematology and Oncology

## 2015-09-07 ENCOUNTER — Other Ambulatory Visit: Payer: Self-pay | Admitting: *Deleted

## 2015-09-07 ENCOUNTER — Inpatient Hospital Stay: Payer: BLUE CROSS/BLUE SHIELD

## 2015-09-07 ENCOUNTER — Encounter: Payer: Self-pay | Admitting: Hematology and Oncology

## 2015-09-07 VITALS — BP 126/86 | HR 102 | Temp 96.5°F | Resp 18 | Wt 135.8 lb

## 2015-09-07 DIAGNOSIS — C7970 Secondary malignant neoplasm of unspecified adrenal gland: Secondary | ICD-10-CM

## 2015-09-07 DIAGNOSIS — C801 Malignant (primary) neoplasm, unspecified: Secondary | ICD-10-CM

## 2015-09-07 DIAGNOSIS — Z79899 Other long term (current) drug therapy: Secondary | ICD-10-CM | POA: Diagnosis not present

## 2015-09-07 DIAGNOSIS — C778 Secondary and unspecified malignant neoplasm of lymph nodes of multiple regions: Secondary | ICD-10-CM | POA: Diagnosis not present

## 2015-09-07 DIAGNOSIS — C3491 Malignant neoplasm of unspecified part of right bronchus or lung: Secondary | ICD-10-CM

## 2015-09-07 DIAGNOSIS — M545 Low back pain: Secondary | ICD-10-CM | POA: Diagnosis not present

## 2015-09-07 DIAGNOSIS — C7971 Secondary malignant neoplasm of right adrenal gland: Secondary | ICD-10-CM | POA: Insufficient documentation

## 2015-09-07 DIAGNOSIS — C3411 Malignant neoplasm of upper lobe, right bronchus or lung: Secondary | ICD-10-CM | POA: Diagnosis not present

## 2015-09-07 DIAGNOSIS — I509 Heart failure, unspecified: Secondary | ICD-10-CM | POA: Insufficient documentation

## 2015-09-07 DIAGNOSIS — D509 Iron deficiency anemia, unspecified: Secondary | ICD-10-CM | POA: Diagnosis not present

## 2015-09-07 DIAGNOSIS — J449 Chronic obstructive pulmonary disease, unspecified: Secondary | ICD-10-CM | POA: Diagnosis not present

## 2015-09-07 DIAGNOSIS — E569 Vitamin deficiency, unspecified: Secondary | ICD-10-CM | POA: Insufficient documentation

## 2015-09-07 DIAGNOSIS — E039 Hypothyroidism, unspecified: Secondary | ICD-10-CM | POA: Diagnosis not present

## 2015-09-07 DIAGNOSIS — C7800 Secondary malignant neoplasm of unspecified lung: Secondary | ICD-10-CM | POA: Diagnosis not present

## 2015-09-07 DIAGNOSIS — C7972 Secondary malignant neoplasm of left adrenal gland: Secondary | ICD-10-CM | POA: Insufficient documentation

## 2015-09-07 DIAGNOSIS — Z5111 Encounter for antineoplastic chemotherapy: Secondary | ICD-10-CM | POA: Diagnosis not present

## 2015-09-07 DIAGNOSIS — G8929 Other chronic pain: Secondary | ICD-10-CM

## 2015-09-07 DIAGNOSIS — Z87891 Personal history of nicotine dependence: Secondary | ICD-10-CM | POA: Diagnosis not present

## 2015-09-07 DIAGNOSIS — R569 Unspecified convulsions: Secondary | ICD-10-CM | POA: Diagnosis not present

## 2015-09-07 DIAGNOSIS — C3490 Malignant neoplasm of unspecified part of unspecified bronchus or lung: Secondary | ICD-10-CM

## 2015-09-07 DIAGNOSIS — R110 Nausea: Secondary | ICD-10-CM

## 2015-09-07 LAB — COMPREHENSIVE METABOLIC PANEL
ALT: 35 U/L (ref 17–63)
AST: 34 U/L (ref 15–41)
Albumin: 3.9 g/dL (ref 3.5–5.0)
Alkaline Phosphatase: 93 U/L (ref 38–126)
Anion gap: 9 (ref 5–15)
BUN: 18 mg/dL (ref 6–20)
CO2: 25 mmol/L (ref 22–32)
Calcium: 9 mg/dL (ref 8.9–10.3)
Chloride: 101 mmol/L (ref 101–111)
Creatinine, Ser: 0.82 mg/dL (ref 0.61–1.24)
GFR calc Af Amer: >60 mL/min (ref >60)
GFR calc non Af Amer: >60 mL/min (ref >60)
Glucose, Bld: 166 mg/dL — ABNORMAL HIGH (ref 65–99)
Potassium: 3.9 mmol/L (ref 3.5–5.1)
Sodium: 135 mmol/L (ref 135–145)
Total Bilirubin: 0.5 mg/dL (ref 0.3–1.2)
Total Protein: 7.1 g/dL (ref 6.5–8.1)

## 2015-09-07 LAB — CBC WITH DIFFERENTIAL/PLATELET
Basophils Absolute: 0 10*3/uL (ref 0–0.1)
Basophils Relative: 0 %
Eosinophils Absolute: 0 10*3/uL (ref 0–0.7)
Eosinophils Relative: 0 %
HCT: 34.4 % — ABNORMAL LOW (ref 40.0–52.0)
Hemoglobin: 12 g/dL — ABNORMAL LOW (ref 13.0–18.0)
Lymphocytes Relative: 9 %
Lymphs Abs: 0.5 10*3/uL — ABNORMAL LOW (ref 1.0–3.6)
MCH: 32.1 pg (ref 26.0–34.0)
MCHC: 35 g/dL (ref 32.0–36.0)
MCV: 91.8 fL (ref 80.0–100.0)
Monocytes Absolute: 0.2 10*3/uL (ref 0.2–1.0)
Monocytes Relative: 4 %
Neutro Abs: 5 10*3/uL (ref 1.4–6.5)
Neutrophils Relative %: 87 %
Platelets: 204 10*3/uL (ref 150–440)
RBC: 3.74 MIL/uL — ABNORMAL LOW (ref 4.40–5.90)
RDW: 18.2 % — ABNORMAL HIGH (ref 11.5–14.5)
WBC: 5.7 10*3/uL (ref 3.8–10.6)

## 2015-09-07 LAB — MAGNESIUM: Magnesium: 1.9 mg/dL (ref 1.7–2.4)

## 2015-09-07 MED ORDER — HYDROCODONE-ACETAMINOPHEN 5-325 MG PO TABS: 1.0000 | ORAL_TABLET | Freq: Four times a day (QID) | ORAL | 0 refills | Status: DC | PRN

## 2015-09-07 MED ORDER — HEPARIN SOD (PORK) LOCK FLUSH 100 UNIT/ML IV SOLN
500.0000 [IU] | Freq: Once | INTRAVENOUS | Status: AC
  Administered 2015-09-07: 500 [IU] via INTRAVENOUS

## 2015-09-07 MED ORDER — SODIUM CHLORIDE 0.9 % IJ SOLN
10.0000 mL | Freq: Once | INTRAMUSCULAR | Status: AC
  Administered 2015-09-07: 10 mL via INTRAVENOUS
  Filled 2015-09-07: qty 10

## 2015-09-07 MED ORDER — HEPARIN SOD (PORK) LOCK FLUSH 100 UNIT/ML IV SOLN
INTRAVENOUS | Status: AC
  Filled 2015-09-07: qty 5

## 2015-09-07 MED ORDER — ONDANSETRON HCL 8 MG PO TABS: 8.0000 mg | ORAL_TABLET | Freq: Three times a day (TID) | ORAL | 3 refills | Status: AC | PRN

## 2015-09-07 NOTE — Progress Notes (Signed)
Patient needs refill on zofran

## 2015-09-07 NOTE — Patient Instructions (Signed)
Nivolumab injection What is this medicine? NIVOLUMAB (nye VOL ue mab) is a monoclonal antibody. It is used to treat melanoma, lung cancer, kidney cancer, and Hodgkin lymphoma. This medicine may be used for other purposes; ask your health care provider or pharmacist if you have questions. What should I tell my health care provider before I take this medicine? They need to know if you have any of these conditions: -diabetes -immune system problems -kidney disease -liver disease -lung disease -organ transplant -stomach or intestine problems -thyroid disease -an unusual or allergic reaction to nivolumab, other medicines, foods, dyes, or preservatives -pregnant or trying to get pregnant -breast-feeding How should I use this medicine? This medicine is for infusion into a vein. It is given by a health care professional in a hospital or clinic setting. A special MedGuide will be given to you before each treatment. Be sure to read this information carefully each time. Talk to your pediatrician regarding the use of this medicine in children. Special care may be needed. Overdosage: If you think you have taken too much of this medicine contact a poison control center or emergency room at once. NOTE: This medicine is only for you. Do not share this medicine with others. What if I miss a dose? It is important not to miss your dose. Call your doctor or health care professional if you are unable to keep an appointment. What may interact with this medicine? Interactions have not been studied. Give your health care provider a list of all the medicines, herbs, non-prescription drugs, or dietary supplements you use. Also tell them if you smoke, drink alcohol, or use illegal drugs. Some items may interact with your medicine. This list may not describe all possible interactions. Give your health care provider a list of all the medicines, herbs, non-prescription drugs, or dietary supplements you use. Also tell  them if you smoke, drink alcohol, or use illegal drugs. Some items may interact with your medicine. What should I watch for while using this medicine? This drug may make you feel generally unwell. Continue your course of treatment even though you feel ill unless your doctor tells you to stop. You may need blood work done while you are taking this medicine. Do not become pregnant while taking this medicine or for 5 months after stopping it. Women should inform their doctor if they wish to become pregnant or think they might be pregnant. There is a potential for serious side effects to an unborn child. Talk to your health care professional or pharmacist for more information. Do not breast-feed an infant while taking this medicine. What side effects may I notice from receiving this medicine? Side effects that you should report to your doctor or health care professional as soon as possible: -allergic reactions like skin rash, itching or hives, swelling of the face, lips, or tongue -black, tarry stools -blood in the urine -bloody or watery diarrhea -changes in vision -change in sex drive -changes in emotions or moods -chest pain -confusion -cough -decreased appetite -diarrhea -facial flushing -feeling faint or lightheaded -fever, chills -hair loss -hallucination, loss of contact with reality -headache -irritable -joint pain -loss of memory -muscle pain -muscle weakness -seizures -shortness of breath -signs and symptoms of high blood sugar such as dizziness; dry mouth; dry skin; fruity breath; nausea; stomach pain; increased hunger or thirst; increased urination -signs and symptoms of kidney injury like trouble passing urine or change in the amount of urine -signs and symptoms of liver injury like dark yellow or  brown urine; general ill feeling or flu-like symptoms; light-colored stools; loss of appetite; nausea; right upper belly pain; unusually weak or tired; yellowing of the eyes or  skin -stiff neck -swelling of the ankles, feet, hands -weight gain Side effects that usually do not require medical attention (report to your doctor or health care professional if they continue or are bothersome): -bone pain -constipation -tiredness -vomiting This list may not describe all possible side effects. Call your doctor for medical advice about side effects. You may report side effects to FDA at 1-800-FDA-1088. Where should I keep my medicine? This drug is given in a hospital or clinic and will not be stored at home. NOTE: This sheet is a summary. It may not cover all possible information. If you have questions about this medicine, talk to your doctor, pharmacist, or health care provider.    2016, [employer]. (2014-06-08 10:03:42)

## 2015-09-08 ENCOUNTER — Other Ambulatory Visit: Payer: Self-pay | Admitting: *Deleted

## 2015-09-08 DIAGNOSIS — C3491 Malignant neoplasm of unspecified part of right bronchus or lung: Secondary | ICD-10-CM

## 2015-09-08 LAB — CEA: CEA: 115.6 ng/mL — ABNORMAL HIGH (ref 0.0–4.7)

## 2015-09-11 ENCOUNTER — Telehealth: Payer: Self-pay | Admitting: *Deleted

## 2015-09-11 NOTE — Telephone Encounter (Signed)
Asking if he is to take steroids before his chemo treatment this time. Please advise

## 2015-09-11 NOTE — Telephone Encounter (Signed)
Darryl Hatfield per VO Dr Mike Gip, no steroids. Left message on VM

## 2015-09-13 ENCOUNTER — Other Ambulatory Visit: Payer: Self-pay | Admitting: Hematology and Oncology

## 2015-09-13 DIAGNOSIS — C3490 Malignant neoplasm of unspecified part of unspecified bronchus or lung: Secondary | ICD-10-CM

## 2015-09-14 ENCOUNTER — Inpatient Hospital Stay (HOSPITAL_BASED_OUTPATIENT_CLINIC_OR_DEPARTMENT_OTHER): Payer: BLUE CROSS/BLUE SHIELD | Admitting: Hematology and Oncology

## 2015-09-14 ENCOUNTER — Inpatient Hospital Stay: Payer: BLUE CROSS/BLUE SHIELD

## 2015-09-14 ENCOUNTER — Other Ambulatory Visit: Payer: Self-pay | Admitting: *Deleted

## 2015-09-14 ENCOUNTER — Other Ambulatory Visit: Payer: Self-pay | Admitting: Hematology and Oncology

## 2015-09-14 ENCOUNTER — Inpatient Hospital Stay (HOSPITAL_BASED_OUTPATIENT_CLINIC_OR_DEPARTMENT_OTHER): Payer: BLUE CROSS/BLUE SHIELD

## 2015-09-14 VITALS — BP 145/90 | HR 91 | Temp 95.9°F | Wt 136.8 lb

## 2015-09-14 DIAGNOSIS — C3491 Malignant neoplasm of unspecified part of right bronchus or lung: Secondary | ICD-10-CM

## 2015-09-14 DIAGNOSIS — D509 Iron deficiency anemia, unspecified: Secondary | ICD-10-CM

## 2015-09-14 DIAGNOSIS — C7970 Secondary malignant neoplasm of unspecified adrenal gland: Secondary | ICD-10-CM

## 2015-09-14 DIAGNOSIS — C3411 Malignant neoplasm of upper lobe, right bronchus or lung: Secondary | ICD-10-CM

## 2015-09-14 DIAGNOSIS — C7972 Secondary malignant neoplasm of left adrenal gland: Secondary | ICD-10-CM

## 2015-09-14 DIAGNOSIS — C3490 Malignant neoplasm of unspecified part of unspecified bronchus or lung: Secondary | ICD-10-CM

## 2015-09-14 DIAGNOSIS — Z79899 Other long term (current) drug therapy: Secondary | ICD-10-CM

## 2015-09-14 DIAGNOSIS — C778 Secondary and unspecified malignant neoplasm of lymph nodes of multiple regions: Secondary | ICD-10-CM

## 2015-09-14 DIAGNOSIS — C7800 Secondary malignant neoplasm of unspecified lung: Secondary | ICD-10-CM

## 2015-09-14 DIAGNOSIS — M545 Low back pain: Secondary | ICD-10-CM

## 2015-09-14 DIAGNOSIS — G8929 Other chronic pain: Secondary | ICD-10-CM

## 2015-09-14 DIAGNOSIS — R1013 Epigastric pain: Secondary | ICD-10-CM

## 2015-09-14 DIAGNOSIS — C7971 Secondary malignant neoplasm of right adrenal gland: Secondary | ICD-10-CM

## 2015-09-14 LAB — CBC WITH DIFFERENTIAL/PLATELET
Basophils Absolute: 0 10*3/uL (ref 0–0.1)
Basophils Relative: 0 %
Eosinophils Absolute: 0 10*3/uL (ref 0–0.7)
Eosinophils Relative: 0 %
HCT: 35 % — ABNORMAL LOW (ref 40.0–52.0)
Hemoglobin: 12.1 g/dL — ABNORMAL LOW (ref 13.0–18.0)
Lymphocytes Relative: 7 %
Lymphs Abs: 0.8 10*3/uL — ABNORMAL LOW (ref 1.0–3.6)
MCH: 31.6 pg (ref 26.0–34.0)
MCHC: 34.5 g/dL (ref 32.0–36.0)
MCV: 91.8 fL (ref 80.0–100.0)
Monocytes Absolute: 1 10*3/uL (ref 0.2–1.0)
Monocytes Relative: 9 %
Neutro Abs: 9.8 10*3/uL — ABNORMAL HIGH (ref 1.4–6.5)
Neutrophils Relative %: 84 %
Platelets: 147 10*3/uL — ABNORMAL LOW (ref 150–440)
RBC: 3.81 MIL/uL — ABNORMAL LOW (ref 4.40–5.90)
RDW: 17.2 % — ABNORMAL HIGH (ref 11.5–14.5)
WBC: 11.6 10*3/uL — ABNORMAL HIGH (ref 3.8–10.6)

## 2015-09-14 LAB — COMPREHENSIVE METABOLIC PANEL
ALT: 35 U/L (ref 17–63)
AST: 25 U/L (ref 15–41)
Albumin: 3.8 g/dL (ref 3.5–5.0)
Alkaline Phosphatase: 82 U/L (ref 38–126)
Anion gap: 7 (ref 5–15)
BUN: 18 mg/dL (ref 6–20)
CO2: 27 mmol/L (ref 22–32)
Calcium: 9 mg/dL (ref 8.9–10.3)
Chloride: 102 mmol/L (ref 101–111)
Creatinine, Ser: 0.81 mg/dL (ref 0.61–1.24)
GFR calc Af Amer: >60 mL/min (ref >60)
GFR calc non Af Amer: >60 mL/min (ref >60)
Glucose, Bld: 104 mg/dL — ABNORMAL HIGH (ref 65–99)
Potassium: 3.7 mmol/L (ref 3.5–5.1)
Sodium: 136 mmol/L (ref 135–145)
Total Bilirubin: 0.4 mg/dL (ref 0.3–1.2)
Total Protein: 7.1 g/dL (ref 6.5–8.1)

## 2015-09-14 LAB — TSH: TSH: 0.041 u[IU]/mL — ABNORMAL LOW (ref 0.350–4.500)

## 2015-09-14 LAB — MAGNESIUM: Magnesium: 1.8 mg/dL (ref 1.7–2.4)

## 2015-09-14 MED ORDER — HEPARIN SOD (PORK) LOCK FLUSH 100 UNIT/ML IV SOLN
500.0000 [IU] | Freq: Once | INTRAVENOUS | Status: AC | PRN
  Administered 2015-09-14: 500 [IU]
  Filled 2015-09-14: qty 5

## 2015-09-14 MED ORDER — SODIUM CHLORIDE 0.9 % IV SOLN
Freq: Once | INTRAVENOUS | Status: AC
  Administered 2015-09-14: 12:00:00 via INTRAVENOUS
  Filled 2015-09-14: qty 1000

## 2015-09-14 MED ORDER — NIVOLUMAB CHEMO INJECTION 100 MG/10ML
240.0000 mg | Freq: Once | INTRAVENOUS | Status: AC
  Administered 2015-09-14: 240 mg via INTRAVENOUS
  Filled 2015-09-14: qty 20

## 2015-09-14 MED ORDER — SODIUM CHLORIDE 0.9% FLUSH
10.0000 mL | INTRAVENOUS | Status: DC | PRN
  Administered 2015-09-14: 10 mL
  Filled 2015-09-14: qty 10

## 2015-09-14 NOTE — Progress Notes (Signed)
[facility] day:  09/14/15    Chief Complaint: Darryl Hatfield is a 55 y.o. 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  HPI:  The patient was last seen in the medical oncology clinic on 09/07/2015.  At that time, PET scan from 09/06/2015 was reviewed.  Imaging revealed progressive disease. CEA was increasing. We discussed discontinuation of carboplatin and Alimta. I discussed second line therapy with nivolumab. Side effects were reviewed.  Symptomatically, he notes some mild swelling in his feet and ankles.  He is not itching.  He is using hydroxyzine rarely.  He had some mild abdominal discomfort/nausea at visit.  He was prescribed ondansetron.  He is not taking a PPI.   Past Medical History:  Diagnosis Date  . CHF (congestive heart failure) ([place])   . COPD (chronic obstructive pulmonary disease) ([place])   . Hypothyroidism   . Lung cancer ([place]) 2015   RIght   . Seizures ([place])     Past Surgical History:  Procedure Laterality Date  . BACK SURGERY    . PORTACATH PLACEMENT Right     Family History  Problem Relation Age of Onset  . Hypertension Other     Social History:  reports that he quit smoking about 2 years ago. He has a 40.00 pack-year smoking history. He has never used smokeless tobacco. He reports that he does not drink alcohol or use drugs.  He is on disability.  He is accompanied by his wife, Darryl Hatfield.  Allergies:  Allergies  Allergen Reactions  . No Known Allergies     Current Medications: Current Outpatient Prescriptions  Medication Sig Dispense Refill  . acetaminophen (TYLENOL) 325 MG tablet Take by mouth.    Marland Kitchen albuterol (PROVENTIL) (2.5 MG/3ML) 0.083% nebulizer solution Take 3 mLs (2.5 mg total) by nebulization every 6 (six) hours as needed for wheezing or shortness of breath. 75 mL 2  . DULoxetine (CYMBALTA)  30 MG capsule Take 30 mg by mouth daily.    . Fluticasone-Salmeterol (ADVAIR DISKUS) 250-50 MCG/DOSE AEPB Inhale into the lungs.    . folic acid (FOLVITE) 1 MG tablet TAKE 1 TABLET BY MOUTH DAILY START 5-7 DAYS BEFORE ALIMTA CHEMO, CONTINUE UNTIL 21 DAYS AFTER ALIMTA 100 tablet 3  . gabapentin (NEURONTIN) 100 MG capsule Take 1 capsule (100 mg total) by mouth 3 (three) times daily. 90 capsule 4  . HYDROcodone-acetaminophen (NORCO) 5-325 MG tablet Take 1 tablet by mouth every 6 (six) hours as needed for moderate pain. 30 tablet 0  . hydrOXYzine (ATARAX/VISTARIL) 10 MG tablet Take 1 tablet (10 mg total) by mouth 3 (three) times daily as needed for itching. 60 tablet 1  . levothyroxine (SYNTHROID, LEVOTHROID) 88 MCG tablet Take 1 tablet (88 mcg total) by mouth daily before breakfast. 30 tablet 0  . LORazepam (ATIVAN) 0.5 MG tablet Take 1 tablet (0.5 mg total) by mouth every 8 (eight) hours as needed for anxiety. 30 tablet 0  . Multiple Vitamin (MULTI-VITAMINS) TABS Take by mouth.    . ondansetron (ZOFRAN) 8 MG tablet Take 1 tablet (8 mg total) by mouth every 8 (eight) hours as needed for nausea or vomiting. 30 tablet 3  . QUEtiapine (SEROQUEL) 50 MG tablet Take by mouth.    . tiotropium (SPIRIVA) 18 MCG inhalation capsule Place 18 mcg into inhaler and inhale daily.    Marland Kitchen  dexamethasone (DECADRON) 4 MG tablet Take 1 tab two times a day the day before Alimta chemo. Take 2 tabs two times a day starting the day after chemo for 3 days. (Patient not taking: Reported on 09/14/2015) 30 tablet 1   No current facility-administered medications for this visit.    Facility-Administered Medications Ordered in Other Visits  Medication Dose Route Frequency Provider Last Rate Last Dose  . sodium chloride 0.9 % injection 10 mL  10 mL Intracatheter PRN Forest Gleason, MD   10 mL at 06/27/14 1338  . sodium chloride 0.9 % injection 10 mL  10 mL Intracatheter PRN Leia Alf, MD   10 mL at 08/22/14 0851  . sodium chloride  flush (NS) 0.9 % injection 10 mL  10 mL Intravenous PRN Lequita Asal, MD        Review of Systems:  GENERAL:  Feels "ok".  No fevers or sweats.  Weight up 1 pound.  Maximum weight 135 pounds. PERFORMANCE STATUS (ECOG):  2 HEENT:  No visual changes, runny nose, sore throat, mouth sores or tenderness. Lungs: Shortness of breath with exertion (stable).  No cough.  No hemoptysis. Cardiac:  No chest pain, palpitations, orthopnea, or PND. GI:  No nausea, vomiting, diarrhea, melena or hematochezia.  No prior EGD.  Colonoscopy prior to diagnosis. GU:  No urgency, frequency, dysuria, or hematuria. Musculoskeletal:  Chronic back pain.  No joint pain.  No muscle tenderness. Extremities:  Mild lower extremity swelling.  No pain. Skin:  Itchy skin, improved.  Rarely taking hydroxyzine.  No rashes or skin changes. Neuro:  No headache, numbness or weakness, balance or coordination issues. Endocrine:  No diabetes. Thyroid disease on Synthroid.  No hot flashes or night sweats. Psych:  High anxiety (stable).  No mood changes or depression. Pain:  No focal pain. Review of systems:  All other systems reviewed and found to be negative.  Physical Exam: Blood pressure (!) 145/90, pulse 91, temperature (!) 95.9 F (35.5 C), temperature source Tympanic, weight 136 lb 12.7 oz (62.1 kg), SpO2 100 %. GENERAL:  Thin gentleman sitting comfortably in the exam room in no acute distress.  He is anxious. MENTAL STATUS:  Alert and oriented to person, place and time. HEAD:  Black hair with slight graying.  Mustache.  Temporal wasting.  Normocephalic, atraumatic, face symmetric, no Cushingoid features. EYES:  Glasses.  Blue eyes.  Pupils equal round and reactive to light and accomodation.  No conjunctivitis or scleral icterus. ENT:  Oropharynx clear without lesion.  Dentures.  Tongue normal. Mucous membranes moist.  RESPIRATORY:  Clear to auscultation without rales, wheezes or rhonchi. CARDIOVASCULAR:  Regular rate  and rhythm without murmur, rub or gallop.  No JVD. ABDOMEN:  Soft, non-tender with active bowel sounds and no hepatosplenomegaly.  No masses. SKIN:  No rashes, ulcers or lesions. EXTREMITIES: Subtle (trace) lower extremity edema.  No skin discoloration or tenderness.  No palpable cords. LYMPH NODES: No palpable cervical, supraclavicular, axillary or inguinal adenopathy.  NEUROLOGICAL: Unremarkable. PSYCH:  Very anxious.    Infusion on 09/14/2015  Component Date Value Ref Range Status  . Magnesium 09/14/2015 1.8  1.7 - 2.4 mg/dL Final  . WBC 09/14/2015 11.6* 3.8 - 10.6 K/uL Final  . RBC 09/14/2015 3.81* 4.40 - 5.90 MIL/uL Final  . Hemoglobin 09/14/2015 12.1* 13.0 - 18.0 g/dL Final  . HCT 09/14/2015 35.0* 40.0 - 52.0 % Final  . MCV 09/14/2015 91.8  80.0 - 100.0 fL Final  . [facility] 09/14/2015 31.6  26.0 - 34.0 pg Final  . MCHC 09/14/2015 34.5  32.0 - 36.0 g/dL Final  . RDW 09/14/2015 17.2* 11.5 - 14.5 % Final  . Platelets 09/14/2015 147* 150 - 440 K/uL Final  . Neutrophils Relative % 09/14/2015 84  % Final  . Neutro Abs 09/14/2015 9.8* 1.4 - 6.5 K/uL Final  . Lymphocytes Relative 09/14/2015 7  % Final  . Lymphs Abs 09/14/2015 0.8* 1.0 - 3.6 K/uL Final  . Monocytes Relative 09/14/2015 9  % Final  . Monocytes Absolute 09/14/2015 1.0  0.2 - 1.0 K/uL Final  . Eosinophils Relative 09/14/2015 0  % Final  . Eosinophils Absolute 09/14/2015 0.0  0 - 0.7 K/uL Final  . Basophils Relative 09/14/2015 0  % Final  . Basophils Absolute 09/14/2015 0.0  0 - 0.1 K/uL Final  . Sodium 09/14/2015 136  135 - 145 mmol/L Final  . Potassium 09/14/2015 3.7  3.5 - 5.1 mmol/L Final  . Chloride 09/14/2015 102  101 - 111 mmol/L Final  . CO2 09/14/2015 27  22 - 32 mmol/L Final  . Glucose, Bld 09/14/2015 104* 65 - 99 mg/dL Final  . BUN 09/14/2015 18  6 - 20 mg/dL Final  . Creatinine, Ser 09/14/2015 0.81  0.61 - 1.24 mg/dL Final  . Calcium 09/14/2015 9.0  8.9 - 10.3 mg/dL Final  . Total Protein 09/14/2015 7.1  6.5 -  8.1 g/dL Final  . Albumin 09/14/2015 3.8  3.5 - 5.0 g/dL Final  . AST 09/14/2015 25  15 - 41 U/L Final  . ALT 09/14/2015 35  17 - 63 U/L Final  . Alkaline Phosphatase 09/14/2015 82  38 - 126 U/L Final  . Total Bilirubin 09/14/2015 0.4  0.3 - 1.2 mg/dL Final  . GFR calc non Af Amer 09/14/2015 >60  >60 mL/min Final  . GFR calc Af Amer 09/14/2015 >60  >60 mL/min Final   Comment: (NOTE) The eGFR has been calculated using the CKD EPI equation. This calculation has not been validated in all clinical situations. eGFR's persistently <60 mL/min signify possible Chronic Hatfield Disease.   Georgiann Hahn gap 09/14/2015 7  5 - 15 Final    Assessment:  Darryl Hatfield is a 55 y.o. 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    He was diagnosed with stage IIIA squamous cell lung cancer in 07/02/2013.  PET scan on 06/23/2013 revealed a 1.5 cm right upper lobe nodule (SUV 7.4) concerning for primary bronchogenic carcinoma.  There were hypermetabolic ipsilateral hilar and mediastinal lymph node metastasis. There was malignant range FDG uptake associated with bilateral level 2 cervical lymph nodes of uncertain significance.  EBUS assisted biopsy of a right lower paratracheal and right hilar lymph node on 07/02/2013 confirmed non-small cell carcinoma, favoring squamous cell carcinoma.  Clinical stage was T1N2M0.  He received concurrent chemotherapy and radiation.  He received 6,000 cGy from 08/05/2014 - 09/14/2013.  He received weekly carboplatin and Taxol x 6 from 08/02/2013 - 10/25/2013.  Course was complicated by a late pneumothorax secondary to port placement.  He also had a seizure in 03/2014 with subsequent fracture of lumbar vertebrae and rod placement.  PET scan on 04/12/2015 revealed mild soft tissue fullness along the posterior right upper lobe/perihilar region, with associated mild vague hypermetabolism (SUV 2.6).  There were  hypermetabolic bilateral adrenal metastases, right (2.7 cm; SUV 10) greater than left (2.1 cm; SUV 7.8).  There was small hypermetabolic  upper abdominal nodal metastases (9 mm porta hepatitis (SUV 7.3) and 7 mm portacaval node (SUV 5.3)).  CT guided right adrenal biopsy on 04/25/2015 revealed metastatic adenocarcinoma of lung primary.  TTF-1 was positive.  PD-L1 testing was negative (< 1%).  EGFR revealed no mutation.  There was no ALK rearrangement.  ROS1 revealed no result (unsuccessful).  RET gene rearrangement was negative.    CEA was 36.5 on 05/25/2015, 34 on 06/15/2015, and 35.3 on 07/06/2015, 48.8 on 07/27/2015, 62.5 on 08/17/2015, and 115.6 on 09/07/2015.  He has a mild normocytic anemia.  Anemia work-up on 04/05/2015 revealed iron deficiency anemia (ferritin 48, iron saturation 12%, TIBC 310).  Sedimentation rate was 27.  B12 was 255 (low normal) with a folate 13.8 (normal).  MMA was normal (246).  TSH was 8.840 (high) with normal T4 and T3U, and free thyroxine index.   His last colonoscopy was just prior to his diagnosis.  He has never had an EGD.  He denies any melena or hematochezia.  Diet is poor.  He is on oral iron.  He received B12 every 9 weeks (05/18/2015 - 07/20/2015).  He is on folic acid.  Echo on 08/18/2014 revealed an EF of 45%.  He received 5 cycles of carboplatin and Alimta  (05/25/2015 - 08/17/2015).  Chest, abdomen, and pelvic CT scan on 08/09/2015 revealed radiation changes in the right upper lobe extending to the perihilar region. There was stable mild soft tissue prominence laterally.  There were stable bilateral adrenal metastases.  There were mildly prominent upper abdominal lymph nodes measuring up to 11 mm.  Bone scan on 08/11/2015 revealed mild multifocal areas of increased activity about the cervical and lumbar spine and right shoulder, most likely degenerative. PET scan on 09/06/2015 revealed interval progression of nodal metastasis with new upper mediastinal  hypermetabolic adenopathy, bilateral hypermetabolic lymph nodes adjacent to the tails of the parotid glands, and increased number of hypermetabolic periaortic lymph nodes.  There was stable to mildly increased bilateral adrenal metastasis.  Symptomatically, he has some mild upper abdominal discomfort likely due to gastritis.  He has trace bilateral lower extremity edema.  Plan: 1.  Labs today:  CBC with diff, CMP, Mg, TSH, ACTH. 2.  Cycle #1 nivolumab today.  Patient consented to treatment. 3.  Discuss lower extremity edema (very mild).  No evidence of CHF.  Review last echo-done. 4.  Discuss initiation of a PPI (omeprazole) for probable gastritis (stress induced). 5.  RTC in 2 weeks for MD assessment, labs (CBC with diff, CMP, Mg, CEA) and cycle #2 nivolumab.   Lequita Asal, MD  09/14/2015, 10:54 AM

## 2015-09-14 NOTE — Progress Notes (Signed)
Patient is here today for follow up, he does have some swelling in his ankles. He also gets SOB when doing things such as taking out the trash

## 2015-09-15 ENCOUNTER — Telehealth: Payer: Self-pay

## 2015-09-15 DIAGNOSIS — C3490 Malignant neoplasm of unspecified part of unspecified bronchus or lung: Secondary | ICD-10-CM

## 2015-09-15 LAB — ACTH: C206 ACTH: 2.2 pg/mL — ABNORMAL LOW (ref 7.2–63.3)

## 2015-09-15 NOTE — Telephone Encounter (Signed)
-----   Message from Lequita Asal, MD sent at 09/14/2015  4:40 PM EDT ----- Regarding: Can we add a free T4?  TSH very low.  Can we add a free T4?  M  ----- Message ----- From: Interface, Lab In [place] Sent: 09/14/2015   9:50 AM To: Lequita Asal, MD

## 2015-09-15 NOTE — Telephone Encounter (Signed)
Free t4 has been added to lab work. Ordered and josh will take care of it

## 2015-09-16 ENCOUNTER — Emergency Department: Payer: BLUE CROSS/BLUE SHIELD

## 2015-09-16 ENCOUNTER — Other Ambulatory Visit: Payer: Self-pay

## 2015-09-16 ENCOUNTER — Encounter: Payer: Self-pay | Admitting: Hematology and Oncology

## 2015-09-16 ENCOUNTER — Emergency Department
Admission: EM | Admit: 2015-09-16 | Discharge: 2015-09-17 | Payer: BLUE CROSS/BLUE SHIELD | Attending: Emergency Medicine | Admitting: Emergency Medicine

## 2015-09-16 DIAGNOSIS — E039 Hypothyroidism, unspecified: Secondary | ICD-10-CM | POA: Diagnosis not present

## 2015-09-16 DIAGNOSIS — I213 ST elevation (STEMI) myocardial infarction of unspecified site: Secondary | ICD-10-CM | POA: Diagnosis present

## 2015-09-16 DIAGNOSIS — F0390 Unspecified dementia without behavioral disturbance: Secondary | ICD-10-CM | POA: Diagnosis not present

## 2015-09-16 DIAGNOSIS — I509 Heart failure, unspecified: Secondary | ICD-10-CM | POA: Insufficient documentation

## 2015-09-16 DIAGNOSIS — R079 Chest pain, unspecified: Secondary | ICD-10-CM | POA: Diagnosis not present

## 2015-09-16 DIAGNOSIS — R1013 Epigastric pain: Secondary | ICD-10-CM | POA: Insufficient documentation

## 2015-09-16 DIAGNOSIS — Z85118 Personal history of other malignant neoplasm of bronchus and lung: Secondary | ICD-10-CM | POA: Insufficient documentation

## 2015-09-16 DIAGNOSIS — R7989 Other specified abnormal findings of blood chemistry: Secondary | ICD-10-CM | POA: Diagnosis not present

## 2015-09-16 DIAGNOSIS — Z79899 Other long term (current) drug therapy: Secondary | ICD-10-CM | POA: Insufficient documentation

## 2015-09-16 DIAGNOSIS — J449 Chronic obstructive pulmonary disease, unspecified: Secondary | ICD-10-CM | POA: Insufficient documentation

## 2015-09-16 DIAGNOSIS — Z87891 Personal history of nicotine dependence: Secondary | ICD-10-CM | POA: Diagnosis not present

## 2015-09-16 DIAGNOSIS — M545 Low back pain, unspecified: Secondary | ICD-10-CM

## 2015-09-16 DIAGNOSIS — C3411 Malignant neoplasm of upper lobe, right bronchus or lung: Secondary | ICD-10-CM | POA: Diagnosis present

## 2015-09-16 DIAGNOSIS — R0602 Shortness of breath: Secondary | ICD-10-CM | POA: Diagnosis present

## 2015-09-16 DIAGNOSIS — R778 Other specified abnormalities of plasma proteins: Secondary | ICD-10-CM

## 2015-09-16 LAB — CBC WITH DIFFERENTIAL/PLATELET
Basophils Absolute: 0 10*3/uL (ref 0–0.1)
EOS ABS: 0 10*3/uL (ref 0–0.7)
HCT: 37.3 % — ABNORMAL LOW (ref 40.0–52.0)
Hemoglobin: 12.5 g/dL — ABNORMAL LOW (ref 13.0–18.0)
Lymphocytes Relative: 7 %
Lymphs Abs: 0.9 10*3/uL — ABNORMAL LOW (ref 1.0–3.6)
MCH: 31.7 pg (ref 26.0–34.0)
MCHC: 33.6 g/dL (ref 32.0–36.0)
MCV: 94.4 fL (ref 80.0–100.0)
Monocytes Absolute: 0.7 10*3/uL (ref 0.2–1.0)
Neutro Abs: 10.8 10*3/uL — ABNORMAL HIGH (ref 1.4–6.5)
PLATELETS: 111 10*3/uL — AB (ref 150–440)
RBC: 3.95 MIL/uL — ABNORMAL LOW (ref 4.40–5.90)
RDW: 17.3 % — AB (ref 11.5–14.5)
WBC: 12.4 10*3/uL — ABNORMAL HIGH (ref 3.8–10.6)

## 2015-09-16 LAB — COMPREHENSIVE METABOLIC PANEL
ALBUMIN: 3.9 g/dL (ref 3.5–5.0)
ALK PHOS: 93 U/L (ref 38–126)
ALT: 37 U/L (ref 17–63)
ANION GAP: 12 (ref 5–15)
AST: 33 U/L (ref 15–41)
BUN: 31 mg/dL — ABNORMAL HIGH (ref 6–20)
CALCIUM: 8.9 mg/dL (ref 8.9–10.3)
CO2: 27 mmol/L (ref 22–32)
CREATININE: 1.01 mg/dL (ref 0.61–1.24)
Chloride: 99 mmol/L — ABNORMAL LOW (ref 101–111)
GFR calc non Af Amer: >60 mL/min (ref >60)
GLUCOSE: 222 mg/dL — AB (ref 65–99)
Potassium: 4.1 mmol/L (ref 3.5–5.1)
Sodium: 138 mmol/L (ref 135–145)
TOTAL PROTEIN: 6.9 g/dL (ref 6.5–8.1)

## 2015-09-16 LAB — URINALYSIS COMPLETE WITH MICROSCOPIC (ARMC ONLY)
BILIRUBIN URINE: NEGATIVE
Bacteria, UA: NONE SEEN
GLUCOSE, UA: NEGATIVE mg/dL
HGB URINE DIPSTICK: NEGATIVE
Ketones, ur: NEGATIVE mg/dL
Leukocytes, UA: NEGATIVE
NITRITE: NEGATIVE
Protein, ur: NEGATIVE mg/dL
SPECIFIC GRAVITY, URINE: 1.015 (ref 1.005–1.030)
Squamous Epithelial / LPF: NONE SEEN
pH: 5 (ref 5.0–8.0)

## 2015-09-16 LAB — TROPONIN I: TROPONIN I: 0.71 ng/mL — AB (ref <0.03)

## 2015-09-16 LAB — BRAIN NATRIURETIC PEPTIDE: B Natriuretic Peptide: 97 pg/mL (ref 0.0–100.0)

## 2015-09-16 LAB — APTT: aPTT: 29 seconds (ref 24–36)

## 2015-09-16 LAB — PROTIME-INR
INR: 1.43
Prothrombin Time: 17.6 seconds — ABNORMAL HIGH (ref 11.4–15.2)

## 2015-09-16 MED ORDER — ONDANSETRON HCL 4 MG/2ML IJ SOLN
INTRAMUSCULAR | Status: AC
  Administered 2015-09-16: 4 mg
  Filled 2015-09-16: qty 2

## 2015-09-16 MED ORDER — HEPARIN BOLUS VIA INFUSION
3700.0000 [IU] | Freq: Once | INTRAVENOUS | Status: AC
  Administered 2015-09-16: 3700 [IU] via INTRAVENOUS
  Filled 2015-09-16: qty 3700

## 2015-09-16 MED ORDER — ASPIRIN 81 MG PO CHEW
CHEWABLE_TABLET | ORAL | Status: AC
  Filled 2015-09-16: qty 4

## 2015-09-16 MED ORDER — ALBUTEROL SULFATE (2.5 MG/3ML) 0.083% IN NEBU: 5.0000 mg | INHALATION_SOLUTION | Freq: Once | RESPIRATORY_TRACT | Status: DC

## 2015-09-16 MED ORDER — MORPHINE SULFATE (PF) 2 MG/ML IV SOLN
INTRAVENOUS | Status: AC
  Filled 2015-09-16: qty 1

## 2015-09-16 MED ORDER — NITROGLYCERIN 2 % TD OINT
0.5000 [in_us] | TOPICAL_OINTMENT | Freq: Once | TRANSDERMAL | Status: AC
  Administered 2015-09-16: 0.5 [in_us] via TOPICAL

## 2015-09-16 MED ORDER — SODIUM CHLORIDE 0.9 % IV SOLN
Freq: Once | INTRAVENOUS | Status: AC
  Administered 2015-09-16: 22:00:00 via INTRAVENOUS

## 2015-09-16 MED ORDER — NITROGLYCERIN 2 % TD OINT
TOPICAL_OINTMENT | TRANSDERMAL | Status: AC
  Filled 2015-09-16: qty 1

## 2015-09-16 MED ORDER — MORPHINE SULFATE (PF) 2 MG/ML IV SOLN
2.0000 mg | Freq: Once | INTRAVENOUS | Status: AC
  Administered 2015-09-16: 2 mg via INTRAVENOUS

## 2015-09-16 MED ORDER — HEPARIN SODIUM (PORCINE) 5000 UNIT/ML IJ SOLN
60.0000 [IU]/kg | Freq: Once | INTRAMUSCULAR | Status: DC
  Filled 2015-09-16: qty 1

## 2015-09-16 MED ORDER — ASPIRIN 81 MG PO CHEW
324.0000 mg | CHEWABLE_TABLET | Freq: Once | ORAL | Status: AC
  Administered 2015-09-16: 324 mg via ORAL

## 2015-09-16 MED ORDER — LORAZEPAM 2 MG/ML IJ SOLN
0.5000 mg | Freq: Once | INTRAMUSCULAR | Status: AC
  Administered 2015-09-16: 0.5 mg via INTRAVENOUS

## 2015-09-16 MED ORDER — NITROGLYCERIN 0.4 MG SL SUBL: 0.4000 mg | SUBLINGUAL_TABLET | SUBLINGUAL | Status: DC | PRN

## 2015-09-16 MED ORDER — CLOPIDOGREL BISULFATE 75 MG PO TABS
300.0000 mg | ORAL_TABLET | Freq: Once | ORAL | Status: AC
Start: 2015-09-16 — End: 2015-09-16
  Administered 2015-09-16: 300 mg via ORAL
  Filled 2015-09-16: qty 4

## 2015-09-16 MED ORDER — LORAZEPAM 2 MG/ML IJ SOLN
INTRAMUSCULAR | Status: AC
  Filled 2015-09-16: qty 1

## 2015-09-16 MED ORDER — HEPARIN (PORCINE) IN NACL 100-0.45 UNIT/ML-% IJ SOLN
700.0000 [IU]/h | INTRAMUSCULAR | Status: DC
  Administered 2015-09-16: 750 [IU]/h via INTRAVENOUS
  Filled 2015-09-16: qty 250

## 2015-09-16 NOTE — ED Notes (Signed)
Pt called out for burning central chest pain with accompanying symptoms of nausea, SOB, weakness/lightheadedness/dizziness, and back pain. MD Cinda Quest notified

## 2015-09-16 NOTE — Consult Note (Signed)
ANTICOAGULATION CONSULT NOTE - Initial Consult  Pharmacy Consult for heparin drip Indication: chest pain/ACS  Allergies  Allergen Reactions  . No Known Allergies     Patient Measurements: Height: '5\' 8"'$  (172.7 cm) Weight: 138 lb (62.6 kg) IBW/kg (Calculated) : 68.4 Heparin Dosing Weight: 62.6kg  Vital Signs: Temp: 98.3 F (36.8 C) (08/26 1911) Temp Source: Oral (08/26 1911) BP: 139/86 (08/26 1911) Pulse Rate: 104 (08/26 1911)  Labs:  Recent Labs  09/14/15 0937 09/16/15 1917  HGB 12.1* 12.5*  HCT 35.0* 37.3*  PLT 147* 111*  CREATININE 0.81 1.01  TROPONINI  --  0.71*    Estimated Creatinine Clearance: 72.3 mL/min (by C-G formula based on SCr of 1.01 mg/dL).   Medical History: Past Medical History:  Diagnosis Date  . CHF (congestive heart failure) ([place])   . COPD (chronic obstructive pulmonary disease) ([place])   . Hypothyroidism   . Lung cancer ([place]) 2015   RIght   . Seizures ([place])     Medications:  Scheduled:  . heparin  60 Units/kg Intravenous Once    Assessment: Pt is a 55 year old male currently undergoing chemotherapy for lung cancer. Pt presents with SOB and elevated troponin. Care everywhere reveals no home anticoagulants. Baseline CBC, APTT and INR have been ordered.  Goal of Therapy:  Heparin level 0.3-0.7 units/ml Monitor platelets by anticoagulation protocol: Yes   Plan:  Give 3700 units bolus x 1 Start heparin infusion at 750 units/hr Check anti-Xa level in 6 hours and daily while on heparin Continue to monitor H&H and platelets   Donel Osowski D Javonda Suh, Pharm.D Clinical Pharmacist  09/16/2015,8:18 PM

## 2015-09-16 NOTE — H&P (Signed)
[facility] NAME: Shann Merrick    MR#:  [id number]  DATE OF BIRTH:  [birth year]  DATE OF ADMISSION:  09/16/2015  PRIMARY CARE PHYSICIAN: [facility]   REQUESTING/REFERRING PHYSICIAN: Cinda Quest, MD  CHIEF COMPLAINT:   Chief Complaint  Patient presents with  . Shortness of Breath  . Weakness    HISTORY OF PRESENT ILLNESS:  Darryl Hatfield  is a 55 y.o. male who presents with 2 days dyspnea on exertion, as well as lower extremity pain. Patient states mostly that his ankle has been bothering him as he has been working in the yard for these past couple of days. However, he is also noticed dyspnea as he exerts himself in the ER, requiring him to stop for periods of time to rest. He came in tonight to be evaluated for his ankle pain, but also more importantly for his dyspnea which has been mildly progressive, and he is now experiencing this even at rest. He does have a history of congestive heart failure as well as CAD, though it has been nonobstructive in the past with no history of prior MI or cardiac stenting per his report. His last cath was 6-7 years ago.  PAST MEDICAL HISTORY:   Past Medical History:  Diagnosis Date  . CHF (congestive heart failure) ([place])   . COPD (chronic obstructive pulmonary disease) ([place])   . Hypothyroidism   . Lung cancer ([place]) 2015   RIght   . Seizures ([place])     PAST SURGICAL HISTOIRY:   Past Surgical History:  Procedure Laterality Date  . BACK SURGERY    . PORTACATH PLACEMENT Right     SOCIAL HISTORY:   Social History  Substance Use Topics  . Smoking status: Former Smoker    Packs/day: 1.00    Years: 40.00    Quit date: 06/20/2013  . Smokeless tobacco: Never Used  . Alcohol use No    FAMILY HISTORY:   Family History  Problem Relation Age of Onset  . Hypertension Other   . Heart failure Other   . Cancer Father     DRUG ALLERGIES:   Allergies  Allergen Reactions   . No Known Allergies     REVIEW OF SYSTEMS:  Review of Systems  Constitutional: Negative for chills, fever, malaise/fatigue and weight loss.  HENT: Negative for ear pain, hearing loss and tinnitus.   Eyes: Negative for blurred vision, double vision, pain and redness.  Respiratory: Positive for shortness of breath. Negative for cough and hemoptysis.   Cardiovascular: Negative for chest pain, palpitations, orthopnea and leg swelling.  Gastrointestinal: Negative for abdominal pain, constipation, diarrhea, nausea and vomiting.  Genitourinary: Negative for dysuria, frequency and hematuria.  Musculoskeletal: Negative for back pain, joint pain and neck pain.  Skin:       No acne, rash, or lesions  Neurological: Negative for dizziness, tremors, focal weakness and weakness.  Endo/Heme/Allergies: Negative for polydipsia. Does not bruise/bleed easily.  Psychiatric/Behavioral: Negative for depression. The patient is not nervous/anxious and does not have insomnia.     MEDICATIONS AT HOME:   Prior to Admission medications   Medication Sig Start Date End Date Taking? Authorizing Provider  acetaminophen (TYLENOL) 325 MG tablet Take 650 mg by mouth every 6 (six) hours as needed for mild pain.  04/21/14   Historical Provider, MD  albuterol (PROVENTIL) (2.5 MG/3ML) 0.083% nebulizer solution Take 3 mLs (2.5 mg total) by nebulization every 6 (six) hours as  needed for wheezing or shortness of breath. 02/03/15   Dmitriy Berenzon, MD  dexamethasone (DECADRON) 4 MG tablet Take 1 tab two times a day the day before Alimta chemo. Take 2 tabs two times a day starting the day after chemo for 3 days. Patient not taking: Reported on 09/14/2015 08/17/15   Lequita Asal, MD  DULoxetine (CYMBALTA) 30 MG capsule Take 30 mg by mouth daily.    Historical Provider, MD  folic acid (FOLVITE) 1 MG tablet TAKE 1 TABLET BY MOUTH DAILY START 5-7 DAYS BEFORE ALIMTA CHEMO, CONTINUE UNTIL 21 DAYS AFTER ALIMTA 09/13/15   Lequita Asal, MD  gabapentin (NEURONTIN) 100 MG capsule Take 1 capsule (100 mg total) by mouth 3 (three) times daily. 03/15/15   Dmitriy Berenzon, MD  HYDROcodone-acetaminophen (NORCO) 5-325 MG tablet Take 1 tablet by mouth every 6 (six) hours as needed for moderate pain. 09/07/15   Lequita Asal, MD  hydrOXYzine (ATARAX/VISTARIL) 10 MG tablet Take 1 tablet (10 mg total) by mouth 3 (three) times daily as needed for itching. 08/17/15   Lequita Asal, MD  levothyroxine (SYNTHROID, LEVOTHROID) 88 MCG tablet Take 1 tablet (88 mcg total) by mouth daily before breakfast. 06/16/15   Lequita Asal, MD  LORazepam (ATIVAN) 0.5 MG tablet Take 1 tablet (0.5 mg total) by mouth every 8 (eight) hours as needed for anxiety. 06/16/15   Lequita Asal, MD  Multiple Vitamin (MULTI-VITAMINS) TABS Take by mouth.    Historical Provider, MD  ondansetron (ZOFRAN) 8 MG tablet Take 1 tablet (8 mg total) by mouth every 8 (eight) hours as needed for nausea or vomiting. 09/07/15   Lequita Asal, MD  QUEtiapine (SEROQUEL) 50 MG tablet Take by mouth. 07/18/15 01/14/16  Historical Provider, MD  tiotropium (SPIRIVA) 18 MCG inhalation capsule Place 18 mcg into inhaler and inhale daily.    Historical Provider, MD      VITAL SIGNS:   Vitals:   09/16/15 1911 09/16/15 1914  BP: 139/86   Pulse: (!) 104   Resp: (!) 25   Temp: 98.3 F (36.8 C)   TempSrc: Oral   SpO2: 99% 98%  Weight:  62.6 kg (138 lb)  Height:  '5\' 8"'$  (1.727 m)   Wt Readings from Last 3 Encounters:  09/16/15 62.6 kg (138 lb)  09/14/15 62.1 kg (136 lb 12.7 oz)  09/07/15 61.6 kg (135 lb 12.9 oz)    PHYSICAL EXAMINATION:  Physical Exam  Vitals reviewed. Constitutional: He is oriented to person, place, and time. He appears well-developed and well-nourished. No distress.  HENT:  Head: Normocephalic and atraumatic.  Mouth/Throat: Oropharynx is clear and moist.  Eyes: Conjunctivae and EOM are normal. Pupils are equal, round, and reactive to  light. No scleral icterus.  Neck: Normal range of motion. Neck supple. No JVD present. No thyromegaly present.  Cardiovascular: Normal rate, regular rhythm and intact distal pulses.  Exam reveals no gallop and no friction rub.   No murmur heard. Respiratory: Effort normal. No respiratory distress. He has no wheezes. He has rales (fine bibasilar crackles).  GI: Soft. Bowel sounds are normal. He exhibits no distension. There is no tenderness.  Musculoskeletal: Normal range of motion. He exhibits no edema.  No arthritis, no gout  Lymphadenopathy:    He has no cervical adenopathy.  Neurological: He is alert and oriented to person, place, and time. No cranial nerve deficit.  No dysarthria, no aphasia  Skin: Skin is warm and dry. No rash noted. No  erythema.  Psychiatric: He has a normal mood and affect. His behavior is normal. Judgment and thought content normal.     LABORATORY PANEL:   CBC  Recent Labs Lab 09/16/15 1917  WBC 12.4*  HGB 12.5*  HCT 37.3*  PLT 111*   ------------------------------------------------------------------------------------------------------------------  Chemistries   Recent Labs Lab 09/14/15 0937 09/16/15 1917  NA 136 138  K 3.7 4.1  CL 102 99*  CO2 27 27  GLUCOSE 104* 222*  BUN 18 31*  CREATININE 0.81 1.01  CALCIUM 9.0 8.9  MG 1.8  --   AST 25 33  ALT 35 37  ALKPHOS 82 93  BILITOT 0.4 <0.1*   ------------------------------------------------------------------------------------------------------------------  Cardiac Enzymes  Recent Labs Lab 09/16/15 1917  TROPONINI 0.71*   ------------------------------------------------------------------------------------------------------------------  RADIOLOGY:  Dg Chest Port 1 View  Result Date: 09/16/2015 CLINICAL DATA:  Shortness of breath and weakness. EXAM: PORTABLE CHEST 1 VIEW COMPARISON:  PET-CT 09/06/2015, chest CT 08/09/2015 FINDINGS: Injectable port terminates within the superior vena  cava. Cardiomediastinal silhouette is normal. There is a persistent right suprahilar peribronchial linear opacities, thought to represent post radiation changes/residual tumor in the right upper lobe, with associated volume loss. There is no evidence of focal airspace consolidation, pleural effusion or pneumothorax. Osseous structures are without acute abnormality. Partially seen spinal fusion. Soft tissues are grossly normal. IMPRESSION: Persistent right suprahilar peribronchial and in opacities, thought to represent post radiation changes/residual tumor, with volume loss in the right upper lobe. No additional acute focal airspace consolidation. Electronically Signed   By: Fidela Salisbury M.D.   On: 09/16/2015 20:45    EKG:   Orders placed or performed during the hospital encounter of 09/16/15  . ED EKG  . ED EKG  . ED EKG  . ED EKG  . ED EKG  . ED EKG  . ED EKG  . ED EKG    IMPRESSION AND PLAN:  Principal Problem:   STEMI (ST elevation myocardial infarction) ([place]) - Patient has inferior lead ST elevations, and an elevated troponin in the setting of 2 days of dyspnea on exertion. He has a known history of CAD, though no prior MI or stenting. He has significant history of cancer with multiple regimens of chemotherapeutic agents, he does not remember them all and is unclear if she's ever had any cardiotoxic therapeutic agents. I discussed his case with Dr. Ubaldo Glassing, who agrees that because of his persistent symptoms patient would be better served by transferring to be evaluated for potential urgent catheterization. Recommended the same to ED physician, Dr. Cinda Quest. Active Problems:   Cancer of upper lobe of right lung ([place]) - still undergoing chemotherapeutic treatment, though he is unclear what agents he is received in the past. When asked specifically about doxorubicin daunorubicin, he denies having received these. He does think that at some point he may received taxanes chemotherapeutic agent.  Other than that he is unaware of what other agents he is received. He does have a history of congestive heart failure and CAD.  All the records are reviewed and case discussed with ED provider. Management plans discussed with the patient and/or family.  CODE STATUS: Full Code Status History    Date Active Date Inactive Code Status Order ID Comments User Context   12/25/2014  4:39 AM 12/26/2014  6:04 PM Full Code [id number]  Harrie Foreman, MD Inpatient    Advance Directive Documentation   Flowsheet Row Most Recent Value  Type of Advance Directive  Healthcare Power of [place]  Pre-existing out of facility DNR order (yellow form or pink MOST form)  No data  "MOST" Form in Place?  No data      TOTAL TIME TAKING CARE OF THIS PATIENT: 40 minutes.    Thomas Rhude [place] 09/16/2015, 9:37 PM  [facility]  Office  [phone]  CC: Primary care Physician: [facility]

## 2015-09-16 NOTE — ED Notes (Signed)
Removed and redressed pt's bandages on upper arms. Pt had small abrasions to lateral upper left arm. Pt had 1" skin tear to lateral/posterior right arm

## 2015-09-16 NOTE — ED Notes (Signed)
Pt called out to report blood leakage and pain at IV site. Heparin stopped.  Pt has small amount of leakage around IV, and slight edema to IV site. MD [place] notified. MD Cinda Quest came to bedside and ordered to D/C IV, and insert new IV for heparin therapy.   IV was removed, new IV started, heparin restarted.

## 2015-09-16 NOTE — ED Triage Notes (Signed)
Per EMS: Pt c/o SOB, weakness, and bilateral ankle edema. Pt has hx of lung cancer, COPD, and CHF. Pt currently on chemo.

## 2015-09-16 NOTE — ED Provider Notes (Addendum)
[facility] Emergency Department Provider Note   ____________________________________________   First MD Initiated Contact with Patient 09/16/15 1913     (approximate)  I have reviewed the triage vital signs and the nursing notes.   HISTORY  Chief Complaint Shortness of Breath and Weakness    HPI Darryl Hatfield is a 55 y.o. male who is receiving chemotherapy for lung cancer. His last chemotherapy was this past Thursday. He also stated history of COPD and CHF. He is been getting more short of breath than usual for the last day or 2. In today's blood pressure heart rate went up. He also complains of increasing low back pain and pain in the right foot and ankle. He did not injure the right foot and ankle at all. Palpation of the right foot and ankle does not increase his pain. Patient complains of swelling in his ankles but at the present time he has minimal to trace edema in both legs. Patient has no chest pain or tightness. Shortness of breath is worse with exertion.   Past Medical History:  Diagnosis Date  . CHF (congestive heart failure) ([place])   . COPD (chronic obstructive pulmonary disease) ([place])   . Hypothyroidism   . Lung cancer ([place]) 2015   RIght   . Seizures [facility])     Patient Active Problem List   Diagnosis Date Noted  . Epigastric discomfort 09/16/2015  . Pruritus 07/27/2015  . Constipation 06/05/2015  . Anxiety 05/12/2015  . Weight loss 05/12/2015  . 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 Adenocarcinoma of lung, stage 4 ([place]) 04/25/2015  . Iron deficiency anemia 04/05/2015  . Episodes of formed visual hallucinations 12/26/2014  . Dementia with behavioral disturbance 12/26/2014  . Delirium 12/25/2014  . Cancer of upper lobe of right lung ([place]) 07/02/2013  . SCIATICA 03/01/2010    Past Surgical History:  Procedure Laterality Date  . BACK SURGERY    . PORTACATH PLACEMENT Right     Prior to Admission medications    Medication Sig Start Date End Date Taking? Authorizing Provider  acetaminophen (TYLENOL) 325 MG tablet Take by mouth. 04/21/14   Historical Provider, MD  albuterol (PROVENTIL) (2.5 MG/3ML) 0.083% nebulizer solution Take 3 mLs (2.5 mg total) by nebulization every 6 (six) hours as needed for wheezing or shortness of breath. 02/03/15   Dmitriy Berenzon, MD  dexamethasone (DECADRON) 4 MG tablet Take 1 tab two times a day the day before Alimta chemo. Take 2 tabs two times a day starting the day after chemo for 3 days. Patient not taking: Reported on 09/14/2015 08/17/15   Lequita Asal, MD  DULoxetine (CYMBALTA) 30 MG capsule Take 30 mg by mouth daily.    Historical Provider, MD  Fluticasone-Salmeterol (ADVAIR DISKUS) 250-50 MCG/DOSE AEPB Inhale into the lungs. 06/27/15   Historical Provider, MD  folic acid (FOLVITE) 1 MG tablet TAKE 1 TABLET BY MOUTH DAILY START 5-7 DAYS BEFORE ALIMTA CHEMO, CONTINUE UNTIL 21 DAYS AFTER ALIMTA 09/13/15   Lequita Asal, MD  gabapentin (NEURONTIN) 100 MG capsule Take 1 capsule (100 mg total) by mouth 3 (three) times daily. 03/15/15   Dmitriy Berenzon, MD  HYDROcodone-acetaminophen (NORCO) 5-325 MG tablet Take 1 tablet by mouth every 6 (six) hours as needed for moderate pain. 09/07/15   Lequita Asal, MD  hydrOXYzine (ATARAX/VISTARIL) 10 MG tablet Take 1 tablet (10 mg total) by mouth 3 (three) times daily as needed for itching. 08/17/15   Lequita Asal, MD  levothyroxine (SYNTHROID, LEVOTHROID) 88 MCG tablet Take 1 tablet (88 mcg total) by mouth daily before breakfast. 06/16/15   Lequita Asal, MD  LORazepam (ATIVAN) 0.5 MG tablet Take 1 tablet (0.5 mg total) by mouth every 8 (eight) hours as needed for anxiety. 06/16/15   Lequita Asal, MD  Multiple Vitamin (MULTI-VITAMINS) TABS Take by mouth.    Historical Provider, MD  ondansetron (ZOFRAN) 8 MG tablet Take 1 tablet (8 mg total) by mouth every 8 (eight) hours as needed for nausea or vomiting. 09/07/15    Lequita Asal, MD  QUEtiapine (SEROQUEL) 50 MG tablet Take by mouth. 07/18/15 01/14/16  Historical Provider, MD  tiotropium (SPIRIVA) 18 MCG inhalation capsule Place 18 mcg into inhaler and inhale daily.    Historical Provider, MD    Allergies No known allergies  Family History  Problem Relation Age of Onset  . Hypertension Other   . Heart failure Other   . Cancer Father     Social History Social History  Substance Use Topics  . Smoking status: Former Smoker    Packs/day: 1.00    Years: 40.00    Quit date: 06/20/2013  . Smokeless tobacco: Never Used  . Alcohol use No    Review of Systems Constitutional: No fever/chills Eyes: No visual changes. ENT: No sore throat. Cardiovascular: Denies chest pain. Respiratory:  shortness of breath. Gastrointestinal: No abdominal pain.  No nausea, no vomiting.  No diarrhea.  No constipation. Genitourinary: Negative for dysuria. MusculoskeletalHe history of present illness Skin: Negative for rash. Neurological: Negative for headaches, focal weakness or numbness.  10-point ROS otherwise negative.  ____________________________________________   PHYSICAL EXAM:  VITAL SIGNS: ED Triage Vitals  Enc Vitals Group     BP 09/16/15 1911 139/86     Pulse Rate 09/16/15 1911 (!) 104     Resp 09/16/15 1911 (!) 25     Temp 09/16/15 1911 98.3 F (36.8 C)     Temp Source 09/16/15 1911 Oral     SpO2 09/16/15 1911 99 %     Weight 09/16/15 1914 138 lb (62.6 kg)     Height 09/16/15 1914 '5\' 8"'$  (1.727 m)     Head Circumference --      Peak Flow --      Pain Score 09/16/15 1914 8     Pain Loc --      Pain Edu? --      Excl. in [place]? --    titutional: Alert and oriented. Well appearing and in no acute distress. Eyes: Conjunctivae are normal. PERRL. EOMI. Head: Atraumatic. Nose: No congestion/rhinnorhea. Mouth/Throat: Mucous membranes are moist.  Oropharynx non-erythematous. Neck: No stridor.  Cardiovascular: Normal rate, regular rhythm.  Grossly normal heart sounds.  Good peripheral circulation. Respiratory: Normal respiratory effort.  No retractions. Lungs CTAB. Gastrointestinal: Soft and nontender. No distention. No abdominal bruits. No CVA tenderness. Skin:  Skin is warm, dry and intact. No rash noted. Psychiatric: Mood and affect are normal. Speech and behavior are normal. Legs: Trace edema. No increase in ankle pain with palpation   LABS (all labs ordered are listed, but only abnormal results are displayed)  Labs Reviewed  COMPREHENSIVE METABOLIC PANEL - Abnormal; Notable for the following:       Result Value   Chloride 99 (*)    Glucose, Bld 222 (*)    BUN 31 (*)    Total Bilirubin <0.1 (*)    All other components within normal limits  CBC WITH DIFFERENTIAL/PLATELET - Abnormal;  Notable for the following:    WBC 12.4 (*)    RBC 3.95 (*)    Hemoglobin 12.5 (*)    HCT 37.3 (*)    RDW 17.3 (*)    Platelets 111 (*)    Neutro Abs 10.8 (*)    Lymphs Abs 0.9 (*)    All other components within normal limits  TROPONIN I - Abnormal; Notable for the following:    Troponin I 0.71 (*)    All other components within normal limits  PROTIME-INR - Abnormal; Notable for the following:    Prothrombin Time 17.6 (*)    All other components within normal limits  BRAIN NATRIURETIC PEPTIDE  APTT  URINALYSIS COMPLETEWITH MICROSCOPIC ([facility] ONLY)  HEPARIN LEVEL (UNFRACTIONATED)  CBG MONITORING, ED   ____________________________________________  EKG  EKG #1 read and interpreted by me shows sinus tachycardia rate of 108 normal axis no acute changes computer is reading ST elevation inferiorly and I do not see that EKG #2 read and interpreted by me sinus tachycardia rate 101 normal axis computer is again reading ST elevation consider inferior injury there may be a small amount of ST elevation on this EKG was difficult to tell and again when I compared to an old EKG from June and looks very similar including the ST elevation  inferiorly. ____________________________________________  RADIOLOGY  Pending ____________________________________________   PROCEDURES  Procedure(s) performed EKGs of patient reviewed with Dr. Ubaldo Glassing. He agrees that there is no sign of STEMI  Procedures  Critical Care performed:    ____________________________________________   INITIAL IMPRESSION / ASSESSMENT AND PLAN / ED COURSE  Pertinent labs & imaging results that were available during my care of the patient were reviewed by me and considered in my medical decision making (see chart for details).   Clinical Course    Patient's chest pain recurred in the ER EKG was repeated that was EKG #2 troponin returned elevated we will admit the patient   Hospitalist looks his EKGs patient is still having chest pain and some shortness of breath he is worried about the EKGs to want to send patient to the call [facility] and have sent the EKGs [facility] as well were waiting for them to call his back. ____________________________________________   FINAL CLINICAL IMPRESSION(S) / ED DIAGNOSES  Final diagnoses:  Chest pain  Chest pain, unspecified chest pain type  Elevated troponin      NEW MEDICATIONS STARTED DURING THIS VISIT:  New Prescriptions   No medications on file     Note:  This document was prepared using Dragon voice recognition software and may include unintentional dictation errors.    Nena Polio, MD 09/16/15 2041  CCU fellow calls back feels patient is not having a STEMI The patient is a [facility] patient however he says. He understands we're worried about him with his chest pain. He asked that we give him some Plavix and try to see what nitroglycerin does to him. I'm somewhat worried about giving somebody with inferior EKG changes  however slight nitroglycerin but we will try that with some IV fluids immediately available. Due fellow asked [place] to call back after 20 minutes of nitroglycerin. Go through the transfer center  and he will probably accept him. Nena Polio, MD 09/16/15 2207    Nena Polio, MD 09/16/15 2221 Patient is now pain-free blood pressure stable we'll call [facility] back.   Nena Polio, MD 09/16/15 2239 Apparently I misunderstood the patient is now he says the pain  is still a 5 and the nitroglycerin did not help.   Nena Polio, MD 09/16/15 2243 [facility] Will accept x-rays of the foot and ankle are negative CLINICAL DATA:  Shortness of breath and weakness.  EXAM: PORTABLE CHEST 1 VIEW  COMPARISON:  PET-CT 09/06/2015, chest CT 08/09/2015  FINDINGS: Injectable port terminates within the superior vena cava.  Cardiomediastinal silhouette is normal. There is a persistent right suprahilar peribronchial linear opacities, thought to represent post radiation changes/residual tumor in the right upper lobe, with associated volume loss.  There is no evidence of focal airspace consolidation, pleural effusion or pneumothorax.  Osseous structures are without acute abnormality. Partially seen spinal fusion. Soft tissues are grossly normal.  IMPRESSION: Persistent right suprahilar peribronchial and in opacities, thought to represent post radiation changes/residual tumor, with volume loss in the right upper lobe.  No additional acute focal airspace consolidation.   Electronically Signed   By: Fidela Salisbury M.D.   On: 09/16/2015 20:45     Nena Polio, MD 09/16/15 2252 Repeat EKG at 2310 hrs. looks better than the last EKG especially in view of the inferior leads. Awaiting [facility]'s call back for bed.   Nena Polio, MD 09/16/15 [phone]

## 2015-09-16 NOTE — ED Notes (Signed)
Pt has multiple cuts on bilateral upper arms that he obtained while cutting tree branches yesterday.   Pt c/o SOB, and weakness that began today while mowing the lawn.   Pt currently receiving chemo treatments for lung cancer - last treatment on Thursday.

## 2015-09-16 NOTE — ED Notes (Signed)
MD Malinda at bedside. 

## 2015-09-17 LAB — TROPONIN I: Troponin I: 0.8 ng/mL (ref <0.03)

## 2015-09-17 LAB — HEPARIN LEVEL (UNFRACTIONATED): Heparin Unfractionated: 0.82 IU/mL — ABNORMAL HIGH (ref 0.30–0.70)

## 2015-09-17 MED ORDER — LORAZEPAM 2 MG/ML IJ SOLN
0.5000 mg | Freq: Once | INTRAMUSCULAR | Status: AC
  Administered 2015-09-17: 0.5 mg via INTRAVENOUS

## 2015-09-17 MED ORDER — MORPHINE SULFATE (PF) 4 MG/ML IV SOLN
4.0000 mg | Freq: Once | INTRAVENOUS | Status: AC
  Administered 2015-09-17: 4 mg via INTRAVENOUS

## 2015-09-17 MED ORDER — ONDANSETRON HCL 4 MG/2ML IJ SOLN
4.0000 mg | Freq: Once | INTRAMUSCULAR | Status: AC
  Administered 2015-09-17: 4 mg via INTRAVENOUS
  Filled 2015-09-17: qty 2

## 2015-09-17 MED ORDER — MORPHINE SULFATE (PF) 4 MG/ML IV SOLN
INTRAVENOUS | Status: AC
  Filled 2015-09-17: qty 1

## 2015-09-17 MED ORDER — LORAZEPAM 2 MG/ML IJ SOLN
INTRAMUSCULAR | Status: AC
  Filled 2015-09-17: qty 1

## 2015-09-17 NOTE — ED Notes (Signed)
Called [facility] RN to update her on pt status

## 2015-09-17 NOTE — ED Notes (Signed)
Gave report to [employer] RN

## 2015-09-17 NOTE — Consult Note (Signed)
ANTICOAGULATION CONSULT NOTE - Initial Consult  Pharmacy Consult for heparin drip Indication: chest pain/ACS  Allergies  Allergen Reactions  . No Known Allergies     Patient Measurements: Height: '5\' 8"'$  (172.7 cm) Weight: 138 lb (62.6 kg) IBW/kg (Calculated) : 68.4 Heparin Dosing Weight: 62.6kg  Vital Signs: Temp: 98.3 F (36.8 C) (08/26 1911) Temp Source: Oral (08/26 1911) BP: 130/76 (08/27 0300) Pulse Rate: 75 (08/27 0300)  Labs:  Recent Labs  09/14/15 0937 09/16/15 1917 09/17/15 0218  HGB 12.1* 12.5*  --   HCT 35.0* 37.3*  --   PLT 147* 111*  --   APTT  --  29  --   LABPROT  --  17.6*  --   INR  --  1.43  --   HEPARINUNFRC  --   --  0.82*  CREATININE 0.81 1.01  --   TROPONINI  --  0.71* 0.80*    Estimated Creatinine Clearance: 72.3 mL/min (by C-G formula based on SCr of 1.01 mg/dL).   Medical History: Past Medical History:  Diagnosis Date  . CHF (congestive heart failure) ([place])   . COPD (chronic obstructive pulmonary disease) ([place])   . Hypothyroidism   . Lung cancer ([place]) 2015   RIght   . Seizures ([place])     Medications:  Scheduled:     Assessment: Pt is a 55 year old male currently undergoing chemotherapy for lung cancer. Pt presents with SOB and elevated troponin. Care everywhere reveals no home anticoagulants. Baseline CBC, APTT and INR have been ordered.  Goal of Therapy:  Heparin level 0.3-0.7 units/ml Monitor platelets by anticoagulation protocol: Yes   Plan:  Give 3700 units bolus x 1 Start heparin infusion at 750 units/hr Check anti-Xa level in 6 hours and daily while on heparin Continue to monitor H&H and platelets   8/27 02:00 heparin level 82. Reduce rate to 700 units/hr. Patient is to transfer out so no f/u level ordered.  Ramond Dial, Pharm.D Clinical Pharmacist  09/17/2015,3:14 AM

## 2015-09-19 ENCOUNTER — Telehealth: Payer: Self-pay | Admitting: *Deleted

## 2015-09-19 NOTE — Telephone Encounter (Signed)
Called to report that he is in the hospital at [facility] room 7319, he had an MI. He is also having bloody diarrhea. He was supposed to go home today until he started passing blood in stools. He is scheduled for a lab/ md/ Nivo. If you want to call to check on him the password is Princess.

## 2015-09-26 ENCOUNTER — Telehealth: Payer: Self-pay | Admitting: *Deleted

## 2015-09-26 NOTE — Telephone Encounter (Signed)
Called to report that he is being discharged from [facility] today and has an appt for DM chemo Thursday here. He will come to that appt, but is not going to take "that drug" any more

## 2015-09-28 ENCOUNTER — Other Ambulatory Visit: Payer: BLUE CROSS/BLUE SHIELD

## 2015-09-28 ENCOUNTER — Ambulatory Visit: Payer: BLUE CROSS/BLUE SHIELD | Admitting: Hematology and Oncology

## 2015-09-28 ENCOUNTER — Inpatient Hospital Stay: Payer: BLUE CROSS/BLUE SHIELD | Attending: Hematology and Oncology

## 2015-09-28 ENCOUNTER — Other Ambulatory Visit: Payer: Self-pay | Admitting: *Deleted

## 2015-09-28 ENCOUNTER — Inpatient Hospital Stay: Payer: BLUE CROSS/BLUE SHIELD

## 2015-09-28 ENCOUNTER — Inpatient Hospital Stay (HOSPITAL_BASED_OUTPATIENT_CLINIC_OR_DEPARTMENT_OTHER): Payer: BLUE CROSS/BLUE SHIELD | Admitting: Hematology and Oncology

## 2015-09-28 ENCOUNTER — Telehealth: Payer: Self-pay | Admitting: *Deleted

## 2015-09-28 ENCOUNTER — Ambulatory Visit: Payer: BLUE CROSS/BLUE SHIELD

## 2015-09-28 VITALS — BP 124/79 | HR 97 | Temp 95.0°F | Resp 18 | Wt 141.5 lb

## 2015-09-28 VITALS — BP 110/68 | HR 108

## 2015-09-28 DIAGNOSIS — C7972 Secondary malignant neoplasm of left adrenal gland: Secondary | ICD-10-CM | POA: Diagnosis not present

## 2015-09-28 DIAGNOSIS — E039 Hypothyroidism, unspecified: Secondary | ICD-10-CM | POA: Diagnosis not present

## 2015-09-28 DIAGNOSIS — I214 Non-ST elevation (NSTEMI) myocardial infarction: Secondary | ICD-10-CM | POA: Insufficient documentation

## 2015-09-28 DIAGNOSIS — K529 Noninfective gastroenteritis and colitis, unspecified: Secondary | ICD-10-CM | POA: Diagnosis not present

## 2015-09-28 DIAGNOSIS — C3490 Malignant neoplasm of unspecified part of unspecified bronchus or lung: Secondary | ICD-10-CM

## 2015-09-28 DIAGNOSIS — I2699 Other pulmonary embolism without acute cor pulmonale: Secondary | ICD-10-CM | POA: Diagnosis not present

## 2015-09-28 DIAGNOSIS — I82413 Acute embolism and thrombosis of femoral vein, bilateral: Secondary | ICD-10-CM | POA: Diagnosis not present

## 2015-09-28 DIAGNOSIS — I951 Orthostatic hypotension: Secondary | ICD-10-CM

## 2015-09-28 DIAGNOSIS — Z79899 Other long term (current) drug therapy: Secondary | ICD-10-CM | POA: Insufficient documentation

## 2015-09-28 DIAGNOSIS — Z87891 Personal history of nicotine dependence: Secondary | ICD-10-CM | POA: Insufficient documentation

## 2015-09-28 DIAGNOSIS — R569 Unspecified convulsions: Secondary | ICD-10-CM | POA: Diagnosis not present

## 2015-09-28 DIAGNOSIS — C3411 Malignant neoplasm of upper lobe, right bronchus or lung: Secondary | ICD-10-CM | POA: Insufficient documentation

## 2015-09-28 DIAGNOSIS — Z7901 Long term (current) use of anticoagulants: Secondary | ICD-10-CM | POA: Diagnosis not present

## 2015-09-28 DIAGNOSIS — E860 Dehydration: Secondary | ICD-10-CM

## 2015-09-28 DIAGNOSIS — R5383 Other fatigue: Secondary | ICD-10-CM | POA: Insufficient documentation

## 2015-09-28 DIAGNOSIS — C778 Secondary and unspecified malignant neoplasm of lymph nodes of multiple regions: Secondary | ICD-10-CM

## 2015-09-28 DIAGNOSIS — J449 Chronic obstructive pulmonary disease, unspecified: Secondary | ICD-10-CM | POA: Insufficient documentation

## 2015-09-28 DIAGNOSIS — C7971 Secondary malignant neoplasm of right adrenal gland: Secondary | ICD-10-CM | POA: Diagnosis not present

## 2015-09-28 DIAGNOSIS — C3491 Malignant neoplasm of unspecified part of right bronchus or lung: Secondary | ICD-10-CM

## 2015-09-28 DIAGNOSIS — E569 Vitamin deficiency, unspecified: Secondary | ICD-10-CM | POA: Insufficient documentation

## 2015-09-28 DIAGNOSIS — G8929 Other chronic pain: Secondary | ICD-10-CM

## 2015-09-28 DIAGNOSIS — C7800 Secondary malignant neoplasm of unspecified lung: Secondary | ICD-10-CM | POA: Diagnosis not present

## 2015-09-28 DIAGNOSIS — I509 Heart failure, unspecified: Secondary | ICD-10-CM | POA: Insufficient documentation

## 2015-09-28 DIAGNOSIS — B029 Zoster without complications: Secondary | ICD-10-CM | POA: Diagnosis not present

## 2015-09-28 DIAGNOSIS — D509 Iron deficiency anemia, unspecified: Secondary | ICD-10-CM | POA: Diagnosis not present

## 2015-09-28 DIAGNOSIS — C7970 Secondary malignant neoplasm of unspecified adrenal gland: Secondary | ICD-10-CM

## 2015-09-28 LAB — CBC WITH DIFFERENTIAL/PLATELET
Basophils Absolute: 0 10*3/uL (ref 0–0.1)
Basophils Relative: 0 %
Eosinophils Absolute: 0.1 10*3/uL (ref 0–0.7)
Eosinophils Relative: 1 %
HCT: 31.7 % — ABNORMAL LOW (ref 40.0–52.0)
Hemoglobin: 10.9 g/dL — ABNORMAL LOW (ref 13.0–18.0)
Lymphocytes Relative: 16 %
Lymphs Abs: 1.6 10*3/uL (ref 1.0–3.6)
MCH: 31.9 pg (ref 26.0–34.0)
MCHC: 34.3 g/dL (ref 32.0–36.0)
MCV: 92.9 fL (ref 80.0–100.0)
Monocytes Absolute: 1 10*3/uL (ref 0.2–1.0)
Monocytes Relative: 10 %
Neutro Abs: 7.7 10*3/uL — ABNORMAL HIGH (ref 1.4–6.5)
Neutrophils Relative %: 73 %
Platelets: 180 10*3/uL (ref 150–440)
RBC: 3.41 MIL/uL — ABNORMAL LOW (ref 4.40–5.90)
RDW: 15.5 % — ABNORMAL HIGH (ref 11.5–14.5)
WBC: 10.5 10*3/uL (ref 3.8–10.6)

## 2015-09-28 LAB — COMPREHENSIVE METABOLIC PANEL
ALT: 66 U/L — ABNORMAL HIGH (ref 17–63)
AST: 33 U/L (ref 15–41)
Albumin: 3.6 g/dL (ref 3.5–5.0)
Alkaline Phosphatase: 97 U/L (ref 38–126)
Anion gap: 7 (ref 5–15)
BUN: 19 mg/dL (ref 6–20)
CO2: 29 mmol/L (ref 22–32)
Calcium: 8.9 mg/dL (ref 8.9–10.3)
Chloride: 99 mmol/L — ABNORMAL LOW (ref 101–111)
Creatinine, Ser: 0.84 mg/dL (ref 0.61–1.24)
GFR calc Af Amer: >60 mL/min (ref >60)
GFR calc non Af Amer: >60 mL/min (ref >60)
Glucose, Bld: 115 mg/dL — ABNORMAL HIGH (ref 65–99)
Potassium: 3.4 mmol/L — ABNORMAL LOW (ref 3.5–5.1)
Sodium: 135 mmol/L (ref 135–145)
Total Bilirubin: 0.5 mg/dL (ref 0.3–1.2)
Total Protein: 6.3 g/dL — ABNORMAL LOW (ref 6.5–8.1)

## 2015-09-28 LAB — MAGNESIUM: Magnesium: 1.8 mg/dL (ref 1.7–2.4)

## 2015-09-28 MED ORDER — SODIUM CHLORIDE 0.9% FLUSH
10.0000 mL | Freq: Once | INTRAVENOUS | Status: AC
  Administered 2015-09-28: 10 mL via INTRAVENOUS
  Filled 2015-09-28: qty 10

## 2015-09-28 MED ORDER — HEPARIN SOD (PORK) LOCK FLUSH 100 UNIT/ML IV SOLN
500.0000 [IU] | Freq: Once | INTRAVENOUS | Status: AC
  Administered 2015-09-28: 500 [IU] via INTRAVENOUS

## 2015-09-28 MED ORDER — ENOXAPARIN SODIUM 60 MG/0.6ML ~~LOC~~ SOLN: 60.0000 mg | Freq: Two times a day (BID) | SUBCUTANEOUS | 0 refills | Status: DC

## 2015-09-28 MED ORDER — SODIUM CHLORIDE 0.9 % IV SOLN
Freq: Once | INTRAVENOUS | Status: AC
  Administered 2015-09-28: 12:00:00 via INTRAVENOUS
  Filled 2015-09-28: qty 1000

## 2015-09-28 NOTE — Progress Notes (Signed)
[place] [facility] day:  09/28/15    Chief Complaint: Darryl Hatfield is a 55 y.o. 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  HPI:  The patient was last seen in the medical oncology clinic on 09/14/2015.  At that time, he received cycle #1 nivolumab.  He tolerated his infusion well.  He presented to [facility] on 09/16/2015 with chest pain and shortness of breath. Troponins were elevated.  He was transferred to [facility] for an NSTEMI.  Cardiac catheterization on 09/18/2015 revealed moderate, nonobstructive CAD in the right dominant system with no clear culprits for his NSTEMI. There was 50% mid LAD and mid RCA lesion.  Given his advanced metastatic cancer and poor long-term prognosis, decision was made for medical treatment alone.  Post cardiac catheterization, he developed a GI bleed.  Abdominal and pelvic CT scan on 09/19/2015 revealed pancolitis with a filling defect within the right lower lobe subsegmental pulmonary artery c/w pulmonary embolism, splenic infarct and possible left renal infarct. There were bilateral adrenal masses and adenopathy consistent with his malignancy. Chest CT angiogram on 09/21/2015 confirmed bilateral pulmonary emboli. Bilateral lower extremity duplex on 09/22/2015 revealed bilateral femoral DVTs.  An IVC filter was placed by interventional radiology. He was on a heparin drip and converted to Lovenox 60 mg every 12 hours.  He was started on steroids post GI bleed. Stool studies were negative for C. difficile. He underwent flexible sigmoidoscopy on 09/21/2015 which showed patchy nonspecific elevation sparing the rectum, worse in the sigmoid colon and up to the transverse colon.  His symptoms rapidly resolved with institution of Solumedrol. He has a colitis was felt secondary to nivolumab.  He  was switched from Solumedrol to prednisone 60 mg daily with plan for taper.  Biopsies were negative for CMV and HSV.  The patient had thyroid function studies performed were hospitalized. TSH was 0.03 and free T4 of 1.49.  His Synthroid was discontinued.  Endocrinology was consulted.  Thyroid ultrasound on 09/19/2015 was normal. Endocrinology rechecked his labs prior to discharge. TSH was normal at 1.09 and free T4 0.88.  Levothyroxine was restarted at 50 g a day with plan for recheck in 2 weeks and titration per endocrinology.  He was discharged from [facility] on 09/26/2015.  Symptomatically, he has felt bad post discharge from the hospital. He notes no energy. He states that right side of his neck is a little sore. He is able to perform his activities of daily living. He has not spent much time in bed.  He is on prednisone 60 mg a day.   Bowel movements are soft and formed. He denies any melena or hematochezia.   Past Medical History:  Diagnosis Date  . CHF (congestive heart failure) ([place])   . COPD (chronic obstructive pulmonary disease) ([place])   . Hypothyroidism   . Lung cancer ([place]) 2015   RIght   . Seizures ([place])     Past Surgical History:  Procedure Laterality Date  . BACK SURGERY    . PORTACATH PLACEMENT Right     Family History  Problem Relation Age of Onset  . Hypertension Other   . Heart failure Other   . Cancer Father     Social History:  reports that he quit smoking about 2 years ago. He has a 40.00 pack-year smoking history. He has never  used smokeless tobacco. He reports that he does not drink alcohol or use drugs.  He is on disability.  He is accompanied by his wife, Darryl Hatfield.  Allergies:  Allergies  Allergen Reactions  . No Known Allergies     Current Medications: Current Outpatient Prescriptions  Medication Sig Dispense Refill  . acetaminophen (TYLENOL) 325 MG tablet Take 650 mg by mouth every 6 (six) hours as needed for mild pain.     Marland Kitchen albuterol (PROVENTIL) (2.5  MG/3ML) 0.083% nebulizer solution Take 3 mLs (2.5 mg total) by nebulization every 6 (six) hours as needed for wheezing or shortness of breath. 75 mL 2  . aspirin 81 MG EC tablet   0  . atorvastatin (LIPITOR) 80 MG tablet Take by mouth.    . Calcium Carbonate-Vitamin D 600-400 MG-UNIT tablet Take by mouth.    . dexamethasone (DECADRON) 4 MG tablet TAKE 1 TAB 2 TIMES A DAY BEFORE CHEMO. TAKE 2 TABS TWO TIMES A DAY STARTING THE DAY AFTER CHEMO  1  . DULoxetine (CYMBALTA) 30 MG capsule Take 30 mg by mouth daily.    Marland Kitchen enoxaparin (LOVENOX) 60 MG/0.6ML injection Inject into the skin.    . folic acid (FOLVITE) 1 MG tablet TAKE 1 TABLET BY MOUTH DAILY START 5-7 DAYS BEFORE ALIMTA CHEMO, CONTINUE UNTIL 21 DAYS AFTER ALIMTA 100 tablet 3  . gabapentin (NEURONTIN) 100 MG capsule Take 1 capsule (100 mg total) by mouth 3 (three) times daily. 90 capsule 4  . levothyroxine (SYNTHROID, LEVOTHROID) 88 MCG tablet Take 1 tablet (88 mcg total) by mouth daily before breakfast. 30 tablet 0  . LORazepam (ATIVAN) 0.5 MG tablet Take 1 tablet (0.5 mg total) by mouth every 8 (eight) hours as needed for anxiety. 30 tablet 0  . metoprolol succinate (TOPROL-XL) 25 MG 24 hr tablet   2  . nitroGLYCERIN (NITROSTAT) 0.4 MG SL tablet   0  . ondansetron (ZOFRAN) 8 MG tablet Take 1 tablet (8 mg total) by mouth every 8 (eight) hours as needed for nausea or vomiting. 30 tablet 3  . pantoprazole (PROTONIX) 40 MG tablet Take by mouth.    . predniSONE (DELTASONE) 20 MG tablet   0  . QUEtiapine (SEROQUEL) 50 MG tablet Take 50 mg by mouth daily.     . simethicone (MYLICON) 80 MG chewable tablet Chew by mouth.    . tiotropium (SPIRIVA) 18 MCG inhalation capsule Place 18 mcg into inhaler and inhale daily.    . hydrOXYzine (ATARAX/VISTARIL) 10 MG tablet Take 1 tablet (10 mg total) by mouth 3 (three) times daily as needed for itching. (Patient not taking: Reported on 09/28/2015) 60 tablet 1   No current facility-administered medications for this  visit.    Facility-Administered Medications Ordered in Other Visits  Medication Dose Route Frequency Provider Last Rate Last Dose  . heparin lock flush 100 unit/mL  500 Units Intravenous Once Lequita Asal, MD      . sodium chloride 0.9 % injection 10 mL  10 mL Intracatheter PRN Forest Gleason, MD   10 mL at 06/27/14 1338  . sodium chloride 0.9 % injection 10 mL  10 mL Intracatheter PRN Leia Alf, MD   10 mL at 08/22/14 0851  . sodium chloride flush (NS) 0.9 % injection 10 mL  10 mL Intravenous PRN Lequita Asal, MD        Review of Systems:  GENERAL:  Fatigue.  No fevers or sweats.  Weight up 5 pounds.  PERFORMANCE STATUS (ECOG):  2 HEENT:  No visual changes, runny nose, sore throat, mouth sores or tenderness. Lungs:  No shortness of breath or cough.  No hemoptysis. Cardiac:  No chest pain, palpitations, orthopnea, or PND. GI:  Stools soft and formed.  Nausea.  No vomiting, diarrhea, melena or hematochezia.  GU:  No urgency, frequency, dysuria, or hematuria. Musculoskeletal:  Chronic back pain.  No joint pain.  No muscle tenderness. Extremities:  No swelling.  No pain. Skin:  No rashes or skin changes. Neuro:  Dizzy on standing.  No headache, numbness or weakness, balance or coordination issues. Endocrine:  No diabetes. Thyroid disease on Synthroid.  No hot flashes or night sweats. Psych:  High anxiety (stable).  No mood changes or depression. Pain:  No focal pain. Review of systems:  All other systems reviewed and found to be negative.  Physical Exam: Blood pressure 124/79, pulse 97, temperature (!) 95 F (35 C), temperature source Tympanic, resp. rate 18, weight 141 lb 8 oz (64.2 kg). GENERAL:  Thin gentleman sitting comfortably in the exam room in a wheelchair in no acute distress.  MENTAL STATUS:  Alert and oriented to person, place and time. HEAD:  Pearline Cables hair.  Mustache.  Face slightly full and symmetric.  Normocephalic, atraumatic, face symmetric, no Cushingoid  features. EYES:  Glasses.  Blue eyes.  Pupils equal round and reactive to light and accomodation.  No conjunctivitis or scleral icterus. ENT:  Oropharynx clear without lesion.  Dentures.  Tongue normal. Mucous membranes moist.  RESPIRATORY:  Clear to auscultation without rales, wheezes or rhonchi. CARDIOVASCULAR:  Regular rate and rhythm without murmur, rub or gallop.  No JVD. ABDOMEN:  Soft, non-tender with active bowel sounds and no hepatosplenomegaly.  No masses. SKIN:  Ecchymosis on arms.  No rashes, ulcers or lesions. EXTREMITIES:No lower extremity edema.  No skin discoloration or tenderness.  No palpable cords. LYMPH NODES: No palpable cervical, supraclavicular, axillary or inguinal adenopathy.  NEUROLOGICAL: Unremarkable. PSYCH:  Anxious.    Infusion on 09/28/2015  Component Date Value Ref Range Status  . Magnesium 09/28/2015 1.8  1.7 - 2.4 mg/dL Final  . WBC 09/28/2015 10.5  3.8 - 10.6 K/uL Final  . RBC 09/28/2015 3.41* 4.40 - 5.90 MIL/uL Final  . Hemoglobin 09/28/2015 10.9* 13.0 - 18.0 g/dL Final  . HCT 09/28/2015 31.7* 40.0 - 52.0 % Final  . MCV 09/28/2015 92.9  80.0 - 100.0 fL Final  . MCH 09/28/2015 31.9  26.0 - 34.0 pg Final  . MCHC 09/28/2015 34.3  32.0 - 36.0 g/dL Final  . RDW 09/28/2015 15.5* 11.5 - 14.5 % Final  . Platelets 09/28/2015 180  150 - 440 K/uL Final  . Neutrophils Relative % 09/28/2015 73  % Final  . Neutro Abs 09/28/2015 7.7* 1.4 - 6.5 K/uL Final  . Lymphocytes Relative 09/28/2015 16  % Final  . Lymphs Abs 09/28/2015 1.6  1.0 - 3.6 K/uL Final  . Monocytes Relative 09/28/2015 10  % Final  . Monocytes Absolute 09/28/2015 1.0  0.2 - 1.0 K/uL Final  . Eosinophils Relative 09/28/2015 1  % Final  . Eosinophils Absolute 09/28/2015 0.1  0 - 0.7 K/uL Final  . Basophils Relative 09/28/2015 0  % Final  . Basophils Absolute 09/28/2015 0.0  0 - 0.1 K/uL Final  . Sodium 09/28/2015 135  135 - 145 mmol/L Final  . Potassium 09/28/2015 3.4* 3.5 - 5.1 mmol/L Final  .  Chloride 09/28/2015 99* 101 - 111 mmol/L Final  . CO2 09/28/2015 29  22 -  32 mmol/L Final  . Glucose, Bld 09/28/2015 115* 65 - 99 mg/dL Final  . BUN 09/28/2015 19  6 - 20 mg/dL Final  . Creatinine, Ser 09/28/2015 0.84  0.61 - 1.24 mg/dL Final  . Calcium 09/28/2015 8.9  8.9 - 10.3 mg/dL Final  . Total Protein 09/28/2015 6.3* 6.5 - 8.1 g/dL Final  . Albumin 09/28/2015 3.6  3.5 - 5.0 g/dL Final  . AST 09/28/2015 33  15 - 41 U/L Final  . ALT 09/28/2015 66* 17 - 63 U/L Final  . Alkaline Phosphatase 09/28/2015 97  38 - 126 U/L Final  . Total Bilirubin 09/28/2015 0.5  0.3 - 1.2 mg/dL Final  . GFR calc non Af Amer 09/28/2015 >60  >60 mL/min Final  . GFR calc Af Amer 09/28/2015 >60  >60 mL/min Final   Comment: (NOTE) The eGFR has been calculated using the CKD EPI equation. This calculation has not been validated in all clinical situations. eGFR's persistently <60 mL/min signify possible Chronic Hatfield Disease.   Georgiann Hahn gap 09/28/2015 7  5 - 15 Final    Assessment:  Darryl Hatfield is a 55 y.o. 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    He was diagnosed with stage IIIA squamous cell lung cancer in 07/02/2013.  PET scan on 06/23/2013 revealed a 1.5 cm right upper lobe nodule (SUV 7.4) concerning for primary bronchogenic carcinoma.  There were hypermetabolic ipsilateral hilar and mediastinal lymph node metastasis. There was malignant range FDG uptake associated with bilateral level 2 cervical lymph nodes of uncertain significance.  EBUS assisted biopsy of a right lower paratracheal and right hilar lymph node on 07/02/2013 confirmed non-small cell carcinoma, favoring squamous cell carcinoma.  Clinical stage was T1N2M0.  He received concurrent chemotherapy and radiation.  He received 6,000 cGy from 08/05/2014 - 09/14/2013.  He received weekly carboplatin and Taxol x 6 from 08/02/2013 - 10/25/2013.  Course was  complicated by a late pneumothorax secondary to port placement.  He also had a seizure in 03/2014 with subsequent fracture of lumbar vertebrae and rod placement.  PET scan on 04/12/2015 revealed mild soft tissue fullness along the posterior right upper lobe/perihilar region, with associated mild vague hypermetabolism (SUV 2.6).  There were hypermetabolic bilateral adrenal metastases, right (2.7 cm; SUV 10) greater than left (2.1 cm; SUV 7.8).  There was small hypermetabolic upper abdominal nodal metastases (9 mm porta hepatitis (SUV 7.3) and 7 mm portacaval node (SUV 5.3)).  CT guided right adrenal biopsy on 04/25/2015 revealed metastatic adenocarcinoma of lung primary.  TTF-1 was positive.  PD-L1 testing was negative (< 1%).  EGFR revealed no mutation.  There was no ALK rearrangement.  ROS1 revealed no result (unsuccessful).  RET gene rearrangement was negative.    CEA was 36.5 on 05/25/2015, 34 on 06/15/2015, and 35.3 on 07/06/2015, 48.8 on 07/27/2015, 62.5 on 08/17/2015, and 115.6 on 09/07/2015.  He has a mild normocytic anemia.  Anemia work-up on 04/05/2015 revealed iron deficiency anemia (ferritin 48, iron saturation 12%, TIBC 310).  Sedimentation rate was 27.  B12 was 255 (low normal) with a folate 13.8 (normal).  MMA was normal (246).  TSH was 8.840 (high) with normal T4 and T3U, and free thyroxine index.   His last colonoscopy was just prior to his diagnosis.  He has never had an EGD.  He denies any melena or hematochezia.  Diet is poor.  He is on oral iron.  Echo on 08/18/2014 revealed an EF of 45%.  Echo at [facility] on 09/19/2015 revealed an EF of 50%.  He received 5 cycles of carboplatin and Alimta  (05/25/2015 - 08/17/2015).  Chest, abdomen, and pelvic CT scan on 08/09/2015 revealed radiation changes in the right upper lobe extending to the perihilar region. There was stable mild soft tissue prominence laterally.  There were stable bilateral adrenal metastases.  There were mildly prominent  upper abdominal lymph nodes measuring up to 11 mm.  Bone scan on 08/11/2015 revealed mild multifocal areas of increased activity about the cervical and lumbar spine and right shoulder, most likely degenerative.   PET scan on 09/06/2015 revealed interval progression of nodal metastasis with new upper mediastinal hypermetabolic adenopathy, bilateral hypermetabolic lymph nodes adjacent to the tails of the parotid glands, and increased number of hypermetabolic periaortic lymph nodes.  There was stable to mildly increased bilateral adrenal metastasis.  He received 1 cycle of nivolumab (09/14/2015).  Course was complicated by colitis. Abdominal and pelvic CT scan on 09/19/2015 revealed pancolitis.  Flexible sigmoidoscopy on 09/21/2015 showed patchy nonspecific elevation sparing the rectum, worse in the sigmoid colon and up to the transverse colon.  Diarrhea and hematochezia stopped abruptly with steroids instituted on 09/19/2015.  He is currently on prednisone 60 mg a day.  He was admitted to [facility] from 09/17/2015 - 09/26/2015 with an NSTEMI.  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 There was 50% mid LAD and mid RCA lesion.  Chest CT angiogram on 09/21/2015 confirmed bilateral pulmonary emboli. Bilateral lower extremity duplex on 09/22/2015 revealed bilateral femoral DVTs.  An IVC filter was placed.  He is on Lovenox 60 mg every 12 hours.  Symptomatically, he is fatigued and dehydrated.  Exam reveals mild side effects secondary to steroids.  Plan: 1.  Labs today:  CBC with diff, CMP, Mg, . 2.  Review events of hospitalization. 3.  No chemotherapy today.  Discuss plan for no further nivolumab.  Discuss consideration of Taxotere post recovery from recent events. 4.  IVF NS.  Repeat orthostatics after fluid. 5.  Refill lovenox, prednisone (nurse to call) 6.  Continue prednisone 60 mg a day with plan to taper every 3-5 days as tolerated. 7.  RTC  in 1 week for MD assessment and labs (CBC with diff, CMP).   Lequita Asal, MD  09/28/2015, 11:35 AM

## 2015-09-28 NOTE — Telephone Encounter (Signed)
Called and spoke to wife and corcoran wanted to know how many days pt had been on prednisone 60 mg starting on tues so per corcoran after pt been on it 1 week he needs to drop down to 50 mg and that will be tues next week and she wrote it down. I told her that I sent rx in for lovenox but mary says they already have 1 month worth. I told her then don't pick up new rx for it.

## 2015-09-28 NOTE — Progress Notes (Signed)
Patient here as follow up from [facility] regarding bilateral PE's and bilateral DVTS in legs.  Patient having dizziness when standing.  Orthostatic Vitals: Lying 123/82  HR94 Standing 108/75  HR 103 Sitting 127/83   HR 93

## 2015-09-29 ENCOUNTER — Emergency Department
Admission: EM | Admit: 2015-09-29 | Discharge: 2015-09-29 | Disposition: A | Discharge status: Home / Self Care | Payer: BLUE CROSS/BLUE SHIELD | Attending: Emergency Medicine | Admitting: Emergency Medicine

## 2015-09-29 ENCOUNTER — Telehealth: Payer: Self-pay | Admitting: *Deleted

## 2015-09-29 ENCOUNTER — Emergency Department: Payer: BLUE CROSS/BLUE SHIELD

## 2015-09-29 DIAGNOSIS — J449 Chronic obstructive pulmonary disease, unspecified: Secondary | ICD-10-CM | POA: Insufficient documentation

## 2015-09-29 DIAGNOSIS — E039 Hypothyroidism, unspecified: Secondary | ICD-10-CM | POA: Insufficient documentation

## 2015-09-29 DIAGNOSIS — R51 Headache: Secondary | ICD-10-CM | POA: Insufficient documentation

## 2015-09-29 DIAGNOSIS — Z87891 Personal history of nicotine dependence: Secondary | ICD-10-CM | POA: Diagnosis not present

## 2015-09-29 DIAGNOSIS — Z7982 Long term (current) use of aspirin: Secondary | ICD-10-CM | POA: Insufficient documentation

## 2015-09-29 DIAGNOSIS — R519 Headache, unspecified: Secondary | ICD-10-CM

## 2015-09-29 DIAGNOSIS — Z79899 Other long term (current) drug therapy: Secondary | ICD-10-CM | POA: Diagnosis not present

## 2015-09-29 DIAGNOSIS — I509 Heart failure, unspecified: Secondary | ICD-10-CM | POA: Insufficient documentation

## 2015-09-29 DIAGNOSIS — R209 Unspecified disturbances of skin sensation: Secondary | ICD-10-CM | POA: Diagnosis not present

## 2015-09-29 DIAGNOSIS — Z85118 Personal history of other malignant neoplasm of bronchus and lung: Secondary | ICD-10-CM | POA: Diagnosis not present

## 2015-09-29 DIAGNOSIS — R202 Paresthesia of skin: Secondary | ICD-10-CM

## 2015-09-29 DIAGNOSIS — K2970 Gastritis, unspecified, without bleeding: Secondary | ICD-10-CM | POA: Diagnosis not present

## 2015-09-29 LAB — BASIC METABOLIC PANEL
ANION GAP: 6 (ref 5–15)
BUN: 16 mg/dL (ref 6–20)
CO2: 32 mmol/L (ref 22–32)
Calcium: 9.3 mg/dL (ref 8.9–10.3)
Chloride: 100 mmol/L — ABNORMAL LOW (ref 101–111)
Creatinine, Ser: 0.85 mg/dL (ref 0.61–1.24)
GFR calc Af Amer: >60 mL/min (ref >60)
GFR calc non Af Amer: >60 mL/min (ref >60)
Glucose, Bld: 174 mg/dL — ABNORMAL HIGH (ref 65–99)
POTASSIUM: 4.3 mmol/L (ref 3.5–5.1)
SODIUM: 138 mmol/L (ref 135–145)

## 2015-09-29 LAB — CBC
HCT: 33.6 % — ABNORMAL LOW (ref 40.0–52.0)
Hemoglobin: 11.4 g/dL — ABNORMAL LOW (ref 13.0–18.0)
MCH: 32.2 pg (ref 26.0–34.0)
MCHC: 34 g/dL (ref 32.0–36.0)
MCV: 94.8 fL (ref 80.0–100.0)
Platelets: 192 10*3/uL (ref 150–440)
RBC: 3.55 MIL/uL — ABNORMAL LOW (ref 4.40–5.90)
RDW: 15.8 % — AB (ref 11.5–14.5)
WBC: 10.5 10*3/uL (ref 3.8–10.6)

## 2015-09-29 LAB — TROPONIN I: Troponin I: <0.03 ng/mL (ref <0.03)

## 2015-09-29 MED ORDER — ACETAMINOPHEN 500 MG PO TABS
ORAL_TABLET | ORAL | Status: AC
  Administered 2015-09-29: 1000 mg via ORAL
  Filled 2015-09-29: qty 2

## 2015-09-29 MED ORDER — ACETAMINOPHEN 500 MG PO TABS
1000.0000 mg | ORAL_TABLET | Freq: Once | ORAL | Status: AC
  Administered 2015-09-29: 1000 mg via ORAL

## 2015-09-29 MED ORDER — ALUMINUM-MAGNESIUM-SIMETHICONE 200-200-20 MG/5ML PO SUSP: 30.0000 mL | Freq: Three times a day (TID) | ORAL | 0 refills | Status: DC

## 2015-09-29 MED ORDER — HEPARIN SOD (PORK) LOCK FLUSH 100 UNIT/ML IV SOLN
500.0000 [IU] | Freq: Once | INTRAVENOUS | Status: AC
  Administered 2015-09-29: 500 [IU] via INTRAVENOUS

## 2015-09-29 MED ORDER — METOCLOPRAMIDE HCL 5 MG/ML IJ SOLN
10.0000 mg | Freq: Once | INTRAMUSCULAR | Status: AC
  Administered 2015-09-29: 10 mg via INTRAVENOUS

## 2015-09-29 MED ORDER — GI COCKTAIL ~~LOC~~
30.0000 mL | ORAL | Status: AC
  Administered 2015-09-29: 30 mL via ORAL
  Filled 2015-09-29: qty 30

## 2015-09-29 MED ORDER — HEPARIN SOD (PORK) LOCK FLUSH 100 UNIT/ML IV SOLN
INTRAVENOUS | Status: AC
  Administered 2015-09-29: 500 [IU] via INTRAVENOUS
  Filled 2015-09-29: qty 5

## 2015-09-29 MED ORDER — METOCLOPRAMIDE HCL 10 MG PO TABS: 10.0000 mg | ORAL_TABLET | Freq: Four times a day (QID) | ORAL | 0 refills | Status: DC | PRN

## 2015-09-29 MED ORDER — SODIUM CHLORIDE 0.9 % IV BOLUS (SEPSIS)
500.0000 mL | Freq: Once | INTRAVENOUS | Status: AC
  Administered 2015-09-29: 500 mL via INTRAVENOUS

## 2015-09-29 MED ORDER — FAMOTIDINE 20 MG PO TABS
40.0000 mg | ORAL_TABLET | Freq: Once | ORAL | Status: AC
  Administered 2015-09-29: 40 mg via ORAL
  Filled 2015-09-29: qty 2

## 2015-09-29 MED ORDER — METOCLOPRAMIDE HCL 5 MG/ML IJ SOLN
INTRAMUSCULAR | Status: AC
  Administered 2015-09-29: 10 mg via INTRAVENOUS
  Filled 2015-09-29: qty 2

## 2015-09-29 NOTE — Telephone Encounter (Signed)
Called to inform us that PCP sent him to ER for EKG and CXR  And eval of cardiac status

## 2015-09-29 NOTE — ED Provider Notes (Signed)
[facility] Emergency Department Provider Note  ____________________________________________  Time seen: Approximately 4:45 PM  I have reviewed the triage vital signs and the nursing notes.   HISTORY  Chief Complaint Chest Pain    HPI Darryl MCPHEETERS is a 55 y.o. male who complains of dizziness and right arm paresthesia that started today at about 12:30 PM. He went to his doctor's office who was concerned that he could be having a stroke and instructed him to come to the ED for evaluation. On reviewing the clinic note from the electronic medical record, they note a possible facial droop and possibly weaker right grip strength but no overt signs.  The patient was just discharged from [facility] where he was hospitalized for about a week due to an STEMI, bilateral DVTs, bilateral PEs, and colitis which was thought to be related to his chemotherapy agent. He does have a history of lung cancer which is metastatic. An IVC filter was placed, and the patient was started on Lovenox for the DVT and PE as well as a prednisone taper to treat the colitis.He does have some ongoing chest discomfort which is not changed and is not exertional. He has been compliant with his medications since leaving the hospital.  He currently feels that the paresthesia in his arm has resolved. Denies any receptive or expressive aphasia symptoms or dysarthria. Denies any other cranial nerve symptoms. No fevers. No acute changes and chest pain, no shortness of breath or cough.     Past Medical History:  Diagnosis Date  . CHF (congestive heart failure) ([place])   . COPD (chronic obstructive pulmonary disease) ([place])   . Hypothyroidism   . Lung cancer ([place]) 2015   RIght   . Seizures [facility])      Patient Active Problem List   Diagnosis Date Noted  . Dehydration 09/28/2015  . Epigastric discomfort 09/16/2015  . STEMI (ST elevation myocardial infarction) ([place]) 09/16/2015  . Pruritus  07/27/2015  . Constipation 06/05/2015  . Anxiety 05/12/2015  . Weight loss 05/12/2015  . 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 Adenocarcinoma of lung, stage 4 ([place]) 04/25/2015  . Iron deficiency anemia 04/05/2015  . Episodes of formed visual hallucinations 12/26/2014  . Dementia with behavioral disturbance 12/26/2014  . Delirium 12/25/2014  . Cancer of upper lobe of right lung ([place]) 07/02/2013  . SCIATICA 03/01/2010     Past Surgical History:  Procedure Laterality Date  . BACK SURGERY    . PORTACATH PLACEMENT Right      Prior to Admission medications   Medication Sig Start Date End Date Taking? Authorizing Provider  acetaminophen (TYLENOL) 325 MG tablet Take 650 mg by mouth every 6 (six) hours as needed for mild pain.  04/21/14  Yes Historical Provider, MD  albuterol (PROVENTIL) (2.5 MG/3ML) 0.083% nebulizer solution Take 3 mLs (2.5 mg total) by nebulization every 6 (six) hours as needed for wheezing or shortness of breath. Patient taking differently: Take 2.5 mg by nebulization 2 (two) times daily.  02/03/15  Yes Dmitriy Berenzon, MD  aspirin 81 MG EC tablet  09/25/15  Yes Historical Provider, MD  atorvastatin (LIPITOR) 80 MG tablet Take 80 mg by mouth daily.  09/25/15 10/25/15 Yes Historical Provider, MD  Calcium Carbonate-Vitamin D 600-400 MG-UNIT tablet Take 1 tablet by mouth 2 (two) times daily.  09/26/15 09/25/16 Yes Historical Provider, MD  DULoxetine (CYMBALTA) 30 MG capsule Take 30 mg by mouth daily.   Yes Historical Provider, MD  enoxaparin (  LOVENOX) 60 MG/0.6ML injection Inject 0.6 mLs (60 mg total) into the skin every 12 (twelve) hours. 09/28/15 10/28/15 Yes Lequita Asal, MD  folic acid (FOLVITE) 1 MG tablet TAKE 1 TABLET BY MOUTH DAILY START 5-7 DAYS BEFORE ALIMTA CHEMO, CONTINUE UNTIL 21 DAYS AFTER ALIMTA 09/13/15  Yes Lequita Asal, MD  gabapentin (NEURONTIN) 100 MG capsule Take 1 capsule (100 mg total) by mouth 3 (three) times daily. 03/15/15  Yes Dmitriy  Berenzon, MD  hydrOXYzine (ATARAX/VISTARIL) 10 MG tablet Take 1 tablet (10 mg total) by mouth 3 (three) times daily as needed for itching. 08/17/15  Yes Lequita Asal, MD  levothyroxine (SYNTHROID, LEVOTHROID) 88 MCG tablet Take 1 tablet (88 mcg total) by mouth daily before breakfast. Patient taking differently: Take 44 mcg by mouth daily before breakfast.  06/16/15  Yes Lequita Asal, MD  LORazepam (ATIVAN) 0.5 MG tablet Take 1 tablet (0.5 mg total) by mouth every 8 (eight) hours as needed for anxiety. 06/16/15  Yes Lequita Asal, MD  metoprolol succinate (TOPROL-XL) 25 MG 24 hr tablet  09/26/15  Yes Historical Provider, MD  nitroGLYCERIN (NITROSTAT) 0.4 MG SL tablet Place 0.4 mg under the tongue every 5 (five) minutes as needed.  09/25/15  Yes Historical Provider, MD  ondansetron (ZOFRAN) 8 MG tablet Take 1 tablet (8 mg total) by mouth every 8 (eight) hours as needed for nausea or vomiting. 09/07/15  Yes Lequita Asal, MD  pantoprazole (PROTONIX) 40 MG tablet Take by mouth. 09/25/15 10/25/15 Yes Historical Provider, MD  predniSONE (DELTASONE) 20 MG tablet 60 mg.  09/25/15  Yes Historical Provider, MD  QUEtiapine (SEROQUEL) 50 MG tablet Take 50 mg by mouth daily.  07/18/15 01/14/16 Yes Historical Provider, MD  simethicone (MYLICON) 80 MG chewable tablet Chew 80 mg by mouth daily.  09/25/15 10/05/15 Yes Historical Provider, MD  aluminum-magnesium hydroxide-simethicone (MAALOX) 195-093-26 MG/5ML SUSP Take 30 mLs by mouth 4 (four) times daily -  before meals and at bedtime. 09/29/15   Carrie Mew, MD  dexamethasone (DECADRON) 4 MG tablet TAKE 1 TAB 2 TIMES A DAY BEFORE CHEMO. TAKE 2 TABS TWO TIMES A DAY STARTING THE DAY AFTER CHEMO 08/17/15   Historical Provider, MD  metoCLOPramide (REGLAN) 10 MG tablet Take 1 tablet (10 mg total) by mouth every 6 (six) hours as needed. 09/29/15   Carrie Mew, MD  tiotropium (SPIRIVA) 18 MCG inhalation capsule Place 18 mcg into inhaler and inhale daily.     Historical Provider, MD     Allergies No known allergies   Family History  Problem Relation Age of Onset  . Hypertension Other   . Heart failure Other   . Cancer Father     Social History Social History  Substance Use Topics  . Smoking status: Former Smoker    Packs/day: 1.00    Years: 40.00    Quit date: 06/20/2013  . Smokeless tobacco: Never Used  . Alcohol use No    Review of Systems  Constitutional:   No fever or chills. Positive fatigue ENT:   No sore throat. No rhinorrhea. Cardiovascular:   Positive subacute to chronic chest pain. Respiratory:   No dyspnea or cough. Gastrointestinal:   Negative for abdominal pain, vomiting and diarrhea. Complains of burning in the stomach, does take a PPI.  Genitourinary:   Negative for dysuria or difficulty urinating. Musculoskeletal:   Positive pain in bilateral calves and bruising in the right calf. Neurological:   Positive generalized headache earlier today which is  now resolved. Positive dizziness which is now resolved. Right arm paresthesia which is resolved. 10-point ROS otherwise negative.  ____________________________________________   PHYSICAL EXAM:  VITAL SIGNS: ED Triage Vitals  Enc Vitals Group     BP 09/29/15 1448 129/82     Pulse Rate 09/29/15 1448 91     Resp 09/29/15 1448 18     Temp 09/29/15 1448 98.3 F (36.8 C)     Temp Source 09/29/15 1448 Oral     SpO2 09/29/15 1448 100 %     Weight 09/29/15 1449 141 lb (64 kg)     Height 09/29/15 1449 '5\' 8"'$  (1.727 m)     Head Circumference --      Peak Flow --      Pain Score 09/29/15 1449 10     Pain Loc --      Pain Edu? --      Excl. in [place]? --     Vital signs reviewed, nursing assessments reviewed.   Constitutional:   Alert and oriented. Well appearing and in no distress. Eyes:   No scleral icterus. No conjunctival pallor. PERRL. EOMI.  No nystagmus. ENT   Head:   Normocephalic and atraumatic.   Nose:   No congestion/rhinnorhea. No septal  hematoma   Mouth/Throat:   MMM, no pharyngeal erythema. No peritonsillar mass.    Neck:   No stridor. No SubQ emphysema. No meningismus. Hematological/Lymphatic/Immunilogical:   No cervical lymphadenopathy. Cardiovascular:   RRR. Symmetric bilateral radial and DP pulses.  No murmurs.  Respiratory:   Normal respiratory effort without tachypnea nor retractions. Breath sounds are clear and equal bilaterally. No wheezes/rales/rhonchi. Port-A-Cath in place in the right chest Gastrointestinal:   Soft with diffuse tenderness in the upper abdomen. Non distended. There is no CVA tenderness.  No rebound, rigidity, or guarding. Genitourinary:   deferred Musculoskeletal:   Nontender with normal range of motion in all extremities. No joint effusions.  No significant lower extremity tenderness.  No edema. Neurologic:   Normal speech and language.  CN 2-10 normal. Motor intact and symmetric in grips biceps and triceps and lower extremity large muscle groups. No pronator drift Normal to nose bilaterally No gross focal neurologic deficits are appreciated.  Skin:    Skin is warm, dry and intact. No rash noted.  No petechiae, purpura, or bullae. Positive ecchymosis on the right calf, soft, not raised. Not consistent with hematoma or elevated compartment pressure.  ____________________________________________    LABS (pertinent positives/negatives) (all labs ordered are listed, but only abnormal results are displayed) Labs Reviewed  BASIC METABOLIC PANEL - Abnormal; Notable for the following:       Result Value   Chloride 100 (*)    Glucose, Bld 174 (*)    All other components within normal limits  CBC - Abnormal; Notable for the following:    RBC 3.55 (*)    Hemoglobin 11.4 (*)    HCT 33.6 (*)    RDW 15.8 (*)    All other components within normal limits  TROPONIN I   ____________________________________________   EKG  Normal sinus rhythm rate of 89, normal axis intervals QRS ST segments  and T waves. There is a incomplete right bundle branch block.  ____________________________________________    RADIOLOGY  Chest x-ray unremarkable MRI brain Unremarkable Ultrasound duplex carotid bilateral unremarkable    09/19/15 TTE performed at [facility]: EF 50%, regional impairment of anterior and inferior LV INTERPRETATION --------------------------------------------------------------- MILD LV DYSFUNCTION (See above) NORMAL LA PRESSURES WITH DIASTOLIC  DYSFUNCTION MILD RV SYSTOLIC DYSFUNCTION (See above) VALVULAR REGURGITATION: TRIVIAL MR, TRIVIAL TR Compared with prior Echo study on 08/18/2014: NO SIGNIFICANT CHANGE ____________________________________________   PROCEDURES Procedures  ____________________________________________   INITIAL IMPRESSION / ASSESSMENT AND PLAN / ED COURSE  Pertinent labs & imaging results that were available during my care of the patient were reviewed by me and considered in my medical decision making (see chart for details).  Patient well appearing no acute distress. Complains of neurologic symptoms that have since resolved. Possible TIA, but he does have a history of anxiety and reports that when he has symptoms like this they usually resolve with Ativan. He is on anticoagulants already due to recent diagnoses of venous thromboembolism.     Clinical Course    ----------------------------------------- 8:29 PM on 09/29/2015 -----------------------------------------  MRI negative. Ultrasound carotids negative. Symptoms remain controlled without any recurrence of neurologic symptoms. He does complain of a bilateral frontal headache. Will give Reglan and Tylenol which will treat his headache and also likely improve his gastritis symptoms that he is having from high-dose prednisone. Low suspicion for perforation obstruction or GI bleeding. He is well appearing and will follow-up with his primary care doctor 3 days. Return precautions  given. Amenable to this plan. Starting on blood thinners and I do not think he requires further treatment for TIA or stroke at this time. Primary care can continue with risk factor modification. ____________________________________________   FINAL CLINICAL IMPRESSION(S) / ED DIAGNOSES  Final diagnoses:  Acute nonintractable headache, unspecified headache type  Gastritis  Paresthesia of right arm       Portions of this note were generated with dragon dictation software. Dictation errors may occur despite best attempts at proofreading.    Carrie Mew, MD 09/29/15 2032

## 2015-09-29 NOTE — ED Notes (Signed)
Pt returned from US

## 2015-09-29 NOTE — Telephone Encounter (Signed)
Patient called complaining of pain across his chest, difficulty breathing and swallowing. I advised him to go to ER as he just had an MI 2 weeks ago

## 2015-09-29 NOTE — ED Notes (Signed)
Patient transported to MRI 

## 2015-09-29 NOTE — Telephone Encounter (Signed)
I called [name] to inquire if she has taken him to the ER and she told me No, he took NTG and his chest stopped hurting and he is no longer SOB, she did take him to have his BP checked and it was nml. She told me he has an appt with PCP this afternoon

## 2015-09-29 NOTE — ED Triage Notes (Signed)
Pt arrives to ER via POV c/o intermittent cp to central part of chest. Pt recent discharge from hospital for MI per patient. Pt alert and oriented X4, active, cooperative, pt in NAD. RR even and unlabored, color WNL.

## 2015-09-29 NOTE — ED Notes (Signed)
Pt sts that he went to [facility] for hospital follow up today. Stated that he had episode of CP this AM, relieved by 1 SL nitro.  Pt also c/o tingling in R hand and pain in L leg, nausea w/ no vomiting.  Sts he has been eating and drinking w/o issue.

## 2015-10-05 ENCOUNTER — Inpatient Hospital Stay: Payer: BLUE CROSS/BLUE SHIELD

## 2015-10-05 ENCOUNTER — Encounter: Payer: Self-pay | Admitting: Hematology and Oncology

## 2015-10-05 ENCOUNTER — Other Ambulatory Visit: Payer: Self-pay

## 2015-10-05 ENCOUNTER — Other Ambulatory Visit: Payer: Self-pay | Admitting: *Deleted

## 2015-10-05 ENCOUNTER — Inpatient Hospital Stay (HOSPITAL_BASED_OUTPATIENT_CLINIC_OR_DEPARTMENT_OTHER): Payer: BLUE CROSS/BLUE SHIELD | Admitting: Hematology and Oncology

## 2015-10-05 VITALS — BP 139/88 | HR 94 | Temp 97.9°F | Resp 18 | Wt 141.1 lb

## 2015-10-05 DIAGNOSIS — C778 Secondary and unspecified malignant neoplasm of lymph nodes of multiple regions: Secondary | ICD-10-CM | POA: Diagnosis not present

## 2015-10-05 DIAGNOSIS — C3490 Malignant neoplasm of unspecified part of unspecified bronchus or lung: Secondary | ICD-10-CM

## 2015-10-05 DIAGNOSIS — I82413 Acute embolism and thrombosis of femoral vein, bilateral: Secondary | ICD-10-CM

## 2015-10-05 DIAGNOSIS — C7972 Secondary malignant neoplasm of left adrenal gland: Secondary | ICD-10-CM | POA: Diagnosis not present

## 2015-10-05 DIAGNOSIS — C3411 Malignant neoplasm of upper lobe, right bronchus or lung: Secondary | ICD-10-CM | POA: Diagnosis not present

## 2015-10-05 DIAGNOSIS — Z7901 Long term (current) use of anticoagulants: Secondary | ICD-10-CM

## 2015-10-05 DIAGNOSIS — K529 Noninfective gastroenteritis and colitis, unspecified: Secondary | ICD-10-CM

## 2015-10-05 DIAGNOSIS — D509 Iron deficiency anemia, unspecified: Secondary | ICD-10-CM

## 2015-10-05 DIAGNOSIS — R5383 Other fatigue: Secondary | ICD-10-CM

## 2015-10-05 DIAGNOSIS — L299 Pruritus, unspecified: Secondary | ICD-10-CM

## 2015-10-05 DIAGNOSIS — G8929 Other chronic pain: Secondary | ICD-10-CM

## 2015-10-05 DIAGNOSIS — E860 Dehydration: Secondary | ICD-10-CM

## 2015-10-05 DIAGNOSIS — C7800 Secondary malignant neoplasm of unspecified lung: Secondary | ICD-10-CM | POA: Diagnosis not present

## 2015-10-05 DIAGNOSIS — I214 Non-ST elevation (NSTEMI) myocardial infarction: Secondary | ICD-10-CM

## 2015-10-05 DIAGNOSIS — Z79899 Other long term (current) drug therapy: Secondary | ICD-10-CM

## 2015-10-05 DIAGNOSIS — I2699 Other pulmonary embolism without acute cor pulmonale: Secondary | ICD-10-CM

## 2015-10-05 DIAGNOSIS — C7971 Secondary malignant neoplasm of right adrenal gland: Secondary | ICD-10-CM

## 2015-10-05 LAB — CBC WITH DIFFERENTIAL/PLATELET
Basophils Absolute: 0 10*3/uL (ref 0–0.1)
Basophils Relative: 0 %
Eosinophils Absolute: 0 10*3/uL (ref 0–0.7)
Eosinophils Relative: 0 %
HCT: 33.8 % — ABNORMAL LOW (ref 40.0–52.0)
Hemoglobin: 11 g/dL — ABNORMAL LOW (ref 13.0–18.0)
Lymphocytes Relative: 5 %
Lymphs Abs: 0.6 10*3/uL — ABNORMAL LOW (ref 1.0–3.6)
MCH: 30.8 pg (ref 26.0–34.0)
MCHC: 32.6 g/dL (ref 32.0–36.0)
MCV: 94.6 fL (ref 80.0–100.0)
Monocytes Absolute: 0.4 10*3/uL (ref 0.2–1.0)
Monocytes Relative: 3 %
Neutro Abs: 11.1 10*3/uL — ABNORMAL HIGH (ref 1.4–6.5)
Neutrophils Relative %: 92 %
Platelets: 211 10*3/uL (ref 150–440)
RBC: 3.58 MIL/uL — ABNORMAL LOW (ref 4.40–5.90)
RDW: 15.9 % — ABNORMAL HIGH (ref 11.5–14.5)
WBC: 12.1 10*3/uL — ABNORMAL HIGH (ref 3.8–10.6)

## 2015-10-05 LAB — COMPREHENSIVE METABOLIC PANEL
ALT: 56 U/L (ref 17–63)
AST: 25 U/L (ref 15–41)
Albumin: 3.9 g/dL (ref 3.5–5.0)
Alkaline Phosphatase: 78 U/L (ref 38–126)
Anion gap: 7 (ref 5–15)
BUN: 18 mg/dL (ref 6–20)
CO2: 31 mmol/L (ref 22–32)
Calcium: 8.8 mg/dL — ABNORMAL LOW (ref 8.9–10.3)
Chloride: 97 mmol/L — ABNORMAL LOW (ref 101–111)
Creatinine, Ser: 0.83 mg/dL (ref 0.61–1.24)
GFR calc Af Amer: >60 mL/min (ref >60)
GFR calc non Af Amer: >60 mL/min (ref >60)
Glucose, Bld: 132 mg/dL — ABNORMAL HIGH (ref 65–99)
Potassium: 4.1 mmol/L (ref 3.5–5.1)
Sodium: 135 mmol/L (ref 135–145)
Total Bilirubin: 0.5 mg/dL (ref 0.3–1.2)
Total Protein: 6.8 g/dL (ref 6.5–8.1)

## 2015-10-05 MED ORDER — PREDNISONE 20 MG PO TABS: 40.0000 mg | ORAL_TABLET | Freq: Every day | ORAL | 0 refills | Status: DC

## 2015-10-05 MED ORDER — HYDROXYZINE HCL 10 MG PO TABS: 10.0000 mg | ORAL_TABLET | Freq: Three times a day (TID) | ORAL | 1 refills | Status: AC | PRN

## 2015-10-05 NOTE — Progress Notes (Signed)
[facility] day:  10/05/15    Chief Complaint: Darryl Hatfield is a 55 y.o. 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  HPI:  The patient was last seen in the medical oncology clinic on 09/28/2015.  At that time, he was seen for reassessment after interval hospitalization at [facility] for a myocardial infarction.  Course was complicated by colitis, bilateral femoral DVTs and pulmonary emboli.  His thyroid medication was being being adjusted by endocrinology.  He was on prednisone 60 mg a day for his colitis.  He was dehydrated and received IVF in clinic.  Prednisone was decreased to 50 mg a day on 10/03/2015.  He was seen in the ER on 09/29/2015 with dizziness, shortness of breath, right arm parethesias, and chest pain.  Head MRI without contrast revealed no acute intracranial process.  Carotid duplex revealed minimal plaque of the right carotid bulb and no hemodynamically significant stenosis.  Paresthesias resolved.  He was felt to have a TIA or symptoms secondary to anxiety.  He notes transient chest pain this morning.  He saw his cardiologist this morning.  He notes that some days are better than others. He is tired after waking up.  He had some nausea this morning.  Knees hurt.  He walked into clinic (improved from last week when he was in a wheelchair).   Past Medical History:  Diagnosis Date  . CHF (congestive heart failure) ([place])   . COPD (chronic obstructive pulmonary disease) ([place])   . Hypothyroidism   . Lung cancer ([place]) 2015   RIght   . Seizures ([place])     Past Surgical History:  Procedure Laterality Date  . BACK SURGERY    . PORTACATH PLACEMENT Right     Family History  Problem Relation Age of Onset  . Hypertension Other   . Heart failure Other   . Cancer Father     Social History:  reports that  he quit smoking about 2 years ago. He has a 40.00 pack-year smoking history. He has never used smokeless tobacco. He reports that he does not drink alcohol or use drugs.  He is on disability.  He is accompanied by his wife, Darryl Hatfield.  Allergies:  Allergies  Allergen Reactions  . No Known Allergies     Current Medications: Current Outpatient Prescriptions  Medication Sig Dispense Refill  . acetaminophen (TYLENOL) 325 MG tablet Take 650 mg by mouth every 6 (six) hours as needed for mild pain.     Marland Kitchen albuterol (PROVENTIL) (2.5 MG/3ML) 0.083% nebulizer solution Take 3 mLs (2.5 mg total) by nebulization every 6 (six) hours as needed for wheezing or shortness of breath. (Patient taking differently: Take 2.5 mg by nebulization 2 (two) times daily. ) 75 mL 2  . aluminum-magnesium hydroxide-simethicone (MAALOX) 102-585-27 MG/5ML SUSP Take 30 mLs by mouth 4 (four) times daily -  before meals and at bedtime. 355 mL 0  . aspirin 81 MG EC tablet   0  . atorvastatin (LIPITOR) 80 MG tablet Take 80 mg by mouth daily.     . Calcium Carbonate-Vitamin D 600-400 MG-UNIT tablet Take 1 tablet by mouth 2 (two) times daily.     Marland Kitchen dexamethasone (DECADRON) 4 MG tablet TAKE 1 TAB 2 TIMES A DAY BEFORE CHEMO. TAKE 2 TABS TWO TIMES A DAY STARTING THE DAY AFTER  CHEMO  1  . DULoxetine (CYMBALTA) 30 MG capsule Take 30 mg by mouth daily.    Marland Kitchen enoxaparin (LOVENOX) 60 MG/0.6ML injection Inject 0.6 mLs (60 mg total) into the skin every 12 (twelve) hours. 60 Syringe 0  . folic acid (FOLVITE) 1 MG tablet TAKE 1 TABLET BY MOUTH DAILY START 5-7 DAYS BEFORE ALIMTA CHEMO, CONTINUE UNTIL 21 DAYS AFTER ALIMTA 100 tablet 3  . gabapentin (NEURONTIN) 100 MG capsule Take 1 capsule (100 mg total) by mouth 3 (three) times daily. 90 capsule 4  . hydrOXYzine (ATARAX/VISTARIL) 10 MG tablet Take 1 tablet (10 mg total) by mouth 3 (three) times daily as needed for itching. 60 tablet 1  . levothyroxine (SYNTHROID, LEVOTHROID) 88 MCG tablet Take 1 tablet  (88 mcg total) by mouth daily before breakfast. (Patient taking differently: Take 44 mcg by mouth daily before breakfast. ) 30 tablet 0  . LORazepam (ATIVAN) 0.5 MG tablet Take 1 tablet (0.5 mg total) by mouth every 8 (eight) hours as needed for anxiety. 30 tablet 0  . metoCLOPramide (REGLAN) 10 MG tablet Take 1 tablet (10 mg total) by mouth every 6 (six) hours as needed. 30 tablet 0  . metoprolol succinate (TOPROL-XL) 25 MG 24 hr tablet   2  . nitroGLYCERIN (NITROSTAT) 0.4 MG SL tablet Place 0.4 mg under the tongue every 5 (five) minutes as needed.   0  . ondansetron (ZOFRAN) 8 MG tablet Take 1 tablet (8 mg total) by mouth every 8 (eight) hours as needed for nausea or vomiting. 30 tablet 3  . pantoprazole (PROTONIX) 40 MG tablet Take by mouth.    . predniSONE (DELTASONE) 20 MG tablet 60 mg.   0  . QUEtiapine (SEROQUEL) 50 MG tablet Take 50 mg by mouth daily.     . simethicone (MYLICON) 80 MG chewable tablet Chew 80 mg by mouth daily.     Marland Kitchen tiotropium (SPIRIVA) 18 MCG inhalation capsule Place 18 mcg into inhaler and inhale daily.     No current facility-administered medications for this visit.    Facility-Administered Medications Ordered in Other Visits  Medication Dose Route Frequency Provider Last Rate Last Dose  . sodium chloride 0.9 % injection 10 mL  10 mL Intracatheter PRN Forest Gleason, MD   10 mL at 06/27/14 1338  . sodium chloride 0.9 % injection 10 mL  10 mL Intracatheter PRN Leia Alf, MD   10 mL at 08/22/14 0851  . sodium chloride flush (NS) 0.9 % injection 10 mL  10 mL Intravenous PRN Lequita Asal, MD        Review of Systems:  GENERAL:  Tired after waking up.  No fever or sweats.  Weight stable. PERFORMANCE STATUS (ECOG):  2 HEENT:  No visual changes, runny nose, sore throat, mouth sores or tenderness. Lungs: Shortness of breath with exertion.  No cough.  No hemoptysis. Cardiac:  Chest pain this morning.  No palpitations, orthopnea, or PND. GI:  No nausea, vomiting,  diarrhea, melena or hematochezia.  GU:  No urgency, frequency, dysuria, or hematuria. Musculoskeletal:  Chronic back pain.  Knee pain.  No muscle tenderness. Extremities:  No swelling or pain. Skin:  No rashes or skin changes. Neuro:  No headache, numbness or weakness, balance or coordination issues. Endocrine:  No diabetes. Thyroid disease on Synthroid.  No hot flashes or night sweats. Psych:  High anxiety (stable).  No mood changes or depression. Pain:  No focal pain. Review of systems:  All other systems reviewed and  found to be negative.  Physical Exam: Blood pressure 139/88, pulse 94, temperature 97.9 F (36.6 C), temperature source Tympanic, resp. rate 18, weight 141 lb 1.5 oz (64 kg). GENERAL:  Thin gentleman sitting comfortably in the exam room in no acute distress.  MENTAL STATUS:  Alert and oriented to person, place and time. HEAD:  Black hair with slight graying.  Mustache.  Face full s/p steroids.  Normocephalic, atraumatic, face symmetric, no Cushingoid features. EYES:  Glasses.  Blue eyes.  Pupils equal round and reactive to light and accomodation.  No conjunctivitis or scleral icterus. ENT:  Oropharynx clear without lesion.  Dentures.  Tongue normal. Mucous membranes moist.  RESPIRATORY:  Clear to auscultation without rales, wheezes or rhonchi. CARDIOVASCULAR:  Regular rate and rhythm without murmur, rub or gallop.  No JVD. ABDOMEN:  Soft, non-tender with active bowel sounds and no hepatosplenomegaly.  No masses. SKIN:  Abdominal bruises s/p Lovenox.  Pruritus.  No rashes, ulcers or lesions. EXTREMITIES:  No lower extremity edema.  No skin discoloration or tenderness.  No palpable cords. LYMPH NODES: No palpable cervical, supraclavicular, axillary or inguinal adenopathy.  NEUROLOGICAL: Unremarkable. PSYCH:  Anxious.    Orders Only on 10/05/2015  Component Date Value Ref Range Status  . WBC 10/05/2015 12.1* 3.8 - 10.6 K/uL Final  . RBC 10/05/2015 3.58* 4.40 - 5.90 MIL/uL  Final  . Hemoglobin 10/05/2015 11.0* 13.0 - 18.0 g/dL Final  . HCT 10/05/2015 33.8* 40.0 - 52.0 % Final  . MCV 10/05/2015 94.6  80.0 - 100.0 fL Final  . MCH 10/05/2015 30.8  26.0 - 34.0 pg Final  . MCHC 10/05/2015 32.6  32.0 - 36.0 g/dL Final  . RDW 10/05/2015 15.9* 11.5 - 14.5 % Final  . Platelets 10/05/2015 211  150 - 440 K/uL Final  . Neutrophils Relative % 10/05/2015 92  % Final  . Neutro Abs 10/05/2015 11.1* 1.4 - 6.5 K/uL Final  . Lymphocytes Relative 10/05/2015 5  % Final  . Lymphs Abs 10/05/2015 0.6* 1.0 - 3.6 K/uL Final  . Monocytes Relative 10/05/2015 3  % Final  . Monocytes Absolute 10/05/2015 0.4  0.2 - 1.0 K/uL Final  . Eosinophils Relative 10/05/2015 0  % Final  . Eosinophils Absolute 10/05/2015 0.0  0 - 0.7 K/uL Final  . Basophils Relative 10/05/2015 0  % Final  . Basophils Absolute 10/05/2015 0.0  0 - 0.1 K/uL Final  . Sodium 10/05/2015 135  135 - 145 mmol/L Final  . Potassium 10/05/2015 4.1  3.5 - 5.1 mmol/L Final  . Chloride 10/05/2015 97* 101 - 111 mmol/L Final  . CO2 10/05/2015 31  22 - 32 mmol/L Final  . Glucose, Bld 10/05/2015 132* 65 - 99 mg/dL Final  . BUN 10/05/2015 18  6 - 20 mg/dL Final  . Creatinine, Ser 10/05/2015 0.83  0.61 - 1.24 mg/dL Final  . Calcium 10/05/2015 8.8* 8.9 - 10.3 mg/dL Final  . Total Protein 10/05/2015 6.8  6.5 - 8.1 g/dL Final  . Albumin 10/05/2015 3.9  3.5 - 5.0 g/dL Final  . AST 10/05/2015 25  15 - 41 U/L Final  . ALT 10/05/2015 56  17 - 63 U/L Final  . Alkaline Phosphatase 10/05/2015 78  38 - 126 U/L Final  . Total Bilirubin 10/05/2015 0.5  0.3 - 1.2 mg/dL Final  . GFR calc non Af Amer 10/05/2015 >60  >60 mL/min Final  . GFR calc Af Amer 10/05/2015 >60  >60 mL/min Final   Comment: (NOTE) The eGFR has  been calculated using the CKD EPI equation. This calculation has not been validated in all clinical situations. eGFR's persistently <60 mL/min signify possible Chronic Hatfield Disease.   Georgiann Hahn gap 10/05/2015 7  5 - 15 Final     Assessment:  JAEVEN WANZER is a 55 y.o. 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    He was diagnosed with stage IIIA squamous cell lung cancer in 07/02/2013.  PET scan on 06/23/2013 revealed a 1.5 cm right upper lobe nodule (SUV 7.4) concerning for primary bronchogenic carcinoma.  There were hypermetabolic ipsilateral hilar and mediastinal lymph node metastasis. There was malignant range FDG uptake associated with bilateral level 2 cervical lymph nodes of uncertain significance.  EBUS assisted biopsy of a right lower paratracheal and right hilar lymph node on 07/02/2013 confirmed non-small cell carcinoma, favoring squamous cell carcinoma.  Clinical stage was T1N2M0.  He received concurrent chemotherapy and radiation.  He received 6,000 cGy from 08/05/2014 - 09/14/2013.  He received weekly carboplatin and Taxol x 6 from 08/02/2013 - 10/25/2013.  Course was complicated by a late pneumothorax secondary to port placement.  He also had a seizure in 03/2014 with subsequent fracture of lumbar vertebrae and rod placement.  PET scan on 04/12/2015 revealed mild soft tissue fullness along the posterior right upper lobe/perihilar region, with associated mild vague hypermetabolism (SUV 2.6).  There were hypermetabolic bilateral adrenal metastases, right (2.7 cm; SUV 10) greater than left (2.1 cm; SUV 7.8).  There was small hypermetabolic upper abdominal nodal metastases (9 mm porta hepatitis (SUV 7.3) and 7 mm portacaval node (SUV 5.3)).  CT guided right adrenal biopsy on 04/25/2015 revealed metastatic adenocarcinoma of lung primary.  TTF-1 was positive.  PD-L1 testing was negative (< 1%).  EGFR revealed no mutation.  There was no ALK rearrangement.  ROS1 revealed no result (unsuccessful).  RET gene rearrangement was negative.    CEA was 36.5 on 05/25/2015, 34 on 06/15/2015, and 35.3 on 07/06/2015, 48.8 on 07/27/2015, 62.5 on  08/17/2015, and 115.6 on 09/07/2015.  He has a mild normocytic anemia.  Anemia work-up on 04/05/2015 revealed iron deficiency anemia (ferritin 48, iron saturation 12%, TIBC 310).  Sedimentation rate was 27.  B12 was 255 (low normal) with a folate 13.8 (normal).  MMA was normal (246).  TSH was 8.840 (high) with normal T4 and T3U, and free thyroxine index.   His last colonoscopy was just prior to his diagnosis.  He has never had an EGD.  He denies any melena or hematochezia.  Diet is poor.  He is on oral iron.  Echo on 08/18/2014 revealed an EF of 45%.  Echo at [facility] on 09/19/2015 revealed an EF of 50%.  He received 5 cycles of carboplatin and Alimta  (05/25/2015 - 08/17/2015).  Chest, abdomen, and pelvic CT scan on 08/09/2015 revealed radiation changes in the right upper lobe extending to the perihilar region. There was stable mild soft tissue prominence laterally.  There were stable bilateral adrenal metastases.  There were mildly prominent upper abdominal lymph nodes measuring up to 11 mm.  Bone scan on 08/11/2015 revealed mild multifocal areas of increased activity about the cervical and lumbar spine and right shoulder, most likely degenerative.   PET scan on 09/06/2015 revealed interval progression of nodal metastasis with new upper mediastinal hypermetabolic adenopathy, bilateral hypermetabolic lymph nodes adjacent to the tails of the parotid glands, and increased number of hypermetabolic periaortic lymph nodes.  There was stable to mildly increased bilateral adrenal metastasis.  He received 1 cycle of nivolumab (09/14/2015).  Course was complicated by colitis. Abdominal and pelvic CT scan on 09/19/2015 revealed pancolitis.  Flexible sigmoidoscopy on 09/21/2015 showed patchy nonspecific elevation sparing the rectum, worse in the sigmoid colon and up to the transverse colon.  Diarrhea and hematochezia stopped abruptly with steroids instituted on 09/19/2015.  He is on a prednisone taper (current dose  50 mg a day).  He was admitted to [facility] from 09/17/2015 - 09/26/2015 with an NSTEMI.  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 There was 50% mid LAD and mid RCA lesion.  Chest CT angiogram on 09/21/2015 confirmed bilateral pulmonary emboli. Bilateral lower extremity duplex on 09/22/2015 revealed bilateral femoral DVTs.  An IVC filter was placed.  He is on Lovenox 60 mg every 12 hours.  Symptomatically, he has intermittent chest pain and shortness of breath.   Plan: 1.  Labs today:  CBC with diff, CMP. 2.  Decrease prednisone to 40 mg a day on 10/07/2015 and 30 mg a day on 10/11/2015.  Patient to notify clinic if any diarrhea or blood in stool. 3.  Continue Lovenox. 4.  Handicapped parking permit. 5.  Rx:  prednisone and hydroxyzine. 6.  RTC in 1 week for MD assess and labs (CBC with diff, BMP).   Lequita Asal, MD  10/05/2015, 2:18 PM

## 2015-10-05 NOTE — Progress Notes (Signed)
Patient states he had some nausea this morning but took antiemetic and it relented.  Patient walked in today.  Much improved since last visit.  Saw his cardiologist this morning.  Patient states his knees are hurting him a lot and cardiologist thinks it may be because of blood clots.  Patient requesting refill for  Hydroxyzine HCL 10 mg.

## 2015-10-07 ENCOUNTER — Encounter: Payer: Self-pay | Admitting: Hematology and Oncology

## 2015-10-07 DIAGNOSIS — I82413 Acute embolism and thrombosis of femoral vein, bilateral: Secondary | ICD-10-CM | POA: Insufficient documentation

## 2015-10-07 DIAGNOSIS — K529 Noninfective gastroenteritis and colitis, unspecified: Secondary | ICD-10-CM | POA: Insufficient documentation

## 2015-10-07 DIAGNOSIS — I2699 Other pulmonary embolism without acute cor pulmonale: Secondary | ICD-10-CM | POA: Insufficient documentation

## 2015-10-07 NOTE — Progress Notes (Signed)
DISCONTINUE OFF PATHWAY REGIMEN - Non-Small Cell Lung  Off Pathway: Nivolumab 240 mg q14 Days  [id number]:Nivolumab 240 mg q14 Days:   A cycle is every 14 days:     Nivolumab (Opdivo(R)) 240 mg flat dose in 100 mL NS IV over 60 minutes. Inline filter required (low protein binding) Dose Mod: None Additional Orders: Severe immune-mediated reactions can occur (e.g. pneumonitis, colitis, and hepatitis). See prescribing information for more details including monitoring and required immediate management with steroids. Monitor thyroid, renal, liver  function tests, glucose, and sodium at baseline and periodically during therapy.  **Always confirm dose/schedule in your pharmacy ordering system**    REASON: Toxicities / Adverse Event PRIOR TREATMENT: Off Pathway: Nivolumab 240 mg q14 Days TREATMENT RESPONSE: Unable to Evaluate  START ON PATHWAY REGIMEN - Non-Small Cell Lung  [id number]: Docetaxel 75 mg/m2 q21 Days Until Progression or Unacceptable Toxicity   A cycle is every 21 days:     Docetaxel (Taxotere(R)) 75 mg/m2 in 250 mL NS IV over one hour Dose Mod: None Additional Orders: Premedicate with dexamethasone 8 mg PO BID for three days beginning 1 day prior to therapy  **Always confirm dose/schedule in your pharmacy ordering system**    Patient Characteristics: Stage IV Metastatic, Non Squamous, Third Line - Chemotherapy/Immunotherapy, PS = 2 AJCC M Stage: X AJCC N Stage: X AJCC T Stage: X Current Disease Status: Distant Metastases AJCC Stage Grouping: IV Histology: Non Squamous Cell ROS1 Rearrangement Status: Quantity Not Sufficient T790M Mutation Status: Not Applicable - EGFR Mutation Negative/Unknown Other Mutations/Biomarkers: No Other Actionable Mutations PD-L1 Expression Status: PD-L1 Negative Chemotherapy/Immunotherapy LOT: Third Line Chemotherapy/Immunotherapy Molecular Targeted Therapy: Not Appropriate ALK Translocation Status: Negative Would you be surprised if this  patient died  in the next year? I would be surprised if this patient died in the next year EGFR Mutation Status: Negative/Wild Type Performance Status: PS = 2  Intent of Therapy: Non-Curative / Palliative Intent, Discussed with Patient

## 2015-10-12 ENCOUNTER — Inpatient Hospital Stay: Payer: BLUE CROSS/BLUE SHIELD

## 2015-10-12 ENCOUNTER — Inpatient Hospital Stay (HOSPITAL_BASED_OUTPATIENT_CLINIC_OR_DEPARTMENT_OTHER): Payer: BLUE CROSS/BLUE SHIELD | Admitting: Hematology and Oncology

## 2015-10-12 ENCOUNTER — Other Ambulatory Visit: Payer: Self-pay | Admitting: *Deleted

## 2015-10-12 VITALS — BP 119/83 | HR 111 | Temp 96.3°F | Resp 18 | Wt 137.6 lb

## 2015-10-12 DIAGNOSIS — C7972 Secondary malignant neoplasm of left adrenal gland: Secondary | ICD-10-CM | POA: Diagnosis not present

## 2015-10-12 DIAGNOSIS — I2699 Other pulmonary embolism without acute cor pulmonale: Secondary | ICD-10-CM

## 2015-10-12 DIAGNOSIS — C778 Secondary and unspecified malignant neoplasm of lymph nodes of multiple regions: Secondary | ICD-10-CM

## 2015-10-12 DIAGNOSIS — Z7901 Long term (current) use of anticoagulants: Secondary | ICD-10-CM

## 2015-10-12 DIAGNOSIS — E860 Dehydration: Secondary | ICD-10-CM

## 2015-10-12 DIAGNOSIS — C3411 Malignant neoplasm of upper lobe, right bronchus or lung: Secondary | ICD-10-CM

## 2015-10-12 DIAGNOSIS — R5383 Other fatigue: Secondary | ICD-10-CM

## 2015-10-12 DIAGNOSIS — D509 Iron deficiency anemia, unspecified: Secondary | ICD-10-CM

## 2015-10-12 DIAGNOSIS — C7971 Secondary malignant neoplasm of right adrenal gland: Secondary | ICD-10-CM

## 2015-10-12 DIAGNOSIS — C3490 Malignant neoplasm of unspecified part of unspecified bronchus or lung: Secondary | ICD-10-CM

## 2015-10-12 DIAGNOSIS — C7800 Secondary malignant neoplasm of unspecified lung: Secondary | ICD-10-CM

## 2015-10-12 DIAGNOSIS — G8929 Other chronic pain: Secondary | ICD-10-CM

## 2015-10-12 DIAGNOSIS — Z79899 Other long term (current) drug therapy: Secondary | ICD-10-CM

## 2015-10-12 DIAGNOSIS — C7970 Secondary malignant neoplasm of unspecified adrenal gland: Secondary | ICD-10-CM

## 2015-10-12 DIAGNOSIS — I214 Non-ST elevation (NSTEMI) myocardial infarction: Secondary | ICD-10-CM

## 2015-10-12 DIAGNOSIS — I82413 Acute embolism and thrombosis of femoral vein, bilateral: Secondary | ICD-10-CM

## 2015-10-12 DIAGNOSIS — B029 Zoster without complications: Secondary | ICD-10-CM

## 2015-10-12 DIAGNOSIS — K529 Noninfective gastroenteritis and colitis, unspecified: Secondary | ICD-10-CM

## 2015-10-12 LAB — BASIC METABOLIC PANEL
Anion gap: 8 (ref 5–15)
BUN: 20 mg/dL (ref 6–20)
CO2: 28 mmol/L (ref 22–32)
Calcium: 8.9 mg/dL (ref 8.9–10.3)
Chloride: 97 mmol/L — ABNORMAL LOW (ref 101–111)
Creatinine, Ser: 0.89 mg/dL (ref 0.61–1.24)
GFR calc Af Amer: >60 mL/min (ref >60)
GFR calc non Af Amer: >60 mL/min (ref >60)
Glucose, Bld: 247 mg/dL — ABNORMAL HIGH (ref 65–99)
Potassium: 3.8 mmol/L (ref 3.5–5.1)
Sodium: 133 mmol/L — ABNORMAL LOW (ref 135–145)

## 2015-10-12 LAB — CBC WITH DIFFERENTIAL/PLATELET
Basophils Absolute: 0 10*3/uL (ref 0–0.1)
Basophils Relative: 0 %
Eosinophils Absolute: 0 10*3/uL (ref 0–0.7)
Eosinophils Relative: 0 %
HCT: 34.5 % — ABNORMAL LOW (ref 40.0–52.0)
Hemoglobin: 11.7 g/dL — ABNORMAL LOW (ref 13.0–18.0)
Lymphocytes Relative: 12 %
Lymphs Abs: 0.7 10*3/uL — ABNORMAL LOW (ref 1.0–3.6)
MCH: 31 pg (ref 26.0–34.0)
MCHC: 33.8 g/dL (ref 32.0–36.0)
MCV: 91.7 fL (ref 80.0–100.0)
Monocytes Absolute: 0.3 10*3/uL (ref 0.2–1.0)
Monocytes Relative: 5 %
Neutro Abs: 4.6 10*3/uL (ref 1.4–6.5)
Neutrophils Relative %: 83 %
Platelets: 163 10*3/uL (ref 150–440)
RBC: 3.77 MIL/uL — ABNORMAL LOW (ref 4.40–5.90)
RDW: 14.9 % — ABNORMAL HIGH (ref 11.5–14.5)
WBC: 5.6 10*3/uL (ref 3.8–10.6)

## 2015-10-12 NOTE — Progress Notes (Signed)
Patient states he saw Boykin Reaper, PA @ [facility] for shingles on left abdomen yesterday.  Patient has no complaints today.

## 2015-10-12 NOTE — Progress Notes (Signed)
[facility] day:  10/12/15    Chief Complaint: Darryl Hatfield is a 55 y.o. 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  HPI:  The patient was last seen in the medical oncology clinic on 10/05/2015.  At that time, he noted intermittent chest pain and shortness of breath. He had seen cardiology that morning.  He was taking prednisone 50 mg a day.   He was to decrease his prednisone to 40 mg a day on 10/07/2015 and 30 mg a day on 10/11/2015 if he had no diarrhea or abdominal symptoms.  He was to notify clinic if he had any diarrhea or blood in stool.  He states that he was diagnosed with shingles during the interim.  He states that the rash burns if his clothes touch his skin.  He has been on valacyclovir for 2 days.  He denies any fever.  He decreased his prednisone from 40 mg a day to 30 mg a day yesterday.  He had one episode of loose stool yesterday.  He has had no diarrhea today.  He denies any change in his diet.  He denies any blood in his stool.   Past Medical History:  Diagnosis Date  . CHF (congestive heart failure) ([place])   . COPD (chronic obstructive pulmonary disease) ([place])   . Hypothyroidism   . Lung cancer ([place]) 2015   RIght   . Seizures ([place])     Past Surgical History:  Procedure Laterality Date  . BACK SURGERY    . PORTACATH PLACEMENT Right     Family History  Problem Relation Age of Onset  . Hypertension Other   . Heart failure Other   . Cancer Father     Social History:  reports that he quit smoking about 2 years ago. He has a 40.00 pack-year smoking history. He has never used smokeless tobacco. He reports that he does not drink alcohol or use drugs.  He is on disability.  He is accompanied by his wife, [name].  She is having surgery on 10/14/2015.  Allergies:  Allergies  Allergen  Reactions  . No Known Allergies     Current Medications: Current Outpatient Prescriptions  Medication Sig Dispense Refill  . acetaminophen (TYLENOL) 325 MG tablet Take 650 mg by mouth every 6 (six) hours as needed for mild pain.     Marland Kitchen albuterol (PROVENTIL) (2.5 MG/3ML) 0.083% nebulizer solution Take 3 mLs (2.5 mg total) by nebulization every 6 (six) hours as needed for wheezing or shortness of breath. (Patient taking differently: Take 2.5 mg by nebulization 2 (two) times daily. ) 75 mL 2  . aluminum-magnesium hydroxide-simethicone (MAALOX) 242-683-41 MG/5ML SUSP Take 30 mLs by mouth 4 (four) times daily -  before meals and at bedtime. 355 mL 0  . aspirin 81 MG EC tablet   0  . atorvastatin (LIPITOR) 80 MG tablet Take 80 mg by mouth daily.     . Calcium Carbonate-Vitamin D 600-400 MG-UNIT tablet Take 1 tablet by mouth 2 (two) times daily.     Marland Kitchen dexamethasone (DECADRON) 4 MG tablet TAKE 1 TAB 2 TIMES A DAY BEFORE CHEMO. TAKE 2 TABS TWO TIMES A DAY STARTING THE DAY AFTER CHEMO  1  . DULoxetine (CYMBALTA) 30 MG capsule Take 30 mg by mouth daily.    Marland Kitchen enoxaparin (LOVENOX)  60 MG/0.6ML injection Inject 0.6 mLs (60 mg total) into the skin every 12 (twelve) hours. 60 Syringe 0  . folic acid (FOLVITE) 1 MG tablet TAKE 1 TABLET BY MOUTH DAILY START 5-7 DAYS BEFORE ALIMTA CHEMO, CONTINUE UNTIL 21 DAYS AFTER ALIMTA 100 tablet 3  . gabapentin (NEURONTIN) 100 MG capsule Take 1 capsule (100 mg total) by mouth 3 (three) times daily. 90 capsule 4  . hydrOXYzine (ATARAX/VISTARIL) 10 MG tablet Take 1 tablet (10 mg total) by mouth 3 (three) times daily as needed for itching. 60 tablet 1  . levothyroxine (SYNTHROID, LEVOTHROID) 88 MCG tablet Take 1 tablet (88 mcg total) by mouth daily before breakfast. (Patient taking differently: Take 44 mcg by mouth daily before breakfast. ) 30 tablet 0  . LORazepam (ATIVAN) 0.5 MG tablet Take 1 tablet (0.5 mg total) by mouth every 8 (eight) hours as needed for anxiety. 30 tablet 0   . metoCLOPramide (REGLAN) 10 MG tablet Take 1 tablet (10 mg total) by mouth every 6 (six) hours as needed. 30 tablet 0  . metoprolol succinate (TOPROL-XL) 25 MG 24 hr tablet   2  . nitroGLYCERIN (NITROSTAT) 0.4 MG SL tablet Place 0.4 mg under the tongue every 5 (five) minutes as needed.   0  . ondansetron (ZOFRAN) 8 MG tablet Take 1 tablet (8 mg total) by mouth every 8 (eight) hours as needed for nausea or vomiting. 30 tablet 3  . pantoprazole (PROTONIX) 40 MG tablet Take by mouth.    . predniSONE (DELTASONE) 20 MG tablet Take 2 tablets (40 mg total) by mouth daily with breakfast. Taper as direct by MD 30 tablet 0  . QUEtiapine (SEROQUEL) 50 MG tablet Take 50 mg by mouth daily.     Marland Kitchen tiotropium (SPIRIVA) 18 MCG inhalation capsule Place 18 mcg into inhaler and inhale daily.    . simethicone (MYLICON) 80 MG chewable tablet Chew 80 mg by mouth daily.      No current facility-administered medications for this visit.    Facility-Administered Medications Ordered in Other Visits  Medication Dose Route Frequency Provider Last Rate Last Dose  . sodium chloride 0.9 % injection 10 mL  10 mL Intracatheter PRN Forest Gleason, MD   10 mL at 06/27/14 1338  . sodium chloride 0.9 % injection 10 mL  10 mL Intracatheter PRN Leia Alf, MD   10 mL at 08/22/14 0851  . sodium chloride flush (NS) 0.9 % injection 10 mL  10 mL Intravenous PRN Lequita Asal, MD        Review of Systems:  GENERAL:  Feels "ok".  No fever or sweats.  Weight down 4 pounds. PERFORMANCE STATUS (ECOG):  2 HEENT:  No visual changes, runny nose, sore throat, mouth sores or tenderness. Lungs: Shortness of breath with exertion.  No cough.  No hemoptysis. Cardiac:  No chest pain, palpitations, orthopnea, or PND. GI:  Loose stool x 1 yesterday, none today.  No nausea, vomiting, diarrhea, melena or hematochezia.  GU:  No urgency, frequency, dysuria, or hematuria. Musculoskeletal:  Chronic back pain.  Knee pain.  No muscle  tenderness. Extremities:  No swelling or pain. Skin:  Shingles left abdominal wall.  No rashes or skin changes. Neuro:  No headache, numbness or weakness, balance or coordination issues. Endocrine:  No diabetes. Thyroid disease on Synthroid.  No hot flashes or night sweats. Psych:  High anxiety (stable).  No mood changes or depression. Pain:  No focal pain. Review of systems:  All other systems  reviewed and found to be negative.  Physical Exam: Blood pressure 119/83, pulse (!) 111, temperature (!) 96.3 F (35.7 C), temperature source Tympanic, resp. rate 18, weight 137 lb 9.1 oz (62.4 kg). GENERAL:  Thin gentleman sitting comfortably in the exam room in no acute distress.  MENTAL STATUS:  Alert and oriented to person, place and time. HEAD:  Black hair with slight graying.  Mustache.  Face full s/p steroids.  Normocephalic, atraumatic, face symmetric, no Cushingoid features. EYES:  Glasses.  Blue eyes.  Pupils equal round and reactive to light and accomodation.  No conjunctivitis or scleral icterus. ENT:  Oropharynx clear without lesion.  Dentures.  Tongue normal. Mucous membranes moist.  RESPIRATORY:  Clear to auscultation without rales, wheezes or rhonchi. CARDIOVASCULAR:  Regular rate and rhythm without murmur, rub or gallop.  No JVD. ABDOMEN:  Soft, non-tender with active bowel sounds and no hepatosplenomegaly.  No masses. SKIN:  Abdominal bruises s/p Lovenox.  Small cluster of lesions with tiny eschars left side of abdomen.   EXTREMITIES:  No lower extremity edema.  No skin discoloration or tenderness.  No palpable cords. LYMPH NODES: No palpable cervical, supraclavicular, axillary or inguinal adenopathy.  NEUROLOGICAL: Unremarkable. PSYCH:  Anxious.    Appointment on 10/12/2015  Component Date Value Ref Range Status  . WBC 10/12/2015 5.6  3.8 - 10.6 K/uL Final  . RBC 10/12/2015 3.77* 4.40 - 5.90 MIL/uL Final  . Hemoglobin 10/12/2015 11.7* 13.0 - 18.0 g/dL Final  . HCT 10/12/2015  34.5* 40.0 - 52.0 % Final  . MCV 10/12/2015 91.7  80.0 - 100.0 fL Final  . MCH 10/12/2015 31.0  26.0 - 34.0 pg Final  . MCHC 10/12/2015 33.8  32.0 - 36.0 g/dL Final  . RDW 10/12/2015 14.9* 11.5 - 14.5 % Final  . Platelets 10/12/2015 163  150 - 440 K/uL Final  . Neutrophils Relative % 10/12/2015 83  % Final  . Neutro Abs 10/12/2015 4.6  1.4 - 6.5 K/uL Final  . Lymphocytes Relative 10/12/2015 12  % Final  . Lymphs Abs 10/12/2015 0.7* 1.0 - 3.6 K/uL Final  . Monocytes Relative 10/12/2015 5  % Final  . Monocytes Absolute 10/12/2015 0.3  0.2 - 1.0 K/uL Final  . Eosinophils Relative 10/12/2015 0  % Final  . Eosinophils Absolute 10/12/2015 0.0  0 - 0.7 K/uL Final  . Basophils Relative 10/12/2015 0  % Final  . Basophils Absolute 10/12/2015 0.0  0 - 0.1 K/uL Final  . Sodium 10/12/2015 133* 135 - 145 mmol/L Final  . Potassium 10/12/2015 3.8  3.5 - 5.1 mmol/L Final  . Chloride 10/12/2015 97* 101 - 111 mmol/L Final  . CO2 10/12/2015 28  22 - 32 mmol/L Final  . Glucose, Bld 10/12/2015 247* 65 - 99 mg/dL Final  . BUN 10/12/2015 20  6 - 20 mg/dL Final  . Creatinine, Ser 10/12/2015 0.89  0.61 - 1.24 mg/dL Final  . Calcium 10/12/2015 8.9  8.9 - 10.3 mg/dL Final  . GFR calc non Af Amer 10/12/2015 >60  >60 mL/min Final  . GFR calc Af Amer 10/12/2015 >60  >60 mL/min Final   Comment: (NOTE) The eGFR has been calculated using the CKD EPI equation. This calculation has not been validated in all clinical situations. eGFR's persistently <60 mL/min signify possible Chronic Kidney Disease.   Georgiann Hahn gap 10/12/2015 8  5 - 15 Final    Assessment:  Darryl Hatfield is a 55 y.o. male with a history of stage IIIA squamous  cell carcinoma of the right lung and metastatic adenocarcinoma of the lung.    He was diagnosed with stage IIIA squamous cell lung cancer in 07/02/2013.  PET scan on 06/23/2013 revealed a 1.5 cm right upper lobe nodule (SUV 7.4) concerning for primary bronchogenic carcinoma.  There were  hypermetabolic ipsilateral hilar and mediastinal lymph node metastasis. There was malignant range FDG uptake associated with bilateral level 2 cervical lymph nodes of uncertain significance.  EBUS assisted biopsy of a right lower paratracheal and right hilar lymph node on 07/02/2013 confirmed non-small cell carcinoma, favoring squamous cell carcinoma.  Clinical stage was T1N2M0.  He received concurrent chemotherapy and radiation.  He received 6,000 cGy from 08/05/2014 - 09/14/2013.  He received weekly carboplatin and Taxol x 6 from 08/02/2013 - 10/25/2013.  Course was complicated by a late pneumothorax secondary to port placement.  He also had a seizure in 03/2014 with subsequent fracture of lumbar vertebrae and rod placement.  PET scan on 04/12/2015 revealed mild soft tissue fullness along the posterior right upper lobe/perihilar region, with associated mild vague hypermetabolism (SUV 2.6).  There were hypermetabolic bilateral adrenal metastases, right (2.7 cm; SUV 10) greater than left (2.1 cm; SUV 7.8).  There was small hypermetabolic upper abdominal nodal metastases (9 mm porta hepatitis (SUV 7.3) and 7 mm portacaval node (SUV 5.3)).  CT guided right adrenal biopsy on 04/25/2015 revealed metastatic adenocarcinoma of lung primary.  TTF-1 was positive.  PD-L1 testing was negative (< 1%).  EGFR revealed no mutation.  There was no ALK rearrangement.  ROS1 revealed no result (unsuccessful).  RET gene rearrangement was negative.    CEA was 36.5 on 05/25/2015, 34 on 06/15/2015, and 35.3 on 07/06/2015, 48.8 on 07/27/2015, 62.5 on 08/17/2015, and 115.6 on 09/07/2015.  He has a mild normocytic anemia.  Anemia work-up on 04/05/2015 revealed iron deficiency anemia (ferritin 48, iron saturation 12%, TIBC 310).  Sedimentation rate was 27.  B12 was 255 (low normal) with a folate 13.8 (normal).  MMA was normal (246).  TSH was 8.840 (high) with normal T4 and T3U, and free thyroxine index.   His last colonoscopy  was just prior to his diagnosis.  He has never had an EGD.  He denies any melena or hematochezia.  Diet is poor.  He is on oral iron.  Echo on 08/18/2014 revealed an EF of 45%.  Echo at [facility] on 09/19/2015 revealed an EF of 50%.  He received 5 cycles of carboplatin and Alimta  (05/25/2015 - 08/17/2015).  Chest, abdomen, and pelvic CT scan on 08/09/2015 revealed radiation changes in the right upper lobe extending to the perihilar region. There was stable mild soft tissue prominence laterally.  There were stable bilateral adrenal metastases.  There were mildly prominent upper abdominal lymph nodes measuring up to 11 mm.  Bone scan on 08/11/2015 revealed mild multifocal areas of increased activity about the cervical and lumbar spine and right shoulder, most likely degenerative.   PET scan on 09/06/2015 revealed interval progression of nodal metastasis with new upper mediastinal hypermetabolic adenopathy, bilateral hypermetabolic lymph nodes adjacent to the tails of the parotid glands, and increased number of hypermetabolic periaortic lymph nodes.  There was stable to mildly increased bilateral adrenal metastasis.  He received 1 cycle of nivolumab (09/14/2015).  Course was complicated by colitis. Abdominal and pelvic CT scan on 09/19/2015 revealed pancolitis.  Flexible sigmoidoscopy on 09/21/2015 showed patchy nonspecific elevation sparing the rectum, worse in the sigmoid colon and up to the transverse colon.  Diarrhea and  hematochezia stopped abruptly with steroids instituted on 09/19/2015.  He is on a prednisone taper (current dose 30 mg a day).  He was admitted to [facility] from 09/17/2015 - 09/26/2015 with an NSTEMI.  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 There was 50% mid LAD and mid RCA lesion.  Chest CT angiogram on 09/21/2015 confirmed bilateral pulmonary emboli. Bilateral lower extremity duplex on 09/22/2015 revealed bilateral femoral  DVTs.  An IVC filter was placed.  He is on Lovenox 60 mg every 12 hours.  He was diagnosed with varicella zoster on 10/10/2015.  He is on valacyclovir.  Lesions are crusting.  Symptomatically, he had 1 episode of diarrhea yesterday.  He denies any hematochezia.  He is on valacyclovir for shingles.  Plan: 1.  Labs today:  CBC with diff, BMP. 2.  Continue prednisone 30 mg a day.  Decrease to 20 mg a day on 09/25 if no recurrent diarrhea. 3.  Continue Lovenox. 4.  Continue valacyclovir. 5.  Discuss plans for reinitiation of chemotherapy once off steroids and back to baseline health. 6.  RTC in 1 week for MD assess, labs (CBC with diff, BMP).   Lequita Asal, MD  10/12/2015, 11:35 AM

## 2015-10-13 ENCOUNTER — Other Ambulatory Visit: Payer: Self-pay

## 2015-10-13 ENCOUNTER — Emergency Department: Payer: BLUE CROSS/BLUE SHIELD

## 2015-10-13 ENCOUNTER — Inpatient Hospital Stay (HOSPITAL_BASED_OUTPATIENT_CLINIC_OR_DEPARTMENT_OTHER): Payer: BLUE CROSS/BLUE SHIELD | Admitting: Hematology and Oncology

## 2015-10-13 ENCOUNTER — Encounter: Payer: Self-pay | Admitting: Emergency Medicine

## 2015-10-13 ENCOUNTER — Emergency Department
Admission: EM | Admit: 2015-10-13 | Discharge: 2015-10-13 | Payer: BLUE CROSS/BLUE SHIELD | Attending: Emergency Medicine | Admitting: Emergency Medicine

## 2015-10-13 ENCOUNTER — Telehealth: Payer: Self-pay | Admitting: *Deleted

## 2015-10-13 ENCOUNTER — Encounter: Payer: Self-pay | Admitting: Hematology and Oncology

## 2015-10-13 ENCOUNTER — Ambulatory Visit: Payer: BLUE CROSS/BLUE SHIELD

## 2015-10-13 ENCOUNTER — Other Ambulatory Visit: Payer: Self-pay | Admitting: *Deleted

## 2015-10-13 VITALS — BP 127/87 | HR 137 | Temp 100.4°F

## 2015-10-13 DIAGNOSIS — R509 Fever, unspecified: Secondary | ICD-10-CM

## 2015-10-13 DIAGNOSIS — Z79899 Other long term (current) drug therapy: Secondary | ICD-10-CM | POA: Insufficient documentation

## 2015-10-13 DIAGNOSIS — K529 Noninfective gastroenteritis and colitis, unspecified: Secondary | ICD-10-CM

## 2015-10-13 DIAGNOSIS — A419 Sepsis, unspecified organism: Secondary | ICD-10-CM | POA: Diagnosis not present

## 2015-10-13 DIAGNOSIS — C7800 Secondary malignant neoplasm of unspecified lung: Secondary | ICD-10-CM

## 2015-10-13 DIAGNOSIS — I252 Old myocardial infarction: Secondary | ICD-10-CM | POA: Diagnosis not present

## 2015-10-13 DIAGNOSIS — C7971 Secondary malignant neoplasm of right adrenal gland: Secondary | ICD-10-CM

## 2015-10-13 DIAGNOSIS — C3411 Malignant neoplasm of upper lobe, right bronchus or lung: Secondary | ICD-10-CM | POA: Diagnosis not present

## 2015-10-13 DIAGNOSIS — I509 Heart failure, unspecified: Secondary | ICD-10-CM | POA: Insufficient documentation

## 2015-10-13 DIAGNOSIS — C7972 Secondary malignant neoplasm of left adrenal gland: Secondary | ICD-10-CM | POA: Diagnosis not present

## 2015-10-13 DIAGNOSIS — C778 Secondary and unspecified malignant neoplasm of lymph nodes of multiple regions: Secondary | ICD-10-CM

## 2015-10-13 DIAGNOSIS — E860 Dehydration: Secondary | ICD-10-CM

## 2015-10-13 DIAGNOSIS — Z87891 Personal history of nicotine dependence: Secondary | ICD-10-CM | POA: Insufficient documentation

## 2015-10-13 DIAGNOSIS — E039 Hypothyroidism, unspecified: Secondary | ICD-10-CM | POA: Insufficient documentation

## 2015-10-13 DIAGNOSIS — Z7982 Long term (current) use of aspirin: Secondary | ICD-10-CM | POA: Diagnosis not present

## 2015-10-13 DIAGNOSIS — D899 Disorder involving the immune mechanism, unspecified: Secondary | ICD-10-CM | POA: Insufficient documentation

## 2015-10-13 DIAGNOSIS — R05 Cough: Secondary | ICD-10-CM

## 2015-10-13 DIAGNOSIS — Z85118 Personal history of other malignant neoplasm of bronchus and lung: Secondary | ICD-10-CM | POA: Diagnosis not present

## 2015-10-13 DIAGNOSIS — B029 Zoster without complications: Secondary | ICD-10-CM | POA: Diagnosis not present

## 2015-10-13 DIAGNOSIS — G8929 Other chronic pain: Secondary | ICD-10-CM

## 2015-10-13 DIAGNOSIS — R109 Unspecified abdominal pain: Secondary | ICD-10-CM

## 2015-10-13 DIAGNOSIS — Z7901 Long term (current) use of anticoagulants: Secondary | ICD-10-CM

## 2015-10-13 DIAGNOSIS — R059 Cough, unspecified: Secondary | ICD-10-CM

## 2015-10-13 DIAGNOSIS — C3490 Malignant neoplasm of unspecified part of unspecified bronchus or lung: Secondary | ICD-10-CM

## 2015-10-13 DIAGNOSIS — I82413 Acute embolism and thrombosis of femoral vein, bilateral: Secondary | ICD-10-CM

## 2015-10-13 DIAGNOSIS — J189 Pneumonia, unspecified organism: Secondary | ICD-10-CM | POA: Insufficient documentation

## 2015-10-13 DIAGNOSIS — D849 Immunodeficiency, unspecified: Secondary | ICD-10-CM

## 2015-10-13 DIAGNOSIS — J449 Chronic obstructive pulmonary disease, unspecified: Secondary | ICD-10-CM | POA: Insufficient documentation

## 2015-10-13 DIAGNOSIS — R197 Diarrhea, unspecified: Secondary | ICD-10-CM

## 2015-10-13 DIAGNOSIS — B019 Varicella without complication: Secondary | ICD-10-CM

## 2015-10-13 DIAGNOSIS — I214 Non-ST elevation (NSTEMI) myocardial infarction: Secondary | ICD-10-CM

## 2015-10-13 DIAGNOSIS — D509 Iron deficiency anemia, unspecified: Secondary | ICD-10-CM

## 2015-10-13 DIAGNOSIS — R5383 Other fatigue: Secondary | ICD-10-CM

## 2015-10-13 DIAGNOSIS — I2699 Other pulmonary embolism without acute cor pulmonale: Secondary | ICD-10-CM

## 2015-10-13 LAB — COMPREHENSIVE METABOLIC PANEL
ALT: 39 U/L (ref 17–63)
AST: 27 U/L (ref 15–41)
Albumin: 3.4 g/dL — ABNORMAL LOW (ref 3.5–5.0)
Alkaline Phosphatase: 91 U/L (ref 38–126)
Anion gap: 8 (ref 5–15)
BUN: 18 mg/dL (ref 6–20)
CO2: 31 mmol/L (ref 22–32)
Calcium: 8.7 mg/dL — ABNORMAL LOW (ref 8.9–10.3)
Chloride: 94 mmol/L — ABNORMAL LOW (ref 101–111)
Creatinine, Ser: 0.98 mg/dL (ref 0.61–1.24)
GFR calc Af Amer: >60 mL/min (ref >60)
GFR calc non Af Amer: >60 mL/min (ref >60)
Glucose, Bld: 144 mg/dL — ABNORMAL HIGH (ref 65–99)
Potassium: 3.5 mmol/L (ref 3.5–5.1)
Sodium: 133 mmol/L — ABNORMAL LOW (ref 135–145)
Total Bilirubin: 0.5 mg/dL (ref 0.3–1.2)
Total Protein: 7 g/dL (ref 6.5–8.1)

## 2015-10-13 LAB — CBC WITH DIFFERENTIAL/PLATELET
Basophils Absolute: 0 10*3/uL (ref 0–0.1)
Basophils Relative: 1 %
Eosinophils Absolute: 0.1 10*3/uL (ref 0–0.7)
Eosinophils Relative: 2 %
HCT: 36.9 % — ABNORMAL LOW (ref 40.0–52.0)
Hemoglobin: 12.4 g/dL — ABNORMAL LOW (ref 13.0–18.0)
Lymphocytes Relative: 20 %
Lymphs Abs: 1.2 10*3/uL (ref 1.0–3.6)
MCH: 30.5 pg (ref 26.0–34.0)
MCHC: 33.5 g/dL (ref 32.0–36.0)
MCV: 90.9 fL (ref 80.0–100.0)
Monocytes Absolute: 0.5 10*3/uL (ref 0.2–1.0)
Monocytes Relative: 9 %
Neutro Abs: 4.2 10*3/uL (ref 1.4–6.5)
Neutrophils Relative %: 68 %
Platelets: 178 10*3/uL (ref 150–440)
RBC: 4.06 MIL/uL — ABNORMAL LOW (ref 4.40–5.90)
RDW: 14.9 % — ABNORMAL HIGH (ref 11.5–14.5)
WBC: 6.1 10*3/uL (ref 3.8–10.6)

## 2015-10-13 LAB — PROTIME-INR
INR: 0.98
PROTHROMBIN TIME: 13 s (ref 11.4–15.2)

## 2015-10-13 LAB — LIPASE, BLOOD: Lipase: 17 U/L (ref 11–51)

## 2015-10-13 LAB — TROPONIN I: Troponin I: <0.03 ng/mL (ref <0.03)

## 2015-10-13 LAB — LACTIC ACID, PLASMA
Lactic Acid, Venous: 1.9 mmol/L (ref 0.5–1.9)
Lactic Acid, Venous: 1.3 mmol/L (ref 0.5–1.9)

## 2015-10-13 MED ORDER — VANCOMYCIN HCL IN DEXTROSE 1-5 GM/200ML-% IV SOLN
1000.0000 mg | Freq: Once | INTRAVENOUS | Status: AC
  Administered 2015-10-13: 1000 mg via INTRAVENOUS
  Filled 2015-10-13: qty 200

## 2015-10-13 MED ORDER — MORPHINE SULFATE (PF) 4 MG/ML IV SOLN
4.0000 mg | Freq: Once | INTRAVENOUS | Status: AC
  Administered 2015-10-13: 4 mg via INTRAVENOUS

## 2015-10-13 MED ORDER — ACETAMINOPHEN 325 MG PO TABS
650.0000 mg | ORAL_TABLET | Freq: Once | ORAL | Status: AC
  Administered 2015-10-13: 650 mg via ORAL

## 2015-10-13 MED ORDER — ACETAMINOPHEN 325 MG PO TABS
ORAL_TABLET | ORAL | Status: AC
  Filled 2015-10-13: qty 2

## 2015-10-13 MED ORDER — VANCOMYCIN HCL IN DEXTROSE 1-5 GM/200ML-% IV SOLN: 1000.0000 mg | Freq: Two times a day (BID) | INTRAVENOUS | Status: DC

## 2015-10-13 MED ORDER — PIPERACILLIN-TAZOBACTAM 3.375 G IVPB: 3.3750 g | Freq: Three times a day (TID) | INTRAVENOUS | Status: DC

## 2015-10-13 MED ORDER — DEXTROSE 5 % IV SOLN
10.0000 mg/kg | Freq: Once | INTRAVENOUS | Status: AC
  Administered 2015-10-13: 625 mg via INTRAVENOUS
  Filled 2015-10-13: qty 12.5

## 2015-10-13 MED ORDER — DEXTROSE 5 % IV SOLN: 5.0000 mg/kg | Freq: Once | INTRAVENOUS | Status: DC

## 2015-10-13 MED ORDER — PIPERACILLIN-TAZOBACTAM 3.375 G IVPB 30 MIN
3.3750 g | Freq: Once | INTRAVENOUS | Status: AC
  Administered 2015-10-13: 3.375 g via INTRAVENOUS
  Filled 2015-10-13: qty 50

## 2015-10-13 MED ORDER — MORPHINE SULFATE (PF) 4 MG/ML IV SOLN
INTRAVENOUS | Status: AC
  Administered 2015-10-13: 4 mg via INTRAVENOUS
  Filled 2015-10-13: qty 1

## 2015-10-13 NOTE — ED Notes (Signed)
Called [facility] to let them know [facility] was here and they would be departing to [facility] in about 5 min.

## 2015-10-13 NOTE — Progress Notes (Signed)
[facility] day:  10/13/15    Chief Complaint: Darryl Hatfield is a 55 y.o. 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  HPI:  The patient was last seen in the medical oncology clinic on 10/12/2015.  At that time, he was doing well.  He had decreased his prednisone from 40 mg a day to 30 mg a day on 10/11/2015.  He had one loose stool on 10/11/2015.  He was also noted to be on valacyclovir for varicella zoster on his abdomen.  He notes 3 episodes of diarrhea this morning and 2 later today.  He has been fatigued and in bed.  He has eaten very little.  He notes abdominal cramping, but no blood.  His back hurts. Initial temperature was 99.6.  He notes headache and chills.  He has been coughing up yellow/green sputum.  He has not been drinking much fluids.  He has continued his valacyclovir.  He did not take his steroids today.   Past Medical History:  Diagnosis Date  . CHF (congestive heart failure) ([place])   . COPD (chronic obstructive pulmonary disease) ([place])   . Hypothyroidism   . Lung cancer ([place]) 2015   RIght   . Seizures ([place])     Past Surgical History:  Procedure Laterality Date  . BACK SURGERY    . PORTACATH PLACEMENT Right     Family History  Problem Relation Age of Onset  . Hypertension Other   . Heart failure Other   . Cancer Father     Social History:  reports that he quit smoking about 2 years ago. He has a 40.00 pack-year smoking history. He has never used smokeless tobacco. He reports that he does not drink alcohol or use drugs.  He is on disability.  He is accompanied by his wife, [name].  Allergies:  Allergies  Allergen Reactions  . No Known Allergies     Current Medications: Current Outpatient Prescriptions  Medication Sig Dispense Refill  . acetaminophen (TYLENOL) 325 MG tablet Take 650 mg by mouth every 6 (six) hours as  needed for mild pain.     Marland Kitchen albuterol (PROVENTIL) (2.5 MG/3ML) 0.083% nebulizer solution Take 3 mLs (2.5 mg total) by nebulization every 6 (six) hours as needed for wheezing or shortness of breath. (Patient taking differently: Take 2.5 mg by nebulization 2 (two) times daily. ) 75 mL 2  . aluminum-magnesium hydroxide-simethicone (MAALOX) 176-160-73 MG/5ML SUSP Take 30 mLs by mouth 4 (four) times daily -  before meals and at bedtime. 355 mL 0  . aspirin 81 MG EC tablet   0  . atorvastatin (LIPITOR) 80 MG tablet Take 80 mg by mouth daily.     . Calcium Carbonate-Vitamin D 600-400 MG-UNIT tablet Take 1 tablet by mouth 2 (two) times daily.     Marland Kitchen dexamethasone (DECADRON) 4 MG tablet TAKE 1 TAB 2 TIMES A DAY BEFORE CHEMO. TAKE 2 TABS TWO TIMES A DAY STARTING THE DAY AFTER CHEMO  1  . DULoxetine (CYMBALTA) 30 MG capsule Take 30 mg by mouth daily.    Marland Kitchen enoxaparin (LOVENOX) 60 MG/0.6ML injection Inject 0.6 mLs (60 mg total) into the skin every 12 (twelve) hours. 60 Syringe 0  . folic acid (FOLVITE) 1 MG tablet TAKE 1 TABLET BY MOUTH DAILY START 5-7 DAYS BEFORE ALIMTA CHEMO, CONTINUE UNTIL 21 DAYS AFTER ALIMTA 100  tablet 3  . gabapentin (NEURONTIN) 100 MG capsule Take 1 capsule (100 mg total) by mouth 3 (three) times daily. 90 capsule 4  . hydrOXYzine (ATARAX/VISTARIL) 10 MG tablet Take 1 tablet (10 mg total) by mouth 3 (three) times daily as needed for itching. 60 tablet 1  . levothyroxine (SYNTHROID, LEVOTHROID) 88 MCG tablet Take 1 tablet (88 mcg total) by mouth daily before breakfast. (Patient taking differently: Take 44 mcg by mouth daily before breakfast. ) 30 tablet 0  . LORazepam (ATIVAN) 0.5 MG tablet Take 1 tablet (0.5 mg total) by mouth every 8 (eight) hours as needed for anxiety. 30 tablet 0  . metoCLOPramide (REGLAN) 10 MG tablet Take 1 tablet (10 mg total) by mouth every 6 (six) hours as needed. 30 tablet 0  . metoprolol succinate (TOPROL-XL) 25 MG 24 hr tablet   2  . nitroGLYCERIN (NITROSTAT) 0.4  MG SL tablet Place 0.4 mg under the tongue every 5 (five) minutes as needed.   0  . ondansetron (ZOFRAN) 8 MG tablet Take 1 tablet (8 mg total) by mouth every 8 (eight) hours as needed for nausea or vomiting. 30 tablet 3  . pantoprazole (PROTONIX) 40 MG tablet Take by mouth.    . predniSONE (DELTASONE) 20 MG tablet Take 2 tablets (40 mg total) by mouth daily with breakfast. Taper as direct by MD 30 tablet 0  . QUEtiapine (SEROQUEL) 50 MG tablet Take 50 mg by mouth daily.     Marland Kitchen tiotropium (SPIRIVA) 18 MCG inhalation capsule Place 18 mcg into inhaler and inhale daily.    . simethicone (MYLICON) 80 MG chewable tablet Chew 80 mg by mouth daily.      No current facility-administered medications for this visit.    Facility-Administered Medications Ordered in Other Visits  Medication Dose Route Frequency Provider Last Rate Last Dose  . sodium chloride 0.9 % injection 10 mL  10 mL Intracatheter PRN Forest Gleason, MD   10 mL at 06/27/14 1338  . sodium chloride 0.9 % injection 10 mL  10 mL Intracatheter PRN Leia Alf, MD   10 mL at 08/22/14 0851  . sodium chloride flush (NS) 0.9 % injection 10 mL  10 mL Intravenous PRN Lequita Asal, MD        Review of Systems:  GENERAL:  Feels bad.  Fever and chills at home.  No sweats. PERFORMANCE STATUS (ECOG):  2 HEENT:  No visual changes, runny nose, sore throat, mouth sores or tenderness. Lungs:  Cough productive of yellow/green sputum.  No shortness of breath.  No hemoptysis. Cardiac:  No chest pain, palpitations, orthopnea, or PND. GI:  5 episodes of diarrhea.  Abdominal cramping.  No nausea, vomiting, melena or hematochezia.  GU:  No urgency, frequency, dysuria, or hematuria. Musculoskeletal:  Back pain.  Knee pain.  No muscle tenderness. Extremities:  No swelling or pain. Skin:  Rash associated with shingles, worse. Neuro:  Headache.  No numbness or weakness, balance or coordination issues. Endocrine:  No diabetes. Thyroid disease on Synthroid.   No hot flashes or night sweats. Psych:  High anxiety (stable).  No mood changes or depression. Pain:  No focal pain. Review of systems:  All other systems reviewed and found to be negative.  Physical Exam: Blood pressure 127/87, pulse (!) 137, temperature (!) 100.4 F (38 C), temperature source Oral. GENERAL:  Thin gentleman lying on the exam table in the exam room in mild distress.  MENTAL STATUS:  Alert and oriented to person, place  and time. HEAD:  Black hair with slight graying.  Mustache. Normocephalic, atraumatic, face symmetric, no Cushingoid features. EYES:  Glasses.  Blue eyes.  Pupils equal round and reactive to light and accomodation.  No conjunctivitis or scleral icterus. ENT:  Oropharynx clear without lesion.  Dentures.  Tongue normal. Mucous membranes moist.  RESPIRATORY:  Clear to auscultation without rales, wheezes or rhonchi. CARDIOVASCULAR:  Regular rate and rhythm without murmur, rub or gallop. ABDOMEN:  Soft, slightly tender without guarding or rebound tenderness.  Active bowel sounds and no hepatosplenomegaly.  No masses. SKIN:  Abdominal bruises s/p Lovenox.  Clusted lesions on left side of abdominal wall with surrounding 3.8 x 5 cm halo of erythema. EXTREMITIES:  No lower extremity edema.  No skin discoloration or tenderness.  No palpable cords. LYMPH NODES: No palpable cervical, supraclavicular, axillary or inguinal adenopathy.  NEUROLOGICAL: Unremarkable. PSYCH:  Anxious.    Orders Only on 10/13/2015  Component Date Value Ref Range Status  . WBC 10/13/2015 6.1  3.8 - 10.6 K/uL Final  . RBC 10/13/2015 4.06* 4.40 - 5.90 MIL/uL Final  . Hemoglobin 10/13/2015 12.4* 13.0 - 18.0 g/dL Final  . HCT 10/13/2015 36.9* 40.0 - 52.0 % Final  . MCV 10/13/2015 90.9  80.0 - 100.0 fL Final  . MCH 10/13/2015 30.5  26.0 - 34.0 pg Final  . MCHC 10/13/2015 33.5  32.0 - 36.0 g/dL Final  . RDW 10/13/2015 14.9* 11.5 - 14.5 % Final  . Platelets 10/13/2015 178  150 - 440 K/uL Final   . Neutrophils Relative % 10/13/2015 68  % Final  . Neutro Abs 10/13/2015 4.2  1.4 - 6.5 K/uL Final  . Lymphocytes Relative 10/13/2015 20  % Final  . Lymphs Abs 10/13/2015 1.2  1.0 - 3.6 K/uL Final  . Monocytes Relative 10/13/2015 9  % Final  . Monocytes Absolute 10/13/2015 0.5  0.2 - 1.0 K/uL Final  . Eosinophils Relative 10/13/2015 2  % Final  . Eosinophils Absolute 10/13/2015 0.1  0 - 0.7 K/uL Final  . Basophils Relative 10/13/2015 1  % Final  . Basophils Absolute 10/13/2015 0.0  0 - 0.1 K/uL Final  . Sodium 10/13/2015 133* 135 - 145 mmol/L Final  . Potassium 10/13/2015 3.5  3.5 - 5.1 mmol/L Final  . Chloride 10/13/2015 94* 101 - 111 mmol/L Final  . CO2 10/13/2015 31  22 - 32 mmol/L Final  . Glucose, Bld 10/13/2015 144* 65 - 99 mg/dL Final  . BUN 10/13/2015 18  6 - 20 mg/dL Final  . Creatinine, Ser 10/13/2015 0.98  0.61 - 1.24 mg/dL Final  . Calcium 10/13/2015 8.7* 8.9 - 10.3 mg/dL Final  . Total Protein 10/13/2015 7.0  6.5 - 8.1 g/dL Final  . Albumin 10/13/2015 3.4* 3.5 - 5.0 g/dL Final  . AST 10/13/2015 27  15 - 41 U/L Final  . ALT 10/13/2015 39  17 - 63 U/L Final  . Alkaline Phosphatase 10/13/2015 91  38 - 126 U/L Final  . Total Bilirubin 10/13/2015 0.5  0.3 - 1.2 mg/dL Final  . GFR calc non Af Amer 10/13/2015 >60  >60 mL/min Final  . GFR calc Af Amer 10/13/2015 >60  >60 mL/min Final   Comment: (NOTE) The eGFR has been calculated using the CKD EPI equation. This calculation has not been validated in all clinical situations. eGFR's persistently <60 mL/min signify possible Chronic Kidney Disease.   . Anion gap 10/13/2015 8  5 - 15 Final  . Lactic Acid, Venous 10/13/2015 1.9  0.5 - 1.9 mmol/L Final  Appointment on 10/12/2015  Component Date Value Ref Range Status  . WBC 10/12/2015 5.6  3.8 - 10.6 K/uL Final  . RBC 10/12/2015 3.77* 4.40 - 5.90 MIL/uL Final  . Hemoglobin 10/12/2015 11.7* 13.0 - 18.0 g/dL Final  . HCT 10/12/2015 34.5* 40.0 - 52.0 % Final  . MCV 10/12/2015  91.7  80.0 - 100.0 fL Final  . MCH 10/12/2015 31.0  26.0 - 34.0 pg Final  . MCHC 10/12/2015 33.8  32.0 - 36.0 g/dL Final  . RDW 10/12/2015 14.9* 11.5 - 14.5 % Final  . Platelets 10/12/2015 163  150 - 440 K/uL Final  . Neutrophils Relative % 10/12/2015 83  % Final  . Neutro Abs 10/12/2015 4.6  1.4 - 6.5 K/uL Final  . Lymphocytes Relative 10/12/2015 12  % Final  . Lymphs Abs 10/12/2015 0.7* 1.0 - 3.6 K/uL Final  . Monocytes Relative 10/12/2015 5  % Final  . Monocytes Absolute 10/12/2015 0.3  0.2 - 1.0 K/uL Final  . Eosinophils Relative 10/12/2015 0  % Final  . Eosinophils Absolute 10/12/2015 0.0  0 - 0.7 K/uL Final  . Basophils Relative 10/12/2015 0  % Final  . Basophils Absolute 10/12/2015 0.0  0 - 0.1 K/uL Final  . Sodium 10/12/2015 133* 135 - 145 mmol/L Final  . Potassium 10/12/2015 3.8  3.5 - 5.1 mmol/L Final  . Chloride 10/12/2015 97* 101 - 111 mmol/L Final  . CO2 10/12/2015 28  22 - 32 mmol/L Final  . Glucose, Bld 10/12/2015 247* 65 - 99 mg/dL Final  . BUN 10/12/2015 20  6 - 20 mg/dL Final  . Creatinine, Ser 10/12/2015 0.89  0.61 - 1.24 mg/dL Final  . Calcium 10/12/2015 8.9  8.9 - 10.3 mg/dL Final  . GFR calc non Af Amer 10/12/2015 >60  >60 mL/min Final  . GFR calc Af Amer 10/12/2015 >60  >60 mL/min Final   Comment: (NOTE) The eGFR has been calculated using the CKD EPI equation. This calculation has not been validated in all clinical situations. eGFR's persistently <60 mL/min signify possible Chronic Kidney Disease.   Georgiann Hahn gap 10/12/2015 8  5 - 15 Final    Assessment:  Darryl Hatfield is a 55 y.o. 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    He was diagnosed with stage IIIA squamous cell lung cancer in 07/02/2013.  PET scan on 06/23/2013 revealed a 1.5 cm right upper lobe nodule (SUV 7.4) concerning for primary bronchogenic carcinoma.  There were hypermetabolic ipsilateral hilar and mediastinal  lymph node metastasis. There was malignant range FDG uptake associated with bilateral level 2 cervical lymph nodes of uncertain significance.  EBUS assisted biopsy of a right lower paratracheal and right hilar lymph node on 07/02/2013 confirmed non-small cell carcinoma, favoring squamous cell carcinoma.  Clinical stage was T1N2M0.  He received concurrent chemotherapy and radiation.  He received 6,000 cGy from 08/05/2014 - 09/14/2013.  He received weekly carboplatin and Taxol x 6 from 08/02/2013 - 10/25/2013.  Course was complicated by a late pneumothorax secondary to port placement.  He also had a seizure in 03/2014 with subsequent fracture of lumbar vertebrae and rod placement.  PET scan on 04/12/2015 revealed mild soft tissue fullness along the posterior right upper lobe/perihilar region, with associated mild vague hypermetabolism (SUV 2.6).  There were hypermetabolic bilateral adrenal metastases, right (2.7 cm; SUV 10) greater than left (2.1 cm; SUV 7.8).  There was small hypermetabolic upper abdominal nodal metastases (9 mm porta hepatitis (SUV 7.3) and 7 mm portacaval node (SUV 5.3)).  CT guided right adrenal biopsy on 04/25/2015 revealed metastatic adenocarcinoma of lung primary.  TTF-1 was positive.  PD-L1 testing was negative (< 1%).  EGFR revealed no mutation.  There was no ALK rearrangement.  ROS1 revealed no result (unsuccessful).  RET gene rearrangement was negative.    CEA was 36.5 on 05/25/2015, 34 on 06/15/2015, and 35.3 on 07/06/2015, 48.8 on 07/27/2015, 62.5 on 08/17/2015, and 115.6 on 09/07/2015.  He has a mild normocytic anemia.  Anemia work-up on 04/05/2015 revealed iron deficiency anemia (ferritin 48, iron saturation 12%, TIBC 310).  Sedimentation rate was 27.  B12 was 255 (low normal) with a folate 13.8 (normal).  MMA was normal (246).  TSH was 8.840 (high) with normal T4 and T3U, and free thyroxine index.   His last colonoscopy was just prior to his diagnosis.  He has never  had an EGD.  He denies any melena or hematochezia.  Diet is poor.  He is on oral iron.  Echo on 08/18/2014 revealed an EF of 45%.  Echo at [facility] on 09/19/2015 revealed an EF of 50%.  He received 5 cycles of carboplatin and Alimta  (05/25/2015 - 08/17/2015).  Chest, abdomen, and pelvic CT scan on 08/09/2015 revealed radiation changes in the right upper lobe extending to the perihilar region. There was stable mild soft tissue prominence laterally.  There were stable bilateral adrenal metastases.  There were mildly prominent upper abdominal lymph nodes measuring up to 11 mm.  Bone scan on 08/11/2015 revealed mild multifocal areas of increased activity about the cervical and lumbar spine and right shoulder, most likely degenerative.   PET scan on 09/06/2015 revealed interval progression of nodal metastasis with new upper mediastinal hypermetabolic adenopathy, bilateral hypermetabolic lymph nodes adjacent to the tails of the parotid glands, and increased number of hypermetabolic periaortic lymph nodes.  There was stable to mildly increased bilateral adrenal metastasis.  He received 1 cycle of nivolumab (09/14/2015).  Course was complicated by colitis. Abdominal and pelvic CT scan on 09/19/2015 revealed pancolitis.  Flexible sigmoidoscopy on 09/21/2015 showed patchy nonspecific elevation sparing the rectum, worse in the sigmoid colon and up to the transverse colon.  Diarrhea and hematochezia stopped abruptly with steroids instituted on 09/19/2015.  He is on a prednisone taper (current dose 30 mg a day).  He was admitted to [facility] from 09/17/2015 - 09/26/2015 with an NSTEMI.  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 There was 50% mid LAD and mid RCA lesion.  Chest CT angiogram on 09/21/2015 confirmed bilateral pulmonary emboli. Bilateral lower extremity duplex on 09/22/2015 revealed bilateral femoral DVTs.  An IVC filter was placed.  He is on Lovenox  60 mg every 12 hours.  He has a fever on steroids.  He has had several episodes of diarrhea.  He has a green productive cough and increased erythema around his zoster lesions.  Plan: 1.  Labs today:  CBC with diff, CMP, lactic acid. 2.  Blood cultures x 2. 3.  Discuss plan for admission to hospital.  Discuss with hospitalist service.  No GI coverage this weekend.  Recommend transport to ER then transfer to [facility] for ongoing care. 4.  Discuss with ER physicians.  Discuss plan to stabilize patient. Sepsis protocol.  Begin broad spectrum antibiotics. 5.  RTC after discharge from [facility]   Lequita Asal, MD  10/13/2015,  4:05 PM

## 2015-10-13 NOTE — Progress Notes (Signed)
Pt has HA rating 10 across forehead-pt given 2 tylenol for pain

## 2015-10-13 NOTE — ED Notes (Signed)
Pt. Has active shingles on  Lower lt. abdominal area.  Pt. Being moved from room #8 to #6.

## 2015-10-13 NOTE — ED Provider Notes (Signed)
[facility] Emergency Department Provider Note  ____________________________________________   First MD Initiated Contact with Patient 10/13/15 1709     (approximate)  I have reviewed the triage vital signs and the nursing notes.   HISTORY  Chief Complaint Fever    HPI Darryl Hatfield is a 55 y.o. male with an extremely located medical history of metastatic lung cancer on chemotherapyas well as recent STEMI, bilateral DVTs, bilateral PEs, and chemotherapy-induced colitis, all treated at [facility] about 2 weeks ago.  He was seen subsequently for possible stroke in this emergency department but that was ruled out with MRI.  He comes into this emergency department today after being seen by Dr. Mike Gip at the [facility] with reports of acute onset of fever, chest pain, shortness of breath, and multiple episodes of nonbloody diarrhea today symptoms persistent abdominal pain.  He had lab work performed earlier at the cancer center and then Dr. Mike Gip spoke with the [facility] hospitalists directly.  They told her that the patient would need to be transferred to [facility] because we do not have any GI coverage.  Additionally, the patient and his wife strongly prefer that he go back to [place] since he has been there recently.  He currently reports that he feels ill today and has been vomiting and having multiple episodes of diarrhea and the waxing and waning abdominal pain as described above.  He had a fever of 101 at the cancer center and is immunosuppressed on chemotherapy and prednisone.  He has been coughing up sputum and has a persistent cough which has started acutely over the last couple of days.  Symptoms were acute in onset and are severe.  Nothing is making better nor worse.   Past Medical History:  Diagnosis Date  . CHF (congestive heart failure) ([place])   . COPD (chronic obstructive pulmonary disease) ([place])   . Hypothyroidism   . Lung cancer  ([place]) 2015   RIght   . Seizures [facility])     Patient Active Problem List   Diagnosis Date Noted  . Pulmonary embolism without acute cor pulmonale ([place]) 10/07/2015  . Acute deep vein thrombosis (DVT) of femoral vein of both lower extremities ([place]) 10/07/2015  . Colitis 10/07/2015  . Dehydration 09/28/2015  . Epigastric discomfort 09/16/2015  . STEMI (ST elevation myocardial infarction) ([place]) 09/16/2015  . Pruritus 07/27/2015  . Constipation 06/05/2015  . Anxiety 05/12/2015  . Weight loss 05/12/2015  . 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 Adenocarcinoma of lung, stage 4 ([place]) 04/25/2015  . Iron deficiency anemia 04/05/2015  . Episodes of formed visual hallucinations 12/26/2014  . Dementia with behavioral disturbance 12/26/2014  . Delirium 12/25/2014  . Cancer of upper lobe of right lung ([place]) 07/02/2013  . SCIATICA 03/01/2010    Past Surgical History:  Procedure Laterality Date  . BACK SURGERY    . PORTACATH PLACEMENT Right     Prior to Admission medications   Medication Sig Start Date End Date Taking? Authorizing Provider  acetaminophen (TYLENOL) 325 MG tablet Take 650 mg by mouth every 6 (six) hours as needed for mild pain.  04/21/14   Historical Provider, MD  albuterol (PROVENTIL) (2.5 MG/3ML) 0.083% nebulizer solution Take 3 mLs (2.5 mg total) by nebulization every 6 (six) hours as needed for wheezing or shortness of breath. Patient taking differently: Take 2.5 mg by nebulization 2 (two) times daily.  02/03/15   Dmitriy Berenzon, MD  aluminum-magnesium hydroxide-simethicone ([place]) 400-867-61 MG/5ML SUSP  Take 30 mLs by mouth 4 (four) times daily -  before meals and at bedtime. 09/29/15   Carrie Mew, MD  aspirin 81 MG EC tablet  09/25/15   Historical Provider, MD  atorvastatin (LIPITOR) 80 MG tablet Take 80 mg by mouth daily.  09/25/15 10/25/15  Historical Provider, MD  Calcium Carbonate-Vitamin D 600-400 MG-UNIT tablet Take 1 tablet by mouth 2 (two) times daily.   09/26/15 09/25/16  Historical Provider, MD  dexamethasone (DECADRON) 4 MG tablet TAKE 1 TAB 2 TIMES A DAY BEFORE CHEMO. TAKE 2 TABS TWO TIMES A DAY STARTING THE DAY AFTER CHEMO 08/17/15   Historical Provider, MD  DULoxetine (CYMBALTA) 30 MG capsule Take 30 mg by mouth daily.    Historical Provider, MD  enoxaparin (LOVENOX) 60 MG/0.6ML injection Inject 0.6 mLs (60 mg total) into the skin every 12 (twelve) hours. 09/28/15 10/28/15  Lequita Asal, MD  folic acid (FOLVITE) 1 MG tablet TAKE 1 TABLET BY MOUTH DAILY START 5-7 DAYS BEFORE ALIMTA CHEMO, CONTINUE UNTIL 21 DAYS AFTER ALIMTA 09/13/15   Lequita Asal, MD  gabapentin (NEURONTIN) 100 MG capsule Take 1 capsule (100 mg total) by mouth 3 (three) times daily. 03/15/15   Dmitriy Berenzon, MD  hydrOXYzine (ATARAX/VISTARIL) 10 MG tablet Take 1 tablet (10 mg total) by mouth 3 (three) times daily as needed for itching. 10/05/15   Lequita Asal, MD  levothyroxine (SYNTHROID, LEVOTHROID) 88 MCG tablet Take 1 tablet (88 mcg total) by mouth daily before breakfast. Patient taking differently: Take 44 mcg by mouth daily before breakfast.  06/16/15   Lequita Asal, MD  LORazepam (ATIVAN) 0.5 MG tablet Take 1 tablet (0.5 mg total) by mouth every 8 (eight) hours as needed for anxiety. 06/16/15   Lequita Asal, MD  metoCLOPramide (REGLAN) 10 MG tablet Take 1 tablet (10 mg total) by mouth every 6 (six) hours as needed. 09/29/15   Carrie Mew, MD  metoprolol succinate (TOPROL-XL) 25 MG 24 hr tablet  09/26/15   Historical Provider, MD  nitroGLYCERIN (NITROSTAT) 0.4 MG SL tablet Place 0.4 mg under the tongue every 5 (five) minutes as needed.  09/25/15   Historical Provider, MD  ondansetron (ZOFRAN) 8 MG tablet Take 1 tablet (8 mg total) by mouth every 8 (eight) hours as needed for nausea or vomiting. 09/07/15   Lequita Asal, MD  pantoprazole (PROTONIX) 40 MG tablet Take by mouth. 09/25/15 10/25/15  Historical Provider, MD  predniSONE (DELTASONE) 20 MG tablet  Take 2 tablets (40 mg total) by mouth daily with breakfast. Taper as direct by MD 10/05/15   Lequita Asal, MD  QUEtiapine (SEROQUEL) 50 MG tablet Take 50 mg by mouth daily.  07/18/15 01/14/16  Historical Provider, MD  simethicone (MYLICON) 80 MG chewable tablet Chew 80 mg by mouth daily.  09/25/15 10/05/15  Historical Provider, MD  tiotropium (SPIRIVA) 18 MCG inhalation capsule Place 18 mcg into inhaler and inhale daily.    Historical Provider, MD    Allergies No known allergies  Family History  Problem Relation Age of Onset  . Hypertension Other   . Heart failure Other   . Cancer Father     Social History Social History  Substance Use Topics  . Smoking status: Former Smoker    Packs/day: 1.00    Years: 40.00    Quit date: 06/20/2013  . Smokeless tobacco: Never Used  . Alcohol use No    Review of Systems Constitutional: +fever/chills Eyes: No visual changes. ENT: No  sore throat. Cardiovascular: +chest pain. Respiratory: +shortness of breath, productive cough Gastrointestinal: +abdominal pain.  No nausea, no vomiting.  +diarrhea.  No constipation. Genitourinary: Negative for dysuria. Musculoskeletal: Negative for back pain. Skin: +rash concerning for shingles on left side of abdomen (taking acycolvir) Neurological: Negative for headaches, focal weakness or numbness.  10-point ROS otherwise negative.  ____________________________________________   PHYSICAL EXAM:  VITAL SIGNS: ED Triage Vitals [10/13/15 1652]  Enc Vitals Group     BP 119/80     Pulse Rate (!) 112     Resp 20     Temp (!) 100.4 F (38 C)     Temp Source Oral     SpO2 100 %     Weight      Height      Head Circumference      Peak Flow      Pain Score      Pain Loc      Pain Edu?      Excl. in [place]?     Constitutional: Alert and oriented. Ill appearing but does not appear toxic at this time Eyes: Conjunctivae are normal. PERRL. EOMI. Head: Atraumatic. Nose: No  congestion/rhinnorhea. Mouth/Throat: Mucous membranes are moist.  Oropharynx non-erythematous. Neck: No stridor.  No meningeal signs.   Cardiovascular: Tachycardia, regular rhythm. Good peripheral circulation. Grossly normal heart sounds. Respiratory: Normal respiratory effort.  No retractions. Lungs with crackles in lower lobes.  Thick productive cough. Gastrointestinal: Soft with diffuse abd tenderness through abdomen.  Non-distended. Musculoskeletal: No lower extremity tenderness nor edema. No gross deformities of extremities. Neurologic:  Normal speech and language. No gross focal neurologic deficits are appreciated.  Skin:  Skin is warm, dry and intact. Vesicular rash several cm in diameter on left side of abdomen, very tender to palpation, c/w shingles. Psychiatric: Mood and affect are normal. Speech and behavior are normal.  ____________________________________________   LABS (all labs ordered are listed, but only abnormal results are displayed)  Labs Reviewed  URINE CULTURE  LACTIC ACID, PLASMA  LIPASE, BLOOD  PROTIME-INR  TROPONIN I  LACTIC ACID, PLASMA  URINALYSIS COMPLETEWITH MICROSCOPIC ([facility] ONLY)  BASIC METABOLIC PANEL   ____________________________________________  EKG  ED ECG REPORT I, Andreas Sobolewski, the attending physician, personally viewed and interpreted this ECG.  Date: 10/13/2015 EKG Time: 16:55 Rate: 139 Rhythm: Sinus tachycardia QRS Axis: normal Intervals: normal ST/T Wave abnormalities: normal Conduction Disturbances: none Narrative Interpretation: unremarkable  ____________________________________________  RADIOLOGY   Dg Chest Port 1 View  Result Date: 10/13/2015 CLINICAL DATA:  Fever. EXAM: PORTABLE CHEST 1 VIEW COMPARISON:  09/29/2015.  PET-CT 09/06/2015. FINDINGS: Port-A-Cath noted with tip over the cavoatrial junction. Persistent density noted over the right perihilar region most likely secondary to prior radiation. No change from prior  exam. An adjacent mild infiltrate noted consistent with pneumonia. a No pleural effusion or pneumothorax. IMPRESSION: Persistent right perihilar density most likely from prior radiation. And adjacent mild infiltrate consistent with pneumonia is noted. Follow-up chest x-rays to demonstrate clearing suggested. Electronically Signed   By: Marcello Moores  Register   On: 10/13/2015 17:32    ____________________________________________   PROCEDURES  Procedure(s) performed:   .Critical Care Performed by: Hinda Kehr Authorized by: Hinda Kehr   Critical care provider statement:    Critical care time (minutes):  45   Critical care time was exclusive of:  Separately billable procedures and treating other patients   Critical care was necessary to treat or prevent imminent or life-threatening deterioration of the following conditions:  Sepsis   Critical care was time spent personally by me on the following activities:  Development of treatment plan with patient or surrogate, discussions with consultants, evaluation of patient's response to treatment, examination of patient, obtaining history from patient or surrogate, ordering and performing treatments and interventions, ordering and review of laboratory studies, ordering and review of radiographic studies, pulse oximetry, re-evaluation of patient's condition and review of old charts       Critical Care performed: Yes, see critical care procedure note(s) ____________________________________________   INITIAL IMPRESSION / [place] / ED COURSE  Pertinent labs & imaging results that were available during my care of the patient were reviewed by me and considered in my medical decision making (see chart for details).  I spoke by phone with Dr. Mike Gip who confirm the story as described above.  She tried to admit the patient to the hospitalist here but they were concerned about having a GI coverage and the patient and the patient's wife feel  strongly about going back to [facility] if at all possible.  I will proceed with sepsis workup and we will give empiric antibiotics as well as 30 mL/kg of normal saline IV fluids.  I will call Dr. Virgina Evener her.   Clinical Course  Value Comment By Time   I spoke with the [facility] transfer center and explained the situation. They mentioned that beds are tight right now but they would speak with the hospitalist who would call me back.  Dr. Mike Gip left me her number [phone]) at which the [facility] physicians can also reach her for additional history as needed. Hinda Kehr, MD 09/22 1800  DG Chest Port 1 View Patient appears to have a new pneumonia.  Will broaden antibiotic coverage for HCAP. Hinda Kehr, MD 09/22 1800   I spoke by phone with Dr. Pablo Lawrence at [facility].  He accepts the patient to a stepdown bed, we are now awaiting bed assignment.  Should be able to go via local EMS if he remains hemodynamically stable.  Gave Vanc, Zosyn, and acyclovir (for the Zoster while immunosuppressed).  Repeat lactate 1.3 which is reassuring.  Normal renal function.  Hinda Kehr, MD 09/22 1840    ____________________________________________  FINAL CLINICAL IMPRESSION(S) / ED DIAGNOSES  Final diagnoses:  Sepsis, due to unspecified organism ([place])  Fever, unspecified fever cause  HCAP (healthcare-associated pneumonia)  Diarrhea, unspecified type  Abdominal pain, unspecified abdominal location  Immunosuppression ([place])  Shingles rash     MEDICATIONS GIVEN DURING THIS VISIT:  Medications  piperacillin-tazobactam (ZOSYN) IVPB 3.375 g (0 g Intravenous Stopped 10/13/15 1826)  vancomycin (VANCOCIN) IVPB 1000 mg/200 mL premix (0 mg Intravenous Stopped 10/13/15 1927)  morphine 4 MG/ML injection 4 mg (4 mg Intravenous Given 10/13/15 1827)  acyclovir (ZOVIRAX) 625 mg in dextrose 5 % 100 mL IVPB (0 mg/kg  62.4 kg Intravenous Stopped 10/13/15 2152)     NEW OUTPATIENT MEDICATIONS STARTED DURING THIS VISIT:  Discharge  Medication List as of 10/13/2015  9:53 PM      Discharge Medication List as of 10/13/2015  9:53 PM      Discharge Medication List as of 10/13/2015  9:53 PM       Note:  This document was prepared using Dragon voice recognition software and may include unintentional dictation errors.    Hinda Kehr, MD 10/14/15 [phone]

## 2015-10-13 NOTE — Progress Notes (Signed)
Patient here for sick visit today. He is febrile, has had 5 episodes of diarrhea today, poor appetite, sever headache and multiple areas of pain.

## 2015-10-13 NOTE — ED Notes (Signed)
Pt. States recent hx of colitis.  Pt. States he was advised to come over to the ED for evaluation.  Pt. States today he has couple episodes of diarrhea today.  Pt. Denies seeing blood in diarrhea.  Pt. States ongoing cough for the past 3 days.  Pt. Has some shingles on lt. Side of abdominal area.  Pt. States he is being treated at the cancer center.  Pt. States stage 4 lung CA.

## 2015-10-13 NOTE — Telephone Encounter (Signed)
I called and spoke to [facility] and she states he did not have another stool sincc this am.  He has not ate or drank much. He has been laying down.  He has pain in mid back above surgery area-he has not taken any pain med.  He is still having abd. Pain.  Pain 10/10. He is still coughing up green sputum and has been 2-3 days.  She did not feel that he has a fever but she found thermometer and it was 99.6.  We told her to bring him in and they are agreeeable

## 2015-10-13 NOTE — ED Notes (Signed)
Code  Sepsis  Called  To  [facility] 

## 2015-10-13 NOTE — ED Notes (Signed)
Called report to [facility] hospital.

## 2015-10-13 NOTE — ED Notes (Signed)
Talked to [facility] and gave report.

## 2015-10-13 NOTE — Telephone Encounter (Signed)
Complaining of sharp constant abd pain from umbilicus down. Had one loose stool this morning, states it was very soft. Concerned about reducing steroid dose. Pain rated as 10/10. ASking that something be done for him. Please advise

## 2015-10-13 NOTE — ED Triage Notes (Signed)
C/o fever and diarrhea onset this am.  Reports lung CA and his CA doctor sent him for further eval.

## 2015-10-13 NOTE — Progress Notes (Addendum)
Pharmacy Antibiotic Note  Darryl Hatfield is a 55 y.o. male admitted on 10/13/2015 with sepsis/?PNA.  Pharmacy has been consulted for Zosyn and Vancomycin dosing. Hx Stage 4 Lung cancer, colitis.  Received Nivolumab 09/14/15.  Plan: Patient received Vancomycin 1 gram x1 and Zosyn 3.375gm x1  in ER. Will continue with stacked dosing of Vancomycin. Vancomycin 1000 IV every 12 hours.  Goal trough 15-20 mcg/mL. Zosyn 3.375g IV q8h (4 hour infusion).  Ke=0.066  T1/2 10.5  Vd 43.68    Trough scheduled prior to 5th dose on 9/24 at 1630.      Temp (24hrs), Avg:100.1 F (37.8 C), Min:99.5 F (37.5 C), Max:100.4 F (38 C)   Recent Labs Lab 10/12/15 1025 10/13/15 1444 10/13/15 1445 10/13/15 1709  WBC 5.6 6.1  --   --   CREATININE 0.89 0.98  --   --   LATICACIDVEN  --   --  1.9 1.3    Estimated Creatinine Clearance: 74.3 mL/min (by C-G formula based on SCr of 0.98 mg/dL).    Allergies  Allergen Reactions  . No Known Allergies     Antimicrobials this admission: Zosyn 9/22 >>   Vanc  9/22 >>   Acyclovir x1 in ER.  Dose adjustments this admission:    Microbiology results: 9/22 BCx: Pend 9/22 UCx: Pend    Sputum:      MRSA PCR:   CXR: ?PNA  Thank you for allowing pharmacy to be a part of this patient's care.  Wendie Diskin A 10/13/2015 8:09 PM

## 2015-10-13 NOTE — Telephone Encounter (Signed)
  We can see him in clinic.  M

## 2015-10-14 ENCOUNTER — Encounter: Payer: Self-pay | Admitting: Hematology and Oncology

## 2015-10-14 NOTE — ED Provider Notes (Signed)
-----------------------------------------   1:30 AM on 10/14/2015 -----------------------------------------  I spoke by phone with the hospitalist who admitted Mr. Leino and is currently caring for him.  She was understanding about my omission of IV fluids and assured me that he is stable and doing well.  She will order fluids appropriate for his current clinical status.   ----------------------------------------- 1:11 AM on 10/14/2015 -----------------------------------------  While reviewing my chart on this patient, I was dismayed to discover that because of an oversight, I failed to order the 1m/kg fluid bolus that I intended him to receive.  He did receive the two antibiotics and antiviral medication, but his weight had not been recorded when I went to order his fluids, and though I intended to go back and order the weight-based fluids after the weight was entered into the computer, I apparently became distracted and failed to do so.  He was improving clinically, with slightly improved tachycardia and decreasing lactate, which further convinced me that his treatment had been completed as I intended.  [facility] because I thought that he was.  I just placed a call to the [facility] transfer center in an attempt to speak with the hospitalist currently caring for the patient so I could bring this omission on my part to their attention.   CHinda Kehr MD 10/14/15 [phone]

## 2015-10-18 LAB — CULTURE, BLOOD (ROUTINE X 2)
Culture: NO GROWTH
Culture: NO GROWTH

## 2015-10-18 NOTE — Progress Notes (Deleted)
Darryl [facility]field day:  10/18/15    Chief Complaint: Darryl Hatfield is a 55 y.o. 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  At that time, had a fever on steroids.  He had several episodes of diarrhea.  He had a green productive cough and increased erythema around his zoster lesions.  Decision was made to admit to the hospital.  As no GI coverage was available, he was transferred to [facility].  He was admitted to [facility] from 10/13/2015-10/18/2015.  He had 5 loose stools on 10/13/2015 but none since that time. CT scan revealed no definitive evidence of acute intra-abdominal process.  His prednisone was increased back to 50 mg a day.  He was started on Bactrim prophylaxis Monday Wednesday Friday. Studies were sent he was treated with Zosyn every 8 hours. Chest x-ray revealed a new right new rounded opacity in the right midlung S postobstructive pneumonia versus inflammatory process. In culture grew rare gram-positive cocci. He was started on azithromycin 500 every 24 hours he continued valacyclovir 3 times a day for zoster on the thorax.    Past Medical History:  Diagnosis Date  . CHF (congestive heart failure) ([place])   . COPD (chronic obstructive pulmonary disease) ([place])   . Hypothyroidism   . Lung cancer ([place]) 2015   RIght   . Seizures ([place])     Past Surgical History:  Procedure Laterality Date  . BACK SURGERY    . PORTACATH PLACEMENT Right     Family History  Problem Relation Age of Onset  . Hypertension Other   . Heart failure Other   . Cancer Father     Social History:  reports that he quit smoking about 2 years ago. He has a 40.00 pack-year smoking history. He has never used smokeless tobacco. He reports that he does not drink alcohol or use  drugs.  He is on disability.  He is accompanied by his wife, [name].  Allergies:  Allergies  Allergen Reactions  . No Known Allergies     Current Medications: Current Outpatient Prescriptions  Medication Sig Dispense Refill  . acetaminophen (TYLENOL) 325 MG tablet Take 650 mg by mouth every 6 (six) hours as needed for mild pain.     Marland Kitchen albuterol (PROVENTIL) (2.5 MG/3ML) 0.083% nebulizer solution Take 3 mLs (2.5 mg total) by nebulization every 6 (six) hours as needed for wheezing or shortness of breath. (Patient taking differently: Take 2.5 mg by nebulization 2 (two) times daily. ) 75 mL 2  . aluminum-magnesium hydroxide-simethicone (MAALOX) 263-785-88 MG/5ML SUSP Take 30 mLs by mouth 4 (four) times daily -  before meals and at bedtime. 355 mL 0  . aspirin 81 MG EC tablet   0  . atorvastatin (LIPITOR) 80 MG tablet Take 80 mg by mouth daily.     . Calcium Carbonate-Vitamin D 600-400 MG-UNIT tablet Take 1 tablet by mouth 2 (two) times daily.     Marland Kitchen dexamethasone (DECADRON) 4 MG tablet TAKE 1 TAB 2 TIMES A DAY BEFORE CHEMO. TAKE 2 TABS TWO TIMES A DAY STARTING THE DAY AFTER CHEMO  1  . DULoxetine (CYMBALTA) 30 MG capsule Take 30 mg by mouth daily.    Marland Kitchen enoxaparin (LOVENOX) 60 MG/0.6ML injection Inject 0.6 mLs (60 mg total) into the  skin every 12 (twelve) hours. 60 Syringe 0  . folic acid (FOLVITE) 1 MG tablet TAKE 1 TABLET BY MOUTH DAILY START 5-7 DAYS BEFORE ALIMTA CHEMO, CONTINUE UNTIL 21 DAYS AFTER ALIMTA 100 tablet 3  . gabapentin (NEURONTIN) 100 MG capsule Take 1 capsule (100 mg total) by mouth 3 (three) times daily. 90 capsule 4  . hydrOXYzine (ATARAX/VISTARIL) 10 MG tablet Take 1 tablet (10 mg total) by mouth 3 (three) times daily as needed for itching. 60 tablet 1  . levothyroxine (SYNTHROID, LEVOTHROID) 88 MCG tablet Take 1 tablet (88 mcg total) by mouth daily before breakfast. (Patient taking differently: Take 44 mcg by mouth daily before breakfast. ) 30 tablet 0  . LORazepam (ATIVAN) 0.5  MG tablet Take 1 tablet (0.5 mg total) by mouth every 8 (eight) hours as needed for anxiety. 30 tablet 0  . metoCLOPramide (REGLAN) 10 MG tablet Take 1 tablet (10 mg total) by mouth every 6 (six) hours as needed. 30 tablet 0  . metoprolol succinate (TOPROL-XL) 25 MG 24 hr tablet   2  . nitroGLYCERIN (NITROSTAT) 0.4 MG SL tablet Place 0.4 mg under the tongue every 5 (five) minutes as needed.   0  . ondansetron (ZOFRAN) 8 MG tablet Take 1 tablet (8 mg total) by mouth every 8 (eight) hours as needed for nausea or vomiting. 30 tablet 3  . pantoprazole (PROTONIX) 40 MG tablet Take by mouth.    . predniSONE (DELTASONE) 20 MG tablet Take 2 tablets (40 mg total) by mouth daily with breakfast. Taper as direct by MD 30 tablet 0  . QUEtiapine (SEROQUEL) 50 MG tablet Take 50 mg by mouth daily.     . simethicone (MYLICON) 80 MG chewable tablet Chew 80 mg by mouth daily.     Marland Kitchen tiotropium (SPIRIVA) 18 MCG inhalation capsule Place 18 mcg into inhaler and inhale daily.     No current facility-administered medications for this visit.    Facility-Administered Medications Ordered in Other Visits  Medication Dose Route Frequency Provider Last Rate Last Dose  . sodium chloride 0.9 % injection 10 mL  10 mL Intracatheter PRN Forest Gleason, MD   10 mL at 06/27/14 1338  . sodium chloride 0.9 % injection 10 mL  10 mL Intracatheter PRN Leia Alf, MD   10 mL at 08/22/14 0851  . sodium chloride flush (NS) 0.9 % injection 10 mL  10 mL Intravenous PRN Lequita Asal, MD        Review of Systems:  GENERAL:  Feels bad.  Fever and chills at home.  No sweats. PERFORMANCE STATUS (ECOG):  2 HEENT:  No visual changes, runny nose, sore throat, mouth sores or tenderness. Lungs:  Cough productive of yellow/green sputum.  No shortness of breath.  No hemoptysis. Cardiac:  No chest pain, palpitations, orthopnea, or PND. GI:  5 episodes of diarrhea.  Abdominal cramping.  No nausea, vomiting, melena or hematochezia.  GU:  No  urgency, frequency, dysuria, or hematuria. Musculoskeletal:  Back pain.  Knee pain.  No muscle tenderness. Extremities:  No swelling or pain. Skin:  Rash associated with shingles, worse. Neuro:  Headache.  No numbness or weakness, balance or coordination issues. Endocrine:  No diabetes. Thyroid disease on Synthroid.  No hot flashes or night sweats. Psych:  High anxiety (stable).  No mood changes or depression. Pain:  No focal pain. Review of systems:  All other systems reviewed and found to be negative.  Physical Exam: There were no vitals taken for  this visit. GENERAL:  Thin gentleman lying on the exam table in the exam room in mild distress.  MENTAL STATUS:  Alert and oriented to person, place and time. HEAD:  Black hair with slight graying.  Mustache. Normocephalic, atraumatic, face symmetric, no Cushingoid features. EYES:  Glasses.  Blue eyes.  Pupils equal round and reactive to light and accomodation.  No conjunctivitis or scleral icterus. ENT:  Oropharynx clear without lesion.  Dentures.  Tongue normal. Mucous membranes moist.  RESPIRATORY:  Clear to auscultation without rales, wheezes or rhonchi. CARDIOVASCULAR:  Regular rate and rhythm without murmur, rub or gallop. ABDOMEN:  Soft, slightly tender without guarding or rebound tenderness.  Active bowel sounds and no hepatosplenomegaly.  No masses. SKIN:  Abdominal bruises s/p Lovenox.  Clusted lesions on left side of abdominal wall with surrounding 3.8 x 5 cm halo of erythema. EXTREMITIES:  No lower extremity edema.  No skin discoloration or tenderness.  No palpable cords. LYMPH NODES: No palpable cervical, supraclavicular, axillary or inguinal adenopathy.  NEUROLOGICAL: Unremarkable. PSYCH:  Anxious.    No visits with results within 3 Day(s) from this visit.  Latest known visit with results is:  Admission on 10/13/2015, Discharged on 10/13/2015  Component Date Value Ref Range Status  . Lactic Acid, Venous 10/13/2015 1.3  0.5 -  1.9 mmol/L Final  . Lipase 10/13/2015 17  11 - 51 U/L Final  . Prothrombin Time 10/13/2015 13.0  11.4 - 15.2 seconds Final  . INR 10/13/2015 0.98   Final  . Troponin I 10/13/2015 <0.03  <0.03 ng/mL Final    Assessment:  AAYANSH CODISPOTI is a 55 y.o. 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    He was diagnosed with stage IIIA squamous cell lung cancer in 07/02/2013.  PET scan on 06/23/2013 revealed a 1.5 cm right upper lobe nodule (SUV 7.4) concerning for primary bronchogenic carcinoma.  There were hypermetabolic ipsilateral hilar and mediastinal lymph node metastasis. There was malignant range FDG uptake associated with bilateral level 2 cervical lymph nodes of uncertain significance.  EBUS assisted biopsy of a right lower paratracheal and right hilar lymph node on 07/02/2013 confirmed non-small cell carcinoma, favoring squamous cell carcinoma.  Clinical stage was T1N2M0.  He received concurrent chemotherapy and radiation.  He received 6,000 cGy from 08/05/2014 - 09/14/2013.  He received weekly carboplatin and Taxol x 6 from 08/02/2013 - 10/25/2013.  Course was complicated by a late pneumothorax secondary to port placement.  He also had a seizure in 03/2014 with subsequent fracture of lumbar vertebrae and rod placement.  PET scan on 04/12/2015 revealed mild soft tissue fullness along the posterior right upper lobe/perihilar region, with associated mild vague hypermetabolism (SUV 2.6).  There were hypermetabolic bilateral adrenal metastases, right (2.7 cm; SUV 10) greater than left (2.1 cm; SUV 7.8).  There was small hypermetabolic upper abdominal nodal metastases (9 mm porta hepatitis (SUV 7.3) and 7 mm portacaval node (SUV 5.3)).  CT guided right adrenal biopsy on 04/25/2015 revealed metastatic adenocarcinoma of lung primary.  TTF-1 was positive.  PD-L1 testing was negative (< 1%).  EGFR revealed no mutation.  There  was no ALK rearrangement.  ROS1 revealed no result (unsuccessful).  RET gene rearrangement was negative.    CEA was 36.5 on 05/25/2015, 34 on 06/15/2015, and 35.3 on 07/06/2015, 48.8 on 07/27/2015, 62.5 on 08/17/2015, and 115.6 on 09/07/2015.  He has a mild normocytic anemia.  Anemia work-up  on 04/05/2015 revealed iron deficiency anemia (ferritin 48, iron saturation 12%, TIBC 310).  Sedimentation rate was 27.  B12 was 255 (low normal) with a folate 13.8 (normal).  MMA was normal (246).  TSH was 8.840 (high) with normal T4 and T3U, and free thyroxine index.   His last colonoscopy was just prior to his diagnosis.  He has never had an EGD.  He denies any melena or hematochezia.  Diet is poor.  He is on oral iron.  Echo on 08/18/2014 revealed an EF of 45%.  Echo at [facility] on 09/19/2015 revealed an EF of 50%.  He received 5 cycles of carboplatin and Alimta  (05/25/2015 - 08/17/2015).  Chest, abdomen, and pelvic CT scan on 08/09/2015 revealed radiation changes in the right upper lobe extending to the perihilar region. There was stable mild soft tissue prominence laterally.  There were stable bilateral adrenal metastases.  There were mildly prominent upper abdominal lymph nodes measuring up to 11 mm.  Bone scan on 08/11/2015 revealed mild multifocal areas of increased activity about the cervical and lumbar spine and right shoulder, most likely degenerative.   PET scan on 09/06/2015 revealed interval progression of nodal metastasis with new upper mediastinal hypermetabolic adenopathy, bilateral hypermetabolic lymph nodes adjacent to the tails of the parotid glands, and increased number of hypermetabolic periaortic lymph nodes.  There was stable to mildly increased bilateral adrenal metastasis.  He received 1 cycle of nivolumab (09/14/2015).  Course was complicated by colitis. Abdominal and pelvic CT scan on 09/19/2015 revealed pancolitis.  Flexible sigmoidoscopy on 09/21/2015 showed patchy nonspecific  elevation sparing the rectum, worse in the sigmoid colon and up to the transverse colon.  Diarrhea and hematochezia stopped abruptly with steroids instituted on 09/19/2015.  He is on a prednisone taper (current dose 30 mg a day).  He was admitted to [facility] from 09/17/2015 - 09/26/2015 with an NSTEMI.  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 There was 50% mid LAD and mid RCA lesion.  Chest CT angiogram on 09/21/2015 confirmed bilateral pulmonary emboli. Bilateral lower extremity duplex on 09/22/2015 revealed bilateral femoral DVTs.  An IVC filter was placed.  He is on Lovenox 60 mg every 12 hours.  He has a fever on steroids.  He has had several episodes of diarrhea.  He has a green productive cough and increased erythema around his zoster lesions.  Plan: 1.  Labs today:  CBC with diff, CMP, lactic acid. 2.  Blood cultures x 2. 3.  Discuss plan for admission to hospital.  Discuss with hospitalist service.  No GI coverage this weekend.  Recommend transport to ER then transfer to [facility] for ongoing care. 4.  Discuss with ER physicians.  Discuss plan to stabilize patient. Sepsis protocol.  Begin broad spectrum antibiotics. 5.  RTC after discharge from [facility].   Lequita Asal, MD  10/18/2015, 11:42 PM

## 2015-10-19 ENCOUNTER — Inpatient Hospital Stay: Payer: BLUE CROSS/BLUE SHIELD

## 2015-10-19 ENCOUNTER — Inpatient Hospital Stay: Payer: BLUE CROSS/BLUE SHIELD | Admitting: Hematology and Oncology

## 2015-10-22 HISTORY — PX: COLONOSCOPY: SHX174

## 2015-10-23 ENCOUNTER — Other Ambulatory Visit: Payer: Self-pay | Admitting: Hematology and Oncology

## 2015-10-26 ENCOUNTER — Inpatient Hospital Stay: Payer: BLUE CROSS/BLUE SHIELD | Admitting: Hematology and Oncology

## 2015-10-29 NOTE — Progress Notes (Signed)
[facility] day:  10/30/15    Chief Complaint: Darryl Hatfield is a 55 y.o. 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  At that time, had a fever on steroids.  He had several episodes of diarrhea.  He had a green productive cough and increased erythema around his zoster lesions.  Decision was made to admit to the hospital.  As no GI coverage was available, he was transferred to [facility].  He was admitted to [facility] from 10/13/2015 -  10/25/2015.  He had 5 loose stools on 10/13/2015 but none since that time. CT scan revealed no definitive evidence of acute intra-abdominal process.  Flexible sigmoidoscopy on 10/20/2015 revealed colonic mucosa with epithelial injury. CMV was negative.  His prednisone was increased back to 50 mg a day with plans to decrease his prednisone by 10 mg a week.  His prednisone was decreased to 40 mg a day on 10/23/2015.  He was started on Bactrim prophylaxis Monday, Wednesday, and Friday. Stool studies were sent.  He received 7 days of Zosyn. Chest x-ray revealed a new right new rounded opacity in the right midlung c/w postobstructive pneumonia versus inflammatory process.  He completed a course of azithromycin on 10/19/2015.  He has continued valacyclovir 3 times a day for zoster on the thorax.  Head MRI on 10/22/2015 was negative.  He was seen by palliative care medicine.  Since discharge home, he has felt good for the past 3 days. His prednisone is down to 30 mg a day.  He notes some bruises on his body. He has some knee pain. He denies any abdominal pain or diarrhea.   Past Medical History:  Diagnosis Date  . CHF (congestive heart failure) ([place])   . COPD (chronic obstructive pulmonary disease) ([place])   . Hypothyroidism   . Lung  cancer ([place]) 2015   RIght   . Seizures ([place])     Past Surgical History:  Procedure Laterality Date  . BACK SURGERY    . COLONOSCOPY  10/2015  . PORTACATH PLACEMENT Right     Family History  Problem Relation Age of Onset  . Hypertension Other   . Heart failure Other   . Cancer Father     Social History:  reports that he quit smoking about 2 years ago. He has a 40.00 pack-year smoking history. He has never used smokeless tobacco. He reports that he does not drink alcohol or use drugs.  He is on disability.  He is accompanied by his wife, [name].  Allergies:  Allergies  Allergen Reactions  . No Known Allergies     Current Medications: Current Outpatient Prescriptions  Medication Sig Dispense Refill  . acetaminophen (TYLENOL) 325 MG tablet Take 650 mg by mouth every 6 (six) hours as needed for mild pain.     Marland Kitchen albuterol (PROVENTIL) (2.5 MG/3ML) 0.083% nebulizer solution Take 3 mLs (2.5 mg total) by nebulization every 6 (six) hours as needed for wheezing or shortness of breath. (Patient taking differently: Take 2.5 mg by nebulization 2 (two) times daily. ) 75 mL 2  . aluminum-magnesium hydroxide-simethicone (MAALOX) 401-027-25 MG/5ML SUSP Take 30 mLs by mouth 4 (four) times daily -  before meals and at bedtime. 355 mL 0  . aspirin 81 MG EC tablet  0  . Calcium Carbonate-Vitamin D 600-400 MG-UNIT tablet Take 1 tablet by mouth 2 (two) times daily.     Marland Kitchen dexamethasone (DECADRON) 4 MG tablet TAKE 1 TAB 2 TIMES A DAY BEFORE CHEMO. TAKE 2 TABS TWO TIMES A DAY STARTING THE DAY AFTER CHEMO  1  . DULoxetine (CYMBALTA) 30 MG capsule Take 30 mg by mouth daily.    . folic acid (FOLVITE) 1 MG tablet TAKE 1 TABLET BY MOUTH DAILY START 5-7 DAYS BEFORE ALIMTA CHEMO, CONTINUE UNTIL 21 DAYS AFTER ALIMTA 100 tablet 3  . gabapentin (NEURONTIN) 100 MG capsule Take 1 capsule (100 mg total) by mouth 3 (three) times daily. 90 capsule 4  . hydrOXYzine (ATARAX/VISTARIL) 10 MG tablet Take 1 tablet (10 mg  total) by mouth 3 (three) times daily as needed for itching. 60 tablet 1  . hydrOXYzine (ATARAX/VISTARIL) 10 MG tablet TAKE 1 TABLET (10 MG TOTAL) BY MOUTH 3 (THREE) TIMES DAILY AS NEEDED FOR ITCHING. 60 tablet 1  . levothyroxine (SYNTHROID, LEVOTHROID) 88 MCG tablet Take 1 tablet (88 mcg total) by mouth daily before breakfast. (Patient taking differently: Take 44 mcg by mouth daily before breakfast. ) 30 tablet 0  . LORazepam (ATIVAN) 0.5 MG tablet Take 1 tablet (0.5 mg total) by mouth every 8 (eight) hours as needed for anxiety. 30 tablet 0  . metoCLOPramide (REGLAN) 10 MG tablet Take 1 tablet (10 mg total) by mouth every 6 (six) hours as needed. 30 tablet 0  . metoprolol succinate (TOPROL-XL) 25 MG 24 hr tablet   2  . nicotine (NICODERM CQ - DOSED IN MG/24 HOURS) 14 mg/24hr patch Place onto the skin.    Marland Kitchen nitroGLYCERIN (NITROSTAT) 0.4 MG SL tablet Place 0.4 mg under the tongue every 5 (five) minutes as needed.   0  . ondansetron (ZOFRAN) 8 MG tablet Take 1 tablet (8 mg total) by mouth every 8 (eight) hours as needed for nausea or vomiting. 30 tablet 3  . oxyCODONE (OXY IR/ROXICODONE) 5 MG immediate release tablet TAKE 1 TABLET BY MOUTH EVERY 4 HOURS AS NEEDED FOR UP TO 10 DAYS  0  . predniSONE (DELTASONE) 10 MG tablet 10/5-10/8 - take 4 tabs; 10/9-10/15 - take 3 tabs; 10/16-10/22 - take 2 tabs; 10/23-10/29 - take 1 tabs    . QUEtiapine (SEROQUEL) 50 MG tablet Take 50 mg by mouth daily.     Marland Kitchen sulfamethoxazole-trimethoprim (BACTRIM DS,SEPTRA DS) 800-160 MG tablet Take by mouth.    . tiotropium (SPIRIVA) 18 MCG inhalation capsule Place 18 mcg into inhaler and inhale daily.    . valACYclovir (VALTREX) 500 MG tablet Take by mouth.    Marland Kitchen atorvastatin (LIPITOR) 80 MG tablet Take 80 mg by mouth daily.     Marland Kitchen enoxaparin (LOVENOX) 60 MG/0.6ML injection Inject 0.6 mLs (60 mg total) into the skin every 12 (twelve) hours. 60 Syringe 0  . pantoprazole (PROTONIX) 40 MG tablet Take by mouth.    . simethicone  (MYLICON) 80 MG chewable tablet Chew 80 mg by mouth daily.      No current facility-administered medications for this visit.    Facility-Administered Medications Ordered in Other Visits  Medication Dose Route Frequency Provider Last Rate Last Dose  . sodium chloride 0.9 % injection 10 mL  10 mL Intracatheter PRN Forest Gleason, MD   10 mL at 06/27/14 1338  . sodium chloride 0.9 % injection 10 mL  10 mL Intracatheter PRN Leia Alf, MD   10 mL at 08/22/14 0851  .  sodium chloride flush (NS) 0.9 % injection 10 mL  10 mL Intravenous PRN Lequita Asal, MD        Review of Systems:  GENERAL:  Feels good x 3 days.  No fever or chills.  No sweats.  Weight up 2 pounds. PERFORMANCE STATUS (ECOG):  2 HEENT:  No visual changes, runny nose, sore throat, mouth sores or tenderness. Lungs:  No increased shortness of breath.  No cough.  No hemoptysis. Cardiac:  No chest pain, palpitations, orthopnea, or PND. GI:  No abdominal pain.  No nausea, vomiting, diarrhea, melena or hematochezia.  GU:  No urgency, frequency, dysuria, or hematuria. Musculoskeletal:  Left side hurting.  Back pain.  Knee pain.  No muscle tenderness. Extremities:  No swelling or pain. Skin:  Bruising on arms and abdomen.  Resolving shingles.  No new rashes. Neuro:  No headache, numbness or weakness, balance or coordination issues. Endocrine:  No diabetes. Thyroid disease on Synthroid.  No hot flashes or night sweats. Psych:  High anxiety (stable).  No mood changes or depression. Pain:  No focal pain, currently. Review of systems:  All other systems reviewed and found to be negative.  Physical Exam: Blood pressure 119/79, pulse 87, temperature (!) 96.3 F (35.7 C), temperature source Tympanic, resp. rate 18, weight 139 lb 15.9 oz (63.5 kg). GENERAL:  Thin gentleman sitting in the exam room in no acute distress.  MENTAL STATUS:  Alert and oriented to person, place and time. HEAD:  Black hair with slight graying.  Mustache.  Normocephalic, atraumatic, face symmetric, no Cushingoid features. EYES:  Glasses.  Blue eyes.  Pupils equal round and reactive to light and accomodation.  No conjunctivitis or scleral icterus. ENT:  Oropharynx clear without lesion.  Dentures.  Tongue normal. Mucous membranes moist.  RESPIRATORY:  Clear to auscultation without rales, wheezes or rhonchi. CARDIOVASCULAR:  Regular rate and rhythm without murmur, rub or gallop. ABDOMEN:  Soft, non-tender with active bowel sounds and no hepatosplenomegaly.  No masses. SKIN:  Abdominal bruises s/p Lovenox.  Ecchymosis on upper extremities.  Right cheek erythema. EXTREMITIES:  Thin arms.  No lower extremity edema.  No skin discoloration or tenderness.  No palpable cords. LYMPH NODES: No palpable cervical, supraclavicular, axillary or inguinal adenopathy.  NEUROLOGICAL: Unremarkable. PSYCH:  Anxious.    Orders Only on 10/30/2015  Component Date Value Ref Range Status  . WBC 10/30/2015 10.1  3.8 - 10.6 K/uL Final  . RBC 10/30/2015 3.69* 4.40 - 5.90 MIL/uL Final  . Hemoglobin 10/30/2015 11.4* 13.0 - 18.0 g/dL Final  . HCT 10/30/2015 33.8* 40.0 - 52.0 % Final  . MCV 10/30/2015 91.7  80.0 - 100.0 fL Final  . MCH 10/30/2015 30.8  26.0 - 34.0 pg Final  . MCHC 10/30/2015 33.6  32.0 - 36.0 g/dL Final  . RDW 10/30/2015 15.8* 11.5 - 14.5 % Final  . Platelets 10/30/2015 190  150 - 440 K/uL Final  . Neutrophils Relative % 10/30/2015 65  % Final  . Neutro Abs 10/30/2015 6.6* 1.4 - 6.5 K/uL Final  . Lymphocytes Relative 10/30/2015 23  % Final  . Lymphs Abs 10/30/2015 2.3  1.0 - 3.6 K/uL Final  . Monocytes Relative 10/30/2015 10  % Final  . Monocytes Absolute 10/30/2015 1.0  0.2 - 1.0 K/uL Final  . Eosinophils Relative 10/30/2015 1  % Final  . Eosinophils Absolute 10/30/2015 0.1  0 - 0.7 K/uL Final  . Basophils Relative 10/30/2015 1  % Final  . Basophils Absolute 10/30/2015  0.1  0 - 0.1 K/uL Final  . Sodium 10/30/2015 140  135 - 145 mmol/L Final  .  Potassium 10/30/2015 4.5  3.5 - 5.1 mmol/L Final  . Chloride 10/30/2015 98* 101 - 111 mmol/L Final  . CO2 10/30/2015 33* 22 - 32 mmol/L Final  . Glucose, Bld 10/30/2015 133* 65 - 99 mg/dL Final  . BUN 10/30/2015 21* 6 - 20 mg/dL Final  . Creatinine, Ser 10/30/2015 1.12  0.61 - 1.24 mg/dL Final  . Calcium 10/30/2015 9.4  8.9 - 10.3 mg/dL Final  . GFR calc non Af Amer 10/30/2015 >60  >60 mL/min Final  . GFR calc Af Amer 10/30/2015 >60  >60 mL/min Final   Comment: (NOTE) The eGFR has been calculated using the CKD EPI equation. This calculation has not been validated in all clinical situations. eGFR's persistently <60 mL/min signify possible Chronic Kidney Disease.   . Anion gap 10/30/2015 9  5 - 15 Final    Assessment:  KAION TISDALE is a 55 y.o. 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    He was diagnosed with stage IIIA squamous cell lung cancer in 07/02/2013.  PET scan on 06/23/2013 revealed a 1.5 cm right upper lobe nodule (SUV 7.4) concerning for primary bronchogenic carcinoma.  There were hypermetabolic ipsilateral hilar and mediastinal lymph node metastasis. There was malignant range FDG uptake associated with bilateral level 2 cervical lymph nodes of uncertain significance.  EBUS assisted biopsy of a right lower paratracheal and right hilar lymph node on 07/02/2013 confirmed non-small cell carcinoma, favoring squamous cell carcinoma.  Clinical stage was T1N2M0.  He received concurrent chemotherapy and radiation.  He received 6,000 cGy from 08/05/2014 - 09/14/2013.  He received weekly carboplatin and Taxol x 6 from 08/02/2013 - 10/25/2013.  Course was complicated by a late pneumothorax secondary to port placement.  He also had a seizure in 03/2014 with subsequent fracture of lumbar vertebrae and rod placement.  PET scan on 04/12/2015 revealed mild soft tissue fullness along the posterior right upper  lobe/perihilar region, with associated mild vague hypermetabolism (SUV 2.6).  There were hypermetabolic bilateral adrenal metastases, right (2.7 cm; SUV 10) greater than left (2.1 cm; SUV 7.8).  There was small hypermetabolic upper abdominal nodal metastases (9 mm porta hepatitis (SUV 7.3) and 7 mm portacaval node (SUV 5.3)).  CT guided right adrenal biopsy on 04/25/2015 revealed metastatic adenocarcinoma of lung primary.  TTF-1 was positive.  PD-L1 testing was negative (< 1%).  EGFR revealed no mutation.  There was no ALK rearrangement.  ROS1 revealed no result (unsuccessful).  RET gene rearrangement was negative.    CEA was 36.5 on 05/25/2015, 34 on 06/15/2015, and 35.3 on 07/06/2015, 48.8 on 07/27/2015, 62.5 on 08/17/2015, and 115.6 on 09/07/2015.  He has a mild normocytic anemia.  Anemia work-up on 04/05/2015 revealed iron deficiency anemia (ferritin 48, iron saturation 12%, TIBC 310).  Sedimentation rate was 27.  B12 was 255 (low normal) with a folate 13.8 (normal).  MMA was normal (246).  TSH was 8.840 (high) with normal T4 and T3U, and free thyroxine index.   His last colonoscopy was just prior to his diagnosis.  He has never had an EGD.  He denies any melena or hematochezia.  Diet is poor.  He is on oral iron.  Echo on 08/18/2014 revealed an EF of 45%.  Echo at [facility] on 09/19/2015 revealed an EF of 50%.  He  received 5 cycles of carboplatin and Alimta  (05/25/2015 - 08/17/2015).  Chest, abdomen, and pelvic CT scan on 08/09/2015 revealed radiation changes in the right upper lobe extending to the perihilar region. There was stable mild soft tissue prominence laterally.  There were stable bilateral adrenal metastases.  There were mildly prominent upper abdominal lymph nodes measuring up to 11 mm.  Bone scan on 08/11/2015 revealed mild multifocal areas of increased activity about the cervical and lumbar spine and right shoulder, most likely degenerative.   PET scan on 09/06/2015 revealed interval  progression of nodal metastasis with new upper mediastinal hypermetabolic adenopathy, bilateral hypermetabolic lymph nodes adjacent to the tails of the parotid glands, and increased number of hypermetabolic periaortic lymph nodes.  There was stable to mildly increased bilateral adrenal metastasis.  He received 1 cycle of nivolumab (09/14/2015).  Course was complicated by colitis. Abdominal and pelvic CT scan on 09/19/2015 revealed pancolitis.  Flexible sigmoidoscopy on 09/21/2015 showed patchy nonspecific elevation sparing the rectum, worse in the sigmoid colon and up to the transverse colon.  Diarrhea and hematochezia stopped abruptly with steroids instituted on 09/19/2015.  He is on a prednisone taper (current dose 30 mg a day).  He was admitted to [facility] from 09/17/2015 - 09/26/2015 with an NSTEMI.  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 There was 50% mid LAD and mid RCA lesion.  Chest CT angiogram on 09/21/2015 confirmed bilateral pulmonary emboli. Bilateral lower extremity duplex on 09/22/2015 revealed bilateral femoral DVTs.  An IVC filter was placed.  He is on Lovenox 60 mg every 12 hours.  He was admitted to [facility] from 10/13/2015 -  10/25/2015.  He had several loose stools. CT scan revealed no definitive evidence of acute intra-abdominal process.  Flexible sigmoidoscopy on 10/20/2015 revealed colonic mucosa with epithelial injury. CMV was negative.  Stool studies were negative.  His prednisone was increased back to 50 mg a day.  He was started on Bactrim prophylaxis (MWF).   Chest x-ray revealed a new right new rounded opacity in the right midlung c/w postobstructive pneumonia versus inflammatory process.  He completed a course of azithromycin on 10/19/2015.  Head MRI on 10/22/2015 was negative.   He is on a weekly prednisone taper (Mondays decrease by 10 mg a week).  Current dose is 30 mg a day.  He has continued valacyclovir for zoster  on the thorax.  Symptomatically, he has felt good x 3 days.  He has some bruising associated with his Lovenox injections.  Plan: 1.  Labs today:  CBC with diff, BMP. 2.  Continue weekly prednisone taper on Mondays.  Call if any increase in diarrhea or blood in stools. 3.  Anticipate initiation of chemotherapy once off steroids and patient at baseline. 4.  RTC in 2 weeks for MD assessment and labs (CBC with diff, CMP, CEA).   Lequita Asal, MD  10/30/2015, 11:17 AM

## 2015-10-30 ENCOUNTER — Inpatient Hospital Stay: Payer: BLUE CROSS/BLUE SHIELD

## 2015-10-30 ENCOUNTER — Other Ambulatory Visit: Payer: Self-pay | Admitting: *Deleted

## 2015-10-30 ENCOUNTER — Other Ambulatory Visit: Payer: BLUE CROSS/BLUE SHIELD

## 2015-10-30 ENCOUNTER — Encounter: Payer: Self-pay | Admitting: Hematology and Oncology

## 2015-10-30 ENCOUNTER — Other Ambulatory Visit: Payer: Self-pay

## 2015-10-30 ENCOUNTER — Inpatient Hospital Stay: Payer: BLUE CROSS/BLUE SHIELD | Attending: Hematology and Oncology | Admitting: Hematology and Oncology

## 2015-10-30 VITALS — BP 119/79 | HR 87 | Temp 96.3°F | Resp 18 | Wt 140.0 lb

## 2015-10-30 DIAGNOSIS — C778 Secondary and unspecified malignant neoplasm of lymph nodes of multiple regions: Secondary | ICD-10-CM | POA: Diagnosis not present

## 2015-10-30 DIAGNOSIS — Z9221 Personal history of antineoplastic chemotherapy: Secondary | ICD-10-CM | POA: Diagnosis not present

## 2015-10-30 DIAGNOSIS — E569 Vitamin deficiency, unspecified: Secondary | ICD-10-CM | POA: Diagnosis not present

## 2015-10-30 DIAGNOSIS — Z923 Personal history of irradiation: Secondary | ICD-10-CM | POA: Diagnosis not present

## 2015-10-30 DIAGNOSIS — C3411 Malignant neoplasm of upper lobe, right bronchus or lung: Secondary | ICD-10-CM | POA: Diagnosis present

## 2015-10-30 DIAGNOSIS — Z79899 Other long term (current) drug therapy: Secondary | ICD-10-CM | POA: Diagnosis not present

## 2015-10-30 DIAGNOSIS — I214 Non-ST elevation (NSTEMI) myocardial infarction: Secondary | ICD-10-CM | POA: Diagnosis not present

## 2015-10-30 DIAGNOSIS — C7972 Secondary malignant neoplasm of left adrenal gland: Secondary | ICD-10-CM

## 2015-10-30 DIAGNOSIS — G8929 Other chronic pain: Secondary | ICD-10-CM | POA: Insufficient documentation

## 2015-10-30 DIAGNOSIS — C7971 Secondary malignant neoplasm of right adrenal gland: Secondary | ICD-10-CM | POA: Diagnosis not present

## 2015-10-30 DIAGNOSIS — I82413 Acute embolism and thrombosis of femoral vein, bilateral: Secondary | ICD-10-CM | POA: Diagnosis not present

## 2015-10-30 DIAGNOSIS — C7800 Secondary malignant neoplasm of unspecified lung: Secondary | ICD-10-CM | POA: Insufficient documentation

## 2015-10-30 DIAGNOSIS — R5383 Other fatigue: Secondary | ICD-10-CM

## 2015-10-30 DIAGNOSIS — Z7901 Long term (current) use of anticoagulants: Secondary | ICD-10-CM | POA: Diagnosis not present

## 2015-10-30 DIAGNOSIS — E039 Hypothyroidism, unspecified: Secondary | ICD-10-CM | POA: Diagnosis not present

## 2015-10-30 DIAGNOSIS — B029 Zoster without complications: Secondary | ICD-10-CM | POA: Diagnosis not present

## 2015-10-30 DIAGNOSIS — D509 Iron deficiency anemia, unspecified: Secondary | ICD-10-CM | POA: Diagnosis not present

## 2015-10-30 DIAGNOSIS — I509 Heart failure, unspecified: Secondary | ICD-10-CM | POA: Diagnosis not present

## 2015-10-30 DIAGNOSIS — E860 Dehydration: Secondary | ICD-10-CM

## 2015-10-30 DIAGNOSIS — C3490 Malignant neoplasm of unspecified part of unspecified bronchus or lung: Secondary | ICD-10-CM

## 2015-10-30 DIAGNOSIS — Z87891 Personal history of nicotine dependence: Secondary | ICD-10-CM | POA: Diagnosis not present

## 2015-10-30 DIAGNOSIS — K529 Noninfective gastroenteritis and colitis, unspecified: Secondary | ICD-10-CM | POA: Insufficient documentation

## 2015-10-30 DIAGNOSIS — I2699 Other pulmonary embolism without acute cor pulmonale: Secondary | ICD-10-CM | POA: Insufficient documentation

## 2015-10-30 DIAGNOSIS — C7970 Secondary malignant neoplasm of unspecified adrenal gland: Secondary | ICD-10-CM

## 2015-10-30 DIAGNOSIS — R569 Unspecified convulsions: Secondary | ICD-10-CM | POA: Diagnosis not present

## 2015-10-30 DIAGNOSIS — J449 Chronic obstructive pulmonary disease, unspecified: Secondary | ICD-10-CM | POA: Diagnosis not present

## 2015-10-30 LAB — BASIC METABOLIC PANEL
Anion gap: 9 (ref 5–15)
BUN: 21 mg/dL — ABNORMAL HIGH (ref 6–20)
CO2: 33 mmol/L — ABNORMAL HIGH (ref 22–32)
Calcium: 9.4 mg/dL (ref 8.9–10.3)
Chloride: 98 mmol/L — ABNORMAL LOW (ref 101–111)
Creatinine, Ser: 1.12 mg/dL (ref 0.61–1.24)
GFR calc Af Amer: >60 mL/min (ref >60)
GFR calc non Af Amer: >60 mL/min (ref >60)
Glucose, Bld: 133 mg/dL — ABNORMAL HIGH (ref 65–99)
Potassium: 4.5 mmol/L (ref 3.5–5.1)
Sodium: 140 mmol/L (ref 135–145)

## 2015-10-30 LAB — CBC WITH DIFFERENTIAL/PLATELET
Basophils Absolute: 0.1 10*3/uL (ref 0–0.1)
Basophils Relative: 1 %
Eosinophils Absolute: 0.1 10*3/uL (ref 0–0.7)
Eosinophils Relative: 1 %
HCT: 33.8 % — ABNORMAL LOW (ref 40.0–52.0)
Hemoglobin: 11.4 g/dL — ABNORMAL LOW (ref 13.0–18.0)
Lymphocytes Relative: 23 %
Lymphs Abs: 2.3 10*3/uL (ref 1.0–3.6)
MCH: 30.8 pg (ref 26.0–34.0)
MCHC: 33.6 g/dL (ref 32.0–36.0)
MCV: 91.7 fL (ref 80.0–100.0)
Monocytes Absolute: 1 10*3/uL (ref 0.2–1.0)
Monocytes Relative: 10 %
Neutro Abs: 6.6 10*3/uL — ABNORMAL HIGH (ref 1.4–6.5)
Neutrophils Relative %: 65 %
Platelets: 190 10*3/uL (ref 150–440)
RBC: 3.69 MIL/uL — ABNORMAL LOW (ref 4.40–5.90)
RDW: 15.8 % — ABNORMAL HIGH (ref 11.5–14.5)
WBC: 10.1 10*3/uL (ref 3.8–10.6)

## 2015-10-30 MED ORDER — OXYCODONE HCL 5 MG PO TABS: 5.0000 mg | ORAL_TABLET | ORAL | 0 refills | Status: DC | PRN

## 2015-10-30 NOTE — Progress Notes (Signed)
Patient had a colonoscopy at [facility] during last admission..  Tetanus injection last year.

## 2015-11-04 ENCOUNTER — Other Ambulatory Visit: Payer: Self-pay

## 2015-11-04 ENCOUNTER — Encounter: Payer: Self-pay | Admitting: Emergency Medicine

## 2015-11-04 ENCOUNTER — Observation Stay
Admission: EM | Admit: 2015-11-04 | Discharge: 2015-11-05 | Disposition: A | Discharge status: Home / Self Care | Payer: BLUE CROSS/BLUE SHIELD | Attending: Internal Medicine | Admitting: Internal Medicine

## 2015-11-04 ENCOUNTER — Emergency Department: Payer: BLUE CROSS/BLUE SHIELD

## 2015-11-04 ENCOUNTER — Observation Stay
Admit: 2015-11-04 | Discharge: 2015-11-04 | Disposition: A | Discharge status: Home / Self Care | Payer: BLUE CROSS/BLUE SHIELD | Attending: Internal Medicine | Admitting: Internal Medicine

## 2015-11-04 DIAGNOSIS — I081 Rheumatic disorders of both mitral and tricuspid valves: Secondary | ICD-10-CM | POA: Insufficient documentation

## 2015-11-04 DIAGNOSIS — Z923 Personal history of irradiation: Secondary | ICD-10-CM | POA: Diagnosis not present

## 2015-11-04 DIAGNOSIS — F419 Anxiety disorder, unspecified: Secondary | ICD-10-CM | POA: Diagnosis not present

## 2015-11-04 DIAGNOSIS — G893 Neoplasm related pain (acute) (chronic): Secondary | ICD-10-CM | POA: Diagnosis not present

## 2015-11-04 DIAGNOSIS — R0789 Other chest pain: Secondary | ICD-10-CM | POA: Diagnosis not present

## 2015-11-04 DIAGNOSIS — I700 Atherosclerosis of aorta: Secondary | ICD-10-CM | POA: Insufficient documentation

## 2015-11-04 DIAGNOSIS — Z87891 Personal history of nicotine dependence: Secondary | ICD-10-CM | POA: Insufficient documentation

## 2015-11-04 DIAGNOSIS — K529 Noninfective gastroenteritis and colitis, unspecified: Secondary | ICD-10-CM | POA: Diagnosis not present

## 2015-11-04 DIAGNOSIS — Z79899 Other long term (current) drug therapy: Secondary | ICD-10-CM | POA: Insufficient documentation

## 2015-11-04 DIAGNOSIS — R079 Chest pain, unspecified: Secondary | ICD-10-CM

## 2015-11-04 DIAGNOSIS — J449 Chronic obstructive pulmonary disease, unspecified: Secondary | ICD-10-CM | POA: Diagnosis not present

## 2015-11-04 DIAGNOSIS — C7971 Secondary malignant neoplasm of right adrenal gland: Secondary | ICD-10-CM | POA: Insufficient documentation

## 2015-11-04 DIAGNOSIS — R531 Weakness: Secondary | ICD-10-CM | POA: Insufficient documentation

## 2015-11-04 DIAGNOSIS — I509 Heart failure, unspecified: Secondary | ICD-10-CM | POA: Diagnosis not present

## 2015-11-04 DIAGNOSIS — E039 Hypothyroidism, unspecified: Secondary | ICD-10-CM | POA: Insufficient documentation

## 2015-11-04 DIAGNOSIS — I110 Hypertensive heart disease with heart failure: Secondary | ICD-10-CM | POA: Diagnosis not present

## 2015-11-04 DIAGNOSIS — E860 Dehydration: Secondary | ICD-10-CM | POA: Diagnosis not present

## 2015-11-04 DIAGNOSIS — E278 Other specified disorders of adrenal gland: Secondary | ICD-10-CM | POA: Diagnosis not present

## 2015-11-04 DIAGNOSIS — M5430 Sciatica, unspecified side: Secondary | ICD-10-CM | POA: Insufficient documentation

## 2015-11-04 DIAGNOSIS — K5900 Constipation, unspecified: Secondary | ICD-10-CM | POA: Diagnosis not present

## 2015-11-04 DIAGNOSIS — C3490 Malignant neoplasm of unspecified part of unspecified bronchus or lung: Secondary | ICD-10-CM | POA: Diagnosis present

## 2015-11-04 DIAGNOSIS — Z7982 Long term (current) use of aspirin: Secondary | ICD-10-CM | POA: Insufficient documentation

## 2015-11-04 DIAGNOSIS — L299 Pruritus, unspecified: Secondary | ICD-10-CM | POA: Diagnosis not present

## 2015-11-04 DIAGNOSIS — D509 Iron deficiency anemia, unspecified: Secondary | ICD-10-CM | POA: Insufficient documentation

## 2015-11-04 DIAGNOSIS — Z981 Arthrodesis status: Secondary | ICD-10-CM | POA: Insufficient documentation

## 2015-11-04 DIAGNOSIS — I2510 Atherosclerotic heart disease of native coronary artery without angina pectoris: Secondary | ICD-10-CM | POA: Diagnosis not present

## 2015-11-04 DIAGNOSIS — R1084 Generalized abdominal pain: Secondary | ICD-10-CM

## 2015-11-04 DIAGNOSIS — F0391 Unspecified dementia with behavioral disturbance: Secondary | ICD-10-CM | POA: Insufficient documentation

## 2015-11-04 DIAGNOSIS — D649 Anemia, unspecified: Secondary | ICD-10-CM

## 2015-11-04 DIAGNOSIS — I252 Old myocardial infarction: Secondary | ICD-10-CM | POA: Diagnosis not present

## 2015-11-04 DIAGNOSIS — I2699 Other pulmonary embolism without acute cor pulmonale: Secondary | ICD-10-CM | POA: Insufficient documentation

## 2015-11-04 DIAGNOSIS — A419 Sepsis, unspecified organism: Secondary | ICD-10-CM

## 2015-11-04 DIAGNOSIS — R000 Tachycardia, unspecified: Secondary | ICD-10-CM

## 2015-11-04 DIAGNOSIS — C3411 Malignant neoplasm of upper lobe, right bronchus or lung: Secondary | ICD-10-CM | POA: Insufficient documentation

## 2015-11-04 LAB — PROTIME-INR
INR: 1.13
Prothrombin Time: 14.6 seconds (ref 11.4–15.2)

## 2015-11-04 LAB — URINALYSIS COMPLETE WITH MICROSCOPIC (ARMC ONLY)
BACTERIA UA: NONE SEEN
BILIRUBIN URINE: NEGATIVE
GLUCOSE, UA: NEGATIVE mg/dL
Hgb urine dipstick: NEGATIVE
KETONES UR: NEGATIVE mg/dL
LEUKOCYTES UA: NEGATIVE
NITRITE: NEGATIVE
PH: 5 (ref 5.0–8.0)
Protein, ur: NEGATIVE mg/dL
SQUAMOUS EPITHELIAL / LPF: NONE SEEN
Specific Gravity, Urine: 1.036 — ABNORMAL HIGH (ref 1.005–1.030)

## 2015-11-04 LAB — APTT: APTT: 39 s — AB (ref 24–36)

## 2015-11-04 LAB — COMPREHENSIVE METABOLIC PANEL
ALBUMIN: 3.5 g/dL (ref 3.5–5.0)
ALT: 22 U/L (ref 17–63)
AST: 25 U/L (ref 15–41)
Alkaline Phosphatase: 86 U/L (ref 38–126)
Anion gap: 9 (ref 5–15)
BUN: 18 mg/dL (ref 6–20)
CHLORIDE: 100 mmol/L — AB (ref 101–111)
CO2: 30 mmol/L (ref 22–32)
CREATININE: 1.03 mg/dL (ref 0.61–1.24)
Calcium: 9 mg/dL (ref 8.9–10.3)
GFR calc Af Amer: >60 mL/min (ref >60)
GFR calc non Af Amer: >60 mL/min (ref >60)
GLUCOSE: 97 mg/dL (ref 65–99)
POTASSIUM: 3.8 mmol/L (ref 3.5–5.1)
SODIUM: 139 mmol/L (ref 135–145)
Total Bilirubin: 0.4 mg/dL (ref 0.3–1.2)
Total Protein: 6.6 g/dL (ref 6.5–8.1)

## 2015-11-04 LAB — TROPONIN I
TROPONIN I: 0.03 ng/mL — AB (ref <0.03)
Troponin I: 0.09 ng/mL (ref <0.03)

## 2015-11-04 LAB — ABO/RH: ABO/RH(D): O POS

## 2015-11-04 LAB — CBC WITH DIFFERENTIAL/PLATELET
BASOS PCT: 0 %
Basophils Absolute: 0 10*3/uL (ref 0–0.1)
EOS PCT: 1 %
Eosinophils Absolute: 0.1 10*3/uL (ref 0–0.7)
HCT: 23.3 % — ABNORMAL LOW (ref 40.0–52.0)
Hemoglobin: 7.6 g/dL — ABNORMAL LOW (ref 13.0–18.0)
LYMPHS ABS: 1.1 10*3/uL (ref 1.0–3.6)
Lymphocytes Relative: 20 %
MCH: 30.4 pg (ref 26.0–34.0)
MCHC: 32.7 g/dL (ref 32.0–36.0)
MCV: 93.2 fL (ref 80.0–100.0)
MONOS PCT: 10 %
Monocytes Absolute: 0.6 10*3/uL (ref 0.2–1.0)
Neutro Abs: 3.8 10*3/uL (ref 1.4–6.5)
Neutrophils Relative %: 69 %
PLATELETS: 108 10*3/uL — AB (ref 150–440)
RBC: 2.5 MIL/uL — AB (ref 4.40–5.90)
RDW: 15.8 % — ABNORMAL HIGH (ref 11.5–14.5)
WBC: 5.6 10*3/uL (ref 3.8–10.6)

## 2015-11-04 LAB — LIPASE, BLOOD: Lipase: 18 U/L (ref 11–51)

## 2015-11-04 LAB — LACTIC ACID, PLASMA: LACTIC ACID, VENOUS: 1.8 mmol/L (ref 0.5–1.9)

## 2015-11-04 LAB — PREPARE RBC (CROSSMATCH)

## 2015-11-04 MED ORDER — ASPIRIN EC 81 MG PO TBEC
81.0000 mg | DELAYED_RELEASE_TABLET | Freq: Every day | ORAL | Status: DC
  Administered 2015-11-04 – 2015-11-05 (×2): 81 mg via ORAL
  Filled 2015-11-04 (×2): qty 1

## 2015-11-04 MED ORDER — NITROGLYCERIN 0.4 MG SL SUBL: 0.4000 mg | SUBLINGUAL_TABLET | SUBLINGUAL | Status: DC | PRN

## 2015-11-04 MED ORDER — SODIUM CHLORIDE 0.9 % IV SOLN: 250.0000 mL | INTRAVENOUS | Status: DC | PRN

## 2015-11-04 MED ORDER — OXYCODONE HCL 5 MG PO TABS
5.0000 mg | ORAL_TABLET | ORAL | Status: DC | PRN
  Administered 2015-11-04 – 2015-11-05 (×3): 5 mg via ORAL
  Filled 2015-11-04 (×3): qty 1

## 2015-11-04 MED ORDER — IPRATROPIUM-ALBUTEROL 0.5-2.5 (3) MG/3ML IN SOLN
3.0000 mL | Freq: Four times a day (QID) | RESPIRATORY_TRACT | Status: DC
Start: 2015-11-04 — End: 2015-11-06
  Administered 2015-11-04 – 2015-11-05 (×4): 3 mL via RESPIRATORY_TRACT
  Filled 2015-11-04 (×5): qty 3

## 2015-11-04 MED ORDER — LEVOTHYROXINE SODIUM 88 MCG PO TABS
88.0000 ug | ORAL_TABLET | Freq: Every day | ORAL | Status: DC
  Administered 2015-11-05: 08:00:00 88 ug via ORAL
  Filled 2015-11-04: qty 1

## 2015-11-04 MED ORDER — SODIUM CHLORIDE 0.9 % IV BOLUS (SEPSIS)
1000.0000 mL | Freq: Once | INTRAVENOUS | Status: AC
  Administered 2015-11-04: 1000 mL via INTRAVENOUS

## 2015-11-04 MED ORDER — PREDNISONE 20 MG PO TABS
20.0000 mg | ORAL_TABLET | Freq: Every day | ORAL | Status: DC
  Filled 2015-11-04: qty 1

## 2015-11-04 MED ORDER — FOLIC ACID 1 MG PO TABS
1.0000 mg | ORAL_TABLET | Freq: Every day | ORAL | Status: DC
  Administered 2015-11-04 – 2015-11-05 (×2): 1 mg via ORAL
  Filled 2015-11-04 (×2): qty 1

## 2015-11-04 MED ORDER — SODIUM CHLORIDE 0.9 % IV BOLUS (SEPSIS): 890.0000 mL | Freq: Once | INTRAVENOUS | Status: DC

## 2015-11-04 MED ORDER — BISACODYL 10 MG RE SUPP: 10.0000 mg | Freq: Every day | RECTAL | Status: DC | PRN

## 2015-11-04 MED ORDER — ONDANSETRON HCL 4 MG/2ML IJ SOLN: 4.0000 mg | Freq: Four times a day (QID) | INTRAMUSCULAR | Status: DC | PRN

## 2015-11-04 MED ORDER — SULFAMETHOXAZOLE-TRIMETHOPRIM 800-160 MG PO TABS
1.0000 | ORAL_TABLET | Freq: Once | ORAL | Status: AC
  Administered 2015-11-04: 1 via ORAL
  Filled 2015-11-04: qty 1

## 2015-11-04 MED ORDER — GABAPENTIN 100 MG PO CAPS
100.0000 mg | ORAL_CAPSULE | Freq: Three times a day (TID) | ORAL | Status: DC
  Administered 2015-11-04 – 2015-11-05 (×3): 100 mg via ORAL
  Filled 2015-11-04 (×3): qty 1

## 2015-11-04 MED ORDER — ONDANSETRON HCL 4 MG/2ML IJ SOLN
4.0000 mg | Freq: Once | INTRAMUSCULAR | Status: AC
  Administered 2015-11-04: 4 mg via INTRAVENOUS
  Filled 2015-11-04: qty 2

## 2015-11-04 MED ORDER — ACETAMINOPHEN 650 MG RE SUPP: 650.0000 mg | Freq: Four times a day (QID) | RECTAL | Status: DC | PRN

## 2015-11-04 MED ORDER — DOCUSATE SODIUM 100 MG PO CAPS
100.0000 mg | ORAL_CAPSULE | Freq: Two times a day (BID) | ORAL | Status: DC
  Administered 2015-11-04 – 2015-11-05 (×3): 100 mg via ORAL
  Filled 2015-11-04 (×3): qty 1

## 2015-11-04 MED ORDER — ONDANSETRON HCL 4 MG PO TABS
4.0000 mg | ORAL_TABLET | Freq: Four times a day (QID) | ORAL | Status: DC | PRN
  Administered 2015-11-05: 11:00:00 4 mg via ORAL
  Filled 2015-11-04: qty 1

## 2015-11-04 MED ORDER — HYDROXYZINE HCL 10 MG PO TABS
10.0000 mg | ORAL_TABLET | Freq: Three times a day (TID) | ORAL | Status: DC | PRN
  Administered 2015-11-05 (×2): 10 mg via ORAL
  Filled 2015-11-04 (×2): qty 1

## 2015-11-04 MED ORDER — PANTOPRAZOLE SODIUM 40 MG PO TBEC
40.0000 mg | DELAYED_RELEASE_TABLET | Freq: Every day | ORAL | Status: DC
  Administered 2015-11-04 – 2015-11-05 (×2): 40 mg via ORAL
  Filled 2015-11-04 (×2): qty 1

## 2015-11-04 MED ORDER — ENOXAPARIN SODIUM 40 MG/0.4ML ~~LOC~~ SOLN
40.0000 mg | SUBCUTANEOUS | Status: DC
  Administered 2015-11-04: 21:00:00 40 mg via SUBCUTANEOUS
  Filled 2015-11-04: qty 0.4

## 2015-11-04 MED ORDER — METOCLOPRAMIDE HCL 10 MG PO TABS: 10.0000 mg | ORAL_TABLET | Freq: Four times a day (QID) | ORAL | Status: DC | PRN

## 2015-11-04 MED ORDER — HYDROMORPHONE HCL 1 MG/ML IJ SOLN
0.5000 mg | Freq: Once | INTRAMUSCULAR | Status: AC
  Administered 2015-11-04: 0.5 mg via INTRAVENOUS
  Filled 2015-11-04: qty 1

## 2015-11-04 MED ORDER — DIPHENHYDRAMINE HCL 50 MG/ML IJ SOLN
12.5000 mg | Freq: Four times a day (QID) | INTRAMUSCULAR | Status: DC | PRN
  Administered 2015-11-04 – 2015-11-05 (×2): 12.5 mg via INTRAVENOUS
  Filled 2015-11-04 (×2): qty 1

## 2015-11-04 MED ORDER — SODIUM CHLORIDE 0.9% FLUSH
3.0000 mL | Freq: Two times a day (BID) | INTRAVENOUS | Status: DC
  Administered 2015-11-04 – 2015-11-05 (×3): 3 mL via INTRAVENOUS

## 2015-11-04 MED ORDER — IOPAMIDOL (ISOVUE-370) INJECTION 76%
100.0000 mL | Freq: Once | INTRAVENOUS | Status: AC | PRN
  Administered 2015-11-04: 100 mL via INTRAVENOUS

## 2015-11-04 MED ORDER — ACETAMINOPHEN 325 MG PO TABS: 650.0000 mg | ORAL_TABLET | Freq: Four times a day (QID) | ORAL | Status: DC | PRN

## 2015-11-04 MED ORDER — DULOXETINE HCL 60 MG PO CPEP
60.0000 mg | ORAL_CAPSULE | Freq: Every day | ORAL | Status: DC
  Administered 2015-11-04 – 2015-11-05 (×2): 60 mg via ORAL
  Filled 2015-11-04 (×2): qty 1

## 2015-11-04 MED ORDER — QUETIAPINE FUMARATE 25 MG PO TABS
50.0000 mg | ORAL_TABLET | Freq: Every day | ORAL | Status: DC
  Administered 2015-11-04: 14:00:00 50 mg via ORAL
  Filled 2015-11-04: qty 2

## 2015-11-04 MED ORDER — VANCOMYCIN HCL IN DEXTROSE 1-5 GM/200ML-% IV SOLN
1000.0000 mg | Freq: Once | INTRAVENOUS | Status: AC
  Administered 2015-11-04: 1000 mg via INTRAVENOUS
  Filled 2015-11-04: qty 200

## 2015-11-04 MED ORDER — METOPROLOL SUCCINATE ER 25 MG PO TB24
25.0000 mg | ORAL_TABLET | Freq: Two times a day (BID) | ORAL | Status: DC
  Administered 2015-11-04 – 2015-11-05 (×3): 25 mg via ORAL
  Filled 2015-11-04 (×3): qty 1

## 2015-11-04 MED ORDER — ATORVASTATIN CALCIUM 80 MG PO TABS
80.0000 mg | ORAL_TABLET | Freq: Every evening | ORAL | Status: DC
  Administered 2015-11-04: 80 mg via ORAL
  Filled 2015-11-04 (×2): qty 1

## 2015-11-04 MED ORDER — SODIUM CHLORIDE 0.9% FLUSH
3.0000 mL | Freq: Two times a day (BID) | INTRAVENOUS | Status: DC
  Administered 2015-11-04 – 2015-11-05 (×3): 3 mL via INTRAVENOUS

## 2015-11-04 MED ORDER — HYDROMORPHONE HCL 1 MG/ML IJ SOLN
1.0000 mg | INTRAMUSCULAR | Status: DC | PRN
  Administered 2015-11-04 – 2015-11-05 (×7): 1 mg via INTRAVENOUS
  Filled 2015-11-04 (×7): qty 1

## 2015-11-04 MED ORDER — SODIUM CHLORIDE 0.9% FLUSH: 3.0000 mL | INTRAVENOUS | Status: DC | PRN

## 2015-11-04 MED ORDER — PIPERACILLIN-TAZOBACTAM 3.375 G IVPB 30 MIN
3.3750 g | Freq: Once | INTRAVENOUS | Status: AC
  Administered 2015-11-04: 3.375 g via INTRAVENOUS
  Filled 2015-11-04: qty 50

## 2015-11-04 MED ORDER — SODIUM CHLORIDE 0.9 % IV SOLN
10.0000 mL/h | Freq: Once | INTRAVENOUS | Status: AC
  Administered 2015-11-04: 10 mL/h via INTRAVENOUS

## 2015-11-04 MED ORDER — GUAIFENESIN-DM 100-10 MG/5ML PO SYRP
5.0000 mL | ORAL_SOLUTION | ORAL | Status: DC | PRN
  Administered 2015-11-04 – 2015-11-05 (×3): 5 mL via ORAL
  Filled 2015-11-04 (×3): qty 5

## 2015-11-04 MED ORDER — VALACYCLOVIR HCL 500 MG PO TABS
500.0000 mg | ORAL_TABLET | Freq: Every day | ORAL | Status: DC
  Administered 2015-11-04 – 2015-11-05 (×2): 500 mg via ORAL
  Filled 2015-11-04 (×2): qty 1

## 2015-11-04 MED ORDER — HYDROMORPHONE HCL 1 MG/ML IJ SOLN
1.0000 mg | Freq: Once | INTRAMUSCULAR | Status: AC
  Administered 2015-11-04: 1 mg via INTRAVENOUS
  Filled 2015-11-04: qty 1

## 2015-11-04 MED ORDER — LORAZEPAM 0.5 MG PO TABS
0.5000 mg | ORAL_TABLET | Freq: Three times a day (TID) | ORAL | Status: DC | PRN
  Administered 2015-11-05: 07:00:00 0.5 mg via ORAL
  Filled 2015-11-04: qty 1

## 2015-11-04 NOTE — H&P (Signed)
History and Physical    [name] SFK:[id number] DOB: 12-27-1960 DOA: 11/04/2015  Referring physician: Dr. Cinda [facility] PCP: [facility]  Specialists: Dr. Mike [facility]  Chief Complaint: CP and SOB  HPI: Darryl [facility]field is a 55 y.o. male has a past medical history significant for metastatic lung CA, ASCVD, CHF, and COPD. Presents now with progressive CP and SOB. In the ER, pt found to be anemic. CT chest/abd/pelvis stable except for progressive tumor burden. He is tachycardic and continues to [place]/o CP and SOB despite IV dilaudid. He is now admitted.  Review of Systems: The patient denies anorexia, fever, weight loss,, vision loss, decreased hearing, hoarseness, syncope,  peripheral edema, balance deficits, hemoptysis, abdominal pain, melena, hematochezia, severe indigestion/heartburn, hematuria, incontinence, genital sores, muscle weakness, suspicious skin lesions, transient blindness, difficulty walking, depression, unusual weight change, abnormal bleeding, enlarged lymph nodes, angioedema, and breast masses.   Past Medical History:  Diagnosis Date  . CHF (congestive heart failure) ([place])   . COPD (chronic obstructive pulmonary disease) ([place])   . Hypothyroidism   . Lung cancer ([place]) 2015   RIght   . Seizures ([place])    Past Surgical History:  Procedure Laterality Date  . BACK SURGERY    . COLONOSCOPY  10/2015  . PORTACATH PLACEMENT Right    Social History:  reports that he quit smoking about 2 years ago. He has a 40.00 pack-year smoking history. He has never used smokeless tobacco. He reports that he does not drink alcohol or use drugs.  Allergies  Allergen Reactions  . No Known Allergies     Family History  Problem Relation Age of Onset  . Hypertension Other   . Heart failure Other   . Cancer Father     Prior to Admission medications   Medication Sig Start Date End Date Taking? Authorizing Provider  acetaminophen (TYLENOL) 325 MG tablet Take 650 mg by mouth every  6 (six) hours as needed for mild pain.  04/21/14  Yes Historical Provider, MD  albuterol (PROVENTIL) (2.5 MG/3ML) 0.083% nebulizer solution Take 3 mLs (2.5 mg total) by nebulization every 6 (six) hours as needed for wheezing or shortness of breath. Patient taking differently: Take 2.5 mg by nebulization 2 (two) times daily.  02/03/15  Yes Dmitriy Berenzon, MD  aspirin 81 MG EC tablet Take 81 mg by mouth daily.  09/25/15  Yes Historical Provider, MD  Calcium Carbonate-Vitamin D 600-400 MG-UNIT tablet Take 1 tablet by mouth 2 (two) times daily.  09/26/15 09/25/16 Yes Historical Provider, MD  DULoxetine (CYMBALTA) 30 MG capsule Take 60 mg by mouth daily.    Yes Historical Provider, MD  enoxaparin (LOVENOX) 60 MG/0.6ML injection Inject 0.6 mLs (60 mg total) into the skin every 12 (twelve) hours. 09/28/15 11/04/15 Yes Darryl Asal, MD  folic acid (FOLVITE) 1 MG tablet TAKE 1 TABLET BY MOUTH DAILY START 5-7 DAYS BEFORE ALIMTA CHEMO, CONTINUE UNTIL 21 DAYS AFTER ALIMTA 09/13/15  Yes Darryl Asal, MD  gabapentin (NEURONTIN) 100 MG capsule Take 1 capsule (100 mg total) by mouth 3 (three) times daily. 03/15/15  Yes Dmitriy Berenzon, MD  hydrOXYzine (ATARAX/VISTARIL) 10 MG tablet Take 1 tablet (10 mg total) by mouth 3 (three) times daily as needed for itching. 10/05/15  Yes Darryl Asal, MD  levothyroxine (SYNTHROID, LEVOTHROID) 88 MCG tablet Take 1 tablet (88 mcg total) by mouth daily before breakfast. Patient taking differently: Take 44 mcg by mouth daily before breakfast.  06/16/15  Yes Darryl [facility]  Darryl Gip, MD  LORazepam (ATIVAN) 0.5 MG tablet Take 1 tablet (0.5 mg total) by mouth every 8 (eight) hours as needed for anxiety. 06/16/15  Yes Darryl Asal, MD  metoCLOPramide (REGLAN) 10 MG tablet Take 1 tablet (10 mg total) by mouth every 6 (six) hours as needed. 09/29/15  Yes Darryl Mew, MD  metoprolol succinate (TOPROL-XL) 25 MG 24 hr tablet Take 25 mg by mouth 2 (two) times daily.  09/26/15  Yes  Historical Provider, MD  nitroGLYCERIN (NITROSTAT) 0.4 MG SL tablet Place 0.4 mg under the tongue every 5 (five) minutes as needed.  09/25/15  Yes Historical Provider, MD  ondansetron (ZOFRAN) 8 MG tablet Take 1 tablet (8 mg total) by mouth every 8 (eight) hours as needed for nausea or vomiting. 09/07/15  Yes Darryl Asal, MD  oxyCODONE (OXY IR/ROXICODONE) 5 MG immediate release tablet Take 1 tablet (5 mg total) by mouth every 4 (four) hours as needed for severe pain. 10/30/15 11/29/15 Yes Darryl Asal, MD  predniSONE (DELTASONE) 10 MG tablet 10/5-10/8 - take 4 tabs; 10/9-10/15 - take 3 tabs; 10/16-10/22 - take 2 tabs; 10/23-10/29 - take 1 tabs 10/25/15  Yes Historical Provider, MD  QUEtiapine (SEROQUEL) 50 MG tablet Take 50 mg by mouth daily.  07/18/15 01/14/16 Yes Historical Provider, MD  sulfamethoxazole-trimethoprim (BACTRIM DS,SEPTRA DS) 800-160 MG tablet Take 1 tablet by mouth once. Monday, Wednesday, and Friday for 10 days 10/25/15 11/05/15 Yes Historical Provider, MD  valACYclovir (VALTREX) 500 MG tablet Take 500 mg by mouth daily.  10/11/15  Yes Historical Provider, MD  aluminum-magnesium hydroxide-simethicone (MAALOX) 696-295-28 MG/5ML SUSP Take 30 mLs by mouth 4 (four) times daily -  before meals and at bedtime. Patient not taking: Reported on 11/04/2015 09/29/15   Darryl Mew, MD  atorvastatin (LIPITOR) 80 MG tablet Take 80 mg by mouth daily.  09/25/15 10/25/15  Historical Provider, MD  pantoprazole (PROTONIX) 40 MG tablet Take 40 mg by mouth daily.  09/25/15 10/25/15  Historical Provider, MD  simethicone (MYLICON) 80 MG chewable tablet Chew 80 mg by mouth daily.  09/25/15 10/05/15  Historical Provider, MD   Physical Exam: Vitals:   11/04/15 0539 11/04/15 0630 11/04/15 0700 11/04/15 0900  BP: (!) 140/93 (!) 116/91 116/84 117/83  Pulse: (!) 132 (!) 133 (!) 122 (!) 122  Resp: (!) '28 15 14 '$ (!) 25  Temp: 98 F (36.7 [place]) 98.8 F (37.1 [place])    TempSrc: Oral Oral    SpO2:  99% 100% 99%   Weight:      Height:         General:  No apparent distress, very anxious, WDWN, [place]/AT  Eyes: PERRL, EOMI, no scleral icterus, conjunctiva clear  ENT: moist oropharynx without exudate, TM's benign, dentition fair  Neck: supple, no lymphadenopathy. No bruits or thyromegaly  Cardiovascular: rapid rate with regular with 2/6 SEM noted. No rubs or gallops; 2+ peripheral pulses, no JVD, no peripheral edema  Respiratory: diffuse rhonchi without wheezes or rales. Respiratory effort increased  Abdomen: soft, non tender to palpation, positive bowel sounds, no guarding, no rebound  Skin: no rashes or lesions  Musculoskeletal: normal bulk and tone, no joint swelling  Psychiatric: normal mood and affect, A&OX3  Neurologic: CN 2-12 grossly intact, Motor strength 5/5 in all 4 groups with symmetric DTR's and non-focal sensory exam  Labs on Admission:  Basic Metabolic Panel:  Recent Labs Lab 10/30/15 1020 11/04/15 0549  NA 140 139  K 4.5 3.8  CL 98* 100*  CO2 33* 30  GLUCOSE 133* 97  BUN 21* 18  CREATININE 1.12 1.03  CALCIUM 9.4 9.0   Liver Function Tests:  Recent Labs Lab 11/04/15 0549  AST 25  ALT 22  ALKPHOS 86  BILITOT 0.4  PROT 6.6  ALBUMIN 3.5    Recent Labs Lab 11/04/15 0549  LIPASE 18   No results for input(s): AMMONIA in the last 168 hours. CBC:  Recent Labs Lab 10/30/15 1020 11/04/15 0549  WBC 10.1 5.6  NEUTROABS 6.6* 3.8  HGB 11.4* 7.6*  HCT 33.8* 23.3*  MCV 91.7 93.2  PLT 190 108*   Cardiac Enzymes:  Recent Labs Lab 11/04/15 0548  TROPONINI 0.03*    BNP (last 3 results)  Recent Labs  09/16/15 1917  BNP 97.0    ProBNP (last 3 results) No results for input(s): PROBNP in the last 8760 hours.  CBG: No results for input(s): GLUCAP in the last 168 hours.  Radiological Exams on Admission: Ct Angio Chest Pe W And/or Wo Contrast  Result Date: 11/04/2015 CLINICAL DATA:  Lung cancer with metastatic spread. Chest and  abdominal/pelvic pain all week with worsening today. EXAM: CT ANGIOGRAPHY CHEST CT ABDOMEN AND PELVIS WITH CONTRAST TECHNIQUE: Multidetector CT imaging of the chest was performed using the standard protocol during bolus administration of intravenous contrast. Multiplanar CT image reconstructions and MIPs were obtained to evaluate the vascular anatomy. Multidetector CT imaging of the abdomen and pelvis was performed using the standard protocol during bolus administration of intravenous contrast. CONTRAST:  100 cc Isovue 370 COMPARISON:  PET-CT 09/06/2015.  Diagnostic CT 08/09/2015. FINDINGS: CTA CHEST FINDINGS Cardiovascular: The heart size is normal. No pericardial effusion. No thoracic aortic aneurysm no filling defects in the opacified pulmonary arteries to suggest the presence of an acute pulmonary embolus Mediastinum/Nodes: Interval increase in irregular soft tissue associated with the right hilum. 12 mm short axis AP window lymph node on image 37 series 5 was 8 mm on the previous PET-CT 6 mm short axis prevascular lymph node today was 3 mm previously. 6 mm high right paratracheal lymph node (image 15) was 4 mm previously. 8 mm prevascular lymph node on image 18 was 4 mm previously. The esophagus has normal imaging features. Small right axillary and subpectoral lymph nodes have progressed in the interval. Left supraclavicular lymphadenopathy is progressed. 11 mm left supraclavicular lymph node today was 7 mm previously. Lungs/Pleura: Emphysema. Airspace consolidation medial right lung compatible with post radiation change. 6 mm right lower lobe nodule on image 64 is more prominent in the interval. Scarring noted in the upper lungs bilaterally. Musculoskeletal: Bone windows reveal no worrisome lytic or sclerotic osseous lesions. Review of the MIP images confirms the above findings. CT ABDOMEN and PELVIS FINDINGS Hepatobiliary: No focal abnormality within the liver parenchyma. There is no evidence for gallstones,  gallbladder wall thickening, or pericholecystic fluid. No intrahepatic or extrahepatic biliary dilation. Pancreas: No focal mass lesion. No dilatation of the main duct. No intraparenchymal cyst. No peripancreatic edema. Spleen: No splenomegaly. No focal mass lesion. Adrenals/Urinary Tract: No substantial change right adrenal lesion. 2.6 cm left adrenal nodule today measured 2.3 cm previously. Tiny hypo attenuating renal lesions are too small to characterize but likely represent cysts. No evidence for hydroureter. The urinary bladder appears normal for the degree of distention. Stomach/Bowel: Stomach is nondistended. No gastric wall thickening. No evidence of outlet obstruction. Duodenum is normally positioned as is the ligament of Treitz. No small bowel wall thickening. No small bowel dilatation. The terminal  ileum is normal. The appendix is normal. No gross colonic mass. No colonic wall thickening. No substantial diverticular change. Vascular/Lymphatic: There is abdominal aortic atherosclerosis without aneurysm. IVC filter visualized in situ. Retroperitoneal lymphadenopathy is somewhat obscured by streak artifact from the patient's spinal hardware. No pelvic sidewall lymphadenopathy. Reproductive: The prostate gland and seminal vesicles have normal imaging features. Other: No intraperitoneal free fluid. Musculoskeletal: Status post lumbar fusion. Review of the MIP images confirms the above findings. IMPRESSION: 1. No CT evidence for acute pulmonary embolus. 2. Interval progression of mediastinal, right axillary/subpectoral, and left supraclavicular lymphadenopathy. Increasing a amorphous soft tissue identified in the right hilum, also suspicious for disease progression. 3. 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 4. Retroperitoneal lymphadenopathy appears more prominent, but is difficult to discretely measure given streak artifact from the spinal hardware. Electronically Signed   By: Misty Stanley M.D.    On: 11/04/2015 08:22   Ct Abdomen Pelvis W Contrast  Result Date: 11/04/2015 CLINICAL DATA:  Lung cancer with metastatic spread. Chest and abdominal/pelvic pain all week with worsening today. EXAM: CT ANGIOGRAPHY CHEST CT ABDOMEN AND PELVIS WITH CONTRAST TECHNIQUE: Multidetector CT imaging of the chest was performed using the standard protocol during bolus administration of intravenous contrast. Multiplanar CT image reconstructions and MIPs were obtained to evaluate the vascular anatomy. Multidetector CT imaging of the abdomen and pelvis was performed using the standard protocol during bolus administration of intravenous contrast. CONTRAST:  100 cc Isovue 370 COMPARISON:  PET-CT 09/06/2015.  Diagnostic CT 08/09/2015. FINDINGS: CTA CHEST FINDINGS Cardiovascular: The heart size is normal. No pericardial effusion. No thoracic aortic aneurysm no filling defects in the opacified pulmonary arteries to suggest the presence of an acute pulmonary embolus Mediastinum/Nodes: Interval increase in irregular soft tissue associated with the right hilum. 12 mm short axis AP window lymph node on image 37 series 5 was 8 mm on the previous PET-CT 6 mm short axis prevascular lymph node today was 3 mm previously. 6 mm high right paratracheal lymph node (image 15) was 4 mm previously. 8 mm prevascular lymph node on image 18 was 4 mm previously. The esophagus has normal imaging features. Small right axillary and subpectoral lymph nodes have progressed in the interval. Left supraclavicular lymphadenopathy is progressed. 11 mm left supraclavicular lymph node today was 7 mm previously. Lungs/Pleura: Emphysema. Airspace consolidation medial right lung compatible with post radiation change. 6 mm right lower lobe nodule on image 64 is more prominent in the interval. Scarring noted in the upper lungs bilaterally. Musculoskeletal: Bone windows reveal no worrisome lytic or sclerotic osseous lesions. Review of the MIP images confirms the  above findings. CT ABDOMEN and PELVIS FINDINGS Hepatobiliary: No focal abnormality within the liver parenchyma. There is no evidence for gallstones, gallbladder wall thickening, or pericholecystic fluid. No intrahepatic or extrahepatic biliary dilation. Pancreas: No focal mass lesion. No dilatation of the main duct. No intraparenchymal cyst. No peripancreatic edema. Spleen: No splenomegaly. No focal mass lesion. Adrenals/Urinary Tract: No substantial change right adrenal lesion. 2.6 cm left adrenal nodule today measured 2.3 cm previously. Tiny hypo attenuating renal lesions are too small to characterize but likely represent cysts. No evidence for hydroureter. The urinary bladder appears normal for the degree of distention. Stomach/Bowel: Stomach is nondistended. No gastric wall thickening. No evidence of outlet obstruction. Duodenum is normally positioned as is the ligament of Treitz. No small bowel wall thickening. No small bowel dilatation. The terminal ileum is normal. The appendix is normal. No gross colonic mass. No colonic  wall thickening. No substantial diverticular change. Vascular/Lymphatic: There is abdominal aortic atherosclerosis without aneurysm. IVC filter visualized in situ. Retroperitoneal lymphadenopathy is somewhat obscured by streak artifact from the patient's spinal hardware. No pelvic sidewall lymphadenopathy. Reproductive: The prostate gland and seminal vesicles have normal imaging features. Other: No intraperitoneal free fluid. Musculoskeletal: Status post lumbar fusion. Review of the MIP images confirms the above findings. IMPRESSION: 1. No CT evidence for acute pulmonary embolus. 2. Interval progression of mediastinal, right axillary/subpectoral, and left supraclavicular lymphadenopathy. Increasing a amorphous soft tissue identified in the right hilum, also suspicious for disease progression. 3. 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 4. Retroperitoneal lymphadenopathy appears  more prominent, but is difficult to discretely measure given streak artifact from the spinal hardware. Electronically Signed   By: Misty Stanley M.D.   On: 11/04/2015 08:22   Dg Chest Port 1 View  Result Date: 11/04/2015 CLINICAL DATA:  Left-sided chest pain. Pain for 1 week, worse tonight. EXAM: PORTABLE CHEST 1 VIEW COMPARISON:  Radiographs 10/13/2015, PET-CT 09/06/2015 FINDINGS: Tip of the right chest port in the SVC. Unchanged heart size and mediastinal contours. Stable streaky right midlung zone opacity most consistent with postradiation change. Resolved adjacent consolidation. No new focal airspace disease. No pleural fluid, pulmonary edema or pneumothorax. IMPRESSION: Resolved right lung opacity adjacent to postradiation change. No new abnormality is seen. Electronically Signed   By: Jeb Levering M.D.   On: 11/04/2015 06:09    EKG: Independently reviewed.  Assessment/Plan Principal Problem:   Symptomatic anemia Active Problems:   Adenocarcinoma of lung, stage 4 (HCC)   Chest pain   Tachycardia   Will observe on floor with tele. Consult Oncology and Cardiology. Transfuse 2u PRBC's. Follow enzymes. Order echo. Repeat labs in AM. Supplement O2 as needed. IV dilaudid for pain as needed.  Diet: clear liquid Fluids: saline lock DVT Prophylaxis: SQ Heparin  Code Status: FULL  Family Communication: yes  Disposition Plan: home  Time spent: 50 min

## 2015-11-04 NOTE — ED Provider Notes (Signed)
Study Result   CLINICAL DATA:  Lung cancer with metastatic spread. Chest and abdominal/pelvic pain all week with worsening today.  EXAM: CT ANGIOGRAPHY CHEST  CT ABDOMEN AND PELVIS WITH CONTRAST  TECHNIQUE: Multidetector CT imaging of the chest was performed using the standard protocol during bolus administration of intravenous contrast. Multiplanar CT image reconstructions and MIPs were obtained to evaluate the vascular anatomy. Multidetector CT imaging of the abdomen and pelvis was performed using the standard protocol during bolus administration of intravenous contrast.  CONTRAST:  100 cc Isovue 370  COMPARISON:  PET-CT 09/06/2015.  Diagnostic CT 08/09/2015.  FINDINGS: CTA CHEST FINDINGS  Cardiovascular: The heart size is normal. No pericardial effusion. No thoracic aortic aneurysm no filling defects in the opacified pulmonary arteries to suggest the presence of an acute pulmonary embolus  Mediastinum/Nodes: Interval increase in irregular soft tissue associated with the right hilum. 12 mm short axis AP window lymph node on image 37 series 5 was 8 mm on the previous PET-CT 6 mm short axis prevascular lymph node today was 3 mm previously. 6 mm high right paratracheal lymph node (image 15) was 4 mm previously. 8 mm prevascular lymph node on image 18 was 4 mm previously. The esophagus has normal imaging features. Small right axillary and subpectoral lymph nodes have progressed in the interval. Left supraclavicular lymphadenopathy is progressed. 11 mm left supraclavicular lymph node today was 7 mm previously.  Lungs/Pleura: Emphysema. Airspace consolidation medial right lung compatible with post radiation change. 6 mm right lower lobe nodule on image 64 is more prominent in the interval. Scarring noted in the upper lungs bilaterally.  Musculoskeletal: Bone windows reveal no worrisome lytic or sclerotic osseous lesions.  Review of the MIP images confirms the  above findings.  CT ABDOMEN and PELVIS FINDINGS  Hepatobiliary: No focal abnormality within the liver parenchyma. There is no evidence for gallstones, gallbladder wall thickening, or pericholecystic fluid. No intrahepatic or extrahepatic biliary dilation.  Pancreas: No focal mass lesion. No dilatation of the main duct. No intraparenchymal cyst. No peripancreatic edema.  Spleen: No splenomegaly. No focal mass lesion.  Adrenals/Urinary Tract: No substantial change right adrenal lesion. 2.6 cm left adrenal nodule today measured 2.3 cm previously. Tiny hypo attenuating renal lesions are too small to characterize but likely represent cysts. No evidence for hydroureter. The urinary bladder appears normal for the degree of distention.  Stomach/Bowel: Stomach is nondistended. No gastric wall thickening. No evidence of outlet obstruction. Duodenum is normally positioned as is the ligament of Treitz. No small bowel wall thickening. No small bowel dilatation. The terminal ileum is normal. The appendix is normal. No gross colonic mass. No colonic wall thickening. No substantial diverticular change.  Vascular/Lymphatic: There is abdominal aortic atherosclerosis without aneurysm. IVC filter visualized in situ. Retroperitoneal lymphadenopathy is somewhat obscured by streak artifact from the patient's spinal hardware. No pelvic sidewall lymphadenopathy.  Reproductive: The prostate gland and seminal vesicles have normal imaging features.  Other: No intraperitoneal free fluid.  Musculoskeletal: Status post lumbar fusion.  Review of the MIP images confirms the above findings.  IMPRESSION: 1. No CT evidence for acute pulmonary embolus. 2. Interval progression of mediastinal, right axillary/subpectoral, and left supraclavicular lymphadenopathy. Increasing a amorphous soft tissue identified in the right hilum, also suspicious for disease progression. 3. Bilateral adrenal  metastases, progressed on the left. 4. Retroperitoneal lymphadenopathy appears more prominent, but is difficult to discretely measure given streak artifact from the spinal hardware.   Electronically Signed   By: Verda Cumins.D.  On: 11/04/2015 08:22   I am unsure of the etiology of the patient's problems. Urine is still pending. I will talk to the hospitalist about admitting this patient for further diagnostic work and treatment.   Nena Polio, MD 11/04/15 0830

## 2015-11-04 NOTE — ED Notes (Signed)
Patient return from [facility] with RN. Patient tolerated exam well.

## 2015-11-04 NOTE — ED Notes (Signed)
Blood Transfusion consent completed, patient departing to CT with RN

## 2015-11-04 NOTE — Care Management Note (Signed)
Case Management Note  Patient Details  Name: Darryl Hatfield MRN: [id number] Date of Birth: Feb 18, 1960  Subjective/Objective:        Mr Darryl Hatfield was admitted to an Observation bed today per abdominal and pelvic pain. Hx: Metastatic lung cancer. Currently on 2L N/C but no home oxygen per his daughter. Dr Margart Sickles stated that he does not feel like talking to anyone. Case Management will follow for anticipated discharge to home. Per chart Mr Darryl [facility]field was an inpatient at [facility] from 10/13/15 through 10/25/15.            Action/Plan:   Expected Discharge Date:                  Expected Discharge Plan:     In-House Referral:     Discharge planning Services     Post Acute Care Choice:    Choice offered to:     DME Arranged:    DME Agency:     HH Arranged:    HH Agency:     Status of Service:     If discussed at [employer] of Stay Meetings, dates discussed:    Additional Comments:  Catalena Stanhope A, RN 11/04/2015, 2:08 PM

## 2015-11-04 NOTE — Consult Note (Signed)
[facility] Cardiology  CARDIOLOGY CONSULT NOTE  Patient ID: Darryl Hatfield MRN: [id number] DOB/AGE: [birth year] 55 y.o.  Admit date: 11/04/2015 Referring Physician [place] Primary Physician [facility] Primary Cardiologist [facility] Reason for Consultation Chest pain  HPI: 55 year old gentleman referred for evaluation of chest pain. The patient has known metastatic lung cancer. He underwent recent cardiac catheterization on 09/18/2015 which revealed insignificant coronary artery disease with 50% stenosis mid LAD and RCA. He presents to [facility] emergency room with one-week history of pleuritic type chest pain, left-sided, sharp in nature, worsens with deep breaths or cough, with shortness of breath. Admission labs notable for a 0.03, with hemoglobin and hematocrit of 7.6 and 23.3, respectively. Chest pain partially resolved with intravenous Dilaudid.  Review of systems complete and found to be negative unless listed above     Past Medical History:  Diagnosis Date  . CHF (congestive heart failure) ([place])   . COPD (chronic obstructive pulmonary disease) ([place])   . Hypothyroidism   . Lung cancer ([place]) 2015   RIght   . Seizures ([place])     Past Surgical History:  Procedure Laterality Date  . BACK SURGERY    . COLONOSCOPY  10/2015  . PORTACATH PLACEMENT Right     Prescriptions Prior to Admission  Medication Sig Dispense Refill Last Dose  . acetaminophen (TYLENOL) 325 MG tablet Take 650 mg by mouth every 6 (six) hours as needed for mild pain.    Past Week at Unknown time  . albuterol (PROVENTIL) (2.5 MG/3ML) 0.083% nebulizer solution Take 3 mLs (2.5 mg total) by nebulization every 6 (six) hours as needed for wheezing or shortness of breath. (Patient taking differently: Take 2.5 mg by nebulization 2 (two) times daily. ) 75 mL 2 Past Week at Unknown time  . aspirin 81 MG EC tablet Take 81 mg by mouth daily.   0 11/03/2015 at 0900  . Calcium Carbonate-Vitamin D 600-400 MG-UNIT tablet Take 1 tablet by mouth 2 (two)  times daily.    11/03/2015 at pm  . DULoxetine (CYMBALTA) 30 MG capsule Take 60 mg by mouth daily.    11/03/2015 at 0900  . enoxaparin (LOVENOX) 60 MG/0.6ML injection Inject 0.6 mLs (60 mg total) into the skin every 12 (twelve) hours. 60 Syringe 0 11/03/2015 at 0900  . folic acid (FOLVITE) 1 MG tablet TAKE 1 TABLET BY MOUTH DAILY START 5-7 DAYS BEFORE ALIMTA CHEMO, CONTINUE UNTIL 21 DAYS AFTER ALIMTA 100 tablet 3 11/03/2015 at 0900  . gabapentin (NEURONTIN) 100 MG capsule Take 1 capsule (100 mg total) by mouth 3 (three) times daily. 90 capsule 4 11/03/2015 at Unknown time  . hydrOXYzine (ATARAX/VISTARIL) 10 MG tablet Take 1 tablet (10 mg total) by mouth 3 (three) times daily as needed for itching. 60 tablet 1 11/03/2015 at Unknown time  . levothyroxine (SYNTHROID, LEVOTHROID) 88 MCG tablet Take 1 tablet (88 mcg total) by mouth daily before breakfast. (Patient taking differently: Take 44 mcg by mouth daily before breakfast. ) 30 tablet 0 11/03/2015 at Unknown time  . LORazepam (ATIVAN) 0.5 MG tablet Take 1 tablet (0.5 mg total) by mouth every 8 (eight) hours as needed for anxiety. 30 tablet 0 prn at prn  . metoCLOPramide (REGLAN) 10 MG tablet Take 1 tablet (10 mg total) by mouth every 6 (six) hours as needed. 30 tablet 0 11/03/2015 at 09000  . metoprolol succinate (TOPROL-XL) 25 MG 24 hr tablet Take 25 mg by mouth 2 (two) times daily.   2 11/03/2015 at pm  . nitroGLYCERIN (  NITROSTAT) 0.4 MG SL tablet Place 0.4 mg under the tongue every 5 (five) minutes as needed.   0 11/03/2015 at 1500  . ondansetron (ZOFRAN) 8 MG tablet Take 1 tablet (8 mg total) by mouth every 8 (eight) hours as needed for nausea or vomiting. 30 tablet 3 prn at prn  . oxyCODONE (OXY IR/ROXICODONE) 5 MG immediate release tablet Take 1 tablet (5 mg total) by mouth every 4 (four) hours as needed for severe pain. 30 tablet 0 11/03/2015 at pm  . predniSONE (DELTASONE) 10 MG tablet 10/5-10/8 - take 4 tabs; 10/9-10/15 - take 3 tabs;  10/16-10/22 - take 2 tabs; 10/23-10/29 - take 1 tabs   Taking  . QUEtiapine (SEROQUEL) 50 MG tablet Take 50 mg by mouth daily.    11/03/2015 at pm  . sulfamethoxazole-trimethoprim (BACTRIM DS,SEPTRA DS) 800-160 MG tablet Take 1 tablet by mouth once. Monday, Wednesday, and Friday for 10 days   11/03/2015 at Unknown time  . valACYclovir (VALTREX) 500 MG tablet Take 500 mg by mouth daily.    11/03/2015 at 0900  . aluminum-magnesium hydroxide-simethicone (MAALOX) 951-884-16 MG/5ML SUSP Take 30 mLs by mouth 4 (four) times daily -  before meals and at bedtime. (Patient not taking: Reported on 11/04/2015) 355 mL 0 Not Taking at prn  . atorvastatin (LIPITOR) 80 MG tablet Take 80 mg by mouth daily.    Taking  . pantoprazole (PROTONIX) 40 MG tablet Take 40 mg by mouth daily.    Taking  . simethicone (MYLICON) 80 MG chewable tablet Chew 80 mg by mouth daily.    Taking   Social History   Social History  . Marital status: Married    Spouse name: N/A  . Number of children: N/A  . Years of education: N/A   Occupational History  . Not on file.   Social History Main Topics  . Smoking status: Former Smoker    Packs/day: 1.00    Years: 40.00    Quit date: 06/20/2013  . Smokeless tobacco: Never Used  . Alcohol use No  . Drug use: No  . Sexual activity: Not on file   Other Topics Concern  . Not on file   Social History Narrative  . No narrative on file    Family History  Problem Relation Age of Onset  . Hypertension Other   . Heart failure Other   . Cancer Father       Review of systems complete and found to be negative unless listed above      PHYSICAL EXAM  General: Well developed, well nourished, in no acute distress HEENT:  Normocephalic and atramatic Neck:  No JVD.  Lungs: Clear bilaterally to auscultation and percussion. Heart: HRRR . Normal S1 and S2 without gallops or murmurs.  Abdomen: Bowel sounds are positive, abdomen soft and non-tender  Msk:  Back normal, normal gait.  Normal strength and tone for age. Extremities: No clubbing, cyanosis or edema.   Neuro: Alert and oriented X 3. Psych:  Good affect, responds appropriately  Labs:   Lab Results  Component Value Date   WBC 5.6 11/04/2015   HGB 7.6 (L) 11/04/2015   HCT 23.3 (L) 11/04/2015   MCV 93.2 11/04/2015   PLT 108 (L) 11/04/2015    Recent Labs Lab 11/04/15 0549  NA 139  K 3.8  CL 100*  CO2 30  BUN 18  CREATININE 1.03  CALCIUM 9.0  PROT 6.6  BILITOT 0.4  ALKPHOS 86  ALT 22  AST 25  GLUCOSE 97   Lab Results  Component Value Date   CKTOTAL 28 (L) 08/06/2013   CKMB 0.5 03/02/2009   TROPONINI 0.09 (HH) 11/04/2015    Lab Results  Component Value Date   CHOL 191 08/07/2013   Lab Results  Component Value Date   HDL 55 08/07/2013   Lab Results  Component Value Date   LDLCALC 112 (H) 08/07/2013   Lab Results  Component Value Date   TRIG 122 08/07/2013   No results found for: CHOLHDL No results found for: LDLDIRECT    Radiology: Ct Angio Chest Pe W And/or Wo Contrast  Result Date: 11/04/2015 CLINICAL DATA:  Lung cancer with metastatic spread. Chest and abdominal/pelvic pain all week with worsening today. EXAM: CT ANGIOGRAPHY CHEST CT ABDOMEN AND PELVIS WITH CONTRAST TECHNIQUE: Multidetector CT imaging of the chest was performed using the standard protocol during bolus administration of intravenous contrast. Multiplanar CT image reconstructions and MIPs were obtained to evaluate the vascular anatomy. Multidetector CT imaging of the abdomen and pelvis was performed using the standard protocol during bolus administration of intravenous contrast. CONTRAST:  100 cc Isovue 370 COMPARISON:  PET-CT 09/06/2015.  Diagnostic CT 08/09/2015. FINDINGS: CTA CHEST FINDINGS Cardiovascular: The heart size is normal. No pericardial effusion. No thoracic aortic aneurysm no filling defects in the opacified pulmonary arteries to suggest the presence of an acute pulmonary embolus Mediastinum/Nodes:  Interval increase in irregular soft tissue associated with the right hilum. 12 mm short axis AP window lymph node on image 37 series 5 was 8 mm on the previous PET-CT 6 mm short axis prevascular lymph node today was 3 mm previously. 6 mm high right paratracheal lymph node (image 15) was 4 mm previously. 8 mm prevascular lymph node on image 18 was 4 mm previously. The esophagus has normal imaging features. Small right axillary and subpectoral lymph nodes have progressed in the interval. Left supraclavicular lymphadenopathy is progressed. 11 mm left supraclavicular lymph node today was 7 mm previously. Lungs/Pleura: Emphysema. Airspace consolidation medial right lung compatible with post radiation change. 6 mm right lower lobe nodule on image 64 is more prominent in the interval. Scarring noted in the upper lungs bilaterally. Musculoskeletal: Bone windows reveal no worrisome lytic or sclerotic osseous lesions. Review of the MIP images confirms the above findings. CT ABDOMEN and PELVIS FINDINGS Hepatobiliary: No focal abnormality within the liver parenchyma. There is no evidence for gallstones, gallbladder wall thickening, or pericholecystic fluid. No intrahepatic or extrahepatic biliary dilation. Pancreas: No focal mass lesion. No dilatation of the main duct. No intraparenchymal cyst. No peripancreatic edema. Spleen: No splenomegaly. No focal mass lesion. Adrenals/Urinary Tract: No substantial change right adrenal lesion. 2.6 cm left adrenal nodule today measured 2.3 cm previously. Tiny hypo attenuating renal lesions are too small to characterize but likely represent cysts. No evidence for hydroureter. The urinary bladder appears normal for the degree of distention. Stomach/Bowel: Stomach is nondistended. No gastric wall thickening. No evidence of outlet obstruction. Duodenum is normally positioned as is the ligament of Treitz. No small bowel wall thickening. No small bowel dilatation. The terminal ileum is normal.  The appendix is normal. No gross colonic mass. No colonic wall thickening. No substantial diverticular change. Vascular/Lymphatic: There is abdominal aortic atherosclerosis without aneurysm. IVC filter visualized in situ. Retroperitoneal lymphadenopathy is somewhat obscured by streak artifact from the patient's spinal hardware. No pelvic sidewall lymphadenopathy. Reproductive: The prostate gland and seminal vesicles have normal imaging features. Other: No intraperitoneal free fluid. Musculoskeletal: Status post lumbar fusion.  Review of the MIP images confirms the above findings. IMPRESSION: 1. No CT evidence for acute pulmonary embolus. 2. Interval progression of mediastinal, right axillary/subpectoral, and left supraclavicular lymphadenopathy. Increasing a amorphous soft tissue identified in the right hilum, also suspicious for disease progression. 3. 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 4. Retroperitoneal lymphadenopathy appears more prominent, but is difficult to discretely measure given streak artifact from the spinal hardware. Electronically Signed   By: Misty Stanley M.D.   On: 11/04/2015 08:22   Ct Abdomen Pelvis W Contrast  Result Date: 11/04/2015 CLINICAL DATA:  Lung cancer with metastatic spread. Chest and abdominal/pelvic pain all week with worsening today. EXAM: CT ANGIOGRAPHY CHEST CT ABDOMEN AND PELVIS WITH CONTRAST TECHNIQUE: Multidetector CT imaging of the chest was performed using the standard protocol during bolus administration of intravenous contrast. Multiplanar CT image reconstructions and MIPs were obtained to evaluate the vascular anatomy. Multidetector CT imaging of the abdomen and pelvis was performed using the standard protocol during bolus administration of intravenous contrast. CONTRAST:  100 cc Isovue 370 COMPARISON:  PET-CT 09/06/2015.  Diagnostic CT 08/09/2015. FINDINGS: CTA CHEST FINDINGS Cardiovascular: The heart size is normal. No pericardial effusion. No  thoracic aortic aneurysm no filling defects in the opacified pulmonary arteries to suggest the presence of an acute pulmonary embolus Mediastinum/Nodes: Interval increase in irregular soft tissue associated with the right hilum. 12 mm short axis AP window lymph node on image 37 series 5 was 8 mm on the previous PET-CT 6 mm short axis prevascular lymph node today was 3 mm previously. 6 mm high right paratracheal lymph node (image 15) was 4 mm previously. 8 mm prevascular lymph node on image 18 was 4 mm previously. The esophagus has normal imaging features. Small right axillary and subpectoral lymph nodes have progressed in the interval. Left supraclavicular lymphadenopathy is progressed. 11 mm left supraclavicular lymph node today was 7 mm previously. Lungs/Pleura: Emphysema. Airspace consolidation medial right lung compatible with post radiation change. 6 mm right lower lobe nodule on image 64 is more prominent in the interval. Scarring noted in the upper lungs bilaterally. Musculoskeletal: Bone windows reveal no worrisome lytic or sclerotic osseous lesions. Review of the MIP images confirms the above findings. CT ABDOMEN and PELVIS FINDINGS Hepatobiliary: No focal abnormality within the liver parenchyma. There is no evidence for gallstones, gallbladder wall thickening, or pericholecystic fluid. No intrahepatic or extrahepatic biliary dilation. Pancreas: No focal mass lesion. No dilatation of the main duct. No intraparenchymal cyst. No peripancreatic edema. Spleen: No splenomegaly. No focal mass lesion. Adrenals/Urinary Tract: No substantial change right adrenal lesion. 2.6 cm left adrenal nodule today measured 2.3 cm previously. Tiny hypo attenuating renal lesions are too small to characterize but likely represent cysts. No evidence for hydroureter. The urinary bladder appears normal for the degree of distention. Stomach/Bowel: Stomach is nondistended. No gastric wall thickening. No evidence of outlet obstruction.  Duodenum is normally positioned as is the ligament of Treitz. No small bowel wall thickening. No small bowel dilatation. The terminal ileum is normal. The appendix is normal. No gross colonic mass. No colonic wall thickening. No substantial diverticular change. Vascular/Lymphatic: There is abdominal aortic atherosclerosis without aneurysm. IVC filter visualized in situ. Retroperitoneal lymphadenopathy is somewhat obscured by streak artifact from the patient's spinal hardware. No pelvic sidewall lymphadenopathy. Reproductive: The prostate gland and seminal vesicles have normal imaging features. Other: No intraperitoneal free fluid. Musculoskeletal: Status post lumbar fusion. Review of the MIP images confirms the above findings. IMPRESSION: 1. No CT  evidence for acute pulmonary embolus. 2. Interval progression of mediastinal, right axillary/subpectoral, and left supraclavicular lymphadenopathy. Increasing a amorphous soft tissue identified in the right hilum, also suspicious for disease progression. 3. 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 4. Retroperitoneal lymphadenopathy appears more prominent, but is difficult to discretely measure given streak artifact from the spinal hardware. Electronically Signed   By: Misty Stanley M.D.   On: 11/04/2015 08:22   Dg Chest Port 1 View  Result Date: 11/04/2015 CLINICAL DATA:  Left-sided chest pain. Pain for 1 week, worse tonight. EXAM: PORTABLE CHEST 1 VIEW COMPARISON:  Radiographs 10/13/2015, PET-CT 09/06/2015 FINDINGS: Tip of the right chest port in the SVC. Unchanged heart size and mediastinal contours. Stable streaky right midlung zone opacity most consistent with postradiation change. Resolved adjacent consolidation. No new focal airspace disease. No pleural fluid, pulmonary edema or pneumothorax. IMPRESSION: Resolved right lung opacity adjacent to postradiation change. No new abnormality is seen. Electronically Signed   By: Jeb Levering M.D.   On:  11/04/2015 06:09   Dg Chest Port 1 View  Result Date: 10/13/2015 CLINICAL DATA:  Fever. EXAM: PORTABLE CHEST 1 VIEW COMPARISON:  09/29/2015.  PET-CT 09/06/2015. FINDINGS: Port-A-Cath noted with tip over the cavoatrial junction. Persistent density noted over the right perihilar region most likely secondary to prior radiation. No change from prior exam. An adjacent mild infiltrate noted consistent with pneumonia. a No pleural effusion or pneumothorax. IMPRESSION: Persistent right perihilar density most likely from prior radiation. And adjacent mild infiltrate consistent with pneumonia is noted. Follow-up chest x-rays to demonstrate clearing suggested. Electronically Signed   By: Marcello Moores  Register   On: 10/13/2015 17:32    EKG: Normal sinus rhythm without acute ischemic ST-T wave changes  ASSESSMENT AND PLAN:   1. Pleuritic chest pain, nondiagnostic ECG, negative troponin, with known insignificant coronary artery disease by recent cardiac catheterization, chest pain likely noncardiac in nature, more likely due to underlying metastatic lung cancer 2. Anemia, currently receiving blood transfusion 3. Metastatic lung cancer  Recommendations  1. Agree with current therapy 2. Defer full dose anticoagulation 3. Defer further cardiac diagnostics at this time   Signed: Megen Madewell MD,PhD, [facility] 11/04/2015, 2:11 PM

## 2015-11-04 NOTE — ED Triage Notes (Signed)
Patient with complaint of left side chest pain times one week that has become worse tonight. Patient states that he has also been short of breath and nausea.

## 2015-11-04 NOTE — ED Provider Notes (Addendum)
[facility] Emergency Department Provider Note   ____________________________________________   First MD Initiated Contact with Patient 11/04/15 [phone]     (approximate)  I have reviewed the triage vital signs and the nursing notes.   HISTORY  Chief Complaint Chest Pain; Shortness of Breath; and Nausea    HPI Darryl Hatfield is a 55 y.o. male with an extremely complicated medical history of metastatic lung cancer, last chemo over 1 month ago, STEMI, CHF, bilateral DVTs, bilateral PEs on lovenox and chemotherapy-induced colitis presents for evaluation of one week of diffuse chest pain, constant, severe, worse with movement, associated with shortness of breath. He is also complaining of diffuse abdominal pain. He has had no vomiting or diarrhea. He has had chills but no fevers. He was seen on 11/01/15 by his primary care doctor for chest pain and told that if his symptoms are worsening he would need a CT scan of his chest. He denies any blood in his stools, denies any dark or tarry stools.   Past Medical History:  Diagnosis Date  . CHF (congestive heart failure) ([place])   . COPD (chronic obstructive pulmonary disease) ([place])   . Hypothyroidism   . Lung cancer ([place]) 2015   RIght   . Seizures [facility])     Patient Active Problem List   Diagnosis Date Noted  . Pulmonary embolism without acute cor pulmonale ([place]) 10/07/2015  . Acute deep vein thrombosis (DVT) of femoral vein of both lower extremities ([place]) 10/07/2015  . Colitis 10/07/2015  . Dehydration 09/28/2015  . Epigastric discomfort 09/16/2015  . STEMI (ST elevation myocardial infarction) ([place]) 09/16/2015  . Pruritus 07/27/2015  . Constipation 06/05/2015  . Anxiety 05/12/2015  . Weight loss 05/12/2015  . 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 Adenocarcinoma of lung, stage 4 ([place]) 04/25/2015  . Iron deficiency anemia 04/05/2015  . Episodes of formed visual hallucinations 12/26/2014  . Dementia  with behavioral disturbance 12/26/2014  . Delirium 12/25/2014  . Cancer of upper lobe of right lung ([place]) 07/02/2013  . SCIATICA 03/01/2010    Past Surgical History:  Procedure Laterality Date  . BACK SURGERY    . COLONOSCOPY  10/2015  . PORTACATH PLACEMENT Right     Prior to Admission medications   Medication Sig Start Date End Date Taking? Authorizing Provider  acetaminophen (TYLENOL) 325 MG tablet Take 650 mg by mouth every 6 (six) hours as needed for mild pain.  04/21/14   Historical Provider, MD  albuterol (PROVENTIL) (2.5 MG/3ML) 0.083% nebulizer solution Take 3 mLs (2.5 mg total) by nebulization every 6 (six) hours as needed for wheezing or shortness of breath. Patient taking differently: Take 2.5 mg by nebulization 2 (two) times daily.  02/03/15   Dmitriy Berenzon, MD  aluminum-magnesium hydroxide-simethicone (MAALOX) 119-147-82 MG/5ML SUSP Take 30 mLs by mouth 4 (four) times daily -  before meals and at bedtime. 09/29/15   Carrie Mew, MD  aspirin 81 MG EC tablet  09/25/15   Historical Provider, MD  atorvastatin (LIPITOR) 80 MG tablet Take 80 mg by mouth daily.  09/25/15 10/25/15  Historical Provider, MD  Calcium Carbonate-Vitamin D 600-400 MG-UNIT tablet Take 1 tablet by mouth 2 (two) times daily.  09/26/15 09/25/16  Historical Provider, MD  dexamethasone (DECADRON) 4 MG tablet TAKE 1 TAB 2 TIMES A DAY BEFORE CHEMO. TAKE 2 TABS TWO TIMES A DAY STARTING THE DAY AFTER CHEMO 08/17/15   Historical Provider, MD  DULoxetine (CYMBALTA) 30 MG capsule Take 30 mg  by mouth daily.    Historical Provider, MD  enoxaparin (LOVENOX) 60 MG/0.6ML injection Inject 0.6 mLs (60 mg total) into the skin every 12 (twelve) hours. 09/28/15 10/28/15  Lequita Asal, MD  folic acid (FOLVITE) 1 MG tablet TAKE 1 TABLET BY MOUTH DAILY START 5-7 DAYS BEFORE ALIMTA CHEMO, CONTINUE UNTIL 21 DAYS AFTER ALIMTA 09/13/15   Lequita Asal, MD  gabapentin (NEURONTIN) 100 MG capsule Take 1 capsule (100 mg total) by mouth 3  (three) times daily. 03/15/15   Dmitriy Berenzon, MD  hydrOXYzine (ATARAX/VISTARIL) 10 MG tablet Take 1 tablet (10 mg total) by mouth 3 (three) times daily as needed for itching. 10/05/15   Lequita Asal, MD  hydrOXYzine (ATARAX/VISTARIL) 10 MG tablet TAKE 1 TABLET (10 MG TOTAL) BY MOUTH 3 (THREE) TIMES DAILY AS NEEDED FOR ITCHING. 10/23/15   Lequita Asal, MD  levothyroxine (SYNTHROID, LEVOTHROID) 88 MCG tablet Take 1 tablet (88 mcg total) by mouth daily before breakfast. Patient taking differently: Take 44 mcg by mouth daily before breakfast.  06/16/15   Lequita Asal, MD  LORazepam (ATIVAN) 0.5 MG tablet Take 1 tablet (0.5 mg total) by mouth every 8 (eight) hours as needed for anxiety. 06/16/15   Lequita Asal, MD  metoCLOPramide (REGLAN) 10 MG tablet Take 1 tablet (10 mg total) by mouth every 6 (six) hours as needed. 09/29/15   Carrie Mew, MD  metoprolol succinate (TOPROL-XL) 25 MG 24 hr tablet  09/26/15   Historical Provider, MD  nicotine (NICODERM CQ - DOSED IN MG/24 HOURS) 14 mg/24hr patch Place onto the skin. 10/25/15 11/08/15  Historical Provider, MD  nitroGLYCERIN (NITROSTAT) 0.4 MG SL tablet Place 0.4 mg under the tongue every 5 (five) minutes as needed.  09/25/15   Historical Provider, MD  ondansetron (ZOFRAN) 8 MG tablet Take 1 tablet (8 mg total) by mouth every 8 (eight) hours as needed for nausea or vomiting. 09/07/15   Lequita Asal, MD  oxyCODONE (OXY IR/ROXICODONE) 5 MG immediate release tablet Take 1 tablet (5 mg total) by mouth every 4 (four) hours as needed for severe pain. 10/30/15 11/29/15  Lequita Asal, MD  pantoprazole (PROTONIX) 40 MG tablet Take by mouth. 09/25/15 10/25/15  Historical Provider, MD  predniSONE (DELTASONE) 10 MG tablet 10/5-10/8 - take 4 tabs; 10/9-10/15 - take 3 tabs; 10/16-10/22 - take 2 tabs; 10/23-10/29 - take 1 tabs 10/25/15   Historical Provider, MD  QUEtiapine (SEROQUEL) 50 MG tablet Take 50 mg by mouth daily.  07/18/15 01/14/16   Historical Provider, MD  simethicone (MYLICON) 80 MG chewable tablet Chew 80 mg by mouth daily.  09/25/15 10/05/15  Historical Provider, MD  sulfamethoxazole-trimethoprim (BACTRIM DS,SEPTRA DS) 800-160 MG tablet Take by mouth. 10/25/15 11/04/15  Historical Provider, MD  tiotropium (SPIRIVA) 18 MCG inhalation capsule Place 18 mcg into inhaler and inhale daily.    Historical Provider, MD  valACYclovir (VALTREX) 500 MG tablet Take by mouth. 10/11/15   Historical Provider, MD    Allergies No known allergies  Family History  Problem Relation Age of Onset  . Hypertension Other   . Heart failure Other   . Cancer Father     Social History Social History  Substance Use Topics  . Smoking status: Former Smoker    Packs/day: 1.00    Years: 40.00    Quit date: 06/20/2013  . Smokeless tobacco: Never Used  . Alcohol use No    Review of Systems Constitutional: No fever/chills Eyes: No visual changes. ENT:  No sore throat. Cardiovascular: + chest pain. Respiratory: +shortness of breath. Gastrointestinal: + abdominal pain.  +nausea, no vomiting.  No diarrhea.  No constipation. Genitourinary: Negative for dysuria. Musculoskeletal: Negative for back pain. Skin: Negative for rash. Neurological: Negative for headaches, focal weakness or numbness.  10-point ROS otherwise negative.  ____________________________________________   PHYSICAL EXAM:  VITAL SIGNS: ED Triage Vitals  Enc Vitals Group     BP 11/04/15 0539 (!) 140/93     Pulse Rate 11/04/15 0539 (!) 132     Resp 11/04/15 0539 (!) 28     Temp 11/04/15 0539 98 F (36.7 C)     Temp Source 11/04/15 0539 Oral     SpO2 --      Weight 11/04/15 0536 139 lb (63 kg)     Height 11/04/15 0536 '5\' 8"'$  (1.727 m)     Head Circumference --      Peak Flow --      Pain Score 11/04/15 0537 10     Pain Loc --      Pain Edu? --      Excl. in [place]? --     Constitutional: Alert and oriented. Fatigued and chronically ill-appearing. Eyes: Conjunctivae  are normal. PERRL. EOMI. Head: Atraumatic. Nose: No congestion/rhinnorhea. Mouth/Throat: Mucous membranes are moist.  Oropharynx non-erythematous. Neck: No stridor.  Supple without meningismus. Cardiovascular: Tachycardic rate, regular rhythm. Grossly normal heart sounds.  Good peripheral circulation. Respiratory: Tachypnea with mildly agrees work of breathing. No retractions. Lungs CTAB. Gastrointestinal: Soft with moderate diffuse tenderness to palpation. No distention.No CVA tenderness. Genitourinary: Deferred Rectal: Brown stool in rectal vault is guaiac negative Musculoskeletal: No lower extremity tenderness nor edema.  No joint effusions. Neurologic:  Normal speech and language. No gross focal neurologic deficits are appreciated. Skin:  Skin is warm, dry and intact. No rash noted. Psychiatric: Mood and affect are normal. Speech and behavior are normal.  ____________________________________________   LABS (all labs ordered are listed, but only abnormal results are displayed)  Labs Reviewed  TROPONIN I - Abnormal; Notable for the following:       Result Value   Troponin I 0.03 (*)    All other components within normal limits  COMPREHENSIVE METABOLIC PANEL - Abnormal; Notable for the following:    Chloride 100 (*)    All other components within normal limits  CBC WITH DIFFERENTIAL/PLATELET - Abnormal; Notable for the following:    RBC 2.50 (*)    Hemoglobin 7.6 (*)    HCT 23.3 (*)    RDW 15.8 (*)    Platelets 108 (*)    All other components within normal limits  APTT - Abnormal; Notable for the following:    aPTT 39 (*)    All other components within normal limits  CULTURE, BLOOD (ROUTINE X 2)  CULTURE, BLOOD (ROUTINE X 2)  LIPASE, BLOOD  PROTIME-INR  LACTIC ACID, PLASMA  LACTIC ACID, PLASMA  URINALYSIS COMPLETEWITH MICROSCOPIC ([facility] ONLY)  TYPE AND SCREEN  PREPARE RBC (CROSSMATCH)  ABO/RH   ____________________________________________  EKG  ED ECG REPORT I,  Joanne Gavel, the attending physician, personally viewed and interpreted this ECG.   Date: 11/04/2015  EKG Time: 05:41  Rate: 129  Rhythm: sinus tachycardia  Axis: normal  Intervals:none  ST&T Change: No acute ST elevation or acute ST depression. Baseline wander slightly limits the interpretation. Q wave in V2. Q wave in aVL.  ____________________________________________  RADIOLOGY  CXR IMPRESSION:  Resolved right lung opacity adjacent to postradiation change. No  new  abnormality is seen.     ____________________________________________   PROCEDURES  Procedure(s) performed: None  Procedures  Critical Care performed: Yes, see critical care note(s)   CRITICAL CARE Performed by: Loura Pardon A   Total critical care time: 35 minutes  Critical care time was exclusive of separately billable procedures and treating other patients.  Critical care was necessary to treat or prevent imminent or life-threatening deterioration.  Critical care was time spent personally by me on the following activities: development of treatment plan with patient and/or surrogate as well as nursing, discussions with consultants, evaluation of patient's response to treatment, examination of patient, obtaining history from patient or surrogate, ordering and performing treatments and interventions, ordering and review of laboratory studies, ordering and review of radiographic studies, pulse oximetry and re-evaluation of patient's condition.  ____________________________________________   INITIAL IMPRESSION / ASSESSMENT AND PLAN / ED COURSE  Pertinent labs & imaging results that were available during my care of the patient were reviewed by me and considered in my medical decision making (see chart for details).  Darryl Hatfield is a 55 y.o. male with an extremely complicated medical history of metastatic lung cancer, last chemo over 1 month ago, STEMI, CHF, bilateral DVTs, bilateral PEs on lovenox  and chemotherapy-induced colitis presents for evaluation of one week of diffuse chest pain, constant, severe, worse with movement, associated with shortness of breath. On exam, he is fatigued and ill-appearing. He is tachycardic and tachypneic meeting 2 out of 4 Sirs criteria. Code sepsis initiated. We'll give IV fluids, vancomycin and Zosyn. Clean. We'll obtain screening labs, blood cultures, lactic acid, urinalysis and chest x-ray. EKG shows sinus tachycardia, not consistent with acute ischemia.  ----------------------------------------- 7:00 AM on 11/04/2015 ----------------------------------------- Heart rate improved to 118 bpm at this time. Pain also improving. Labs are concerning for severe anemia, hemoglobin 7.6 which is almost a 5 point drop in his hemoglobin and over the past week. There is no evidence to suggest an acute/ongoing GI bleed. Patient has given consent to receive blood transfusion after we discussed risks and benefits. I have ordered 2 units of PRBCs. CMP and lipase are unremarkable. Lactic acid is reassuring at 1.8. Troponin  is 0.03. CT angio of chest and a CT of the abdomen and pelvis are pending. The patient will require admission. Care transferred to Dr. Cinda Quest at this time.  Clinical Course     ____________________________________________   FINAL CLINICAL IMPRESSION(S) / ED DIAGNOSES  Final diagnoses:  Chest pain, unspecified type  Symptomatic anemia  Generalized abdominal pain  Sepsis, due to unspecified organism [facility])      NEW MEDICATIONS STARTED DURING THIS VISIT:  New Prescriptions   No medications on file     Note:  This document was prepared using Dragon voice recognition software and may include unintentional dictation errors.    Joanne Gavel, MD 11/04/15 [id number]    Joanne Gavel, MD 11/04/15 [phone]

## 2015-11-05 DIAGNOSIS — C3411 Malignant neoplasm of upper lobe, right bronchus or lung: Secondary | ICD-10-CM

## 2015-11-05 DIAGNOSIS — C7971 Secondary malignant neoplasm of right adrenal gland: Secondary | ICD-10-CM

## 2015-11-05 DIAGNOSIS — C7972 Secondary malignant neoplasm of left adrenal gland: Secondary | ICD-10-CM | POA: Diagnosis not present

## 2015-11-05 DIAGNOSIS — Z79899 Other long term (current) drug therapy: Secondary | ICD-10-CM

## 2015-11-05 DIAGNOSIS — R079 Chest pain, unspecified: Secondary | ICD-10-CM | POA: Diagnosis not present

## 2015-11-05 DIAGNOSIS — E039 Hypothyroidism, unspecified: Secondary | ICD-10-CM | POA: Diagnosis not present

## 2015-11-05 DIAGNOSIS — D509 Iron deficiency anemia, unspecified: Secondary | ICD-10-CM

## 2015-11-05 DIAGNOSIS — C778 Secondary and unspecified malignant neoplasm of lymph nodes of multiple regions: Secondary | ICD-10-CM

## 2015-11-05 DIAGNOSIS — C7800 Secondary malignant neoplasm of unspecified lung: Secondary | ICD-10-CM

## 2015-11-05 DIAGNOSIS — I509 Heart failure, unspecified: Secondary | ICD-10-CM

## 2015-11-05 DIAGNOSIS — J449 Chronic obstructive pulmonary disease, unspecified: Secondary | ICD-10-CM

## 2015-11-05 DIAGNOSIS — Z87891 Personal history of nicotine dependence: Secondary | ICD-10-CM

## 2015-11-05 DIAGNOSIS — E569 Vitamin deficiency, unspecified: Secondary | ICD-10-CM

## 2015-11-05 LAB — CBC
HEMATOCRIT: 41.6 % (ref 40.0–52.0)
HEMOGLOBIN: 14 g/dL (ref 13.0–18.0)
MCH: 29.7 pg (ref 26.0–34.0)
MCHC: 33.6 g/dL (ref 32.0–36.0)
MCV: 88.4 fL (ref 80.0–100.0)
Platelets: 138 10*3/uL — ABNORMAL LOW (ref 150–440)
RBC: 4.71 MIL/uL (ref 4.40–5.90)
RDW: 17.1 % — AB (ref 11.5–14.5)
WBC: 9 10*3/uL (ref 3.8–10.6)

## 2015-11-05 LAB — TYPE AND SCREEN
ABO/RH(D): O POS
Antibody Screen: NEGATIVE
UNIT DIVISION: 0
Unit division: 0

## 2015-11-05 LAB — COMPREHENSIVE METABOLIC PANEL
ALBUMIN: 3.5 g/dL (ref 3.5–5.0)
ALT: 21 U/L (ref 17–63)
ANION GAP: 8 (ref 5–15)
AST: 25 U/L (ref 15–41)
Alkaline Phosphatase: 90 U/L (ref 38–126)
BILIRUBIN TOTAL: 1.5 mg/dL — AB (ref 0.3–1.2)
BUN: 17 mg/dL (ref 6–20)
CHLORIDE: 94 mmol/L — AB (ref 101–111)
CO2: 34 mmol/L — AB (ref 22–32)
Calcium: 9.4 mg/dL (ref 8.9–10.3)
Creatinine, Ser: 1.07 mg/dL (ref 0.61–1.24)
GFR calc Af Amer: >60 mL/min (ref >60)
GFR calc non Af Amer: >60 mL/min (ref >60)
GLUCOSE: 98 mg/dL (ref 65–99)
POTASSIUM: 5.1 mmol/L (ref 3.5–5.1)
SODIUM: 136 mmol/L (ref 135–145)
Total Protein: 6.7 g/dL (ref 6.5–8.1)

## 2015-11-05 LAB — ECHOCARDIOGRAM COMPLETE
Height: 68 in
Weight: 2224 oz

## 2015-11-05 LAB — TROPONIN I
TROPONIN I: 0.06 ng/mL — AB (ref <0.03)
Troponin I: 0.06 ng/mL (ref <0.03)

## 2015-11-05 MED ORDER — PREDNISONE 20 MG PO TABS
30.0000 mg | ORAL_TABLET | Freq: Every day | ORAL | Status: DC
  Administered 2015-11-05: 11:00:00 30 mg via ORAL
  Filled 2015-11-05: qty 1

## 2015-11-05 NOTE — Progress Notes (Signed)
Pt being discharged today, discharge instructions given to patient and his wife, they both verified understanding. 0 paper prescriptions given to patient. Pt verbally told to continue Lovenox at home per Dr. Fritzi Mandes. IV x1 removed and port of cath deaccessed. All belongings returned to patient. Patient was rolled out in wheelchair by staff.

## 2015-11-05 NOTE — Consult Note (Signed)
[name] CONSULT NOTE  Patient Care Team: Marinda Elk, MD as PCP - General (Physician Assistant)  CHIEF COMPLAINTS/PURPOSE OF CONSULTATION: Metastatic lung cancer  HISTORY OF PRESENTING ILLNESS:  Darryl Hatfield 55 y.o.  male history of metastatic lung cancer currently in the hospital for chest pain. Patient has been evaluated by cardiology- chest pain likely noncardiac. Patient follows up with Dr. Mike Hatfield outpatient.   History of previous squamous cell stage III lung cancer; and a diagnosis of metastatic adenocarcinoma lung- most recently treated with opdivo- September 2017. Treatment is currently on hold because of colitis from opdivo.   Patient's chest pain is currently improved. Shortness of breath improved. Intermittent cough. No hemoptysis. Complains of itching of his nose/face. SECONDARY from Dilaudid. Denies any diarrhea.  ROS: A complete 10 point review of system is done which is negative except mentioned above in history of present illness  MEDICAL HISTORY:  Past Medical History:  Diagnosis Date  . CHF (congestive heart failure) ([place])   . COPD (chronic obstructive pulmonary disease) ([place])   . Hypothyroidism   . Lung cancer ([place]) 2015   RIght   . Seizures ([place])     SURGICAL HISTORY: Past Surgical History:  Procedure Laterality Date  . BACK SURGERY    . COLONOSCOPY  10/2015  . PORTACATH PLACEMENT Right     SOCIAL HISTORY: Social History   Social History  . Marital status: Married    Spouse name: N/A  . Number of children: N/A  . Years of education: N/A   Occupational History  . Not on file.   Social History Main Topics  . Smoking status: Former Smoker    Packs/day: 1.00    Years: 40.00    Quit date: 06/20/2013  . Smokeless tobacco: Never Used  . Alcohol use No  . Drug use: No  . Sexual activity: Not on file   Other Topics Concern  . Not on file   Social History Narrative  . No narrative on file    FAMILY  HISTORY: Family History  Problem Relation Age of Onset  . Hypertension Other   . Heart failure Other   . Cancer Father     ALLERGIES:  is allergic to no known allergies.  MEDICATIONS:  Current Facility-Administered Medications  Medication Dose Route Frequency Provider Last Rate Last Dose  . 0.9 %  sodium chloride infusion  250 mL Intravenous PRN Idelle Crouch, MD      . acetaminophen (TYLENOL) tablet 650 mg  650 mg Oral Q6H PRN Idelle Crouch, MD       Or  . acetaminophen (TYLENOL) suppository 650 mg  650 mg Rectal Q6H PRN Idelle Crouch, MD      . aspirin EC tablet 81 mg  81 mg Oral Daily Idelle Crouch, MD   81 mg at 11/05/15 0805  . atorvastatin (LIPITOR) tablet 80 mg  80 mg Oral QPM Idelle Crouch, MD   80 mg at 11/04/15 1844  . bisacodyl (DULCOLAX) suppository 10 mg  10 mg Rectal Daily PRN Idelle Crouch, MD      . diphenhydrAMINE (BENADRYL) injection 12.5 mg  12.5 mg Intravenous Q6H PRN Idelle Crouch, MD   12.5 mg at 11/05/15 0805  . docusate sodium (COLACE) capsule 100 mg  100 mg Oral BID Idelle Crouch, MD   100 mg at 11/05/15 0805  . DULoxetine (CYMBALTA) DR capsule 60 mg  60 mg Oral Daily Idelle Crouch, MD  60 mg at 11/05/15 0806  . enoxaparin (LOVENOX) injection 40 mg  40 mg Subcutaneous Q24H Idelle Crouch, MD   40 mg at 11/04/15 2101  . folic acid (FOLVITE) tablet 1 mg  1 mg Oral Daily Idelle Crouch, MD   1 mg at 11/05/15 0805  . gabapentin (NEURONTIN) capsule 100 mg  100 mg Oral TID Idelle Crouch, MD   100 mg at 11/05/15 0805  . guaiFENesin-dextromethorphan (ROBITUSSIN DM) 100-10 MG/5ML syrup 5 mL  5 mL Oral Q4H PRN Idelle Crouch, MD   5 mL at 11/05/15 0805  . HYDROmorphone (DILAUDID) injection 1 mg  1 mg Intravenous Q2H PRN Idelle Crouch, MD   1 mg at 11/05/15 0805  . hydrOXYzine (ATARAX/VISTARIL) tablet 10 mg  10 mg Oral TID PRN Idelle Crouch, MD   10 mg at 11/05/15 1033  . ipratropium-albuterol (DUONEB) 0.5-2.5 (3) MG/3ML nebulizer  solution 3 mL  3 mL Nebulization QID Idelle Crouch, MD   3 mL at 11/05/15 0721  . levothyroxine (SYNTHROID, LEVOTHROID) tablet 88 mcg  88 mcg Oral QAC breakfast Idelle Crouch, MD   88 mcg at 11/05/15 0806  . LORazepam (ATIVAN) tablet 0.5 mg  0.5 mg Oral Q8H PRN Idelle Crouch, MD   0.5 mg at 11/05/15 0630  . metoCLOPramide (REGLAN) tablet 10 mg  10 mg Oral Q6H PRN Idelle Crouch, MD      . metoprolol succinate (TOPROL-XL) 24 hr tablet 25 mg  25 mg Oral BID Idelle Crouch, MD   25 mg at 11/05/15 0805  . nitroGLYCERIN (NITROSTAT) SL tablet 0.4 mg  0.4 mg Sublingual Q5 min PRN Idelle Crouch, MD      . ondansetron [facility]) tablet 4 mg  4 mg Oral Q6H PRN Idelle Crouch, MD   4 mg at 11/05/15 1030   Or  . ondansetron (ZOFRAN) injection 4 mg  4 mg Intravenous Q6H PRN Idelle Crouch, MD      . oxyCODONE (Oxy IR/ROXICODONE) immediate release tablet 5 mg  5 mg Oral Q4H PRN Idelle Crouch, MD   5 mg at 11/05/15 1030  . pantoprazole (PROTONIX) EC tablet 40 mg  40 mg Oral Daily Idelle Crouch, MD   40 mg at 11/05/15 0806  . predniSONE (DELTASONE) tablet 30 mg  30 mg Oral Q breakfast Fritzi Mandes, MD   30 mg at 11/05/15 1030  . QUEtiapine (SEROQUEL) tablet 50 mg  50 mg Oral Daily Idelle Crouch, MD   50 mg at 11/04/15 1405  . sodium chloride flush (NS) 0.9 % injection 3 mL  3 mL Intravenous Q12H Idelle Crouch, MD   3 mL at 11/05/15 1004  . sodium chloride flush (NS) 0.9 % injection 3 mL  3 mL Intravenous Q12H Idelle Crouch, MD   3 mL at 11/05/15 1003  . sodium chloride flush (NS) 0.9 % injection 3 mL  3 mL Intravenous PRN Idelle Crouch, MD      . valACYclovir Estell Harpin) tablet 500 mg  500 mg Oral Daily Idelle Crouch, MD   500 mg at 11/05/15 0806   Facility-Administered Medications Ordered in Other Encounters  Medication Dose Route Frequency Provider Last Rate Last Dose  . sodium chloride 0.9 % injection 10 mL  10 mL Intracatheter PRN Forest Gleason, MD   10 mL at 06/27/14 1338   . sodium chloride 0.9 % injection 10 mL  10 mL Intracatheter PRN  Leia Alf, MD   10 mL at 08/22/14 0851  . sodium chloride flush (NS) 0.9 % injection 10 mL  10 mL Intravenous PRN Lequita Asal, MD          .  PHYSICAL EXAMINATION:  Vitals:   11/05/15 1109 11/05/15 1118  BP:    Pulse: 99 95  Resp:    Temp:     Filed Weights   11/04/15 0536 11/05/15 0500  Weight: 139 lb (63 kg) 142 lb 9.6 oz (64.7 kg)    GENERAL: Well-nourished well-developed; Alert, no distress and comfortable.   alone  EYES: no pallor or icterus OROPHARYNX: no thrush or ulceration. NECK: supple, no masses felt LYMPH:  no palpable lymphadenopathy in the cervical, axillary or inguinal regions LUNGS: decreased breath sounds to auscultation at bases and  No wheeze or crackles HEART/CVS: regular rate & rhythm and no murmurs; No lower extremity edema ABDOMEN: abdomen soft, non-tender and normal bowel sounds Musculoskeletal:no cyanosis of digits and no clubbing  PSYCH: alert & oriented x 3 with fluent speech NEURO: no focal motor/sensory deficits SKIN:  no rashes or significant lesions  LABORATORY DATA:  I have reviewed the data as listed Lab Results  Component Value Date   WBC 9.0 11/05/2015   HGB 14.0 11/05/2015   HCT 41.6 11/05/2015   MCV 88.4 11/05/2015   PLT 138 (L) 11/05/2015    Recent Labs  10/13/15 1444 10/30/15 1020 11/04/15 0549 11/05/15 0622  NA 133* 140 139 136  K 3.5 4.5 3.8 5.1  CL 94* 98* 100* 94*  CO2 31 33* 30 34*  GLUCOSE 144* 133* 97 98  BUN 18 21* 18 17  CREATININE 0.98 1.12 1.03 1.07  CALCIUM 8.7* 9.4 9.0 9.4  GFRNONAA >60 >60 >60 >60  GFRAA >60 >60 >60 >60  PROT 7.0  --  6.6 6.7  ALBUMIN 3.4*  --  3.5 3.5  AST 27  --  25 25  ALT 39  --  22 21  ALKPHOS 91  --  86 90  BILITOT 0.5  --  0.4 1.5*    RADIOGRAPHIC STUDIES: I have personally reviewed the radiological images as listed and agreed with the findings in the report. Ct Angio Chest Pe W And/or Wo  Contrast  Result Date: 11/04/2015 CLINICAL DATA:  Lung cancer with metastatic spread. Chest and abdominal/pelvic pain all week with worsening today. EXAM: CT ANGIOGRAPHY CHEST CT ABDOMEN AND PELVIS WITH CONTRAST TECHNIQUE: Multidetector CT imaging of the chest was performed using the standard protocol during bolus administration of intravenous contrast. Multiplanar CT image reconstructions and MIPs were obtained to evaluate the vascular anatomy. Multidetector CT imaging of the abdomen and pelvis was performed using the standard protocol during bolus administration of intravenous contrast. CONTRAST:  100 cc Isovue 370 COMPARISON:  PET-CT 09/06/2015.  Diagnostic CT 08/09/2015. FINDINGS: CTA CHEST FINDINGS Cardiovascular: The heart size is normal. No pericardial effusion. No thoracic aortic aneurysm no filling defects in the opacified pulmonary arteries to suggest the presence of an acute pulmonary embolus Mediastinum/Nodes: Interval increase in irregular soft tissue associated with the right hilum. 12 mm short axis AP window lymph node on image 37 series 5 was 8 mm on the previous PET-CT 6 mm short axis prevascular lymph node today was 3 mm previously. 6 mm high right paratracheal lymph node (image 15) was 4 mm previously. 8 mm prevascular lymph node on image 18 was 4 mm previously. The esophagus has normal imaging features. Small right axillary and  subpectoral lymph nodes have progressed in the interval. Left supraclavicular lymphadenopathy is progressed. 11 mm left supraclavicular lymph node today was 7 mm previously. Lungs/Pleura: Emphysema. Airspace consolidation medial right lung compatible with post radiation change. 6 mm right lower lobe nodule on image 64 is more prominent in the interval. Scarring noted in the upper lungs bilaterally. Musculoskeletal: Bone windows reveal no worrisome lytic or sclerotic osseous lesions. Review of the MIP images confirms the above findings. CT ABDOMEN and PELVIS FINDINGS  Hepatobiliary: No focal abnormality within the liver parenchyma. There is no evidence for gallstones, gallbladder wall thickening, or pericholecystic fluid. No intrahepatic or extrahepatic biliary dilation. Pancreas: No focal mass lesion. No dilatation of the main duct. No intraparenchymal cyst. No peripancreatic edema. Spleen: No splenomegaly. No focal mass lesion. Adrenals/Urinary Tract: No substantial change right adrenal lesion. 2.6 cm left adrenal nodule today measured 2.3 cm previously. Tiny hypo attenuating renal lesions are too small to characterize but likely represent cysts. No evidence for hydroureter. The urinary bladder appears normal for the degree of distention. Stomach/Bowel: Stomach is nondistended. No gastric wall thickening. No evidence of outlet obstruction. Duodenum is normally positioned as is the ligament of Treitz. No small bowel wall thickening. No small bowel dilatation. The terminal ileum is normal. The appendix is normal. No gross colonic mass. No colonic wall thickening. No substantial diverticular change. Vascular/Lymphatic: There is abdominal aortic atherosclerosis without aneurysm. IVC filter visualized in situ. Retroperitoneal lymphadenopathy is somewhat obscured by streak artifact from the patient's spinal hardware. No pelvic sidewall lymphadenopathy. Reproductive: The prostate gland and seminal vesicles have normal imaging features. Other: No intraperitoneal free fluid. Musculoskeletal: Status post lumbar fusion. Review of the MIP images confirms the above findings. IMPRESSION: 1. No CT evidence for acute pulmonary embolus. 2. Interval progression of mediastinal, right axillary/subpectoral, and left supraclavicular lymphadenopathy. Increasing a amorphous soft tissue identified in the right hilum, also suspicious for disease progression. 3. 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 4. Retroperitoneal lymphadenopathy appears more prominent, but is difficult to discretely  measure given streak artifact from the spinal hardware. Electronically Signed   By: Misty Stanley M.D.   On: 11/04/2015 08:22   Ct Abdomen Pelvis W Contrast  Result Date: 11/04/2015 CLINICAL DATA:  Lung cancer with metastatic spread. Chest and abdominal/pelvic pain all week with worsening today. EXAM: CT ANGIOGRAPHY CHEST CT ABDOMEN AND PELVIS WITH CONTRAST TECHNIQUE: Multidetector CT imaging of the chest was performed using the standard protocol during bolus administration of intravenous contrast. Multiplanar CT image reconstructions and MIPs were obtained to evaluate the vascular anatomy. Multidetector CT imaging of the abdomen and pelvis was performed using the standard protocol during bolus administration of intravenous contrast. CONTRAST:  100 cc Isovue 370 COMPARISON:  PET-CT 09/06/2015.  Diagnostic CT 08/09/2015. FINDINGS: CTA CHEST FINDINGS Cardiovascular: The heart size is normal. No pericardial effusion. No thoracic aortic aneurysm no filling defects in the opacified pulmonary arteries to suggest the presence of an acute pulmonary embolus Mediastinum/Nodes: Interval increase in irregular soft tissue associated with the right hilum. 12 mm short axis AP window lymph node on image 37 series 5 was 8 mm on the previous PET-CT 6 mm short axis prevascular lymph node today was 3 mm previously. 6 mm high right paratracheal lymph node (image 15) was 4 mm previously. 8 mm prevascular lymph node on image 18 was 4 mm previously. The esophagus has normal imaging features. Small right axillary and subpectoral lymph nodes have progressed in the interval. Left supraclavicular lymphadenopathy is progressed.  11 mm left supraclavicular lymph node today was 7 mm previously. Lungs/Pleura: Emphysema. Airspace consolidation medial right lung compatible with post radiation change. 6 mm right lower lobe nodule on image 64 is more prominent in the interval. Scarring noted in the upper lungs bilaterally. Musculoskeletal: Bone  windows reveal no worrisome lytic or sclerotic osseous lesions. Review of the MIP images confirms the above findings. CT ABDOMEN and PELVIS FINDINGS Hepatobiliary: No focal abnormality within the liver parenchyma. There is no evidence for gallstones, gallbladder wall thickening, or pericholecystic fluid. No intrahepatic or extrahepatic biliary dilation. Pancreas: No focal mass lesion. No dilatation of the main duct. No intraparenchymal cyst. No peripancreatic edema. Spleen: No splenomegaly. No focal mass lesion. Adrenals/Urinary Tract: No substantial change right adrenal lesion. 2.6 cm left adrenal nodule today measured 2.3 cm previously. Tiny hypo attenuating renal lesions are too small to characterize but likely represent cysts. No evidence for hydroureter. The urinary bladder appears normal for the degree of distention. Stomach/Bowel: Stomach is nondistended. No gastric wall thickening. No evidence of outlet obstruction. Duodenum is normally positioned as is the ligament of Treitz. No small bowel wall thickening. No small bowel dilatation. The terminal ileum is normal. The appendix is normal. No gross colonic mass. No colonic wall thickening. No substantial diverticular change. Vascular/Lymphatic: There is abdominal aortic atherosclerosis without aneurysm. IVC filter visualized in situ. Retroperitoneal lymphadenopathy is somewhat obscured by streak artifact from the patient's spinal hardware. No pelvic sidewall lymphadenopathy. Reproductive: The prostate gland and seminal vesicles have normal imaging features. Other: No intraperitoneal free fluid. Musculoskeletal: Status post lumbar fusion. Review of the MIP images confirms the above findings. IMPRESSION: 1. No CT evidence for acute pulmonary embolus. 2. Interval progression of mediastinal, right axillary/subpectoral, and left supraclavicular lymphadenopathy. Increasing a amorphous soft tissue identified in the right hilum, also suspicious for disease  progression. 3. 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 4. Retroperitoneal lymphadenopathy appears more prominent, but is difficult to discretely measure given streak artifact from the spinal hardware. Electronically Signed   By: Misty Stanley M.D.   On: 11/04/2015 08:22   Dg Chest Port 1 View  Result Date: 11/04/2015 CLINICAL DATA:  Left-sided chest pain. Pain for 1 week, worse tonight. EXAM: PORTABLE CHEST 1 VIEW COMPARISON:  Radiographs 10/13/2015, PET-CT 09/06/2015 FINDINGS: Tip of the right chest port in the SVC. Unchanged heart size and mediastinal contours. Stable streaky right midlung zone opacity most consistent with postradiation change. Resolved adjacent consolidation. No new focal airspace disease. No pleural fluid, pulmonary edema or pneumothorax. IMPRESSION: Resolved right lung opacity adjacent to postradiation change. No new abnormality is seen. Electronically Signed   By: Jeb Levering M.D.   On: 11/04/2015 06:09   Dg Chest Port 1 View  Result Date: 10/13/2015 CLINICAL DATA:  Fever. EXAM: PORTABLE CHEST 1 VIEW COMPARISON:  09/29/2015.  PET-CT 09/06/2015. FINDINGS: Port-A-Cath noted with tip over the cavoatrial junction. Persistent density noted over the right perihilar region most likely secondary to prior radiation. No change from prior exam. An adjacent mild infiltrate noted consistent with pneumonia. a No pleural effusion or pneumothorax. IMPRESSION: Persistent right perihilar density most likely from prior radiation. And adjacent mild infiltrate consistent with pneumonia is noted. Follow-up chest x-rays to demonstrate clearing suggested. Electronically Signed   By: Marcello Moores  Register   On: 10/13/2015 17:32    ASSESSMENT & PLAN:   # 55 year old male patient with recent history of  metastatic adenocarcinoma the lung - currently admitted to the hospital for chest pain.   #  Metastatic adenocarcinoma lung- status post Opdivo September 7614- complicated by colitis.  Patient's treatment is currently on hold. CT scan done during hospitalization shows- mild progression of the hilar/mediastinal adenopathy; also progression of the adrenal metastases. I reviewed the findings with the patient. Discussed restarting therapy- patient is stable/performance status improves.  # Chest pain noncardiac improved.   # History of colitis from- Opdivo; continue prednisone at the current dose. This could be tapered outpatient.  # Patient appointment with his primary oncologist Dr. Mike Hatfield in approximately 10 days from now. Encouraged to keep the appointment- to discuss further treatment options.  # Above plan of care was discussed with Dr. Posey Pronto.   All questions were answered. The patient knows to call the clinic with any problems, questions or concerns.    Cammie Sickle, MD 11/05/2015 11:22 AM

## 2015-11-05 NOTE — Progress Notes (Signed)
[facility] Cardiology  SUBJECTIVE: I'm doing better   Vitals:   11/05/15 0025 11/05/15 0437 11/05/15 0500 11/05/15 0803  BP: 126/76 121/75  112/73  Pulse: 92 94  95  Resp: 20 17    Temp: 97.9 F (36.6 C) 98.9 F (37.2 C)    TempSrc: Oral     SpO2: 92% 95%  94%  Weight:   64.7 kg (142 lb 9.6 oz)   Height:         Intake/Output Summary (Last 24 hours) at 11/05/15 1108 Last data filed at 11/05/15 0800  Gross per 24 hour  Intake             2634 ml  Output                0 ml  Net             2634 ml      PHYSICAL EXAM  General: Well developed, well nourished, in no acute distress HEENT:  Normocephalic and atramatic Neck:  No JVD.  Lungs: Clear bilaterally to auscultation and percussion. Heart: HRRR . Normal S1 and S2 without gallops or murmurs.  Abdomen: Bowel sounds are positive, abdomen soft and non-tender  Msk:  Back normal, normal gait. Normal strength and tone for age. Extremities: No clubbing, cyanosis or edema.   Neuro: Alert and oriented X 3. Psych:  Good affect, responds appropriately   LABS: Basic Metabolic Panel:  Recent Labs  11/04/15 0549 11/05/15 0622  NA 139 136  K 3.8 5.1  CL 100* 94*  CO2 30 34*  GLUCOSE 97 98  BUN 18 17  CREATININE 1.03 1.07  CALCIUM 9.0 9.4   Liver Function Tests:  Recent Labs  11/04/15 0549 11/05/15 0622  AST 25 25  ALT 22 21  ALKPHOS 86 90  BILITOT 0.4 1.5*  PROT 6.6 6.7  ALBUMIN 3.5 3.5    Recent Labs  11/04/15 0549  LIPASE 18   CBC:  Recent Labs  11/04/15 0549 11/05/15 0622  WBC 5.6 9.0  NEUTROABS 3.8  --   HGB 7.6* 14.0  HCT 23.3* 41.6  MCV 93.2 88.4  PLT 108* 138*   Cardiac Enzymes:  Recent Labs  11/04/15 1150 11/05/15 0049 11/05/15 0622  TROPONINI 0.09* 0.06* 0.06*   BNP: Invalid input(s): POCBNP D-Dimer: No results for input(s): DDIMER in the last 72 hours. Hemoglobin A1C: No results for input(s): HGBA1C in the last 72 hours. Fasting Lipid Panel: No results for input(s): CHOL,  HDL, LDLCALC, TRIG, CHOLHDL, LDLDIRECT in the last 72 hours. Thyroid Function Tests: No results for input(s): TSH, T4TOTAL, T3FREE, THYROIDAB in the last 72 hours.  Invalid input(s): FREET3 Anemia Panel: No results for input(s): VITAMINB12, FOLATE, FERRITIN, TIBC, IRON, RETICCTPCT in the last 72 hours.  Ct Angio Chest Pe W And/or Wo Contrast  Result Date: 11/04/2015 CLINICAL DATA:  Lung cancer with metastatic spread. Chest and abdominal/pelvic pain all week with worsening today. EXAM: CT ANGIOGRAPHY CHEST CT ABDOMEN AND PELVIS WITH CONTRAST TECHNIQUE: Multidetector CT imaging of the chest was performed using the standard protocol during bolus administration of intravenous contrast. Multiplanar CT image reconstructions and MIPs were obtained to evaluate the vascular anatomy. Multidetector CT imaging of the abdomen and pelvis was performed using the standard protocol during bolus administration of intravenous contrast. CONTRAST:  100 cc Isovue 370 COMPARISON:  PET-CT 09/06/2015.  Diagnostic CT 08/09/2015. FINDINGS: CTA CHEST FINDINGS Cardiovascular: The heart size is normal. No pericardial effusion. No thoracic aortic aneurysm no  filling defects in the opacified pulmonary arteries to suggest the presence of an acute pulmonary embolus Mediastinum/Nodes: Interval increase in irregular soft tissue associated with the right hilum. 12 mm short axis AP window lymph node on image 37 series 5 was 8 mm on the previous PET-CT 6 mm short axis prevascular lymph node today was 3 mm previously. 6 mm high right paratracheal lymph node (image 15) was 4 mm previously. 8 mm prevascular lymph node on image 18 was 4 mm previously. The esophagus has normal imaging features. Small right axillary and subpectoral lymph nodes have progressed in the interval. Left supraclavicular lymphadenopathy is progressed. 11 mm left supraclavicular lymph node today was 7 mm previously. Lungs/Pleura: Emphysema. Airspace consolidation medial  right lung compatible with post radiation change. 6 mm right lower lobe nodule on image 64 is more prominent in the interval. Scarring noted in the upper lungs bilaterally. Musculoskeletal: Bone windows reveal no worrisome lytic or sclerotic osseous lesions. Review of the MIP images confirms the above findings. CT ABDOMEN and PELVIS FINDINGS Hepatobiliary: No focal abnormality within the liver parenchyma. There is no evidence for gallstones, gallbladder wall thickening, or pericholecystic fluid. No intrahepatic or extrahepatic biliary dilation. Pancreas: No focal mass lesion. No dilatation of the main duct. No intraparenchymal cyst. No peripancreatic edema. Spleen: No splenomegaly. No focal mass lesion. Adrenals/Urinary Tract: No substantial change right adrenal lesion. 2.6 cm left adrenal nodule today measured 2.3 cm previously. Tiny hypo attenuating renal lesions are too small to characterize but likely represent cysts. No evidence for hydroureter. The urinary bladder appears normal for the degree of distention. Stomach/Bowel: Stomach is nondistended. No gastric wall thickening. No evidence of outlet obstruction. Duodenum is normally positioned as is the ligament of Treitz. No small bowel wall thickening. No small bowel dilatation. The terminal ileum is normal. The appendix is normal. No gross colonic mass. No colonic wall thickening. No substantial diverticular change. Vascular/Lymphatic: There is abdominal aortic atherosclerosis without aneurysm. IVC filter visualized in situ. Retroperitoneal lymphadenopathy is somewhat obscured by streak artifact from the patient's spinal hardware. No pelvic sidewall lymphadenopathy. Reproductive: The prostate gland and seminal vesicles have normal imaging features. Other: No intraperitoneal free fluid. Musculoskeletal: Status post lumbar fusion. Review of the MIP images confirms the above findings. IMPRESSION: 1. No CT evidence for acute pulmonary embolus. 2. Interval  progression of mediastinal, right axillary/subpectoral, and left supraclavicular lymphadenopathy. Increasing a amorphous soft tissue identified in the right hilum, also suspicious for disease progression. 3. 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 4. Retroperitoneal lymphadenopathy appears more prominent, but is difficult to discretely measure given streak artifact from the spinal hardware. Electronically Signed   By: Misty Stanley M.D.   On: 11/04/2015 08:22   Ct Abdomen Pelvis W Contrast  Result Date: 11/04/2015 CLINICAL DATA:  Lung cancer with metastatic spread. Chest and abdominal/pelvic pain all week with worsening today. EXAM: CT ANGIOGRAPHY CHEST CT ABDOMEN AND PELVIS WITH CONTRAST TECHNIQUE: Multidetector CT imaging of the chest was performed using the standard protocol during bolus administration of intravenous contrast. Multiplanar CT image reconstructions and MIPs were obtained to evaluate the vascular anatomy. Multidetector CT imaging of the abdomen and pelvis was performed using the standard protocol during bolus administration of intravenous contrast. CONTRAST:  100 cc Isovue 370 COMPARISON:  PET-CT 09/06/2015.  Diagnostic CT 08/09/2015. FINDINGS: CTA CHEST FINDINGS Cardiovascular: The heart size is normal. No pericardial effusion. No thoracic aortic aneurysm no filling defects in the opacified pulmonary arteries to suggest the presence of an  acute pulmonary embolus Mediastinum/Nodes: Interval increase in irregular soft tissue associated with the right hilum. 12 mm short axis AP window lymph node on image 37 series 5 was 8 mm on the previous PET-CT 6 mm short axis prevascular lymph node today was 3 mm previously. 6 mm high right paratracheal lymph node (image 15) was 4 mm previously. 8 mm prevascular lymph node on image 18 was 4 mm previously. The esophagus has normal imaging features. Small right axillary and subpectoral lymph nodes have progressed in the interval. Left  supraclavicular lymphadenopathy is progressed. 11 mm left supraclavicular lymph node today was 7 mm previously. Lungs/Pleura: Emphysema. Airspace consolidation medial right lung compatible with post radiation change. 6 mm right lower lobe nodule on image 64 is more prominent in the interval. Scarring noted in the upper lungs bilaterally. Musculoskeletal: Bone windows reveal no worrisome lytic or sclerotic osseous lesions. Review of the MIP images confirms the above findings. CT ABDOMEN and PELVIS FINDINGS Hepatobiliary: No focal abnormality within the liver parenchyma. There is no evidence for gallstones, gallbladder wall thickening, or pericholecystic fluid. No intrahepatic or extrahepatic biliary dilation. Pancreas: No focal mass lesion. No dilatation of the main duct. No intraparenchymal cyst. No peripancreatic edema. Spleen: No splenomegaly. No focal mass lesion. Adrenals/Urinary Tract: No substantial change right adrenal lesion. 2.6 cm left adrenal nodule today measured 2.3 cm previously. Tiny hypo attenuating renal lesions are too small to characterize but likely represent cysts. No evidence for hydroureter. The urinary bladder appears normal for the degree of distention. Stomach/Bowel: Stomach is nondistended. No gastric wall thickening. No evidence of outlet obstruction. Duodenum is normally positioned as is the ligament of Treitz. No small bowel wall thickening. No small bowel dilatation. The terminal ileum is normal. The appendix is normal. No gross colonic mass. No colonic wall thickening. No substantial diverticular change. Vascular/Lymphatic: There is abdominal aortic atherosclerosis without aneurysm. IVC filter visualized in situ. Retroperitoneal lymphadenopathy is somewhat obscured by streak artifact from the patient's spinal hardware. No pelvic sidewall lymphadenopathy. Reproductive: The prostate gland and seminal vesicles have normal imaging features. Other: No intraperitoneal free fluid.  Musculoskeletal: Status post lumbar fusion. Review of the MIP images confirms the above findings. IMPRESSION: 1. No CT evidence for acute pulmonary embolus. 2. Interval progression of mediastinal, right axillary/subpectoral, and left supraclavicular lymphadenopathy. Increasing a amorphous soft tissue identified in the right hilum, also suspicious for disease progression. 3. 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 4. Retroperitoneal lymphadenopathy appears more prominent, but is difficult to discretely measure given streak artifact from the spinal hardware. Electronically Signed   By: Misty Stanley M.D.   On: 11/04/2015 08:22   Dg Chest Port 1 View  Result Date: 11/04/2015 CLINICAL DATA:  Left-sided chest pain. Pain for 1 week, worse tonight. EXAM: PORTABLE CHEST 1 VIEW COMPARISON:  Radiographs 10/13/2015, PET-CT 09/06/2015 FINDINGS: Tip of the right chest port in the SVC. Unchanged heart size and mediastinal contours. Stable streaky right midlung zone opacity most consistent with postradiation change. Resolved adjacent consolidation. No new focal airspace disease. No pleural fluid, pulmonary edema or pneumothorax. IMPRESSION: Resolved right lung opacity adjacent to postradiation change. No new abnormality is seen. Electronically Signed   By: Jeb Levering M.D.   On: 11/04/2015 06:09     Echo   TELEMETRY: Sinus rhythm:  ASSESSMENT AND PLAN:  Principal Problem:   Symptomatic anemia Active Problems:   Adenocarcinoma of lung, stage 4 (HCC)   Chest pain   Tachycardia    1. Pleuritic chest  pain, nondiagnostic ECG, negative troponin, likely noncardiac 2. Metastatic lung cancer 3. Anemia, improved after transfusion  Recommendations  1. Agree with current therapy 2. Defer full dose anticoagulation 3. Defer further cardiac diagnostics at this time  Signed off for now, please call if any questions    Maiko Salais, MD, PhD, [facility] 11/05/2015 11:08 AM

## 2015-11-05 NOTE — Discharge Summary (Addendum)
[facility] [place] NAME: Darryl Hatfield    MR#:  [id number]  DATE OF BIRTH:  1960/02/21  DATE OF ADMISSION:  11/04/2015 ADMITTING PHYSICIAN: Idelle Crouch, MD  DATE OF DISCHARGE: 11/05/15  PRIMARY CARE PHYSICIAN: Marinda Elk, MD    ADMISSION DIAGNOSIS:  Generalized abdominal pain [R10.84] Chest pain [R07.9] Symptomatic anemia [D64.9] Sepsis, due to unspecified organism ([place]) [A41.9] Chest pain, unspecified type [R07.9]  DISCHARGE DIAGNOSIS:  Chest pain-atypical Anemia chronic s/p 2 unit BT Metastatic Lung cancer  SECONDARY DIAGNOSIS:   Past Medical History:  Diagnosis Date  . CHF (congestive heart failure) ([place])   . COPD (chronic obstructive pulmonary disease) ([place])   . Hypothyroidism   . Lung cancer ([place]) 2015   RIght   . Seizures [facility])     HOSPITAL COURSE:  Darryl Hatfield is a 55 y.o. male has a past medical history significant for metastatic lung CA, ASCVD, CHF, and COPD. Presents now with progressive CP and SOB. In the ER, pt found to be anemic. CT chest/abd/pelvis stable except for progressive tumor burden  1. Chest pain appears atypical -seen by Dr Josefa Half and recommends no further w/u since cath in aug 2017 showed insignificant CAD -cont cardiac meds  2.metastatic lung cancer with chronic pain -prn oxycodone -spoke with wife mary and pt has enough pain meds  3.COPD with chronic cough -wean to RA -no wheezing -on po steroid taper (from out pt) -no resp distress. No PNA on cxr  4.acute on chronic anemia (multifactorail) -received 2 units BT y'day -hgb 7.6---14.6  5.gen weakness Pt ambulates w/o cane or walker and able to carry out his ADL's  Overall at baseline Spoke with wife [name] on phone D/w Dr B   CONSULTS OBTAINED:  Treatment Team:  Cammie Sickle, MD Isaias Cowman, MD  DRUG ALLERGIES:   Allergies  Allergen Reactions  . No Known Allergies     DISCHARGE  MEDICATIONS:   Current Discharge Medication List    CONTINUE these medications which have NOT CHANGED   Details  acetaminophen (TYLENOL) 325 MG tablet Take 650 mg by mouth every 6 (six) hours as needed for mild pain.     albuterol (PROVENTIL) (2.5 MG/3ML) 0.083% nebulizer solution Take 3 mLs (2.5 mg total) by nebulization every 6 (six) hours as needed for wheezing or shortness of breath. Qty: 75 mL, Refills: 2    aspirin 81 MG EC tablet Take 81 mg by mouth daily.  Refills: 0    Calcium Carbonate-Vitamin D 600-400 MG-UNIT tablet Take 1 tablet by mouth 2 (two) times daily.     DULoxetine (CYMBALTA) 30 MG capsule Take 60 mg by mouth daily.     enoxaparin (LOVENOX) 60 MG/0.6ML injection Inject 0.6 mLs (60 mg total) into the skin every 12 (twelve) hours. Qty: 60 Syringe, Refills: 0    folic acid (FOLVITE) 1 MG tablet TAKE 1 TABLET BY MOUTH DAILY START 5-7 DAYS BEFORE ALIMTA CHEMO, CONTINUE UNTIL 21 DAYS AFTER ALIMTA Qty: 100 tablet, Refills: 3   Associated Diagnoses: Adenocarcinoma of lung, stage 4, unspecified laterality (HCC)    gabapentin (NEURONTIN) 100 MG capsule Take 1 capsule (100 mg total) by mouth 3 (three) times daily. Qty: 90 capsule, Refills: 4   Associated Diagnoses: Sciatica, unspecified laterality    hydrOXYzine (ATARAX/VISTARIL) 10 MG tablet Take 1 tablet (10 mg total) by mouth 3 (three) times daily as needed for itching. Qty: 60 tablet, Refills: 1  levothyroxine (SYNTHROID, LEVOTHROID) 88 MCG tablet Take 1 tablet (88 mcg total) by mouth daily before breakfast. Qty: 30 tablet, Refills: 0    LORazepam (ATIVAN) 0.5 MG tablet Take 1 tablet (0.5 mg total) by mouth every 8 (eight) hours as needed for anxiety. Qty: 30 tablet, Refills: 0    metoCLOPramide (REGLAN) 10 MG tablet Take 1 tablet (10 mg total) by mouth every 6 (six) hours as needed. Qty: 30 tablet, Refills: 0    metoprolol succinate (TOPROL-XL) 25 MG 24 hr tablet Take 25 mg by mouth 2 (two) times daily.   Refills: 2    nitroGLYCERIN (NITROSTAT) 0.4 MG SL tablet Place 0.4 mg under the tongue every 5 (five) minutes as needed.  Refills: 0    ondansetron (ZOFRAN) 8 MG tablet Take 1 tablet (8 mg total) by mouth every 8 (eight) hours as needed for nausea or vomiting. Qty: 30 tablet, Refills: 3   Associated Diagnoses: Adenocarcinoma of lung, stage 4, right (HCC)    oxyCODONE (OXY IR/ROXICODONE) 5 MG immediate release tablet Take 1 tablet (5 mg total) by mouth every 4 (four) hours as needed for severe pain. Qty: 30 tablet, Refills: 0    predniSONE (DELTASONE) 10 MG tablet 10/5-10/8 - take 4 tabs; 10/9-10/15 - take 3 tabs; 10/16-10/22 - take 2 tabs; 10/23-10/29 - take 1 tabs    QUEtiapine (SEROQUEL) 50 MG tablet Take 50 mg by mouth daily.     sulfamethoxazole-trimethoprim (BACTRIM DS,SEPTRA DS) 800-160 MG tablet Take 1 tablet by mouth once. Monday, Wednesday, and Friday for 10 days    valACYclovir (VALTREX) 500 MG tablet Take 500 mg by mouth daily.     aluminum-magnesium hydroxide-simethicone (MAALOX) 865-784-69 MG/5ML SUSP Take 30 mLs by mouth 4 (four) times daily -  before meals and at bedtime. Qty: 355 mL, Refills: 0    atorvastatin (LIPITOR) 80 MG tablet Take 80 mg by mouth daily.     pantoprazole (PROTONIX) 40 MG tablet Take 40 mg by mouth daily.       STOP taking these medications     simethicone (MYLICON) 80 MG chewable tablet         If you experience worsening of your admission symptoms, develop shortness of breath, life threatening emergency, suicidal or homicidal thoughts you must seek medical attention immediately by calling 911 or calling your MD immediately  if symptoms less severe.  You Must read complete instructions/literature along with all the possible adverse reactions/side effects for all the Medicines you take and that have been prescribed to you. Take any new Medicines after you have completely understood and accept all the possible adverse reactions/side effects.    Please note  You were cared for by a hospitalist during your hospital stay. If you have any questions about your discharge medications or the care you received while you were in the hospital after you are discharged, you can call the unit and asked to speak with the hospitalist on call if the hospitalist that took care of you is not available. Once you are discharged, your primary care physician will handle any further medical issues. Please note that NO REFILLS for any discharge medications will be authorized once you are discharged, as it is imperative that you return to your primary care physician (or establish a relationship with a primary care physician if you do not have one) for your aftercare needs so that they can reassess your need for medications and monitor your lab values. Today   SUBJECTIVE   weakness  VITAL SIGNS:  Blood pressure 112/73, pulse 95, temperature 98.9 F (37.2 C), resp. rate 17, height '5\' 8"'$  (1.727 m), weight 64.7 kg (142 lb 9.6 oz), SpO2 95 %.  I/O:    Intake/Output Summary (Last 24 hours) at 11/05/15 1312 Last data filed at 11/05/15 0800  Gross per 24 hour  Intake             1434 ml  Output                0 ml  Net             1434 ml    PHYSICAL EXAMINATION:  GENERAL:  55 y.o.-year-old patient lying in the bed with no acute distress.  EYES: Pupils equal, round, reactive to light and accommodation. No scleral icterus. Extraocular muscles intact.  HEENT: Head atraumatic, normocephalic. Oropharynx and nasopharynx clear.  NECK:  Supple, no jugular venous distention. No thyroid enlargement, no tenderness.  LUNGS: Normal breath sounds bilaterally, no wheezing, rales,rhonchi or crepitation. No use of accessory muscles of respiration.  CARDIOVASCULAR: S1, S2 normal. No murmurs, rubs, or gallops.  ABDOMEN: Soft, non-tender, non-distended. Bowel sounds present. No organomegaly or mass.  EXTREMITIES: No pedal edema, cyanosis, or clubbing. Bruises and  echymosis NEUROLOGIC: Cranial nerves II through XII are intact. Muscle strength 5/5 in all extremities. Sensation intact. Gait not checked.  PSYCHIATRIC: The patient is alert and oriented x 3.  SKIN: No obvious rash, lesion, or ulcer.   DATA REVIEW:   CBC   Recent Labs Lab 11/05/15 0622  WBC 9.0  HGB 14.0  HCT 41.6  PLT 138*    Chemistries   Recent Labs Lab 11/05/15 0622  NA 136  K 5.1  CL 94*  CO2 34*  GLUCOSE 98  BUN 17  CREATININE 1.07  CALCIUM 9.4  AST 25  ALT 21  ALKPHOS 90  BILITOT 1.5*    Microbiology Results   Recent Results (from the past 240 hour(s))  Blood culture (routine x 2)     Status: None (Preliminary result)   Collection Time: 11/04/15  6:15 AM  Result Value Ref Range Status   Specimen Description BLOOD RIGHT FOREARM  Final   Special Requests BOTTLES DRAWN AEROBIC AND ANAEROBIC 8CC  Final   Culture NO GROWTH 1 DAY  Final   Report Status PENDING  Incomplete  Blood culture (routine x 2)     Status: None (Preliminary result)   Collection Time: 11/04/15  6:15 AM  Result Value Ref Range Status   Specimen Description BLOOD PORT  Final   Special Requests BOTTLES DRAWN AEROBIC AND ANAEROBIC 8CC  Final   Culture NO GROWTH 1 DAY  Final   Report Status PENDING  Incomplete    RADIOLOGY:  Ct Angio Chest Pe W And/or Wo Contrast  Result Date: 11/04/2015 CLINICAL DATA:  Lung cancer with metastatic spread. Chest and abdominal/pelvic pain all week with worsening today. EXAM: CT ANGIOGRAPHY CHEST CT ABDOMEN AND PELVIS WITH CONTRAST TECHNIQUE: Multidetector CT imaging of the chest was performed using the standard protocol during bolus administration of intravenous contrast. Multiplanar CT image reconstructions and MIPs were obtained to evaluate the vascular anatomy. Multidetector CT imaging of the abdomen and pelvis was performed using the standard protocol during bolus administration of intravenous contrast. CONTRAST:  100 cc Isovue 370 COMPARISON:  PET-CT  09/06/2015.  Diagnostic CT 08/09/2015. FINDINGS: CTA CHEST FINDINGS Cardiovascular: The heart size is normal. No pericardial effusion. No thoracic aortic aneurysm no filling defects in  the opacified pulmonary arteries to suggest the presence of an acute pulmonary embolus Mediastinum/Nodes: Interval increase in irregular soft tissue associated with the right hilum. 12 mm short axis AP window lymph node on image 37 series 5 was 8 mm on the previous PET-CT 6 mm short axis prevascular lymph node today was 3 mm previously. 6 mm high right paratracheal lymph node (image 15) was 4 mm previously. 8 mm prevascular lymph node on image 18 was 4 mm previously. The esophagus has normal imaging features. Small right axillary and subpectoral lymph nodes have progressed in the interval. Left supraclavicular lymphadenopathy is progressed. 11 mm left supraclavicular lymph node today was 7 mm previously. Lungs/Pleura: Emphysema. Airspace consolidation medial right lung compatible with post radiation change. 6 mm right lower lobe nodule on image 64 is more prominent in the interval. Scarring noted in the upper lungs bilaterally. Musculoskeletal: Bone windows reveal no worrisome lytic or sclerotic osseous lesions. Review of the MIP images confirms the above findings. CT ABDOMEN and PELVIS FINDINGS Hepatobiliary: No focal abnormality within the liver parenchyma. There is no evidence for gallstones, gallbladder wall thickening, or pericholecystic fluid. No intrahepatic or extrahepatic biliary dilation. Pancreas: No focal mass lesion. No dilatation of the main duct. No intraparenchymal cyst. No peripancreatic edema. Spleen: No splenomegaly. No focal mass lesion. Adrenals/Urinary Tract: No substantial change right adrenal lesion. 2.6 cm left adrenal nodule today measured 2.3 cm previously. Tiny hypo attenuating renal lesions are too small to characterize but likely represent cysts. No evidence for hydroureter. The urinary bladder appears  normal for the degree of distention. Stomach/Bowel: Stomach is nondistended. No gastric wall thickening. No evidence of outlet obstruction. Duodenum is normally positioned as is the ligament of Treitz. No small bowel wall thickening. No small bowel dilatation. The terminal ileum is normal. The appendix is normal. No gross colonic mass. No colonic wall thickening. No substantial diverticular change. Vascular/Lymphatic: There is abdominal aortic atherosclerosis without aneurysm. IVC filter visualized in situ. Retroperitoneal lymphadenopathy is somewhat obscured by streak artifact from the patient's spinal hardware. No pelvic sidewall lymphadenopathy. Reproductive: The prostate gland and seminal vesicles have normal imaging features. Other: No intraperitoneal free fluid. Musculoskeletal: Status post lumbar fusion. Review of the MIP images confirms the above findings. IMPRESSION: 1. No CT evidence for acute pulmonary embolus. 2. Interval progression of mediastinal, right axillary/subpectoral, and left supraclavicular lymphadenopathy. Increasing a amorphous soft tissue identified in the right hilum, also suspicious for disease progression. 3. 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 4. Retroperitoneal lymphadenopathy appears more prominent, but is difficult to discretely measure given streak artifact from the spinal hardware. Electronically Signed   By: Misty Stanley M.D.   On: 11/04/2015 08:22   Ct Abdomen Pelvis W Contrast  Result Date: 11/04/2015 CLINICAL DATA:  Lung cancer with metastatic spread. Chest and abdominal/pelvic pain all week with worsening today. EXAM: CT ANGIOGRAPHY CHEST CT ABDOMEN AND PELVIS WITH CONTRAST TECHNIQUE: Multidetector CT imaging of the chest was performed using the standard protocol during bolus administration of intravenous contrast. Multiplanar CT image reconstructions and MIPs were obtained to evaluate the vascular anatomy. Multidetector CT imaging of the abdomen and  pelvis was performed using the standard protocol during bolus administration of intravenous contrast. CONTRAST:  100 cc Isovue 370 COMPARISON:  PET-CT 09/06/2015.  Diagnostic CT 08/09/2015. FINDINGS: CTA CHEST FINDINGS Cardiovascular: The heart size is normal. No pericardial effusion. No thoracic aortic aneurysm no filling defects in the opacified pulmonary arteries to suggest the presence of an acute pulmonary embolus  Mediastinum/Nodes: Interval increase in irregular soft tissue associated with the right hilum. 12 mm short axis AP window lymph node on image 37 series 5 was 8 mm on the previous PET-CT 6 mm short axis prevascular lymph node today was 3 mm previously. 6 mm high right paratracheal lymph node (image 15) was 4 mm previously. 8 mm prevascular lymph node on image 18 was 4 mm previously. The esophagus has normal imaging features. Small right axillary and subpectoral lymph nodes have progressed in the interval. Left supraclavicular lymphadenopathy is progressed. 11 mm left supraclavicular lymph node today was 7 mm previously. Lungs/Pleura: Emphysema. Airspace consolidation medial right lung compatible with post radiation change. 6 mm right lower lobe nodule on image 64 is more prominent in the interval. Scarring noted in the upper lungs bilaterally. Musculoskeletal: Bone windows reveal no worrisome lytic or sclerotic osseous lesions. Review of the MIP images confirms the above findings. CT ABDOMEN and PELVIS FINDINGS Hepatobiliary: No focal abnormality within the liver parenchyma. There is no evidence for gallstones, gallbladder wall thickening, or pericholecystic fluid. No intrahepatic or extrahepatic biliary dilation. Pancreas: No focal mass lesion. No dilatation of the main duct. No intraparenchymal cyst. No peripancreatic edema. Spleen: No splenomegaly. No focal mass lesion. Adrenals/Urinary Tract: No substantial change right adrenal lesion. 2.6 cm left adrenal nodule today measured 2.3 cm previously.  Tiny hypo attenuating renal lesions are too small to characterize but likely represent cysts. No evidence for hydroureter. The urinary bladder appears normal for the degree of distention. Stomach/Bowel: Stomach is nondistended. No gastric wall thickening. No evidence of outlet obstruction. Duodenum is normally positioned as is the ligament of Treitz. No small bowel wall thickening. No small bowel dilatation. The terminal ileum is normal. The appendix is normal. No gross colonic mass. No colonic wall thickening. No substantial diverticular change. Vascular/Lymphatic: There is abdominal aortic atherosclerosis without aneurysm. IVC filter visualized in situ. Retroperitoneal lymphadenopathy is somewhat obscured by streak artifact from the patient's spinal hardware. No pelvic sidewall lymphadenopathy. Reproductive: The prostate gland and seminal vesicles have normal imaging features. Other: No intraperitoneal free fluid. Musculoskeletal: Status post lumbar fusion. Review of the MIP images confirms the above findings. IMPRESSION: 1. No CT evidence for acute pulmonary embolus. 2. Interval progression of mediastinal, right axillary/subpectoral, and left supraclavicular lymphadenopathy. Increasing a amorphous soft tissue identified in the right hilum, also suspicious for disease progression. 3. 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 4. Retroperitoneal lymphadenopathy appears more prominent, but is difficult to discretely measure given streak artifact from the spinal hardware. Electronically Signed   By: Misty Stanley M.D.   On: 11/04/2015 08:22   Dg Chest Port 1 View  Result Date: 11/04/2015 CLINICAL DATA:  Left-sided chest pain. Pain for 1 week, worse tonight. EXAM: PORTABLE CHEST 1 VIEW COMPARISON:  Radiographs 10/13/2015, PET-CT 09/06/2015 FINDINGS: Tip of the right chest port in the SVC. Unchanged heart size and mediastinal contours. Stable streaky right midlung zone opacity most consistent with  postradiation change. Resolved adjacent consolidation. No new focal airspace disease. No pleural fluid, pulmonary edema or pneumothorax. IMPRESSION: Resolved right lung opacity adjacent to postradiation change. No new abnormality is seen. Electronically Signed   By: Jeb Levering M.D.   On: 11/04/2015 06:09     Management plans discussed with the patient, family and they are in agreement.  CODE STATUS:     Code Status Orders        Start     Ordered   11/04/15 1140  Full code  Continuous     11/04/15 1140    Code Status History    Date Active Date Inactive Code Status Order ID Comments User Context   12/25/2014  4:39 AM 12/26/2014  6:04 PM Full Code [id number]  Harrie Foreman, MD Inpatient    Advance Directive Documentation   Flowsheet Row Most Recent Value  Type of Advance Directive  Healthcare Power of Attorney  Pre-existing out of facility DNR order (yellow form or pink MOST form)  No data  "MOST" Form in Place?  No data      TOTAL TIME TAKING CARE OF THIS PATIENT: 40 minutes.    Nicklas Mcsweeney M.D on 11/05/2015 at 1:12 PM  Between 7am to 6pm - Pager - [phone] After 6pm go to [email or web address] - password [email or web address]  [facility]  Office  [phone]  CC: Primary care physician; Marinda Elk, MD

## 2015-11-06 ENCOUNTER — Telehealth: Payer: Self-pay | Admitting: *Deleted

## 2015-11-06 NOTE — Telephone Encounter (Signed)
Was in hospital over weekend for chest pain and got blood, heart checked out ok and was discharged, but continues to have chest pain. Asking that Dr Darryl Hatfield look over all his tests and call to discuss why he is having Chest pain.

## 2015-11-06 NOTE — Telephone Encounter (Signed)
Per D rCorcoran , he is to call Cardiologist,[name] informed and she will call Dr Josefa Half to get an appt

## 2015-11-09 LAB — CULTURE, BLOOD (ROUTINE X 2)
CULTURE: NO GROWTH
Culture: NO GROWTH

## 2015-11-12 ENCOUNTER — Encounter: Payer: Self-pay | Admitting: Hematology and Oncology

## 2015-11-14 ENCOUNTER — Encounter: Payer: Self-pay | Admitting: Hematology and Oncology

## 2015-11-15 ENCOUNTER — Inpatient Hospital Stay (HOSPITAL_BASED_OUTPATIENT_CLINIC_OR_DEPARTMENT_OTHER): Payer: BLUE CROSS/BLUE SHIELD | Admitting: Hematology and Oncology

## 2015-11-15 ENCOUNTER — Encounter: Payer: Self-pay | Admitting: Hematology and Oncology

## 2015-11-15 ENCOUNTER — Other Ambulatory Visit: Payer: Self-pay | Admitting: Hematology and Oncology

## 2015-11-15 ENCOUNTER — Inpatient Hospital Stay: Payer: BLUE CROSS/BLUE SHIELD

## 2015-11-15 VITALS — BP 116/80 | HR 101 | Temp 96.7°F | Resp 18 | Wt 136.2 lb

## 2015-11-15 DIAGNOSIS — K529 Noninfective gastroenteritis and colitis, unspecified: Secondary | ICD-10-CM

## 2015-11-15 DIAGNOSIS — C778 Secondary and unspecified malignant neoplasm of lymph nodes of multiple regions: Secondary | ICD-10-CM | POA: Diagnosis not present

## 2015-11-15 DIAGNOSIS — C7800 Secondary malignant neoplasm of unspecified lung: Secondary | ICD-10-CM | POA: Diagnosis not present

## 2015-11-15 DIAGNOSIS — R5383 Other fatigue: Secondary | ICD-10-CM

## 2015-11-15 DIAGNOSIS — C7972 Secondary malignant neoplasm of left adrenal gland: Secondary | ICD-10-CM

## 2015-11-15 DIAGNOSIS — D509 Iron deficiency anemia, unspecified: Secondary | ICD-10-CM

## 2015-11-15 DIAGNOSIS — C7971 Secondary malignant neoplasm of right adrenal gland: Secondary | ICD-10-CM

## 2015-11-15 DIAGNOSIS — R0789 Other chest pain: Secondary | ICD-10-CM

## 2015-11-15 DIAGNOSIS — Z923 Personal history of irradiation: Secondary | ICD-10-CM

## 2015-11-15 DIAGNOSIS — C3490 Malignant neoplasm of unspecified part of unspecified bronchus or lung: Secondary | ICD-10-CM

## 2015-11-15 DIAGNOSIS — C7970 Secondary malignant neoplasm of unspecified adrenal gland: Secondary | ICD-10-CM

## 2015-11-15 DIAGNOSIS — Z79899 Other long term (current) drug therapy: Secondary | ICD-10-CM

## 2015-11-15 DIAGNOSIS — G8929 Other chronic pain: Secondary | ICD-10-CM

## 2015-11-15 DIAGNOSIS — E860 Dehydration: Secondary | ICD-10-CM

## 2015-11-15 DIAGNOSIS — C3411 Malignant neoplasm of upper lobe, right bronchus or lung: Secondary | ICD-10-CM

## 2015-11-15 DIAGNOSIS — B029 Zoster without complications: Secondary | ICD-10-CM

## 2015-11-15 DIAGNOSIS — Z9221 Personal history of antineoplastic chemotherapy: Secondary | ICD-10-CM

## 2015-11-15 LAB — CBC WITH DIFFERENTIAL/PLATELET
Basophils Absolute: 0.1 10*3/uL (ref 0–0.1)
Basophils Relative: 1 %
Eosinophils Absolute: 0.1 10*3/uL (ref 0–0.7)
Eosinophils Relative: 1 %
HCT: 40.4 % (ref 40.0–52.0)
Hemoglobin: 13.2 g/dL (ref 13.0–18.0)
Lymphocytes Relative: 14 %
Lymphs Abs: 1.3 10*3/uL (ref 1.0–3.6)
MCH: 29.4 pg (ref 26.0–34.0)
MCHC: 32.6 g/dL (ref 32.0–36.0)
MCV: 90.3 fL (ref 80.0–100.0)
Monocytes Absolute: 0.6 10*3/uL (ref 0.2–1.0)
Monocytes Relative: 6 %
Neutro Abs: 7.5 10*3/uL — ABNORMAL HIGH (ref 1.4–6.5)
Neutrophils Relative %: 78 %
Platelets: 137 10*3/uL — ABNORMAL LOW (ref 150–440)
RBC: 4.47 MIL/uL (ref 4.40–5.90)
RDW: 15.9 % — ABNORMAL HIGH (ref 11.5–14.5)
WBC: 9.6 10*3/uL (ref 3.8–10.6)

## 2015-11-15 LAB — COMPREHENSIVE METABOLIC PANEL
ALT: 23 U/L (ref 17–63)
AST: 25 U/L (ref 15–41)
Albumin: 3.8 g/dL (ref 3.5–5.0)
Alkaline Phosphatase: 142 U/L — ABNORMAL HIGH (ref 38–126)
Anion gap: 12 (ref 5–15)
BUN: 21 mg/dL — ABNORMAL HIGH (ref 6–20)
CO2: 30 mmol/L (ref 22–32)
Calcium: 9.8 mg/dL (ref 8.9–10.3)
Chloride: 94 mmol/L — ABNORMAL LOW (ref 101–111)
Creatinine, Ser: 1.28 mg/dL — ABNORMAL HIGH (ref 0.61–1.24)
GFR calc Af Amer: >60 mL/min (ref >60)
GFR calc non Af Amer: >60 mL/min (ref >60)
Glucose, Bld: 136 mg/dL — ABNORMAL HIGH (ref 65–99)
Potassium: 3.9 mmol/L (ref 3.5–5.1)
Sodium: 136 mmol/L (ref 135–145)
Total Bilirubin: 0.7 mg/dL (ref 0.3–1.2)
Total Protein: 7 g/dL (ref 6.5–8.1)

## 2015-11-15 MED ORDER — OXYCODONE HCL 5 MG PO TABS: 5.0000 mg | ORAL_TABLET | ORAL | 0 refills | Status: DC | PRN

## 2015-11-15 NOTE — Progress Notes (Signed)
Patient states he is having pain in his right chest.  Started in the left side of chest, moved to middle of chest and now in left side.  Describes it as sharp pain, and worsens when he inhales.  Also requesting refill for Oxycodone.

## 2015-11-15 NOTE — Progress Notes (Signed)
Darryl Hatfield:  11/15/15    Chief Complaint: Darryl Hatfield is a 55 y.o. 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  HPI:  The patient was last seen in the medical oncology clinic on 10/30/2015.  At that time, he was doing well on a steroid taper.  His prednisone was down to 30 mg a Hatfield.  He denied any abdominal pain or diarrhea.  We discussed continuation of his steroid taper.  He was admitted to [facility]  from 11/04/2015 - 11/05/2015 with generalized abdominal pain and chest pain.  He presented with chest pain and shortness of breath.  He was felt to have non-cardiac chest pain.  Hemoglobin was 7.6.  He was transfused with 2 units of PRBCs.  Discharge hemoglobin was 14 with a hematocrit of 41.6.  Chest CT angiogram on 11/04/2015 revealed no evidence of pulmonary embolism.  There was interval progression of mediastinal, right axillary/subpectoral, and left supraclavicular lymphadenopathy.  There was increasing amorphous soft tissue identified in the right hilum, also suspicious for disease progression.  There was bilateral adrenal metastases, progressed on the left.  There was retroperitoneal lymphadenopathy which appeared more prominent.  Symptomatically, he notes discomfort on the right side of his chest.  He is not doing his nebulizers.  He has bruising on his arms.  He denies any shortness of breath or chest pain.  He denies any diarrhea. His prednisone will be decreased to 10 mg on 11/17/2015.   Past Medical History:  Diagnosis Date  . CHF (congestive heart failure) ([place])   . COPD (chronic obstructive pulmonary disease) ([place])   . Hypothyroidism   . Lung cancer ([place]) 2015   RIght   . Seizures ([place])     Past Surgical History:  Procedure Laterality Date  . BACK SURGERY    . COLONOSCOPY  10/2015  . PORTACATH PLACEMENT Right     Family History   Problem Relation Age of Onset  . Hypertension Other   . Heart failure Other   . Cancer Father     Social History:  reports that he quit smoking about 2 years ago. He has a 40.00 pack-year smoking history. He has never used smokeless tobacco. He reports that he does not drink alcohol or use drugs.  He is on disability.  He is accompanied by his wife, [name].  Allergies:  Allergies  Allergen Reactions  . No Known Allergies     Current Medications: Current Outpatient Prescriptions  Medication Sig Dispense Refill  . acetaminophen (TYLENOL) 325 MG tablet Take 650 mg by mouth every 6 (six) hours as needed for mild pain.     Marland Kitchen albuterol (PROVENTIL) (2.5 MG/3ML) 0.083% nebulizer solution Take 3 mLs (2.5 mg total) by nebulization every 6 (six) hours as needed for wheezing or shortness of breath. (Patient taking differently: Take 2.5 mg by nebulization 2 (two) times daily. ) 75 mL 2  . aspirin 81 MG EC tablet Take 81 mg by mouth daily.   0  . Calcium Carbonate-Vitamin D 600-400 MG-UNIT tablet Take 1 tablet by mouth 2 (two) times daily.     . DULoxetine (CYMBALTA) 30 MG capsule Take 60 mg by mouth daily.     Marland Kitchen enoxaparin (LOVENOX) 60 MG/0.6ML injection Inject into the skin.    . folic acid (FOLVITE) 1 MG tablet TAKE 1 TABLET BY MOUTH DAILY START 5-7  DAYS BEFORE ALIMTA CHEMO, CONTINUE UNTIL 21 DAYS AFTER ALIMTA 100 tablet 3  . gabapentin (NEURONTIN) 100 MG capsule Take 1 capsule (100 mg total) by mouth 3 (three) times daily. 90 capsule 4  . hydrOXYzine (ATARAX/VISTARIL) 10 MG tablet Take 1 tablet (10 mg total) by mouth 3 (three) times daily as needed for itching. 60 tablet 1  . levothyroxine (SYNTHROID, LEVOTHROID) 88 MCG tablet Take 1 tablet (88 mcg total) by mouth daily before breakfast. (Patient taking differently: Take 44 mcg by mouth daily before breakfast. ) 30 tablet 0  . LORazepam (ATIVAN) 0.5 MG tablet Take 1 tablet (0.5 mg total) by mouth every 8 (eight) hours as needed for anxiety. 30  tablet 0  . metoCLOPramide (REGLAN) 10 MG tablet Take 1 tablet (10 mg total) by mouth every 6 (six) hours as needed. 30 tablet 0  . metoprolol succinate (TOPROL-XL) 25 MG 24 hr tablet Take 25 mg by mouth 2 (two) times daily.   2  . nitroGLYCERIN (NITROSTAT) 0.4 MG SL tablet Place 0.4 mg under the tongue every 5 (five) minutes as needed.   0  . ondansetron (ZOFRAN) 8 MG tablet Take 1 tablet (8 mg total) by mouth every 8 (eight) hours as needed for nausea or vomiting. 30 tablet 3  . oxyCODONE (OXY IR/ROXICODONE) 5 MG immediate release tablet Take 1 tablet (5 mg total) by mouth every 4 (four) hours as needed for severe pain. 30 tablet 0  . predniSONE (DELTASONE) 10 MG tablet 10/5-10/8 - take 4 tabs; 10/9-10/15 - take 3 tabs; 10/16-10/22 - take 2 tabs; 10/23-10/29 - take 1 tabs    . QUEtiapine (SEROQUEL) 50 MG tablet Take 50 mg by mouth daily.     . traMADol-acetaminophen (ULTRACET) 37.5-325 MG tablet Take 1 tablet by mouth every 6 (six) hours as needed.    . valACYclovir (VALTREX) 500 MG tablet Take 500 mg by mouth daily.     Marland Kitchen atorvastatin (LIPITOR) 80 MG tablet Take 80 mg by mouth daily.     Marland Kitchen enoxaparin (LOVENOX) 60 MG/0.6ML injection Inject 0.6 mLs (60 mg total) into the skin every 12 (twelve) hours. 60 Syringe 0  . pantoprazole (PROTONIX) 40 MG tablet Take 40 mg by mouth daily.      No current facility-administered medications for this visit.    Facility-Administered Medications Ordered in Other Visits  Medication Dose Route Frequency Provider Last Rate Last Dose  . sodium chloride 0.9 % injection 10 mL  10 mL Intracatheter PRN Forest Gleason, MD   10 mL at 06/27/14 1338  . sodium chloride 0.9 % injection 10 mL  10 mL Intracatheter PRN Leia Alf, MD   10 mL at 08/22/14 0851  . sodium chloride flush (NS) 0.9 % injection 10 mL  10 mL Intravenous PRN Lequita Asal, MD        Review of Systems:  GENERAL:  Feels"ok".  No fever or chills.  No sweats.  Weight down 3 pounds. PERFORMANCE  STATUS (ECOG):  2 HEENT:  No visual changes, runny nose, sore throat, mouth sores or tenderness. Lungs:  No shortness of breath.  No cough.  No hemoptysis. Cardiac:  No chest pain, palpitations, orthopnea, or PND. GI:  No abdominal pain.  No nausea, vomiting, diarrhea, melena or hematochezia.  GU:  No urgency, frequency, dysuria, or hematuria. Musculoskeletal:  Right sided chest/rib pain.  Back pain.  Knee pain.  No muscle tenderness. Extremities:  No swelling or pain. Skin:  Bruising on arms and abdomen.  No rashes. Neuro:  No headache, numbness or weakness, balance or coordination issues. Endocrine:  No diabetes. Thyroid disease on Synthroid.  No hot flashes or night sweats. Psych:  High anxiety (stable).  No mood changes or depression. Pain:  No focal pain, currently. Review of systems:  All other systems reviewed and found to be negative.  Physical Exam: Blood pressure 116/80, pulse (!) 101, temperature (!) 96.7 F (35.9 C), temperature source Tympanic, resp. rate 18, weight 136 lb 3.9 oz (61.8 kg). GENERAL:  Thin gentleman sitting in the exam room in no acute distress.  MENTAL STATUS:  Alert and oriented to person, place and time. HEAD:  Black hair with slight graying.  Mustache. Normocephalic, atraumatic, face symmetric, no Cushingoid features. EYES:  Glasses.  Blue eyes.  Pupils equal round and reactive to light and accomodation.  No conjunctivitis or scleral icterus. ENT:  Oropharynx clear without lesion.  Dentures.  Tongue normal. Mucous membranes moist.  RESPIRATORY:  Clear to auscultation without rales, wheezes or rhonchi. CARDIOVASCULAR:  Regular rate and rhythm without murmur, rub or gallop. CHEST WALL:  Pain on palpation over several ribs. ABDOMEN:  Soft, non-tender with active bowel sounds and no hepatosplenomegaly.  No masses. SKIN:  Abdominal bruises s/p Lovenox.  Ecchymosis on upper extremities.  Right cheek erythema. EXTREMITIES:  Thin arms.  No lower extremity edema.   No skin discoloration or tenderness.  No palpable cords. LYMPH NODES: No palpable cervical, supraclavicular, axillary or inguinal adenopathy.  NEUROLOGICAL: Unremarkable. PSYCH:  Anxious.    Appointment on 11/15/2015  Component Date Value Ref Range Status  . WBC 11/15/2015 9.6  3.8 - 10.6 K/uL Final  . RBC 11/15/2015 4.47  4.40 - 5.90 MIL/uL Final  . Hemoglobin 11/15/2015 13.2  13.0 - 18.0 g/dL Final  . HCT 11/15/2015 40.4  40.0 - 52.0 % Final  . MCV 11/15/2015 90.3  80.0 - 100.0 fL Final  . MCH 11/15/2015 29.4  26.0 - 34.0 pg Final  . MCHC 11/15/2015 32.6  32.0 - 36.0 g/dL Final  . RDW 11/15/2015 15.9* 11.5 - 14.5 % Final  . Platelets 11/15/2015 137* 150 - 440 K/uL Final  . Neutrophils Relative % 11/15/2015 78  % Final  . Neutro Abs 11/15/2015 7.5* 1.4 - 6.5 K/uL Final  . Lymphocytes Relative 11/15/2015 14  % Final  . Lymphs Abs 11/15/2015 1.3  1.0 - 3.6 K/uL Final  . Monocytes Relative 11/15/2015 6  % Final  . Monocytes Absolute 11/15/2015 0.6  0.2 - 1.0 K/uL Final  . Eosinophils Relative 11/15/2015 1  % Final  . Eosinophils Absolute 11/15/2015 0.1  0 - 0.7 K/uL Final  . Basophils Relative 11/15/2015 1  % Final  . Basophils Absolute 11/15/2015 0.1  0 - 0.1 K/uL Final  . Sodium 11/15/2015 136  135 - 145 mmol/L Final  . Potassium 11/15/2015 3.9  3.5 - 5.1 mmol/L Final  . Chloride 11/15/2015 94* 101 - 111 mmol/L Final  . CO2 11/15/2015 30  22 - 32 mmol/L Final  . Glucose, Bld 11/15/2015 136* 65 - 99 mg/dL Final  . BUN 11/15/2015 21* 6 - 20 mg/dL Final  . Creatinine, Ser 11/15/2015 1.28* 0.61 - 1.24 mg/dL Final  . Calcium 11/15/2015 9.8  8.9 - 10.3 mg/dL Final  . Total Protein 11/15/2015 7.0  6.5 - 8.1 g/dL Final  . Albumin 11/15/2015 3.8  3.5 - 5.0 g/dL Final  . AST 11/15/2015 25  15 - 41 U/L Final  . ALT 11/15/2015 23  17 - 63 U/L Final  . Alkaline Phosphatase 11/15/2015 142* 38 - 126 U/L Final  . Total Bilirubin 11/15/2015 0.7  0.3 - 1.2 mg/dL Final  . GFR calc non Af Amer  11/15/2015 >60  >60 mL/min Final  . GFR calc Af Amer 11/15/2015 >60  >60 mL/min Final   Comment: (NOTE) The eGFR has been calculated using the CKD EPI equation. This calculation has not been validated in all clinical situations. eGFR's persistently <60 mL/min signify possible Chronic Kidney Disease.   Georgiann Hahn gap 11/15/2015 12  5 - 15 Final    Assessment:  JARAN SAINZ is a 56 y.o. 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    He was diagnosed with stage IIIA squamous cell lung cancer in 07/02/2013.  PET scan on 06/23/2013 revealed a 1.5 cm right upper lobe nodule (SUV 7.4) concerning for primary bronchogenic carcinoma.  There were hypermetabolic ipsilateral hilar and mediastinal lymph node metastasis. There was malignant range FDG uptake associated with bilateral level 2 cervical lymph nodes of uncertain significance.  EBUS assisted biopsy of a right lower paratracheal and right hilar lymph node on 07/02/2013 confirmed non-small cell carcinoma, favoring squamous cell carcinoma.  Clinical stage was T1N2M0.  He received concurrent chemotherapy and radiation.  He received 6,000 cGy from 08/05/2014 - 09/14/2013.  He received weekly carboplatin and Taxol x 6 from 08/02/2013 - 10/25/2013.  Course was complicated by a late pneumothorax secondary to port placement.  He also had a seizure in 03/2014 with subsequent fracture of lumbar vertebrae and rod placement.  PET scan on 04/12/2015 revealed mild soft tissue fullness along the posterior right upper lobe/perihilar region, with associated mild vague hypermetabolism (SUV 2.6).  There were hypermetabolic bilateral adrenal metastases, right (2.7 cm; SUV 10) greater than left (2.1 cm; SUV 7.8).  There was small hypermetabolic upper abdominal nodal metastases (9 mm porta hepatitis (SUV 7.3) and 7 mm portacaval node (SUV 5.3)).  CT guided right adrenal biopsy on 04/25/2015  revealed metastatic adenocarcinoma of lung primary.  TTF-1 was positive.  PD-L1 testing was negative (< 1%).  EGFR, ALK, and RET were negative.  ROS1 revealed no result (unsuccessful).   CEA was 36.5 on 05/25/2015, 34 on 06/15/2015, and 35.3 on 07/06/2015, 48.8 on 07/27/2015, 62.5 on 08/17/2015, 115.6 on 09/07/2015, and 2592 on 11/15/2015.  He has a mild normocytic anemia.  Anemia work-up on 04/05/2015 revealed iron deficiency anemia (ferritin 48, iron saturation 12%, TIBC 310).  Sedimentation rate was 27.  B12 was 255 (low normal) with a folate 13.8 (normal).  MMA was normal (246).  TSH was 8.840 (high) with normal T4 and T3U, and free thyroxine index.   His last colonoscopy was just prior to his diagnosis.  He has never had an EGD.  He denies any melena or hematochezia.  Diet is poor.  He is on oral iron.  Echo on 08/18/2014 revealed an EF of 45%.  Echo at [facility] on 09/19/2015 revealed an EF of 50%.  He received 5 cycles of carboplatin and Alimta  (05/25/2015 - 08/17/2015).  Chest, abdomen, and pelvic CT scan on 08/09/2015 revealed radiation changes in the right upper lobe extending to the perihilar region. There was stable mild soft tissue prominence laterally.  There were stable bilateral adrenal metastases.  There were mildly prominent upper abdominal lymph nodes measuring up to 11 mm.  Bone scan on 08/11/2015 revealed mild multifocal areas of increased  activity about the cervical and lumbar spine and right shoulder, most likely degenerative.   PET scan on 09/06/2015 revealed interval progression of nodal metastasis with new upper mediastinal hypermetabolic adenopathy, bilateral hypermetabolic lymph nodes adjacent to the tails of the parotid glands, and increased number of hypermetabolic periaortic lymph nodes.  There was stable to mildly increased bilateral adrenal metastasis.  He received 1 cycle of nivolumab (09/14/2015).  Course was complicated by colitis. Abdominal and pelvic CT scan on  09/19/2015 revealed pancolitis.  Flexible sigmoidoscopy on 09/21/2015 showed patchy nonspecific elevation sparing the rectum, worse in the sigmoid colon and up to the transverse colon.  Diarrhea and hematochezia stopped abruptly with steroids instituted on 09/19/2015.  He is on a prednisone taper (current dose 20 mg a Hatfield).  He was admitted to [facility] from 09/17/2015 - 09/26/2015 with an NSTEMI.  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 There was 50% mid LAD and mid RCA lesion.  Chest CT angiogram on 09/21/2015 confirmed bilateral pulmonary emboli. Bilateral lower extremity duplex on 09/22/2015 revealed bilateral femoral DVTs.  An IVC filter was placed.  He is on Lovenox 60 mg every 12 hours.  He was admitted to [facility] from 10/13/2015 -  10/25/2015.  He had several loose stools. CT scan revealed no definitive evidence of acute intra-abdominal process.  Flexible sigmoidoscopy on 10/20/2015 revealed colonic mucosa with epithelial injury. CMV was negative.  Stool studies were negative.  His prednisone was increased back to 50 mg a Hatfield.  He was started on Bactrim prophylaxis (MWF).   Chest x-ray revealed a new right new rounded opacity in the right midlung c/w postobstructive pneumonia versus inflammatory process.  He completed a course of azithromycin on 10/19/2015.  Head MRI on 10/22/2015 was negative.   He was admitted to [facility] from 11/04/2015 - 11/05/2015 with generalized abdominal pain and chest pain.  Hemoglobin was 7.6.  He was transfused with 2 units of PRBCs to a hemoglobin of 14.  Chest CT angiogram on 11/04/2015 revealed no evidence of pulmonary embolism.  There was interval progression of mediastinal, right axillary/subpectoral, and left supraclavicular lymphadenopathy.  There was increasing amorphous soft tissue identified in the right hilum, suspicious for disease progression.  There was bilateral adrenal metastases, progressed on the left.   There was retroperitoneal lymphadenopathy which appeared more prominent.  He is on a weekly prednisone taper (Mondays decrease by 10 mg a week).  Current dose is 20 mg a Hatfield.  Symptomatically, he has right sided chest/rib pain worrisome for bone metastasis.   Plan: 1.  Labs today:  CBC with diff, CMP, CEA. 2.  Continue weekly prednisone taper (10 mg then 5 mg then 5 mg QOD).  Call if any increase in diarrhea or blood in stools. 3.  Discuss concerning right sided chest wall pain for bone metastasis.  Discuss bone scan. 4.  Schedule bone scan this week 5.  RTC on 11/23/2015 for MD assessment, labs (CBC with diff, CMP, Mg), review of bone scan, and cycle #1 Taxotere.   Lequita Asal, MD  11/15/2015, 9:32 AM

## 2015-11-16 ENCOUNTER — Telehealth: Payer: Self-pay | Admitting: *Deleted

## 2015-11-16 ENCOUNTER — Other Ambulatory Visit: Payer: Self-pay | Admitting: Hematology and Oncology

## 2015-11-16 DIAGNOSIS — C3490 Malignant neoplasm of unspecified part of unspecified bronchus or lung: Secondary | ICD-10-CM

## 2015-11-16 LAB — CEA: CEA: 2592 ng/mL — ABNORMAL HIGH (ref 0.0–4.7)

## 2015-11-16 MED ORDER — DEXAMETHASONE 4 MG PO TABS: ORAL_TABLET | ORAL | 0 refills | Status: DC

## 2015-11-16 NOTE — Telephone Encounter (Signed)
Called and spoke to wife to let her know that the tumor markers had increased and Dr. Mike Gip suspects that the pain he is having maybe  Bone mets and if it is hopefully the chemo will help with pain but if it does not help then he can have radiation.  All of this is hinged on the bone scan.  The chemo does require that pt start decadron and we will send it to his pharmacy. I did call back and tell mary the directions because she has a few pills left over at home.

## 2015-11-17 ENCOUNTER — Other Ambulatory Visit: Payer: Self-pay | Admitting: Hematology and Oncology

## 2015-11-17 ENCOUNTER — Encounter: Admission: RE | Admit: 2015-11-17 | Payer: BLUE CROSS/BLUE SHIELD | Source: Ambulatory Visit

## 2015-11-17 ENCOUNTER — Encounter
Admission: RE | Admit: 2015-11-17 | Discharge: 2015-11-17 | Disposition: A | Discharge status: Home / Self Care | Payer: BLUE CROSS/BLUE SHIELD | Source: Ambulatory Visit | Attending: Hematology and Oncology | Admitting: Hematology and Oncology

## 2015-11-17 ENCOUNTER — Telehealth: Payer: Self-pay | Admitting: *Deleted

## 2015-11-17 DIAGNOSIS — C3490 Malignant neoplasm of unspecified part of unspecified bronchus or lung: Secondary | ICD-10-CM | POA: Insufficient documentation

## 2015-11-17 DIAGNOSIS — C7970 Secondary malignant neoplasm of unspecified adrenal gland: Secondary | ICD-10-CM | POA: Diagnosis present

## 2015-11-17 DIAGNOSIS — C3411 Malignant neoplasm of upper lobe, right bronchus or lung: Secondary | ICD-10-CM | POA: Diagnosis present

## 2015-11-17 DIAGNOSIS — C7951 Secondary malignant neoplasm of bone: Secondary | ICD-10-CM | POA: Insufficient documentation

## 2015-11-17 MED ORDER — TECHNETIUM TC 99M MEDRONATE IV KIT
25.0000 | PACK | Freq: Once | INTRAVENOUS | Status: AC | PRN
  Administered 2015-11-17: 23.429 via INTRAVENOUS

## 2015-11-17 NOTE — Telephone Encounter (Signed)
Requesting that we call her with bone scan results when available so that she can let him get adjusted to the news before the appt which she will not be able to come to on Thursday

## 2015-11-17 NOTE — Telephone Encounter (Signed)
Called and left message that pt bone scan showed mets to skull bone, lowe back bone, bilateral ribs.  We will start next week when he comes a med xgeva for his bone mets and it is meant to help strengthen his bones to prevent fractures.  She can call me back if she needs to.

## 2015-11-20 ENCOUNTER — Telehealth: Payer: Self-pay | Admitting: *Deleted

## 2015-11-20 DIAGNOSIS — C3490 Malignant neoplasm of unspecified part of unspecified bronchus or lung: Secondary | ICD-10-CM

## 2015-11-20 DIAGNOSIS — C7970 Secondary malignant neoplasm of unspecified adrenal gland: Secondary | ICD-10-CM

## 2015-11-20 DIAGNOSIS — C3411 Malignant neoplasm of upper lobe, right bronchus or lung: Secondary | ICD-10-CM

## 2015-11-20 MED ORDER — FENTANYL 12 MCG/HR TD PT72: 12.5000 ug | MEDICATED_PATCH | TRANSDERMAL | 0 refills | Status: DC

## 2015-11-20 MED ORDER — OXYCODONE HCL 5 MG PO TABS: ORAL_TABLET | ORAL | 0 refills | Status: DC

## 2015-11-20 NOTE — Telephone Encounter (Addendum)
Complaining of back and chest pain, states he has taken about all of his pain med and it is not helping. He was given # 30 tabs of Oxycodone 5 mg on Wed. States he is wheezing and not able to sleep much Asking that something be done, please advise.

## 2015-11-20 NOTE — Telephone Encounter (Signed)
I have called wife [name] and asked her to call e back about Kapena's pain

## 2015-11-20 NOTE — Telephone Encounter (Signed)
  Let's try Fentanyl 12 mcg/hr  Refill oxycodone 5 mg po q 3-4 hours prn pain (dis: #40)  M

## 2015-11-20 NOTE — Telephone Encounter (Signed)
  Let's call and find out:  How much is he taking a day.  What pain level is he (before and after oxycodone).  We will put him on something lonng acting and likely refill his oxycodone.  He should keep a pain diary.  M

## 2015-11-20 NOTE — Telephone Encounter (Signed)
Daneil Dan to come pick up Rx, she said she will be here in 45 minutes

## 2015-11-20 NOTE — Telephone Encounter (Signed)
Per patient, he is taking Oxycodone 5 mg every 3 hours, his pain rating is 10/10 before and after med. He needs a refill of his Oxycodone as well he only has 2 pills left

## 2015-11-21 ENCOUNTER — Telehealth: Payer: Self-pay | Admitting: *Deleted

## 2015-11-21 NOTE — Telephone Encounter (Signed)
Called patient to be sure he got his Fentanyl and Oxycodone prescriptions filled yesterday and to see how he is feeling today.  Patient did not answer phone. LVM as to reason of call.  Advised him to call back if he needs anything.

## 2015-11-22 ENCOUNTER — Encounter: Payer: Self-pay | Admitting: *Deleted

## 2015-11-22 ENCOUNTER — Telehealth: Payer: Self-pay | Admitting: *Deleted

## 2015-11-22 ENCOUNTER — Emergency Department: Payer: BLUE CROSS/BLUE SHIELD

## 2015-11-22 ENCOUNTER — Inpatient Hospital Stay
Admission: EM | Admit: 2015-11-22 | Discharge: 2015-11-26 | DRG: 189 | Disposition: A | Discharge status: Home / Self Care | Payer: BLUE CROSS/BLUE SHIELD | Attending: Internal Medicine | Admitting: Internal Medicine

## 2015-11-22 DIAGNOSIS — I252 Old myocardial infarction: Secondary | ICD-10-CM

## 2015-11-22 DIAGNOSIS — E039 Hypothyroidism, unspecified: Secondary | ICD-10-CM | POA: Diagnosis present

## 2015-11-22 DIAGNOSIS — C3411 Malignant neoplasm of upper lobe, right bronchus or lung: Secondary | ICD-10-CM | POA: Diagnosis present

## 2015-11-22 DIAGNOSIS — C7989 Secondary malignant neoplasm of other specified sites: Secondary | ICD-10-CM | POA: Diagnosis present

## 2015-11-22 DIAGNOSIS — R1013 Epigastric pain: Secondary | ICD-10-CM

## 2015-11-22 DIAGNOSIS — Z7901 Long term (current) use of anticoagulants: Secondary | ICD-10-CM | POA: Diagnosis not present

## 2015-11-22 DIAGNOSIS — R262 Difficulty in walking, not elsewhere classified: Secondary | ICD-10-CM

## 2015-11-22 DIAGNOSIS — C7800 Secondary malignant neoplasm of unspecified lung: Secondary | ICD-10-CM | POA: Diagnosis not present

## 2015-11-22 DIAGNOSIS — Z86718 Personal history of other venous thrombosis and embolism: Secondary | ICD-10-CM

## 2015-11-22 DIAGNOSIS — Z79899 Other long term (current) drug therapy: Secondary | ICD-10-CM

## 2015-11-22 DIAGNOSIS — J441 Chronic obstructive pulmonary disease with (acute) exacerbation: Secondary | ICD-10-CM | POA: Diagnosis present

## 2015-11-22 DIAGNOSIS — E860 Dehydration: Secondary | ICD-10-CM | POA: Diagnosis present

## 2015-11-22 DIAGNOSIS — M5430 Sciatica, unspecified side: Secondary | ICD-10-CM | POA: Diagnosis present

## 2015-11-22 DIAGNOSIS — K5900 Constipation, unspecified: Secondary | ICD-10-CM | POA: Diagnosis present

## 2015-11-22 DIAGNOSIS — R569 Unspecified convulsions: Secondary | ICD-10-CM | POA: Diagnosis present

## 2015-11-22 DIAGNOSIS — I509 Heart failure, unspecified: Secondary | ICD-10-CM

## 2015-11-22 DIAGNOSIS — J189 Pneumonia, unspecified organism: Secondary | ICD-10-CM | POA: Diagnosis present

## 2015-11-22 DIAGNOSIS — H9221 Otorrhagia, right ear: Secondary | ICD-10-CM | POA: Diagnosis present

## 2015-11-22 DIAGNOSIS — G893 Neoplasm related pain (acute) (chronic): Secondary | ICD-10-CM | POA: Diagnosis present

## 2015-11-22 DIAGNOSIS — F329 Major depressive disorder, single episode, unspecified: Secondary | ICD-10-CM | POA: Diagnosis present

## 2015-11-22 DIAGNOSIS — N170 Acute kidney failure with tubular necrosis: Secondary | ICD-10-CM | POA: Diagnosis present

## 2015-11-22 DIAGNOSIS — Z7982 Long term (current) use of aspirin: Secondary | ICD-10-CM

## 2015-11-22 DIAGNOSIS — I110 Hypertensive heart disease with heart failure: Secondary | ICD-10-CM | POA: Diagnosis present

## 2015-11-22 DIAGNOSIS — J690 Pneumonitis due to inhalation of food and vomit: Secondary | ICD-10-CM

## 2015-11-22 DIAGNOSIS — M6281 Muscle weakness (generalized): Secondary | ICD-10-CM

## 2015-11-22 DIAGNOSIS — J9601 Acute respiratory failure with hypoxia: Principal | ICD-10-CM | POA: Diagnosis present

## 2015-11-22 DIAGNOSIS — C7951 Secondary malignant neoplasm of bone: Secondary | ICD-10-CM | POA: Diagnosis present

## 2015-11-22 DIAGNOSIS — R0902 Hypoxemia: Secondary | ICD-10-CM

## 2015-11-22 DIAGNOSIS — Z87891 Personal history of nicotine dependence: Secondary | ICD-10-CM

## 2015-11-22 DIAGNOSIS — C7972 Secondary malignant neoplasm of left adrenal gland: Secondary | ICD-10-CM | POA: Diagnosis not present

## 2015-11-22 DIAGNOSIS — E872 Acidosis: Secondary | ICD-10-CM | POA: Diagnosis present

## 2015-11-22 DIAGNOSIS — C7971 Secondary malignant neoplasm of right adrenal gland: Secondary | ICD-10-CM | POA: Diagnosis present

## 2015-11-22 DIAGNOSIS — R0602 Shortness of breath: Secondary | ICD-10-CM | POA: Diagnosis not present

## 2015-11-22 DIAGNOSIS — C7931 Secondary malignant neoplasm of brain: Secondary | ICD-10-CM | POA: Diagnosis present

## 2015-11-22 DIAGNOSIS — R0789 Other chest pain: Secondary | ICD-10-CM

## 2015-11-22 DIAGNOSIS — K529 Noninfective gastroenteritis and colitis, unspecified: Secondary | ICD-10-CM | POA: Diagnosis present

## 2015-11-22 DIAGNOSIS — J440 Chronic obstructive pulmonary disease with acute lower respiratory infection: Secondary | ICD-10-CM | POA: Diagnosis present

## 2015-11-22 DIAGNOSIS — R52 Pain, unspecified: Secondary | ICD-10-CM

## 2015-11-22 LAB — BASIC METABOLIC PANEL
Anion gap: 16 — ABNORMAL HIGH (ref 5–15)
BUN: 22 mg/dL — AB (ref 6–20)
CHLORIDE: 91 mmol/L — AB (ref 101–111)
CO2: 27 mmol/L (ref 22–32)
CREATININE: 1.51 mg/dL — AB (ref 0.61–1.24)
Calcium: 10.1 mg/dL (ref 8.9–10.3)
GFR calc Af Amer: 58 mL/min — ABNORMAL LOW (ref >60)
GFR calc non Af Amer: 50 mL/min — ABNORMAL LOW (ref >60)
Glucose, Bld: 84 mg/dL (ref 65–99)
POTASSIUM: 4.3 mmol/L (ref 3.5–5.1)
Sodium: 134 mmol/L — ABNORMAL LOW (ref 135–145)

## 2015-11-22 LAB — CBC WITH DIFFERENTIAL/PLATELET
BASOS PCT: 0 %
BLASTS: 0 %
Band Neutrophils: 0 %
Basophils Absolute: 0 10*3/uL (ref 0–0.1)
Eosinophils Absolute: 0.2 10*3/uL (ref 0–0.7)
Eosinophils Relative: 2 %
HEMATOCRIT: 37.4 % — AB (ref 40.0–52.0)
HEMOGLOBIN: 12.2 g/dL — AB (ref 13.0–18.0)
LYMPHS PCT: 16 %
Lymphs Abs: 1.3 10*3/uL (ref 1.0–3.6)
MCH: 29.3 pg (ref 26.0–34.0)
MCHC: 32.7 g/dL (ref 32.0–36.0)
MCV: 89.5 fL (ref 80.0–100.0)
MONO ABS: 0.9 10*3/uL (ref 0.2–1.0)
MYELOCYTES: 0 %
Metamyelocytes Relative: 0 %
Monocytes Relative: 11 %
NEUTROS PCT: 71 %
NRBC: 1 /100{WBCs} — AB
Neutro Abs: 5.5 10*3/uL (ref 1.4–6.5)
OTHER: 0 %
PROMYELOCYTES ABS: 0 %
Platelets: 119 10*3/uL — ABNORMAL LOW (ref 150–440)
RBC: 4.18 MIL/uL — AB (ref 4.40–5.90)
RDW: 16.7 % — ABNORMAL HIGH (ref 11.5–14.5)
WBC: 7.9 10*3/uL (ref 3.8–10.6)

## 2015-11-22 LAB — LACTIC ACID, PLASMA
LACTIC ACID, VENOUS: 2.1 mmol/L — AB (ref 0.5–1.9)
LACTIC ACID, VENOUS: 1.9 mmol/L (ref 0.5–1.9)

## 2015-11-22 LAB — HEPATIC FUNCTION PANEL
ALT: 19 U/L (ref 17–63)
AST: 38 U/L (ref 15–41)
Albumin: 3.6 g/dL (ref 3.5–5.0)
Alkaline Phosphatase: 130 U/L — ABNORMAL HIGH (ref 38–126)
BILIRUBIN TOTAL: 1.4 mg/dL — AB (ref 0.3–1.2)
Total Protein: 6.9 g/dL (ref 6.5–8.1)

## 2015-11-22 LAB — TYPE AND SCREEN
ABO/RH(D): O POS
ANTIBODY SCREEN: NEGATIVE

## 2015-11-22 LAB — PROTIME-INR
INR: 1.31
Prothrombin Time: 16.4 seconds — ABNORMAL HIGH (ref 11.4–15.2)

## 2015-11-22 LAB — APTT: aPTT: 60 seconds — ABNORMAL HIGH (ref 24–36)

## 2015-11-22 MED ORDER — SODIUM CHLORIDE 0.9 % IV BOLUS (SEPSIS)
1000.0000 mL | Freq: Once | INTRAVENOUS | Status: AC
  Administered 2015-11-22: 1000 mL via INTRAVENOUS

## 2015-11-22 MED ORDER — OXYCODONE HCL 5 MG PO TABS
5.0000 mg | ORAL_TABLET | ORAL | Status: DC | PRN
  Administered 2015-11-22 – 2015-11-26 (×6): 5 mg via ORAL
  Filled 2015-11-22 (×7): qty 1

## 2015-11-22 MED ORDER — ONDANSETRON HCL 4 MG/2ML IJ SOLN
INTRAMUSCULAR | Status: AC
  Administered 2015-11-22: 4 mg via INTRAVENOUS
  Filled 2015-11-22: qty 2

## 2015-11-22 MED ORDER — ATORVASTATIN CALCIUM 20 MG PO TABS
80.0000 mg | ORAL_TABLET | Freq: Every evening | ORAL | Status: DC
  Administered 2015-11-22 – 2015-11-25 (×4): 80 mg via ORAL
  Filled 2015-11-22 (×4): qty 4

## 2015-11-22 MED ORDER — ASPIRIN EC 81 MG PO TBEC
81.0000 mg | DELAYED_RELEASE_TABLET | Freq: Every day | ORAL | Status: DC
  Administered 2015-11-23 – 2015-11-26 (×4): 81 mg via ORAL
  Filled 2015-11-22 (×4): qty 1

## 2015-11-22 MED ORDER — DEXTROSE 5 % IV SOLN
500.0000 mg | Freq: Once | INTRAVENOUS | Status: AC
  Administered 2015-11-22: 500 mg via INTRAVENOUS
  Filled 2015-11-22: qty 500

## 2015-11-22 MED ORDER — CEFTRIAXONE SODIUM-DEXTROSE 1-3.74 GM-% IV SOLR
1.0000 g | INTRAVENOUS | Status: DC
  Administered 2015-11-23 – 2015-11-25 (×3): 1 g via INTRAVENOUS
  Filled 2015-11-22 (×4): qty 50

## 2015-11-22 MED ORDER — CEFTRIAXONE SODIUM 1 G IJ SOLR: 1.0000 g | Freq: Once | INTRAMUSCULAR | Status: DC

## 2015-11-22 MED ORDER — SODIUM CHLORIDE 0.9 % IV SOLN
INTRAVENOUS | Status: DC
  Administered 2015-11-22 – 2015-11-24 (×3): via INTRAVENOUS

## 2015-11-22 MED ORDER — DEXTROSE 5 % IV SOLN
500.0000 mg | INTRAVENOUS | Status: DC
  Administered 2015-11-23: 500 mg via INTRAVENOUS
  Filled 2015-11-22 (×2): qty 500

## 2015-11-22 MED ORDER — CALCIUM CARBONATE-VITAMIN D 500-200 MG-UNIT PO TABS
1.0000 | ORAL_TABLET | Freq: Two times a day (BID) | ORAL | Status: DC
Start: 2015-11-22 — End: 2015-11-26
  Administered 2015-11-22 – 2015-11-26 (×8): 1 via ORAL
  Filled 2015-11-22 (×8): qty 1

## 2015-11-22 MED ORDER — ALBUTEROL SULFATE (2.5 MG/3ML) 0.083% IN NEBU: 2.5000 mg | INHALATION_SOLUTION | Freq: Four times a day (QID) | RESPIRATORY_TRACT | Status: DC | PRN

## 2015-11-22 MED ORDER — QUETIAPINE FUMARATE 25 MG PO TABS
50.0000 mg | ORAL_TABLET | Freq: Every day | ORAL | Status: DC
  Administered 2015-11-22 – 2015-11-26 (×5): 50 mg via ORAL
  Filled 2015-11-22 (×6): qty 2

## 2015-11-22 MED ORDER — HYDROMORPHONE HCL 1 MG/ML IJ SOLN
0.5000 mg | Freq: Once | INTRAMUSCULAR | Status: AC
  Administered 2015-11-22: 0.5 mg via INTRAVENOUS
  Filled 2015-11-22: qty 1

## 2015-11-22 MED ORDER — ONDANSETRON HCL 4 MG/2ML IJ SOLN
4.0000 mg | Freq: Once | INTRAMUSCULAR | Status: AC
  Administered 2015-11-22: 4 mg via INTRAVENOUS

## 2015-11-22 MED ORDER — CEFTRIAXONE SODIUM-DEXTROSE 1-3.74 GM-% IV SOLR
1.0000 g | Freq: Once | INTRAVENOUS | Status: AC
  Administered 2015-11-22: 1 g via INTRAVENOUS
  Filled 2015-11-22: qty 50

## 2015-11-22 MED ORDER — METOPROLOL TARTRATE 5 MG/5ML IV SOLN
5.0000 mg | Freq: Once | INTRAVENOUS | Status: AC
  Administered 2015-11-22: 5 mg via INTRAVENOUS
  Filled 2015-11-22: qty 5

## 2015-11-22 MED ORDER — MORPHINE SULFATE (PF) 2 MG/ML IV SOLN
INTRAVENOUS | Status: AC
  Administered 2015-11-22: 2 mg via INTRAVENOUS
  Filled 2015-11-22: qty 1

## 2015-11-22 MED ORDER — METOPROLOL SUCCINATE ER 25 MG PO TB24
25.0000 mg | ORAL_TABLET | Freq: Two times a day (BID) | ORAL | Status: DC
  Administered 2015-11-22 – 2015-11-26 (×8): 25 mg via ORAL
  Filled 2015-11-22 (×8): qty 1

## 2015-11-22 MED ORDER — ONDANSETRON HCL 4 MG/2ML IJ SOLN: 4.0000 mg | Freq: Four times a day (QID) | INTRAMUSCULAR | Status: DC | PRN

## 2015-11-22 MED ORDER — ONDANSETRON HCL 4 MG PO TABS: 4.0000 mg | ORAL_TABLET | Freq: Four times a day (QID) | ORAL | Status: DC | PRN

## 2015-11-22 MED ORDER — GABAPENTIN 100 MG PO CAPS
100.0000 mg | ORAL_CAPSULE | Freq: Three times a day (TID) | ORAL | Status: DC
  Administered 2015-11-22 – 2015-11-26 (×13): 100 mg via ORAL
  Filled 2015-11-22 (×13): qty 1

## 2015-11-22 MED ORDER — HYDROXYZINE HCL 10 MG PO TABS
10.0000 mg | ORAL_TABLET | Freq: Three times a day (TID) | ORAL | Status: DC | PRN
  Administered 2015-11-23 – 2015-11-25 (×3): 10 mg via ORAL
  Filled 2015-11-22 (×4): qty 1

## 2015-11-22 MED ORDER — LORAZEPAM 0.5 MG PO TABS
0.5000 mg | ORAL_TABLET | Freq: Three times a day (TID) | ORAL | Status: DC | PRN
  Administered 2015-11-25 – 2015-11-26 (×3): 0.5 mg via ORAL
  Filled 2015-11-22 (×3): qty 1

## 2015-11-22 MED ORDER — VALACYCLOVIR HCL 500 MG PO TABS: 500.0000 mg | ORAL_TABLET | Freq: Every day | ORAL | Status: DC

## 2015-11-22 MED ORDER — ACETAMINOPHEN 325 MG PO TABS: 650.0000 mg | ORAL_TABLET | Freq: Four times a day (QID) | ORAL | Status: DC | PRN

## 2015-11-22 MED ORDER — ENOXAPARIN SODIUM 60 MG/0.6ML ~~LOC~~ SOLN
60.0000 mg | Freq: Two times a day (BID) | SUBCUTANEOUS | Status: DC
  Administered 2015-11-22 – 2015-11-26 (×8): 60 mg via SUBCUTANEOUS
  Filled 2015-11-22 (×8): qty 0.6

## 2015-11-22 MED ORDER — ACETAMINOPHEN 325 MG PO TABS
650.0000 mg | ORAL_TABLET | Freq: Four times a day (QID) | ORAL | Status: DC | PRN
  Administered 2015-11-22: 650 mg via ORAL
  Filled 2015-11-22: qty 2

## 2015-11-22 MED ORDER — HEPARIN SODIUM (PORCINE) 5000 UNIT/ML IJ SOLN: 5000.0000 [IU] | Freq: Three times a day (TID) | INTRAMUSCULAR | Status: DC

## 2015-11-22 MED ORDER — METOCLOPRAMIDE HCL 10 MG PO TABS: 10.0000 mg | ORAL_TABLET | Freq: Four times a day (QID) | ORAL | Status: DC | PRN

## 2015-11-22 MED ORDER — DULOXETINE HCL 60 MG PO CPEP
60.0000 mg | ORAL_CAPSULE | Freq: Every day | ORAL | Status: DC
  Administered 2015-11-22 – 2015-11-26 (×5): 60 mg via ORAL
  Filled 2015-11-22 (×5): qty 1

## 2015-11-22 MED ORDER — DOCUSATE SODIUM 100 MG PO CAPS
100.0000 mg | ORAL_CAPSULE | Freq: Two times a day (BID) | ORAL | Status: DC
  Administered 2015-11-22 – 2015-11-25 (×7): 100 mg via ORAL
  Filled 2015-11-22 (×8): qty 1

## 2015-11-22 MED ORDER — HYDROCODONE-ACETAMINOPHEN 5-325 MG PO TABS
1.0000 | ORAL_TABLET | ORAL | Status: DC | PRN
  Administered 2015-11-22 – 2015-11-24 (×6): 2 via ORAL
  Administered 2015-11-24: 1 via ORAL
  Administered 2015-11-24 – 2015-11-25 (×3): 2 via ORAL
  Administered 2015-11-25: 1 via ORAL
  Administered 2015-11-25 – 2015-11-26 (×5): 2 via ORAL
  Filled 2015-11-22 (×10): qty 2
  Filled 2015-11-22: qty 1
  Filled 2015-11-22 (×4): qty 2
  Filled 2015-11-22: qty 1

## 2015-11-22 MED ORDER — FENTANYL 12 MCG/HR TD PT72
12.5000 ug | MEDICATED_PATCH | TRANSDERMAL | Status: DC
  Administered 2015-11-22 – 2015-11-25 (×2): 12.5 ug via TRANSDERMAL
  Filled 2015-11-22 (×2): qty 1

## 2015-11-22 MED ORDER — DEXTROSE 5 % IV SOLN: 1.0000 g | INTRAVENOUS | Status: DC

## 2015-11-22 MED ORDER — MORPHINE SULFATE (PF) 2 MG/ML IV SOLN
2.0000 mg | Freq: Once | INTRAVENOUS | Status: AC
  Administered 2015-11-22: 2 mg via INTRAVENOUS

## 2015-11-22 MED ORDER — BISACODYL 5 MG PO TBEC
5.0000 mg | DELAYED_RELEASE_TABLET | Freq: Every day | ORAL | Status: DC | PRN
  Administered 2015-11-25: 5 mg via ORAL
  Filled 2015-11-22: qty 1

## 2015-11-22 MED ORDER — PANTOPRAZOLE SODIUM 40 MG PO TBEC
40.0000 mg | DELAYED_RELEASE_TABLET | Freq: Every day | ORAL | Status: DC
  Administered 2015-11-23 – 2015-11-26 (×4): 40 mg via ORAL
  Filled 2015-11-22 (×4): qty 1

## 2015-11-22 MED ORDER — LEVOTHYROXINE SODIUM 88 MCG PO TABS
88.0000 ug | ORAL_TABLET | Freq: Every day | ORAL | Status: DC
  Administered 2015-11-23 – 2015-11-26 (×4): 88 ug via ORAL
  Filled 2015-11-22 (×4): qty 1

## 2015-11-22 MED ORDER — ACETAMINOPHEN 650 MG RE SUPP: 650.0000 mg | Freq: Four times a day (QID) | RECTAL | Status: DC | PRN

## 2015-11-22 NOTE — ED Triage Notes (Signed)
Patient c/o blood coming from right ear that began today. Patient c/o increased bruising in legs. Patient is on Lovenox SQ. Patient has a recurrence of cancer and is starting chemo this week.

## 2015-11-22 NOTE — ED Provider Notes (Signed)
[facility] Emergency Department Provider Note ____________________________________________   I have reviewed the triage vital signs and the triage nursing note.  HISTORY  Chief Complaint Emesis; bleeding from right ear; and Shortness of Breath   Historian Patient  HPI Darryl Hatfield is a 55 y.o. male with metastatic lung ca, to brain, bone and abdomen, presenting today with worsening chest pain and abdominal pain and shortness of breath and cough since Sunday. Denies fever. He states he had some vomiting and diarrhea on Sunday but that seems to have been a little bit better. He is able to take by mouth now pain is moderate to severe and mostly located in the mid abdomen. He just started on fentanyl patch on Monday states that his pain is not much improved yet.Despite having a diagnosis of lung cancer, he has never been hypoxic or needed O2 at home.  Patient states that he has a history of prior DVT, as well as IVC filter and he takes Lovenox shots twice daily.  He had a small scratch on the inside of his ear was bleeding, now stopped.    Past Medical History:  Diagnosis Date  . CHF (congestive heart failure) ([place])   . COPD (chronic obstructive pulmonary disease) ([place])   . Hypothyroidism   . Lung cancer ([place]) 2015   RIght   . Seizures [facility])     Patient Active Problem List   Diagnosis Date Noted  . Bone metastasis ([place]) 11/17/2015  . Symptomatic anemia 11/04/2015  . Chest pain 11/04/2015  . Tachycardia 11/04/2015  . Pulmonary embolism without acute cor pulmonale ([place]) 10/07/2015  . Acute deep vein thrombosis (DVT) of femoral vein of both lower extremities ([place]) 10/07/2015  . Colitis 10/07/2015  . Dehydration 09/28/2015  . Epigastric discomfort 09/16/2015  . STEMI (ST elevation myocardial infarction) ([place]) 09/16/2015  . Pruritus 07/27/2015  . Constipation 06/05/2015  . Anxiety 05/12/2015  . Weight loss 05/12/2015  . 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 Adenocarcinoma of lung, stage 4 ([place]) 04/25/2015  . Iron deficiency anemia 04/05/2015  . Episodes of formed visual hallucinations 12/26/2014  . Dementia with behavioral disturbance 12/26/2014  . Delirium 12/25/2014  . Cancer of upper lobe of right lung ([place]) 07/02/2013  . SCIATICA 03/01/2010    Past Surgical History:  Procedure Laterality Date  . BACK SURGERY    . COLONOSCOPY  10/2015  . PORTACATH PLACEMENT Right     Prior to Admission medications   Medication Sig Start Date End Date Taking? Authorizing Provider  acetaminophen (TYLENOL) 325 MG tablet Take 650 mg by mouth every 6 (six) hours as needed for mild pain.  04/21/14   Historical Provider, MD  albuterol (PROVENTIL) (2.5 MG/3ML) 0.083% nebulizer solution Take 3 mLs (2.5 mg total) by nebulization every 6 (six) hours as needed for wheezing or shortness of breath. Patient taking differently: Take 2.5 mg by nebulization 2 (two) times daily.  02/03/15   Dmitriy Berenzon, MD  aspirin 81 MG EC tablet Take 81 mg by mouth daily.  09/25/15   Historical Provider, MD  atorvastatin (LIPITOR) 80 MG tablet Take 80 mg by mouth daily.  09/25/15 10/25/15  Historical Provider, MD  Calcium Carbonate-Vitamin D 600-400 MG-UNIT tablet Take 1 tablet by mouth 2 (two) times daily.  09/26/15 09/25/16  Historical Provider, MD  dexamethasone (DECADRON) 4 MG tablet Take 8 mg (2 pills) twice a day on the day before Taxotere and on the day after Taxotere. 11/16/15   Melissa  Elmarie Mainland, MD  DULoxetine (CYMBALTA) 30 MG capsule Take 60 mg by mouth daily.     Historical Provider, MD  enoxaparin (LOVENOX) 60 MG/0.6ML injection Inject 0.6 mLs (60 mg total) into the skin every 12 (twelve) hours. 09/28/15 11/04/15  Lequita Asal, MD  enoxaparin (LOVENOX) 60 MG/0.6ML injection Inject into the skin. 11/10/15   Historical Provider, MD  fentaNYL (DURAGESIC - DOSED MCG/HR) 12 MCG/HR Place 1 patch (12.5 mcg total) onto the skin every 3 (three) days. 11/20/15    Lequita Asal, MD  folic acid (FOLVITE) 1 MG tablet TAKE 1 TABLET BY MOUTH DAILY START 5-7 DAYS BEFORE ALIMTA CHEMO, CONTINUE UNTIL 21 DAYS AFTER ALIMTA 09/13/15   Lequita Asal, MD  gabapentin (NEURONTIN) 100 MG capsule Take 1 capsule (100 mg total) by mouth 3 (three) times daily. 03/15/15   Dmitriy Berenzon, MD  hydrOXYzine (ATARAX/VISTARIL) 10 MG tablet Take 1 tablet (10 mg total) by mouth 3 (three) times daily as needed for itching. 10/05/15   Lequita Asal, MD  levothyroxine (SYNTHROID, LEVOTHROID) 88 MCG tablet Take 1 tablet (88 mcg total) by mouth daily before breakfast. Patient taking differently: Take 44 mcg by mouth daily before breakfast.  06/16/15   Lequita Asal, MD  LORazepam (ATIVAN) 0.5 MG tablet Take 1 tablet (0.5 mg total) by mouth every 8 (eight) hours as needed for anxiety. 06/16/15   Lequita Asal, MD  metoCLOPramide (REGLAN) 10 MG tablet Take 1 tablet (10 mg total) by mouth every 6 (six) hours as needed. 09/29/15   Carrie Mew, MD  metoprolol succinate (TOPROL-XL) 25 MG 24 hr tablet Take 25 mg by mouth 2 (two) times daily.  09/26/15   Historical Provider, MD  nitroGLYCERIN (NITROSTAT) 0.4 MG SL tablet Place 0.4 mg under the tongue every 5 (five) minutes as needed.  09/25/15   Historical Provider, MD  ondansetron (ZOFRAN) 8 MG tablet Take 1 tablet (8 mg total) by mouth every 8 (eight) hours as needed for nausea or vomiting. 09/07/15   Lequita Asal, MD  oxyCODONE (OXY IR/ROXICODONE) 5 MG immediate release tablet 1 tablet every 3 - 4 hours as needed for pain 11/20/15   Lequita Asal, MD  pantoprazole (PROTONIX) 40 MG tablet Take 40 mg by mouth daily.  09/25/15 10/25/15  Historical Provider, MD  predniSONE (DELTASONE) 10 MG tablet 10/5-10/8 - take 4 tabs; 10/9-10/15 - take 3 tabs; 10/16-10/22 - take 2 tabs; 10/23-10/29 - take 1 tabs 10/25/15   Historical Provider, MD  QUEtiapine (SEROQUEL) 50 MG tablet Take 50 mg by mouth daily.  07/18/15 01/14/16  Historical  Provider, MD  traMADol-acetaminophen (ULTRACET) 37.5-325 MG tablet Take 1 tablet by mouth every 6 (six) hours as needed.    Historical Provider, MD  valACYclovir (VALTREX) 500 MG tablet Take 500 mg by mouth daily.  10/11/15   Historical Provider, MD    Allergies  Allergen Reactions  . No Known Allergies     Family History  Problem Relation Age of Onset  . Hypertension Other   . Heart failure Other   . Cancer Father     Social History Social History  Substance Use Topics  . Smoking status: Former Smoker    Packs/day: 1.00    Years: 40.00    Quit date: 06/20/2013  . Smokeless tobacco: Never Used  . Alcohol use No    Review of Systems  Constitutional: Negative for fever. Eyes: Negative for visual changes. ENT: Negative for sore throat. Cardiovascular: Negative for  chest pain. Respiratory: Positive for shortness of breath. Gastrointestinal: Positive as per history of present illness. Abdominal pain is in the mid and upper abdomen, no focal or right lower quadrant tenderness or lower abdominal tenderness to palpation. Genitourinary: Negative for dysuria. Musculoskeletal: Negative for back pain. Skin: Negative for rash. Neurological: Negative for headache. 10 point Review of Systems otherwise negative ____________________________________________   PHYSICAL EXAM:  VITAL SIGNS: ED Triage Vitals  Enc Vitals Group     BP 11/22/15 1236 (!) 144/92     Pulse Rate 11/22/15 1236 (!) 122     Resp 11/22/15 1236 (!) 22     Temp 11/22/15 1236 98.2 F (36.8 C)     Temp Source 11/22/15 1236 Oral     SpO2 11/22/15 1236 (!) 88 %     Weight 11/22/15 1236 135 lb (61.2 kg)     Height 11/22/15 1236 '5\' 8"'$  (1.727 m)     Head Circumference --      Peak Flow --      Pain Score 11/22/15 1237 10     Pain Loc --      Pain Edu? --      Excl. in [place]? --      Constitutional: Alert and oriented. Well appearing and in no distress. HEENT   Head: Normocephalic and atraumatic.      Eyes:  Conjunctivae are normal. PERRL. Normal extraocular movements.      Ears:         Nose: No congestion/rhinnorhea.   Mouth/Throat: Mucous membranes are moist.   Neck: No stridor. Cardiovascular/Chest: Tachycardic, regular rhythm.  No murmurs, rubs, or gallops. Respiratory: Normal respiratory effort without tachypnea nor retractions. Some decreased breath sounds throughout especially at the bases. Mild rhonchi especially with coughing which is intermittent. Gastrointestinal: Soft. No distention, no guarding, no rebound. Nontender.    Genitourinary/rectal:Deferred Musculoskeletal: Nontender with normal range of motion in all extremities. No joint effusions.  No lower extremity tenderness.  No edema. Neurologic:  Normal speech and language. No gross or focal neurologic deficits are appreciated. Skin:  Skin is warm, dry and intact. No rash noted. Psychiatric: Mood and affect are normal. Speech and behavior are normal. Patient exhibits appropriate insight and judgment.   ____________________________________________  LABS (pertinent positives/negatives)  Labs Reviewed  CBC WITH DIFFERENTIAL/PLATELET - Abnormal; Notable for the following:       Result Value   RBC 4.18 (*)    Hemoglobin 12.2 (*)    HCT 37.4 (*)    RDW 16.7 (*)    Platelets 119 (*)    All other components within normal limits  BASIC METABOLIC PANEL - Abnormal; Notable for the following:    Sodium 134 (*)    Chloride 91 (*)    BUN 22 (*)    Creatinine, Ser 1.51 (*)    GFR calc non Af Amer 50 (*)    GFR calc Af Amer 58 (*)    Anion gap 16 (*)    All other components within normal limits  APTT - Abnormal; Notable for the following:    aPTT 60 (*)    All other components within normal limits  PROTIME-INR - Abnormal; Notable for the following:    Prothrombin Time 16.4 (*)    All other components within normal limits  HEPATIC FUNCTION PANEL - Abnormal; Notable for the following:    Alkaline Phosphatase 130 (*)     Total Bilirubin 1.4 (*)    Bilirubin, Direct <0.1 (*)  All other components within normal limits  CULTURE, BLOOD (ROUTINE X 2)  CULTURE, BLOOD (ROUTINE X 2)  LACTIC ACID, PLASMA  LACTIC ACID, PLASMA  TYPE AND SCREEN    ____________________________________________    EKG I, Lisa Roca, MD, the attending physician have personally viewed and interpreted all ECGs.  117 bpm. Sinus tachycardia. Narrow QRS. Normal axis. Normal ST and T-wave ____________________________________________  RADIOLOGY All Xrays were viewed by me. Imaging interpreted by Radiologist.  Chest and abdomen xray: Pending __________________________________________  PROCEDURES  Procedure(s) performed: None  Critical Care performed: None  ____________________________________________   ED COURSE / ASSESSMENT AND PLAN  Pertinent labs & imaging results that were available during my care of the patient were reviewed by me and considered in my medical decision making (see chart for details).   Mr. Cordova has metastatic lung cancer, and when I review his prior CT of the chest abdomen and pelvis from 11/04/15 and there is no PE and diffuse worsening metastatic disease. Today he is complaining of worsening pain which I suspect is probably due to his metastatic disease, but many cases I'm going to start treating him symptomatically. He is also coughing and has hypoxia which is new for him, as I am checking an x-ray.  He is tachycardic, no hypotension.   Unclear source of tachycardia, but in possibility of sepsis, will place on sepsis pathway.  CXR with right sided lung CA findings similar to prior, no new infiltrate.    CONSULTATIONS:  Hospitalist for admission   Patient / Family / Caregiver informed of clinical course, medical decision-making process, and agree with plan.   ___________________________________________   FINAL CLINICAL IMPRESSION(S) / ED DIAGNOSES   Final diagnoses:  Hypoxia   Shortness of breath  Atypical chest pain  Epigastric pain              Note: This dictation was prepared with Dragon dictation. Any transcriptional errors that result from this process are unintentional    Lisa Roca, MD 11/22/15 1559

## 2015-11-22 NOTE — ED Notes (Signed)
Patient transported to X-ray 

## 2015-11-22 NOTE — Telephone Encounter (Signed)
Darryl Hatfield is in ED to be admitted.  He is hypoxic (saturation in 80's when patient arrived).  Placed on O2 and saturation went up to 97%.  When he was taken off the O2 he dropped again.  He is complaining of cough and chest pain.  Also tachycardic.  MD notified.  Called patient's wife Darryl Hatfield and asked her to call Dr. Mike Gip per VO.

## 2015-11-22 NOTE — H&P (Signed)
[facility] [place] NAME: Darryl Hatfield    MR#:  [id number]  DATE OF BIRTH:  Nov 18, 1960  DATE OF ADMISSION:  11/22/2015  PRIMARY CARE PHYSICIAN: Marinda Elk, MD   REQUESTING/REFERRING PHYSICIAN: Dr. Lisa Roca  CHIEF COMPLAINT: Shortness of breath    Chief Complaint  Patient presents with  . Emesis  . bleeding from right ear  . Shortness of Breath    HISTORY OF PRESENT ILLNESS:  Darryl Hatfield  is a 55 y.o. male with a known history ofLung cancer follow up with Dr. Nolon Stalls, came in because of shortness of breath, cough since Sunday. Hypoxic with  sats h 80% when he arrived. On 2 L. It went up to 97%, and he was taken off oxygen his saturation dropped again. Patient chest x-ray did not show any pneumonia. 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 Patient had a CT  Angio on October 14 at that time no PE, interval progression of the mediastinal right axillary, left supraclavicular lymphadenopathy, bilateral adrenal metastases,. Patient talked on Lovenox,  alsohad IVC filter. Admitted to [facility] from September 22 to October 4 for diarrhea, and that time he has been prednisone, Bactrim, previously he had bilateral PE confirmed by CT on August 31, bilateral femoral DVT, patient has been taking Lovenox since August 31 60 mg every  Q12,hr. never been on oxygen. He also complained of blood on  Right ear. Patient also  had a PET scan with Dr. Mike Gip on August 16 showed progression of the disease  PAST MEDICAL HISTORY:   Past Medical History:  Diagnosis Date  . CHF (congestive heart failure) ([place])   . COPD (chronic obstructive pulmonary disease) ([place])   . Hypothyroidism   . Lung cancer ([place]) 2015   RIght   . Seizures ([place])     PAST SURGICAL HISTOIRY:   Past Surgical History:  Procedure Laterality Date  . BACK SURGERY     . COLONOSCOPY  10/2015  . PORTACATH PLACEMENT Right     SOCIAL HISTORY:   Social History  Substance Use Topics  . Smoking status: Former Smoker    Packs/day: 1.00    Years: 40.00    Quit date: 06/20/2013  . Smokeless tobacco: Never Used  . Alcohol use No    FAMILY HISTORY:   Family History  Problem Relation Age of Onset  . Hypertension Other   . Heart failure Other   . Cancer Father     DRUG ALLERGIES:   Allergies  Allergen Reactions  . No Known Allergies     REVIEW OF SYSTEMS:  CONSTITUTIONAL: No fever, fatigue or weakness.  EYES: No blurred or double vision.  EARS, NOSE, AND THROAT: No tinnitus or ear pain.  RESPIRATORY: Cough, shortness of breath.  CARDIOVASCULAR: No chest pain, orthopnea, edema.  GASTROINTESTINAL: No nausea, vomiting, diarrhea or abdominal pain.  GENITOURINARY: No dysuria, hematuria.  ENDOCRINE: No polyuria, nocturia,  HEMATOLOGY: No anemia, easy bruising or bleeding SKIN: No rash or lesion. MUSCULOSKELETAL: No joint pain or arthritis.   NEUROLOGIC: No tingling, numbness, weakness.  PSYCHIATRYAnxiety, depression.  MEDICATIONS AT HOME:   Prior to Admission medications   Medication Sig Start Date End Date Taking? Authorizing Provider  acetaminophen (TYLENOL) 325 MG tablet Take 650 mg by mouth every 6 (six) hours as needed for mild pain.  04/21/14  Yes Historical Provider, MD  albuterol (PROVENTIL) (  2.5 MG/3ML) 0.083% nebulizer solution Take 3 mLs (2.5 mg total) by nebulization every 6 (six) hours as needed for wheezing or shortness of breath. Patient taking differently: Take 2.5 mg by nebulization 2 (two) times daily.  02/03/15  Yes Dmitriy Berenzon, MD  aspirin 81 MG EC tablet Take 81 mg by mouth daily.  09/25/15  Yes Historical Provider, MD  atorvastatin (LIPITOR) 80 MG tablet Take 80 mg by mouth daily.  09/25/15 11/22/15 Yes Historical Provider, MD  Calcium Carbonate-Vitamin D 600-400 MG-UNIT tablet Take 1 tablet by mouth 2 (two) times daily.   09/26/15 09/25/16 Yes Historical Provider, MD  dexamethasone (DECADRON) 4 MG tablet Take 8 mg (2 pills) twice a day on the day before Taxotere and on the day after Taxotere. 11/16/15  Yes Lequita Asal, MD  DULoxetine (CYMBALTA) 30 MG capsule Take 60 mg by mouth daily.    Yes Historical Provider, MD  enoxaparin (LOVENOX) 60 MG/0.6ML injection Inject 0.6 mLs (60 mg total) into the skin every 12 (twelve) hours. 09/28/15 11/22/15 Yes Lequita Asal, MD  fentaNYL (DURAGESIC - DOSED MCG/HR) 12 MCG/HR Place 1 patch (12.5 mcg total) onto the skin every 3 (three) days. 11/20/15  Yes Lequita Asal, MD  folic acid (FOLVITE) 1 MG tablet TAKE 1 TABLET BY MOUTH DAILY START 5-7 DAYS BEFORE ALIMTA CHEMO, CONTINUE UNTIL 21 DAYS AFTER ALIMTA 09/13/15  Yes Lequita Asal, MD  gabapentin (NEURONTIN) 100 MG capsule Take 1 capsule (100 mg total) by mouth 3 (three) times daily. 03/15/15  Yes Dmitriy Berenzon, MD  hydrOXYzine (ATARAX/VISTARIL) 10 MG tablet Take 1 tablet (10 mg total) by mouth 3 (three) times daily as needed for itching. 10/05/15  Yes Lequita Asal, MD  levothyroxine (SYNTHROID, LEVOTHROID) 88 MCG tablet Take 1 tablet (88 mcg total) by mouth daily before breakfast. Patient taking differently: Take 44 mcg by mouth daily before breakfast.  06/16/15  Yes Lequita Asal, MD  LORazepam (ATIVAN) 0.5 MG tablet Take 1 tablet (0.5 mg total) by mouth every 8 (eight) hours as needed for anxiety. 06/16/15  Yes Lequita Asal, MD  metoCLOPramide (REGLAN) 10 MG tablet Take 1 tablet (10 mg total) by mouth every 6 (six) hours as needed. 09/29/15  Yes Carrie Mew, MD  metoprolol succinate (TOPROL-XL) 25 MG 24 hr tablet Take 25 mg by mouth 2 (two) times daily.  09/26/15  Yes Historical Provider, MD  nitroGLYCERIN (NITROSTAT) 0.4 MG SL tablet Place 0.4 mg under the tongue every 5 (five) minutes as needed.  09/25/15  Yes Historical Provider, MD  ondansetron (ZOFRAN) 8 MG tablet Take 1 tablet (8 mg total) by  mouth every 8 (eight) hours as needed for nausea or vomiting. 09/07/15  Yes Lequita Asal, MD  oxyCODONE (OXY IR/ROXICODONE) 5 MG immediate release tablet 1 tablet every 3 - 4 hours as needed for pain 11/20/15  Yes Lequita Asal, MD  pantoprazole (PROTONIX) 40 MG tablet Take 40 mg by mouth daily.  09/25/15 11/22/15 Yes Historical Provider, MD  predniSONE (DELTASONE) 10 MG tablet 10/5-10/8 - take 4 tabs; 10/9-10/15 - take 3 tabs; 10/16-10/22 - take 2 tabs; 10/23-10/29 - take 1 tabs 10/25/15  Yes Historical Provider, MD  QUEtiapine (SEROQUEL) 50 MG tablet Take 50 mg by mouth daily.  07/18/15 01/14/16 Yes Historical Provider, MD  traMADol-acetaminophen (ULTRACET) 37.5-325 MG tablet Take 1 tablet by mouth every 6 (six) hours as needed.   Yes Historical Provider, MD  valACYclovir (VALTREX) 500 MG tablet Take 500 mg  by mouth daily.  10/11/15   Historical Provider, MD      VITAL SIGNS:  Blood pressure (!) 150/82, pulse (!) 113, temperature 98.2 F (36.8 C), temperature source Oral, resp. rate (!) 25, height '5\' 8"'$  (1.727 m), weight 61.2 kg (135 lb), SpO2 92 %.  PHYSICAL EXAMINATION:  GENERAL:  55 y.o.-year-old patient lying in the bed with no acute distress.  EYES: Pupils equal, round, reactive to light and accommodation. No scleral icterus. Extraocular muscles intact.  HEENT: Head atraumatic, normocephalic. Oropharynx and nasopharynx clear.  NECK:  Supple, no jugular venous distention. No thyroid enlargement, no tenderness.  LUNGS: decreased  breath sounds bilaterally. CARDIOVASCULAR: S1, S2 tachycardic, No murmurs, rubs, or gallops.  ABDOMEN: Soft, nontender, nondistended. Bowel sounds present. No organomegaly or mass.  EXTREMITIES: No pedal edema, cyanosis, or clubbing.  NEUROLOGIC: Cranial nerves II through XII are intact. Muscle strength 5/5 in all extremities. Sensation intact. Gait not checked.  PSYCHIATRIC: The patient is alert and oriented x 3.  SKIN: No obvious rash, lesion, or ulcer.    LABORATORY PANEL:   CBC  Recent Labs Lab 11/22/15 1240  WBC 7.9  HGB 12.2*  HCT 37.4*  PLT 119*   ------------------------------------------------------------------------------------------------------------------  Chemistries   Recent Labs Lab 11/22/15 1240  NA 134*  K 4.3  CL 91*  CO2 27  GLUCOSE 84  BUN 22*  CREATININE 1.51*  CALCIUM 10.1  AST 38  ALT 19  ALKPHOS 130*  BILITOT 1.4*   ------------------------------------------------------------------------------------------------------------------  Cardiac Enzymes No results for input(s): TROPONINI in the last 168 hours. ------------------------------------------------------------------------------------------------------------------  RADIOLOGY:  Dg Abd Acute W/chest  Result Date: 11/22/2015 CLINICAL DATA:  Abdomen chest pain and cough. Hypoxia. History of lung cancer and bony metastatic to these. Recent diarrhea. EXAM: DG ABDOMEN ACUTE W/ 1V CHEST COMPARISON:  CT chest 11/04/2015 FINDINGS: Stable post radiation changes in the perihilar right lung. Emphysema. No superimposed airspace disease, pleural effusion, or pneumothorax. Right IJ Port-A-Cath terminates in the superior vena cava atrial junction. Negative for free intraperitoneal air. Bowel gas pattern is within normal limits. No significant stool burden. Inferior vena cava filter projects to the right of L3. Postoperative changes of lumbar spine fusion. IMPRESSION: Stable post radiation changes of the right lung. Emphysema. No acute superimposed abnormality identified. Nonobstructive bowel gas pattern. Electronically Signed   By: Curlene Dolphin M.D.   On: 11/22/2015 16:25    EKG:   Orders placed or performed during the hospital encounter of 11/04/15  . ED EKG within 10 minutes  . ED EKG within 10 minutes   Sinus tachycardia 1 17 bpm no ST-T changes. IMPRESSION AND PLAN:   Acute respiratory failure with hypoxia likely secondary to progression of the  underlying malignancy , possible atypical pneumonia: Chest x-ray negative for pneumonia but the patient's symptoms of cough shortness of breath or concerning. Also has lactic acidosis with lactic acid 2.1. Empirically treat with the IV antibiotics with Rocephin, Zithromax, follow clinical course. Continue oxygen 2 L to Saturation more than 90%. Likely need oxygen at home.  #2 sinus tachycardia likely secondary to due to dehydration: IV fluids, continue metoprolol #3 acute renal failure with ATN due to dehydration: Continue IV hydration. #4/chronic back pain, pain related to cancer: Patient is on fentanyl patch, oxycodone: Continue them. #5 history of bilateral PE: Patient already on full dose Lovenox. Unable to  Do ct angio chest today because of her renal failure. Consult oncology Dr. Mike Gip.    All the records are reviewed and case  discussed with ED provider. Management plans discussed with the patient, family and they are in agreement.  CODE STATUS: Full code  TOTAL TIME TAKING CARE OF THIS PATIENT: 91mnutes.    KEpifanio LeschesM.D on 11/22/2015 at 4:48 PM  Between 7am to 6pm - Pager - [phone]  After 6pm go to [email or web address] - password [email or web address]  Office  [phone] CC: Primary care physician; MMarinda Elk MD  Note: This dictation was prepared with Dragon dictation along with smaller phrase technology. Any transcriptional errors that result from this process are unintentional.

## 2015-11-22 NOTE — ED Notes (Signed)
Patient placed on 2L O2 via [place] for O2 sats of 88%. Sats increased to 98%.

## 2015-11-23 ENCOUNTER — Inpatient Hospital Stay: Payer: BLUE CROSS/BLUE SHIELD

## 2015-11-23 ENCOUNTER — Inpatient Hospital Stay: Payer: BLUE CROSS/BLUE SHIELD | Admitting: Hematology and Oncology

## 2015-11-23 DIAGNOSIS — E860 Dehydration: Secondary | ICD-10-CM

## 2015-11-23 DIAGNOSIS — Z79899 Other long term (current) drug therapy: Secondary | ICD-10-CM

## 2015-11-23 DIAGNOSIS — R0902 Hypoxemia: Secondary | ICD-10-CM

## 2015-11-23 DIAGNOSIS — C7800 Secondary malignant neoplasm of unspecified lung: Secondary | ICD-10-CM

## 2015-11-23 DIAGNOSIS — C7972 Secondary malignant neoplasm of left adrenal gland: Secondary | ICD-10-CM

## 2015-11-23 DIAGNOSIS — B029 Zoster without complications: Secondary | ICD-10-CM

## 2015-11-23 DIAGNOSIS — D509 Iron deficiency anemia, unspecified: Secondary | ICD-10-CM

## 2015-11-23 DIAGNOSIS — G8929 Other chronic pain: Secondary | ICD-10-CM

## 2015-11-23 DIAGNOSIS — K529 Noninfective gastroenteritis and colitis, unspecified: Secondary | ICD-10-CM

## 2015-11-23 DIAGNOSIS — C3411 Malignant neoplasm of upper lobe, right bronchus or lung: Secondary | ICD-10-CM

## 2015-11-23 DIAGNOSIS — R5383 Other fatigue: Secondary | ICD-10-CM

## 2015-11-23 DIAGNOSIS — Z9221 Personal history of antineoplastic chemotherapy: Secondary | ICD-10-CM

## 2015-11-23 DIAGNOSIS — C7971 Secondary malignant neoplasm of right adrenal gland: Secondary | ICD-10-CM

## 2015-11-23 DIAGNOSIS — Z923 Personal history of irradiation: Secondary | ICD-10-CM

## 2015-11-23 LAB — BASIC METABOLIC PANEL
Anion gap: 9 (ref 5–15)
BUN: 15 mg/dL (ref 6–20)
CHLORIDE: 98 mmol/L — AB (ref 101–111)
CO2: 28 mmol/L (ref 22–32)
CREATININE: 1.22 mg/dL (ref 0.61–1.24)
Calcium: 9.3 mg/dL (ref 8.9–10.3)
GFR calc Af Amer: >60 mL/min (ref >60)
GFR calc non Af Amer: >60 mL/min (ref >60)
Glucose, Bld: 76 mg/dL (ref 65–99)
Potassium: 4.2 mmol/L (ref 3.5–5.1)
Sodium: 135 mmol/L (ref 135–145)

## 2015-11-23 LAB — CBC
HCT: 29.5 % — ABNORMAL LOW (ref 40.0–52.0)
Hemoglobin: 10 g/dL — ABNORMAL LOW (ref 13.0–18.0)
MCH: 29.9 pg (ref 26.0–34.0)
MCHC: 33.8 g/dL (ref 32.0–36.0)
MCV: 88.4 fL (ref 80.0–100.0)
PLATELETS: 92 10*3/uL — AB (ref 150–440)
RBC: 3.33 MIL/uL — ABNORMAL LOW (ref 4.40–5.90)
RDW: 16.5 % — AB (ref 11.5–14.5)
WBC: 5.5 10*3/uL (ref 3.8–10.6)

## 2015-11-23 LAB — GLUCOSE, CAPILLARY: Glucose-Capillary: 80 mg/dL (ref 65–99)

## 2015-11-23 NOTE — Care Management (Signed)
Patient presented to ED with shortness of breath.  He is being treated for metastatic lung cancer.  Current 02 requirement is acute.  Followed by [facility] cancer .  On chronic lovenox for chronic pulmonary embolus

## 2015-11-23 NOTE — Evaluation (Signed)
Physical Therapy Evaluation Patient Details Name: KEMONTAE DUNKLEE MRN: [id number] DOB: 04/07/1960 Today's Date: 11/23/2015   History of Present Illness  55 y/o male here with difficulty breathing, hypoxia.  He has lung cancer with metastacies, had b/l PEs 8/31 and generally has been needing increased care and hospitalization recently.   Clinical Impression  Pt generally shows good effort and good relative safety but had significant O2 saturation issues with even light activity. He was in the mid 90s on 4-5 liters at rest, with 60-90 seconds on room air (and minimal/no activity) he dropped to low 80s.  On 5 liters he did slowly increase back to ~90% with focused breathing, but during ambulation on 6 liters he dropped again as low as 79% rather quickly.  Pt normally does not need an AD, but was reliant on walker today to maintain balance/safety.  Overall he should be safe to return home (w/ HHPT and walker) from a mobility/safety standpoint, but his activity tolerance was very limited at this time.    Follow Up Recommendations Home health PT    Equipment Recommendations  Rolling walker with 5" wheels    Recommendations for Other Services       Precautions / Restrictions Precautions Precautions: Fall Restrictions Weight Bearing Restrictions: No      Mobility  Bed Mobility Overal bed mobility: Modified Independent             General bed mobility comments: Pt able to get to EOB and back into bed w/ only minimal rail use, shows good effort  Transfers Overall transfer level: Modified independent Equipment used: Rolling walker (2 wheeled)             General transfer comment: Pt with definite need of walker to maintain balance in standing.   Ambulation/Gait Ambulation/Gait assistance: Min assist;Min guard Ambulation Distance (Feet): 80 Feet Assistive device: Rolling walker (2 wheeled) (6 liters O2)       General Gait Details: Pt with slow, hesitant ambulation but no  LOBs with heavy UE use of walker.  His O2 was in the low 90s at the start, dropped to high 80s but he felt okay after ~30 ft, by the time we returned to room he had dropped to 79% and HR was nearly 130.  Pt was fatigued and had signficant change in vitals with the effort, but ultimately with the walker he was safe regarding balance and cadence.  Stairs            Wheelchair Mobility    Modified Rankin (Stroke Patients Only)       Balance Overall balance assessment: Needs assistance Sitting-balance support: No upper extremity supported Sitting balance-Leahy Scale: Good Sitting balance - Comments: able to maintain sitting balance w/o issue   Standing balance support: Bilateral upper extremity supported Standing balance-Leahy Scale: Fair Standing balance comment: Pt highly reliant on UEs to maintain balance in standing                             Pertinent Vitals/Pain Pain Assessment:  (reports only mild chronic neck/shoudler pain)    Home Living Family/patient expects to be discharged to:: Private residence Living Arrangements: Spouse/significant other;Children;Other relatives Available Help at Discharge: Family   Home Access: Stairs to enter Entrance Stairs-Rails: Right Entrance Stairs-Number of Steps: 4   Home Equipment: None      Prior Function Level of Independence: Independent with assistive device(s)  Comments: Pt reports he could get out weekly for church, but otherwise was not very active     Hand Dominance        Extremity/Trunk Assessment   Upper Extremity Assessment: Generalized weakness (pt with limited over head elevation, grossly 3+/5 t/o)           Lower Extremity Assessment: Generalized weakness;Overall [facility] for tasks assessed (grossly 4-/5 t/o, equal bilaterally)         Communication   Communication:  (pt appeared to have some difficulty following appropriately.)  Cognition Arousal/Alertness:  Awake/alert Behavior During Therapy: WFL for tasks assessed/performed Overall Cognitive Status: Within Functional Limits for tasks assessed (pt did need some extra cuing/encouragement at times)                      General Comments      Exercises     Assessment/Plan    PT Assessment Patient needs continued PT services  PT Problem List Decreased strength;Decreased activity tolerance;Decreased range of motion;Decreased balance;Decreased mobility;Decreased coordination;Decreased cognition;Decreased knowledge of use of DME;Decreased safety awareness;Cardiopulmonary status limiting activity          PT Treatment Interventions Gait training;DME instruction;Stair training;Functional mobility training;Therapeutic activities;Therapeutic exercise;Balance training;Neuromuscular re-education;Patient/family education    PT Goals (Current goals can be found in the Care Plan section)  Acute Rehab PT Goals Patient Stated Goal: go home PT Goal Formulation: With patient Time For Goal Achievement: 2016-01-02 Potential to Achieve Goals: Fair    Frequency Min 2X/week   Barriers to discharge        Co-evaluation               End of Session Equipment Utilized During Treatment: Gait belt;Oxygen (4 liters in bed, 6 liters with ambulation) Activity Tolerance: Patient limited by fatigue Patient left: with bed alarm set;with call bell/phone within reach           Time: 1427-1450 PT Time Calculation (min) (ACUTE ONLY): 23 min   Charges:   PT Evaluation $PT Eval Low Complexity: 1 Procedure     PT G CodesKreg Shropshire, DPT 11/23/2015, 4:27 PM

## 2015-11-23 NOTE — Progress Notes (Signed)
[place] [facility] [place] NAME: Darryl Hatfield    MR#:  [id number]  DATE OF BIRTH:  11/29/1960  SUBJECTIVE:   Patient here with cough and SOB not much improvement since admission  REVIEW OF SYSTEMS:    Review of Systems  Constitutional: Negative for chills, fever and malaise/fatigue.  HENT: Negative.  Negative for ear discharge, ear pain, hearing loss, nosebleeds and sore throat.   Eyes: Negative.  Negative for blurred vision and pain.  Respiratory: Positive for cough and shortness of breath. Negative for hemoptysis and wheezing.   Cardiovascular: Negative.  Negative for chest pain, palpitations and leg swelling.  Gastrointestinal: Negative.  Negative for abdominal pain, blood in stool, diarrhea, nausea and vomiting.  Genitourinary: Negative.  Negative for dysuria.  Musculoskeletal: Negative.  Negative for back pain.  Skin: Negative.   Neurological: Positive for weakness. Negative for dizziness, tremors, speech change, focal weakness, seizures and headaches.  Endo/Heme/Allergies: Negative.  Does not bruise/bleed easily.  Psychiatric/Behavioral: Negative.  Negative for depression, hallucinations and suicidal ideas.    Tolerating Diet:yes      DRUG ALLERGIES:   Allergies  Allergen Reactions  . No Known Allergies     VITALS:  Blood pressure 121/78, pulse 99, temperature 97.8 F (36.6 C), temperature source Oral, resp. rate 18, height '5\' 8"'$  (1.727 m), weight 61 kg (134 lb 6.4 oz), SpO2 100 %.  PHYSICAL EXAMINATION:   Physical Exam  Constitutional: He is oriented to person, place, and time and well-developed, well-nourished, and in no distress. No distress.  HENT:  Head: Normocephalic.  Eyes: No scleral icterus.  Neck: Normal range of motion. Neck supple. No JVD present. No tracheal deviation present.  Cardiovascular: Normal rate, regular rhythm and normal heart sounds.  Exam reveals no gallop and no friction rub.   No murmur  heard. Pulmonary/Chest: Effort normal. No respiratory distress. He has no wheezes. He has rales. He exhibits no tenderness.  Bilateral rhonchi and decreased breath sounds throughout  Abdominal: Soft. Bowel sounds are normal. He exhibits no distension and no mass. There is no tenderness. There is no rebound and no guarding.  Musculoskeletal: Normal range of motion. He exhibits no edema.  Neurological: He is alert and oriented to person, place, and time.  Skin: Skin is warm. No rash noted. No erythema.  Psychiatric: Affect and judgment normal.      LABORATORY PANEL:   CBC  Recent Labs Lab 11/23/15 0507  WBC 5.5  HGB 10.0*  HCT 29.5*  PLT 92*   ------------------------------------------------------------------------------------------------------------------  Chemistries   Recent Labs Lab 11/22/15 1240 11/23/15 0507  NA 134* 135  K 4.3 4.2  CL 91* 98*  CO2 27 28  GLUCOSE 84 76  BUN 22* 15  CREATININE 1.51* 1.22  CALCIUM 10.1 9.3  AST 38  --   ALT 19  --   ALKPHOS 130*  --   BILITOT 1.4*  --    ------------------------------------------------------------------------------------------------------------------  Cardiac Enzymes No results for input(s): TROPONINI in the last 168 hours. ------------------------------------------------------------------------------------------------------------------  RADIOLOGY:  Dg Abd Acute W/chest  Result Date: 11/22/2015 CLINICAL DATA:  Abdomen chest pain and cough. Hypoxia. History of lung cancer and bony metastatic to these. Recent diarrhea. EXAM: DG ABDOMEN ACUTE W/ 1V CHEST COMPARISON:  CT chest 11/04/2015 FINDINGS: Stable post radiation changes in the perihilar right lung. Emphysema. No superimposed airspace disease, pleural effusion, or pneumothorax. Right IJ Port-A-Cath terminates in the superior vena cava atrial junction. Negative for free intraperitoneal air. Bowel  gas pattern is within normal limits. No significant stool  burden. Inferior vena cava filter projects to the right of L3. Postoperative changes of lumbar spine fusion. IMPRESSION: Stable post radiation changes of the right lung. Emphysema. No acute superimposed abnormality identified. Nonobstructive bowel gas pattern. Electronically Signed   By: Curlene Dolphin M.D.   On: 11/22/2015 16:25     ASSESSMENT AND PLAN:   55 year old male with a history of progressive lung cancer who presents with shortness of breath   1. Acute hypoxic respiratory failure due to progression of underlying lung cancer with pneumonia. Continue Rocephin and azithromycin. Continue to try to wean oxygen.   2. Acute kidney injury: This is due to prerenal azotemia and has improved with IV fluids.  3. 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 Depression: Continue Cymbalta  5. Hypothyroidism: Continue Synthroid  6. Essential hypertension: Continue metoprolol    Management plans discussed with the patient and he is in agreement.  CODE STATUS: full  TOTAL TIME TAKING CARE OF THIS PATIENT: 30 minutes.     POSSIBLE D/C 2 days, DEPENDING ON CLINICAL CONDITION.   Acelin Ferdig M.D on 11/23/2015 at 11:06 AM  Between 7am to 6pm - Pager - [phone] After 6pm go to [email or web address] - password [email or web address] [facility]  Office  [phone]  CC: Primary care physician; Marinda Elk, MD  Note: This dictation was prepared with Dragon dictation along with smaller phrase technology. Any transcriptional errors that result from this process are unintentional.

## 2015-11-24 ENCOUNTER — Inpatient Hospital Stay: Payer: BLUE CROSS/BLUE SHIELD

## 2015-11-24 LAB — GLUCOSE, CAPILLARY: GLUCOSE-CAPILLARY: 99 mg/dL (ref 65–99)

## 2015-11-24 MED ORDER — IPRATROPIUM-ALBUTEROL 0.5-2.5 (3) MG/3ML IN SOLN
3.0000 mL | RESPIRATORY_TRACT | Status: DC
  Administered 2015-11-24 – 2015-11-26 (×14): 3 mL via RESPIRATORY_TRACT
  Filled 2015-11-24 (×13): qty 3

## 2015-11-24 MED ORDER — FUROSEMIDE 10 MG/ML IJ SOLN
40.0000 mg | Freq: Once | INTRAMUSCULAR | Status: AC
  Administered 2015-11-24: 40 mg via INTRAVENOUS
  Filled 2015-11-24: qty 4

## 2015-11-24 MED ORDER — IPRATROPIUM-ALBUTEROL 0.5-2.5 (3) MG/3ML IN SOLN
RESPIRATORY_TRACT | Status: AC
  Filled 2015-11-24: qty 3

## 2015-11-24 MED ORDER — METHYLPREDNISOLONE SODIUM SUCC 40 MG IJ SOLR
40.0000 mg | Freq: Two times a day (BID) | INTRAMUSCULAR | Status: DC
  Administered 2015-11-24 – 2015-11-25 (×2): 40 mg via INTRAVENOUS
  Filled 2015-11-24 (×2): qty 1

## 2015-11-24 MED ORDER — AZITHROMYCIN 250 MG PO TABS: 500.0000 mg | ORAL_TABLET | ORAL | Status: DC

## 2015-11-24 MED ORDER — AZITHROMYCIN 250 MG PO TABS
500.0000 mg | ORAL_TABLET | ORAL | Status: DC
  Administered 2015-11-24 – 2015-11-25 (×2): 500 mg via ORAL
  Filled 2015-11-24 (×2): qty 2

## 2015-11-24 MED ORDER — SENNOSIDES-DOCUSATE SODIUM 8.6-50 MG PO TABS
1.0000 | ORAL_TABLET | Freq: Two times a day (BID) | ORAL | Status: DC
  Administered 2015-11-24 – 2015-11-25 (×4): 1 via ORAL
  Filled 2015-11-24 (×5): qty 1

## 2015-11-24 NOTE — Care Management (Addendum)
Patient current 02 needs are still high- 5 liters and unable to wean.  It has not been feasible to perform an assessment for home 02 due to the respiratory distress that results when attempts are made to decrease liter flow.  At discharge patient may benefit from home health SN.  Physical therapy recommended home health PT.  Patient wants to remain a full code.  Spoke with patient and his wife and agency preference for home health is [facility].  Agreeable to home health PT and SN.  Would want agency to also provide oxygen

## 2015-11-24 NOTE — Progress Notes (Signed)
Family Meeting Note  Advance Directive:no  Today a meeting took place with the Patient.    The following clinical team members were present during this meeting:MD Dr Olivine Hiers  The following were discussed:Patient's diagnosis: acute hypoxic resp failure/PNA/COPD with lung cancer , Patient's progosis:currently still requiring 5 liters of O2  Hope to have some improvement in breathing and decreased need for O2. Wants to remain FULL CODE Fair prognosis Goals for treatment: Full Code  Continue current treatment  Additional follow-up to be provided: patient wants to be full code and wants to continue with plan for chemotherapy after discahrge  Time spent during discussion:15 minutes  Biana Haggar, MD

## 2015-11-24 NOTE — Progress Notes (Addendum)
[place] at [place] NAME: Quantay Zaremba    MR#:  [id number]  DATE OF BIRTH:  Sep 21, 1960  SUBJECTIVE:   Patient Still with shortness of breath and cough. He has back pain.  REVIEW OF SYSTEMS:    Review of Systems  Constitutional: Negative for chills, fever and malaise/fatigue.  HENT: Negative.  Negative for ear discharge, ear pain, hearing loss, nosebleeds and sore throat.   Eyes: Negative.  Negative for blurred vision and pain.  Respiratory: Positive for cough and shortness of breath. Negative for hemoptysis and wheezing.   Cardiovascular: Negative.  Negative for chest pain, palpitations and leg swelling.  Gastrointestinal: Negative.  Negative for abdominal pain, blood in stool, diarrhea, nausea and vomiting.  Genitourinary: Negative.  Negative for dysuria.  Musculoskeletal: Positive for back pain.  Skin: Negative.   Neurological: Positive for weakness. Negative for dizziness, tremors, speech change, focal weakness, seizures and headaches.  Endo/Heme/Allergies: Negative.  Does not bruise/bleed easily.  Psychiatric/Behavioral: Negative.  Negative for depression, hallucinations and suicidal ideas.    Tolerating Diet:yes      DRUG ALLERGIES:   Allergies  Allergen Reactions  . No Known Allergies     VITALS:  Blood pressure 123/74, pulse 95, temperature 97.8 F (36.6 C), temperature source Oral, resp. rate 20, height '5\' 8"'$  (1.727 m), weight 62.5 kg (137 lb 11.2 oz), SpO2 96 %.  PHYSICAL EXAMINATION:   Physical Exam  Constitutional: He is oriented to person, place, and time and well-developed, well-nourished, and in no distress. No distress.  HENT:  Head: Normocephalic.  Eyes: No scleral icterus.  Neck: Normal range of motion. Neck supple. No JVD present. No tracheal deviation present.  Cardiovascular: Normal rate, regular rhythm and normal heart sounds.  Exam reveals no gallop and no friction rub.   No murmur  heard. Pulmonary/Chest: Effort normal. No respiratory distress. He has wheezes. He has no rales. He exhibits no tenderness.  Bilateral rhonchi and decreased breath sounds throughout  Abdominal: Soft. Bowel sounds are normal. He exhibits no distension and no mass. There is no tenderness. There is no rebound and no guarding.  Musculoskeletal: Normal range of motion. He exhibits no edema.  Neurological: He is alert and oriented to person, place, and time.  Skin: Skin is warm. No rash noted. No erythema.  Psychiatric: Affect and judgment normal.      LABORATORY PANEL:   CBC  Recent Labs Lab 11/23/15 0507  WBC 5.5  HGB 10.0*  HCT 29.5*  PLT 92*   ------------------------------------------------------------------------------------------------------------------  Chemistries   Recent Labs Lab 11/22/15 1240 11/23/15 0507  NA 134* 135  K 4.3 4.2  CL 91* 98*  CO2 27 28  GLUCOSE 84 76  BUN 22* 15  CREATININE 1.51* 1.22  CALCIUM 10.1 9.3  AST 38  --   ALT 19  --   ALKPHOS 130*  --   BILITOT 1.4*  --    ------------------------------------------------------------------------------------------------------------------  Cardiac Enzymes No results for input(s): TROPONINI in the last 168 hours. ------------------------------------------------------------------------------------------------------------------  RADIOLOGY:  Dg Chest 1 View  Result Date: 11/24/2015 CLINICAL DATA:  Chest pain. Hypoxia. Cough. Known right-sided lung cancer. EXAM: CHEST 1 VIEW COMPARISON:  November 22, 2015 FINDINGS: Postradiation changes in the right hilar region are stable. No pneumothorax. The right Port-A-Cath is stable. Mild pulmonary venous congestion. No other acute abnormalities. IMPRESSION: Mild pulmonary venous congestion.  No other interval changes. Electronically Signed   By: Dorise Bullion III M.D   On: 11/24/2015  08:34   Dg Abd Acute W/chest  Result Date: 11/22/2015 CLINICAL DATA:   Abdomen chest pain and cough. Hypoxia. History of lung cancer and bony metastatic to these. Recent diarrhea. EXAM: DG ABDOMEN ACUTE W/ 1V CHEST COMPARISON:  CT chest 11/04/2015 FINDINGS: Stable post radiation changes in the perihilar right lung. Emphysema. No superimposed airspace disease, pleural effusion, or pneumothorax. Right IJ Port-A-Cath terminates in the superior vena cava atrial junction. Negative for free intraperitoneal air. Bowel gas pattern is within normal limits. No significant stool burden. Inferior vena cava filter projects to the right of L3. Postoperative changes of lumbar spine fusion. IMPRESSION: Stable post radiation changes of the right lung. Emphysema. No acute superimposed abnormality identified. Nonobstructive bowel gas pattern. Electronically Signed   By: Curlene Dolphin M.D.   On: 11/22/2015 16:25     ASSESSMENT AND PLAN:   55 year old male with a history of progressive lung cancer who presents with shortness of breath   1. Acute hypoxic respiratory failure due to progression of underlying lung cancer with pneumonia. Chest x-ray this morning shows possible edema.  Recent echocardiogram 10/17 shows normal ejection fraction without valvular abnormalities or diastolic dysfunction. Try 1 dose of IV Lasix 40 mg. Repeat chest x-ray in a.m. Continue Rocephin and azithromycin. Continue to try to wean oxygen.  Blood cultures negative  2. Acute kidney injury: This is due to prerenal azotemia and has improved with IV fluids.  3. 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 Continue fentanyl  Patient wishes to pursue treatment after discharge from hospital. 4. Depression: Continue Cymbalta  5. Hypothyroidism: Continue Synthroid  6. Essential hypertension: Continue metoprolol  7. History of COPD with wheezing on examination today: I will start IV steroids and continue DuoNeb's. Wheezing may  be from COPD or underlying pneumonia/lung mass as well, but due to significant hypoxic respiratory failure he may benefit from this.  8. HX PE: Continue LOVENOX  Management plans discussed with the patient and he is in agreement.  CODE STATUS: full  TOTAL TIME TAKING CARE OF THIS PATIENT: 28 minutes.     POSSIBLE D/C 2 days, DEPENDING ON CLINICAL CONDITION.   Essie Lagunes M.D on 11/24/2015 at 11:23 AM  Between 7am to 6pm - Pager - [phone] After 6pm go to [email or web address] - password [email or web address] [facility]  Office  [phone]  CC: Primary care physician; Marinda Elk, MD  Note: This dictation was prepared with Dragon dictation along with smaller phrase technology. Any transcriptional errors that result from this process are unintentional.

## 2015-11-24 NOTE — Consult Note (Signed)
[facility]  Date of admission:  11/22/2015  Inpatient day:   11/23/2015  Consulting physician: Dr Bettey Costa  Reason for Consultation:  Patient with progressive lung cancer please evaluate  Chief Complaint: Darryl Hatfield is a 55 y.o. 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  HPI:  The patient was diagnosed with stage IIIA squamous cell lung cancerin 07/02/2013. He received concurrent chemotherapy and radiation.   PET scan on 04/12/2015 revealed progressive disease.CT guided right adrenal biopsy on 04/25/2015 revealed metastatic adenocarcinoma of the lung. He received 5cycles of carboplatin and Alimta(05/25/2015 - 08/17/2015).   PET scanon 09/06/2015 revealed interval progressive disease. He received 1 cycle of nivolumab (09/14/2015).  Course was complicated by colitis. He is on a prednisone taper (current dose 10 mg a day).  He was admitted to [facility] from 09/17/2015 - 09/26/2015 with an NSTEMI.  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 There was 50% mid LAD and mid RCA lesion.  Chest CT angiogram on 09/21/2015 confirmed bilateral pulmonary emboli. Bilateral lower extremity duplex on 09/22/2015 revealed bilateral femoral DVTs.  An IVC filter was placed.  He is on Lovenox .  He was admitted to [facility] from 10/13/2015 -  10/25/2015 secondary to a flare of his colitis.  He was admitted to [facility] from 11/04/2015 - 11/05/2015 with generalized abdominal pain and chest pain.  Hemoglobin was 7.6.  He was transfused with 2 units of PRBCs to a hemoglobin of 14.  Chest CT angiogram on 11/04/2015 revealed no evidence of pulmonary embolism.  There was interval progression of mediastinal, right axillary/subpectoral, and left supraclavicular lymphadenopathy.   He notes progressive shortness of  breath since 11/19/2015. He states that he had an episode of emesis in 11/21/2015.  He states that he does not think that he choked or aspirated.  Subsequently he has felt hot and cold. He denies any fevers. He notes shortness of breath with any activity. He has had increasing pain in his ribs. He was recently started on a Fentanyl patch secondary to increasing use of oral pain medications.  He is now sleep sleeping sitting up. Oral intake has been poor.   He was scheduled to begin chemotherapy (Taxotere) on 11/22/2015.  He was seen in the ER on 11/22/2015 with emesis, shortness of breath, and bleeding from the right ear.  He had a scratch in his ear.  CXR on 11/22/2015 revealed stable post radiation changes of the right lung. He has smphysema.  There was no acute superimposed abnormality identified.  He was admitted secondary to new hypoxia.  He was dehydrated with increased creatinine.   Past Medical History:  Diagnosis Date  . CHF (congestive heart failure) ([place])   . COPD (chronic obstructive pulmonary disease) ([place])   . Hypothyroidism   . Lung cancer (Darryl Hatfield) 2015   RIght   . Seizures (Darryl Hatfield)     Past Surgical History:  Procedure Laterality Date  . BACK SURGERY    . COLONOSCOPY  10/2015  . PORTACATH PLACEMENT Right     Family History  Problem Relation Age of Onset  . Hypertension Other   . Heart failure Other   . Cancer Father     Social History:  reports that he quit smoking about 2 years ago. He has a 40.00 pack-year smoking history. He has never used smokeless tobacco. He reports that  he does not drink alcohol or use drugs.  His wife's name is Darryl Hatfield.  He is alone today.  Allergies:  Allergies  Allergen Reactions  . No Known Allergies     Medications Prior to Admission  Medication Sig Dispense Refill  . acetaminophen (TYLENOL) 325 MG tablet Take 650 mg by mouth every 6 (six) hours as needed for mild pain.     Marland Kitchen albuterol (PROVENTIL) (2.5 MG/3ML) 0.083% nebulizer solution  Take 3 mLs (2.5 mg total) by nebulization every 6 (six) hours as needed for wheezing or shortness of breath. (Patient taking differently: Take 2.5 mg by nebulization 2 (two) times daily. ) 75 mL 2  . aspirin 81 MG EC tablet Take 81 mg by mouth daily.   0  . atorvastatin (LIPITOR) 80 MG tablet Take 80 mg by mouth daily.     . Calcium Carbonate-Vitamin D 600-400 MG-UNIT tablet Take 1 tablet by mouth 2 (two) times daily.     Marland Kitchen dexamethasone (DECADRON) 4 MG tablet Take 8 mg (2 pills) twice a day on the day before Taxotere and on the day after Taxotere. 32 tablet 0  . DULoxetine (CYMBALTA) 30 MG capsule Take 60 mg by mouth daily.     Marland Kitchen enoxaparin (LOVENOX) 60 MG/0.6ML injection Inject 0.6 mLs (60 mg total) into the skin every 12 (twelve) hours. 60 Syringe 0  . fentaNYL (DURAGESIC - DOSED MCG/HR) 12 MCG/HR Place 1 patch (12.5 mcg total) onto the skin every 3 (three) days. 5 patch 0  . folic acid (FOLVITE) 1 MG tablet TAKE 1 TABLET BY MOUTH DAILY START 5-7 DAYS BEFORE ALIMTA CHEMO, CONTINUE UNTIL 21 DAYS AFTER ALIMTA 100 tablet 3  . gabapentin (NEURONTIN) 100 MG capsule Take 1 capsule (100 mg total) by mouth 3 (three) times daily. 90 capsule 4  . hydrOXYzine (ATARAX/VISTARIL) 10 MG tablet Take 1 tablet (10 mg total) by mouth 3 (three) times daily as needed for itching. 60 tablet 1  . levothyroxine (SYNTHROID, LEVOTHROID) 88 MCG tablet Take 1 tablet (88 mcg total) by mouth daily before breakfast. (Patient taking differently: Take 44 mcg by mouth daily before breakfast. ) 30 tablet 0  . LORazepam (ATIVAN) 0.5 MG tablet Take 1 tablet (0.5 mg total) by mouth every 8 (eight) hours as needed for anxiety. 30 tablet 0  . metoCLOPramide (REGLAN) 10 MG tablet Take 1 tablet (10 mg total) by mouth every 6 (six) hours as needed. 30 tablet 0  . metoprolol succinate (TOPROL-XL) 25 MG 24 hr tablet Take 25 mg by mouth 2 (two) times daily.   2  . nitroGLYCERIN (NITROSTAT) 0.4 MG SL tablet Place 0.4 mg under the tongue every 5  (five) minutes as needed.   0  . ondansetron (ZOFRAN) 8 MG tablet Take 1 tablet (8 mg total) by mouth every 8 (eight) hours as needed for nausea or vomiting. 30 tablet 3  . oxyCODONE (OXY IR/ROXICODONE) 5 MG immediate release tablet 1 tablet every 3 - 4 hours as needed for pain 40 tablet 0  . pantoprazole (PROTONIX) 40 MG tablet Take 40 mg by mouth daily.     . predniSONE (DELTASONE) 10 MG tablet 10/5-10/8 - take 4 tabs; 10/9-10/15 - take 3 tabs; 10/16-10/22 - take 2 tabs; 10/23-10/29 - take 1 tabs    . QUEtiapine (SEROQUEL) 50 MG tablet Take 50 mg by mouth daily.     . traMADol-acetaminophen (ULTRACET) 37.5-325 MG tablet Take 1 tablet by mouth every 6 (six) hours as needed.    Marland Kitchen  valACYclovir (VALTREX) 500 MG tablet Take 500 mg by mouth daily.       Review of Systems: GENERAL:  Feels "bad".  Hot and cold sensation..  No sweats.  PERFORMANCE STATUS (ECOG):  2 HEENT:  No visual changes, runny nose, sore throat, mouth sores or tenderness. Lungs:  Shortness of breath. Cough.  Spitting up green phlegm.  No hemoptysis. Cardiac:  No chest pain, palpitations, orthopnea, or PND. GI:  Upper abdominal discomfort "feels hard".  Emesis.  No diarrhea, melena or hematochezia.  GU:  No urgency, frequency, dysuria, or hematuria. Musculoskeletal:  Right sided rib pain.  Back pain.  No muscle tenderness. Extremities:  No swelling or pain. Skin:  Bruising on arms and abdomen.  No rashes. Neuro:  No headache, numbness or weakness, balance or coordination issues. Endocrine:  No diabetes. Thyroid disease on Synthroid.  No hot flashes or night sweats. Psych:  Anxiety.  No mood changes or depression. Pain: Pain as noted above. Review of systems:  All other systems reviewed and found to be negative.  Physical Exam:  Blood pressure 115/71, pulse (!) 101, temperature (!) 96.9 F (36.1 C), temperature source Axillary, resp. rate (!) 22, height 5' 8"  (1.727 m), weight 134 lb 6.4 oz (61 kg), SpO2 97 %.  GENERAL:   Thin gentleman sitting on the medical unit in no acute distress.  MENTAL STATUS:  Alert and oriented to person, place and time. HEAD:  Black hair with slight graying.  Mustache with slight blood under nasal cannula. Normocephalic, atraumatic, face symmetric, no Cushingoid features. EYES:  Glasses.  Blue eyes.  Pupils equal round and reactive to light and accomodation.  No conjunctivitis or scleral icterus. ENT:  Spur in place.  Oropharynx clear without lesion.  Dentures.  Tongue normal. Mucous membranes moist.  RESPIRATORY:  Upper airway sounds with scattered bilateral rhonchi.  Rare soft wheeze. CARDIOVASCULAR:  Regular rate and rhythm without murmur, rub or gallop. CHEST WALL:  Pain on palpation over several ribs. ABDOMEN:  Soft, non-tender with active bowel sounds and no hepatosplenomegaly.  No masses. SKIN: Ecchymosis on upper extremities.  Right cheek erythema. EXTREMITIES:  Thin arms.  No lower extremity edema.  No skin discoloration or tenderness.  No palpable cords. LYMPH NODES: No palpable cervical, supraclavicular, axillary or inguinal adenopathy.  NEUROLOGICAL: Unremarkable. PSYCH:  Anxious.    Results for orders placed or performed during the hospital encounter of 11/22/15 (from the past 48 hour(s))  CBC with Differential     Status: Abnormal   Collection Time: 11/22/15 12:40 PM  Result Value Ref Range   WBC 7.9 3.8 - 10.6 K/uL   RBC 4.18 (L) 4.40 - 5.90 MIL/uL   Hemoglobin 12.2 (L) 13.0 - 18.0 g/dL   HCT 37.4 (L) 40.0 - 52.0 %   MCV 89.5 80.0 - 100.0 fL   MCH 29.3 26.0 - 34.0 pg   MCHC 32.7 32.0 - 36.0 g/dL   RDW 16.7 (H) 11.5 - 14.5 %   Platelets 119 (L) 150 - 440 K/uL   Neutrophils Relative % 71 %   Lymphocytes Relative 16 %   Monocytes Relative 11 %   Eosinophils Relative 2 %   Basophils Relative 0 %   Band Neutrophils 0 %   Metamyelocytes Relative 0 %   Myelocytes 0 %   Promyelocytes Absolute 0 %   Blasts 0 %   nRBC 1 (H) 0 /100 WBC   Other 0 %   Neutro Abs 5.5  1.4 - 6.5 K/uL  Lymphs Abs 1.3 1.0 - 3.6 K/uL   Monocytes Absolute 0.9 0.2 - 1.0 K/uL   Eosinophils Absolute 0.2 0 - 0.7 K/uL   Basophils Absolute 0.0 0 - 0.1 K/uL   RBC Morphology POLYCHROMASIA PRESENT     Comment: MIXED RBC POPULATION  Basic metabolic panel     Status: Abnormal   Collection Time: 11/22/15 12:40 PM  Result Value Ref Range   Sodium 134 (L) 135 - 145 mmol/L   Potassium 4.3 3.5 - 5.1 mmol/L   Chloride 91 (L) 101 - 111 mmol/L   CO2 27 22 - 32 mmol/L   Glucose, Bld 84 65 - 99 mg/dL   BUN 22 (H) 6 - 20 mg/dL   Creatinine, Ser 1.51 (H) 0.61 - 1.24 mg/dL   Calcium 10.1 8.9 - 10.3 mg/dL   GFR calc non Af Amer 50 (L) >60 mL/min   GFR calc Af Amer 58 (L) >60 mL/min    Comment: (NOTE) The eGFR has been calculated using the CKD EPI equation. This calculation has not been validated in all clinical situations. eGFR's persistently <60 mL/min signify possible Chronic Kidney Disease.    Anion gap 16 (H) 5 - 15  APTT     Status: Abnormal   Collection Time: 11/22/15 12:40 PM  Result Value Ref Range   aPTT 60 (H) 24 - 36 seconds    Comment:        IF BASELINE aPTT IS ELEVATED, SUGGEST PATIENT RISK ASSESSMENT BE USED TO DETERMINE APPROPRIATE ANTICOAGULANT THERAPY.   Protime-INR     Status: Abnormal   Collection Time: 11/22/15 12:40 PM  Result Value Ref Range   Prothrombin Time 16.4 (H) 11.4 - 15.2 seconds   INR 1.31   Hepatic function panel     Status: Abnormal   Collection Time: 11/22/15 12:40 PM  Result Value Ref Range   Total Protein 6.9 6.5 - 8.1 g/dL   Albumin 3.6 3.5 - 5.0 g/dL   AST 38 15 - 41 U/L   ALT 19 17 - 63 U/L   Alkaline Phosphatase 130 (H) 38 - 126 U/L   Total Bilirubin 1.4 (H) 0.3 - 1.2 mg/dL   Bilirubin, Direct <0.1 (L) 0.1 - 0.5 mg/dL   Indirect Bilirubin NOT CALCULATED 0.3 - 0.9 mg/dL  Type and screen [facility]     Status: None   Collection Time: 11/22/15  1:01 PM  Result Value Ref Range   ABO/RH(D) O POS    Antibody  Screen NEG    Sample Expiration 11/25/2015   Culture, blood (routine x 2)     Status: None (Preliminary result)   Collection Time: 11/22/15  3:35 PM  Result Value Ref Range   Specimen Description BLOOD  RIGHT FORE ARM    Special Requests      BOTTLES DRAWN AEROBIC AND ANAEROBIC  AER 13CC ANA 12CC   Culture NO GROWTH < 24 HOURS    Report Status PENDING   Culture, blood (routine x 2)     Status: None (Preliminary result)   Collection Time: 11/22/15  3:35 PM  Result Value Ref Range   Specimen Description BLOOD  LEFT FORE ARM    Special Requests      BOTTLES DRAWN AEROBIC AND ANAEROBIC  AER [place] ANA [place]   Culture NO GROWTH < 24 HOURS    Report Status PENDING   Lactic acid, plasma     Status: Abnormal   Collection Time: 11/22/15  3:35 PM  Result  Value Ref Range   Lactic Acid, Venous 2.1 (HH) 0.5 - 1.9 mmol/L    Comment: CRITICAL RESULT CALLED TO, READ BACK BY AND VERIFIED WITH CASEY ROBERTS 11/22/15 @ 1617  MLK   Lactic acid, plasma     Status: None   Collection Time: 11/22/15  5:55 PM  Result Value Ref Range   Lactic Acid, Venous 1.9 0.5 - 1.9 mmol/L  Basic metabolic panel     Status: Abnormal   Collection Time: 11/23/15  5:07 AM  Result Value Ref Range   Sodium 135 135 - 145 mmol/L   Potassium 4.2 3.5 - 5.1 mmol/L   Chloride 98 (L) 101 - 111 mmol/L   CO2 28 22 - 32 mmol/L   Glucose, Bld 76 65 - 99 mg/dL   BUN 15 6 - 20 mg/dL   Creatinine, Ser 1.22 0.61 - 1.24 mg/dL   Calcium 9.3 8.9 - 10.3 mg/dL   GFR calc non Af Amer >60 >60 mL/min   GFR calc Af Amer >60 >60 mL/min    Comment: (NOTE) The eGFR has been calculated using the CKD EPI equation. This calculation has not been validated in all clinical situations. eGFR's persistently <60 mL/min signify possible Chronic Kidney Disease.    Anion gap 9 5 - 15  CBC     Status: Abnormal   Collection Time: 11/23/15  5:07 AM  Result Value Ref Range   WBC 5.5 3.8 - 10.6 K/uL   RBC 3.33 (L) 4.40 - 5.90 MIL/uL   Hemoglobin 10.0 (L)  13.0 - 18.0 g/dL   HCT 29.5 (L) 40.0 - 52.0 %   MCV 88.4 80.0 - 100.0 fL   MCH 29.9 26.0 - 34.0 pg   MCHC 33.8 32.0 - 36.0 g/dL   RDW 16.5 (H) 11.5 - 14.5 %   Platelets 92 (L) 150 - 440 K/uL  Glucose, capillary     Status: None   Collection Time: 11/23/15  7:21 AM  Result Value Ref Range   Glucose-Capillary 80 65 - 99 mg/dL   Dg Abd Acute W/chest  Result Date: 11/22/2015 CLINICAL DATA:  Abdomen chest pain and cough. Hypoxia. History of lung cancer and bony metastatic to these. Recent diarrhea. EXAM: DG ABDOMEN ACUTE W/ 1V CHEST COMPARISON:  CT chest 11/04/2015 FINDINGS: Stable post radiation changes in the perihilar right lung. Emphysema. No superimposed airspace disease, pleural effusion, or pneumothorax. Right IJ Port-A-Cath terminates in the superior vena cava atrial junction. Negative for free intraperitoneal air. Bowel gas pattern is within normal limits. No significant stool burden. Inferior vena cava filter projects to the right of L3. Postoperative changes of lumbar spine fusion. IMPRESSION: Stable post radiation changes of the right lung. Emphysema. No acute superimposed abnormality identified. Nonobstructive bowel gas pattern. Electronically Signed   By: Curlene Dolphin M.D.   On: 11/22/2015 16:25    Assessment:  The patient is a 55 y.o. 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  He notes recent episode(s) of emesis with subsequent hot and cold sensation.  He has a productive cough of green sputum.  Concern is raised for possible aspiration.  Plan:   1.  Oncology:  Patient with known progressive lung cancer.  He was scheduled to begin cycle #1 Taxotere on 11/22/2015.  He wishes to pursue treatment after discharge from the hospital. 2.  Hematology:  Patient on Lovenox for h/o PE and  DVT. 3.  Pulmonary:  Patient was not on oxygen prior to admission.  Concern for acute  event such as aspiration pneumonia.  Would repeat CXR or non-contrast CXR.  Sputum culture.  Patient on ceftriaxone and azithromycin. 4.  Nephrology;  Patient notes decreased oral intake prior to admission.  Continue IVF. 5.  Gastroenterology;  Continue prednisone for colitis.  No current diarrhea. 6.  Pain management:  Patient recently started on Fentanyl patch.  Oxycodone q 3-4 hours prn.  Thank you for allowing me to participate in QUINT CHESTNUT 's care.  I will follow himclosely with you while hospitalized and after discharge in the outpatient department.   Lequita Asal, MD  11/23/2015

## 2015-11-24 NOTE — Progress Notes (Signed)
  CONCERNING: Antibiotic IV to Oral Route Change Policy  RECOMMENDATION: This patient is receiving azithromycin by the intravenous route.  Based on criteria approved by the Pharmacy and Therapeutics Committee, the antibiotic(s) is/are being converted to the equivalent oral dose form(s).   DESCRIPTION: These criteria include:  Patient being treated for a respiratory tract infection, urinary tract infection, cellulitis or clostridium difficile associated diarrhea if on metronidazole  The patient is not neutropenic and does not exhibit a GI malabsorption state  The patient is eating (either orally or via tube) and/or has been taking other orally administered medications for a least 24 hours  The patient is improving clinically and has a Tmax < 100.5  If you have questions about this conversion, please contact the Pharmacy Department  '[]'$   [phone] )  [facility] '[]'$   [phone] )  [facility] '[]'$   [phone] )  [facility] '[]'$   [phone] )  [facility] '[]'$   [phone] )  [place], PharmD Clinical Pharmacist  11/24/2015 10:15 AM

## 2015-11-25 ENCOUNTER — Inpatient Hospital Stay: Payer: BLUE CROSS/BLUE SHIELD

## 2015-11-25 LAB — BASIC METABOLIC PANEL
ANION GAP: 10 (ref 5–15)
BUN: 14 mg/dL (ref 6–20)
CHLORIDE: 94 mmol/L — AB (ref 101–111)
CO2: 32 mmol/L (ref 22–32)
CREATININE: 1.19 mg/dL (ref 0.61–1.24)
Calcium: 9.5 mg/dL (ref 8.9–10.3)
GFR calc non Af Amer: >60 mL/min (ref >60)
Glucose, Bld: 164 mg/dL — ABNORMAL HIGH (ref 65–99)
POTASSIUM: 4.5 mmol/L (ref 3.5–5.1)
Sodium: 136 mmol/L (ref 135–145)

## 2015-11-25 MED ORDER — METHYLPREDNISOLONE SODIUM SUCC 40 MG IJ SOLR
40.0000 mg | Freq: Every day | INTRAMUSCULAR | Status: DC
  Administered 2015-11-26: 40 mg via INTRAVENOUS
  Filled 2015-11-25: qty 1

## 2015-11-25 MED ORDER — METHYLNALTREXONE BROMIDE 12 MG/0.6ML ~~LOC~~ SOLN
12.0000 mg | Freq: Once | SUBCUTANEOUS | Status: DC
  Filled 2015-11-25: qty 0.6

## 2015-11-25 NOTE — Progress Notes (Signed)
Pt complaining of bilateral feet swelling/ Dr. Benjie Karvonen paged to make aware/ no new orders/ will continue to monitor

## 2015-11-25 NOTE — Progress Notes (Addendum)
[facility] [place] NAME: Jaquari Reckner    MR#:  [id number]  DATE OF BIRTH:  1960-02-03  SUBJECTIVE:   Patient With much improved symptoms morning. Shortness of breath, cough have improved. Complaining of right shoulder pain  REVIEW OF SYSTEMS:    Review of Systems  Constitutional: Negative for chills, fever and malaise/fatigue.  HENT: Negative.  Negative for ear discharge, ear pain, hearing loss, nosebleeds and sore throat.   Eyes: Negative.  Negative for blurred vision and pain.  Respiratory: Positive for cough and shortness of breath. Negative for hemoptysis and wheezing.   Cardiovascular: Negative.  Negative for chest pain, palpitations and leg swelling.  Gastrointestinal: Negative.  Negative for abdominal pain, blood in stool, diarrhea, nausea and vomiting.  Genitourinary: Negative.  Negative for dysuria.  Musculoskeletal: Negative for back pain.       Right shoulder pain  Skin: Negative.   Neurological: Negative for dizziness, tremors, speech change, focal weakness, seizures, weakness and headaches.  Endo/Heme/Allergies: Negative.  Does not bruise/bleed easily.  Psychiatric/Behavioral: Negative.  Negative for depression, hallucinations and suicidal ideas.    Tolerating Diet:yes      DRUG ALLERGIES:   Allergies  Allergen Reactions  . No Known Allergies     VITALS:  Blood pressure 138/72, pulse (!) 108, temperature 97.9 F (36.6 C), temperature source Oral, resp. rate 20, height '5\' 8"'$  (1.727 m), weight 62.5 kg (137 lb 11.2 oz), SpO2 93 %.  PHYSICAL EXAMINATION:   Physical Exam  Constitutional: He is oriented to person, place, and time and well-developed, well-nourished, and in no distress. No distress.  HENT:  Head: Normocephalic.  Eyes: No scleral icterus.  Neck: Normal range of motion. Neck supple. No JVD present. No tracheal deviation present.  Cardiovascular: Normal rate, regular rhythm and normal heart sounds.   Exam reveals no gallop and no friction rub.   No murmur heard. Pulmonary/Chest: Effort normal. No respiratory distress. He has no wheezes. He has no rales. He exhibits no tenderness.  Rhonchi audible right side without crackles or rails  Abdominal: Soft. Bowel sounds are normal. He exhibits no distension and no mass. There is no tenderness. There is no rebound and no guarding.  Musculoskeletal: Normal range of motion. He exhibits no edema.  Neurological: He is alert and oriented to person, place, and time.  Skin: Skin is warm. No rash noted. No erythema.  Psychiatric: Affect and judgment normal.      LABORATORY PANEL:   CBC  Recent Labs Lab 11/23/15 0507  WBC 5.5  HGB 10.0*  HCT 29.5*  PLT 92*   ------------------------------------------------------------------------------------------------------------------  Chemistries   Recent Labs Lab 11/22/15 1240  11/25/15 0429  NA 134*  < > 136  K 4.3  < > 4.5  CL 91*  < > 94*  CO2 27  < > 32  GLUCOSE 84  < > 164*  BUN 22*  < > 14  CREATININE 1.51*  < > 1.19  CALCIUM 10.1  < > 9.5  AST 38  --   --   ALT 19  --   --   ALKPHOS 130*  --   --   BILITOT 1.4*  --   --   < > = values in this interval not displayed. ------------------------------------------------------------------------------------------------------------------  Cardiac Enzymes No results for input(s): TROPONINI in the last 168 hours. ------------------------------------------------------------------------------------------------------------------  RADIOLOGY:  Dg Chest 1 View  Result Date: 11/25/2015 CLINICAL DATA:  COPD. Congestive heart failure. History of  right lung carcinoma. EXAM: CHEST 1 VIEW COMPARISON:  11/24/2015 and older studies. FINDINGS: Right hilar prominence with linear opacity extending from the right hilum is stable consistent with post radiation related scarring. Lungs are mildly hyperexpanded. There are mildly prominent interstitial lung  markings bilaterally. Additional mild scarring is noted in the left upper lobe, also stable. No evidence of pneumonia or pulmonary edema. No pleural effusion or pneumothorax. Cardiac silhouette is normal size. No mediastinal or left hilar masses. Right internal jugular Port-A-Cath is stable and well positioned. IMPRESSION: 1. No acute findings. 2. Chronic changes from right lung carcinoma treatment.  COPD. Electronically Signed   By: Lajean Manes M.D.   On: 11/25/2015 07:35   Dg Chest 1 View  Result Date: 11/24/2015 CLINICAL DATA:  Chest pain. Hypoxia. Cough. Known right-sided lung cancer. EXAM: CHEST 1 VIEW COMPARISON:  November 22, 2015 FINDINGS: Postradiation changes in the right hilar region are stable. No pneumothorax. The right Port-A-Cath is stable. Mild pulmonary venous congestion. No other acute abnormalities. IMPRESSION: Mild pulmonary venous congestion.  No other interval changes. Electronically Signed   By: Dorise Bullion III M.D   On: 11/24/2015 08:34     ASSESSMENT AND PLAN:   55 year old male with a history of progressive lung cancer who presents with shortness of breath   1. Acute hypoxic respiratory failure due to progression of underlying lung cancer with pneumonia. Chest x-ray this morning shows Resolving pneumonia  Recent echocardiogram 10/17 shows normal ejection fraction without valvular abnormalities or diastolic dysfunction. Continue Rocephin and azithromycin. Continue to try to wean oxygen.  Blood cultures negative Speech evaluation to evaluate for aspiration as per recommendations by oncology He will need oxygen at discharge  2. Acute kidney injury: This is due to prerenal azotemia and has improved with IV fluids.  3. 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 Continue fentanyl  Patient wishes to pursue treatment after discharge from hospital.  4. Depression: Continue  Cymbalta  5. Hypothyroidism: Continue Synthroid  6. Essential hypertension: Continue metoprolol  7. Acute COPD exacerbation: Wheezing is improved with IV steroids. Wean steroids Continue inhalers and DuoNeb's   8. HX PE: Continue LOVENOX  9. Constipation:Relistor ordered  Physical therapy is recommending home with home health care  Management plans discussed with the patient and wife and he is in agreement.  CODE STATUS: full  TOTAL TIME TAKING CARE OF THIS PATIENT: 28 minutes.     POSSIBLE D/C tomorrow, DEPENDING ON CLINICAL CONDITION.   Domitila Stetler M.D on 11/25/2015 at 8:41 AM  Between 7am to 6pm - Pager - [phone] After 6pm go to [email or web address] - password [email or web address] [facility]  Office  [phone]  CC: Primary care physician; Marinda Elk, MD  Note: This dictation was prepared with Dragon dictation along with smaller phrase technology. Any transcriptional errors that result from this process are unintentional.

## 2015-11-25 NOTE — Evaluation (Signed)
Clinical/Bedside Swallow Evaluation Patient Details  Name: Darryl Hatfield MRN: [id number] Date of Birth: May 16, 1960  Today's Date: 11/25/2015 Time: SLP Start Time (ACUTE ONLY): 1003 SLP Stop Time (ACUTE ONLY): 1020 SLP Time Calculation (min) (ACUTE ONLY): 17 min  Past Medical History:  Past Medical History:  Diagnosis Date  . CHF (congestive heart failure) ([place])   . COPD (chronic obstructive pulmonary disease) ([place])   . Hypothyroidism   . Lung cancer ([place]) 2015   RIght   . Seizures ([place])    Past Surgical History:  Past Surgical History:  Procedure Laterality Date  . BACK SURGERY    . COLONOSCOPY  10/2015  . PORTACATH PLACEMENT Right    HPI:      Assessment / Plan / Recommendation Clinical Impression  pt presents with no oral pharyngeal dysphagia noted. pt mastication appeared adequate as well as a-p transit. pt had cough pre and post intake of trials however did not appear to be diet related. pt does have dx pna. No overt ssx aspiration noted. Diet recommended to remain teh same. SLP educated nursing to report any changes in status or swallow function.     Aspiration Risk  No limitations    Diet Recommendation Regular;Thin liquid   Liquid Administration via: Cup;Straw Medication Administration: Whole meds with liquid Supervision: Patient able to self feed Compensations: Slow rate;Small sips/bites;Follow solids with liquid Postural Changes: Seated upright at 90 degrees    Other  Recommendations Oral Care Recommendations: Patient independent with oral care   Follow up Recommendations None      Frequency and Duration            Prognosis Prognosis for Safe Diet Advancement: Good      Swallow Study   General Date of Onset: 11/25/15 Type of Study: Bedside Swallow Evaluation Diet Prior to this Study: Regular;Thin liquids Temperature Spikes Noted: No Respiratory Status: Room air History of Recent Intubation: No Behavior/Cognition:  Alert;Cooperative;Agitated Oral Cavity Assessment: Within Functional Limits Oral Care Completed by SLP: No Oral Cavity - Dentition: Adequate natural dentition Vision: Functional for self-feeding Self-Feeding Abilities: Able to feed self Patient Positioning: Upright in bed Baseline Vocal Quality: Normal Volitional Cough: Strong Volitional Swallow: Able to elicit    Oral/Motor/Sensory Function Overall Oral Motor/Sensory Function: Within functional limits   Ice Chips Ice chips: Within functional limits Presentation: Spoon;Self Fed   Thin Liquid Thin Liquid: Within functional limits Presentation: Cup;Straw;Self Fed    Nectar Thick Nectar Thick Liquid: Not tested   Honey Thick Honey Thick Liquid: Not tested   Puree Puree: Within functional limits Presentation: Self Fed;Spoon   Solid   GO   Presentation: Self Fed;Spoon        [place] Sauber 11/25/2015,11:25 AM

## 2015-11-26 LAB — GLUCOSE, CAPILLARY: GLUCOSE-CAPILLARY: 83 mg/dL (ref 65–99)

## 2015-11-26 MED ORDER — PREDNISONE 10 MG PO TABS: 10.0000 mg | ORAL_TABLET | Freq: Every day | ORAL | 0 refills | Status: DC

## 2015-11-26 MED ORDER — LEVOTHYROXINE SODIUM 88 MCG PO TABS: 44.0000 ug | ORAL_TABLET | Freq: Every day | ORAL | 0 refills | Status: DC

## 2015-11-26 MED ORDER — MOMETASONE FURO-FORMOTEROL FUM 100-5 MCG/ACT IN AERO: 2.0000 | INHALATION_SPRAY | Freq: Two times a day (BID) | RESPIRATORY_TRACT | 0 refills | Status: AC

## 2015-11-26 MED ORDER — AZITHROMYCIN 250 MG PO TABS: ORAL_TABLET | ORAL | 0 refills | Status: DC

## 2015-11-26 NOTE — Progress Notes (Signed)
Discharge instructions along with home medication list and follow up gone over with patient and family. Both verbalized that they understood instructions. Printed rx given to patient. Patient also made aware that 3 prescriptions were electronically submitted to pharmacy. Patient waiting on 02 and walker to be delivered. Will continue to monitor

## 2015-11-26 NOTE — Care Management Note (Signed)
Case Management Note  Patient Details  Name: Darryl Hatfield MRN: [id number] Date of Birth: 1960-09-17  Subjective/Objective:    Per patient choice, a referral for home health RN, PT and Aide was faxed to [facility]. A request for new home oxygen and a RW was faxed to [facility] with a request for a portable oxygen tank to be delivered to [facility] room 242 today for [name].                 Action/Plan:   Expected Discharge Date:                  Expected Discharge Plan:     In-House Referral:     Discharge planning Services     Post Acute Care Choice:    Choice offered to:     DME Arranged:    DME Agency:     HH Arranged:    HH Agency:     Status of Service:     If discussed at [employer] of Stay Meetings, dates discussed:    Additional Comments:  Keandre Linden A, RN 11/26/2015, 11:40 AM

## 2015-11-26 NOTE — Discharge Summary (Signed)
[facility] [place] NAME: Darryl Hatfield    MR#:  [id number]  DATE OF BIRTH:  November 10, 1960  DATE OF ADMISSION:  11/22/2015 ADMITTING PHYSICIAN: Epifanio Lesches, MD  DATE OF DISCHARGE: 11/26/2015  PRIMARY CARE PHYSICIAN: Marinda Elk, MD    ADMISSION DIAGNOSIS:  Shortness of breath [R06.02] Epigastric pain [R10.13] Atypical chest pain [R07.89] Hypoxia [R09.02]  DISCHARGE DIAGNOSIS:  Active Problems:   Acute respiratory failure with hypoxemia ([place])   SECONDARY DIAGNOSIS:   Past Medical History:  Diagnosis Date  . CHF (congestive heart failure) ([place])   . COPD (chronic obstructive pulmonary disease) ([place])   . Hypothyroidism   . Lung cancer ([place]) 2015   RIght   . Seizures [facility])     HOSPITAL COURSE:   55 year old male with a history of progressive lung cancer who presents with shortness of breath   1. Acute hypoxic respiratory failure due to progression of underlying lung cancer with pneumonia. He was initiated on Rocephin and azithromycin and will be discharged with by mouth azithromycin for 6 more days. Oxygen has been weaned to 2 L. He will have a prescription for oxygen at discharge. He will need close follow-up with his primary care physician and oncologist. His blood cultures were negative. He was evaluated by speech without evidence of aspiration..  2. Acute kidney injury: This is due to prerenal azotemia and has improved with IV fluids.  3. 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  4. Depression: Continue Cymbalta  5. Hypothyroidism: Continue Synthroid  6. Essential hypertension: Continue metoprolol  7. Acute COPD exacerbation: Patient will be discharged with oral steroid taper in addition to azithromycin,  inhalers.  8. HX PE: Continue LOVENOX  9. Constipation:Relistor was ordered. Patient refused stool softeners, enema or suppository. He states that he will not have a bowel movement in the hospital. Patient's abdominal exam is benign and soft at discharge.  Physical therapy is recommending home with home health care   DISCHARGE CONDITIONS AND DIET:   Stable for discharge on heart healthy diet  CONSULTS OBTAINED:  Treatment Team:  Lequita Asal, MD  DRUG ALLERGIES:   Allergies  Allergen Reactions  . No Known Allergies     DISCHARGE MEDICATIONS:   Current Discharge Medication List    START taking these medications   Details  azithromycin (ZITHROMAX) 250 MG tablet 1 tablet daily for 6 days Qty: 6 each, Refills: 0    mometasone-formoterol (DULERA) 100-5 MCG/ACT AERO Inhale 2 puffs into the lungs 2 (two) times daily. Qty: 0.1 g, Refills: 0      CONTINUE these medications which have CHANGED   Details  levothyroxine (SYNTHROID, LEVOTHROID) 88 MCG tablet Take 0.5 tablets (44 mcg total) by mouth daily before breakfast. Qty: 30 tablet, Refills: 0    predniSONE (DELTASONE) 10 MG tablet Take 1 tablet (10 mg total) by mouth daily with breakfast. 60 mg PO (ORAL) x 2 days 50 mg PO (ORAL)  x 2 days 40 mg PO (ORAL)  x 2 days 30 mg PO  (ORAL)  x 2 days 20 mg PO  (ORAL) x 2 days 10 mg PO  (ORAL) x 2 days then stop Qty: 42 tablet, Refills: 0      CONTINUE these medications which have NOT CHANGED   Details  acetaminophen (TYLENOL) 325 MG tablet Take 650 mg by mouth every 6 (six) hours as needed for mild pain.     albuterol (PROVENTIL) (2.5 MG/3ML) 0.083% nebulizer solution Take 3 mLs (2.5 mg total) by nebulization every 6 (six) hours as needed for wheezing or shortness of breath. Qty: 75 mL, Refills: 2    aspirin 81 MG EC tablet Take 81 mg by mouth daily.  Refills: 0    atorvastatin (LIPITOR) 80 MG tablet Take 80 mg by mouth daily.     Calcium Carbonate-Vitamin D  600-400 MG-UNIT tablet Take 1 tablet by mouth 2 (two) times daily.     dexamethasone (DECADRON) 4 MG tablet Take 8 mg (2 pills) twice a day on the day before Taxotere and on the day after Taxotere. Qty: 32 tablet, Refills: 0   Associated Diagnoses: Adenocarcinoma of lung, stage 4, unspecified laterality (HCC)    DULoxetine (CYMBALTA) 30 MG capsule Take 60 mg by mouth daily.     enoxaparin (LOVENOX) 60 MG/0.6ML injection Inject 0.6 mLs (60 mg total) into the skin every 12 (twelve) hours. Qty: 60 Syringe, Refills: 0    fentaNYL (DURAGESIC - DOSED MCG/HR) 12 MCG/HR Place 1 patch (12.5 mcg total) onto the skin every 3 (three) days. Qty: 5 patch, Refills: 0    folic acid (FOLVITE) 1 MG tablet TAKE 1 TABLET BY MOUTH DAILY START 5-7 DAYS BEFORE ALIMTA CHEMO, CONTINUE UNTIL 21 DAYS AFTER ALIMTA Qty: 100 tablet, Refills: 3   Associated Diagnoses: Adenocarcinoma of lung, stage 4, unspecified laterality (HCC)    gabapentin (NEURONTIN) 100 MG capsule Take 1 capsule (100 mg total) by mouth 3 (three) times daily. Qty: 90 capsule, Refills: 4   Associated Diagnoses: Sciatica, unspecified laterality    hydrOXYzine (ATARAX/VISTARIL) 10 MG tablet Take 1 tablet (10 mg total) by mouth 3 (three) times daily as needed for itching. Qty: 60 tablet, Refills: 1    LORazepam (ATIVAN) 0.5 MG tablet Take 1 tablet (0.5 mg total) by mouth every 8 (eight) hours as needed for anxiety. Qty: 30 tablet, Refills: 0    metoCLOPramide (REGLAN) 10 MG tablet Take 1 tablet (10 mg total) by mouth every 6 (six) hours as needed. Qty: 30 tablet, Refills: 0    metoprolol succinate (TOPROL-XL) 25 MG 24 hr tablet Take 25 mg by mouth 2 (two) times daily.  Refills: 2    nitroGLYCERIN (NITROSTAT) 0.4 MG SL tablet Place 0.4 mg under the tongue every 5 (five) minutes as needed.  Refills: 0    ondansetron (ZOFRAN) 8 MG tablet Take 1 tablet (8 mg total) by mouth every 8 (eight) hours as needed for nausea or vomiting. 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     QUEtiapine (SEROQUEL) 50 MG tablet Take 50 mg by mouth daily.     valACYclovir (VALTREX) 500 MG tablet Take 500 mg by mouth daily.       STOP taking these medications     traMADol-acetaminophen (ULTRACET) 37.5-325 MG tablet  Today   CHIEF COMPLAINT:   Patient doing better this morning. Decrease shortness of breath and wheezing   VITAL SIGNS:  Blood pressure (!) 141/84, pulse (!) 106, temperature 99.3 F (37.4 C), temperature source Oral, resp. rate (!) 22, height '5\' 8"'$  (1.727 m), weight 62.1 kg (136 lb 14.4 oz), SpO2 93 %.   REVIEW OF SYSTEMS:  Review of Systems  Constitutional: Negative.  Negative for chills, fever and malaise/fatigue.  HENT: Negative.  Negative for ear discharge, ear pain, hearing loss, nosebleeds and sore throat.   Eyes: Negative.  Negative for blurred vision and pain.  Respiratory: Positive for wheezing. Negative for cough, hemoptysis and shortness of breath.   Cardiovascular: Negative.  Negative for chest pain, palpitations and leg swelling.  Gastrointestinal: Positive for constipation. Negative for abdominal pain, blood in stool, diarrhea, nausea and vomiting.  Genitourinary: Negative.  Negative for dysuria.  Musculoskeletal: Negative.  Negative for back pain.  Skin: Negative.   Neurological: Negative for dizziness, tremors, speech change, focal weakness, seizures and headaches.  Endo/Heme/Allergies: Negative.  Does not bruise/bleed easily.   Psychiatric/Behavioral: Negative.  Negative for depression, hallucinations and suicidal ideas.     PHYSICAL EXAMINATION:  GENERAL:  55 y.o.-year-old patient lying in the bed with no acute distress.  NECK:  Supple, no jugular venous distention. No thyroid enlargement, no tenderness.  LUNGS: Patient with wheezing on the right upper lobe with crackles no rales,rhonchi  No use of accessory muscles of respiration.  CARDIOVASCULAR: S1, S2 normal. No murmurs, rubs, or gallops.  ABDOMEN: Soft, non-tender, non-distended. Bowel sounds present. No organomegaly or mass.  EXTREMITIES: No pedal edema, cyanosis, or clubbing.  PSYCHIATRIC: The patient is alert and oriented x 3.  SKIN: No obvious rash, lesion, or ulcer.   DATA REVIEW:   CBC  Recent Labs Lab 11/23/15 0507  WBC 5.5  HGB 10.0*  HCT 29.5*  PLT 92*    Chemistries   Recent Labs Lab 11/22/15 1240  11/25/15 0429  NA 134*  < > 136  K 4.3  < > 4.5  CL 91*  < > 94*  CO2 27  < > 32  GLUCOSE 84  < > 164*  BUN 22*  < > 14  CREATININE 1.51*  < > 1.19  CALCIUM 10.1  < > 9.5  AST 38  --   --   ALT 19  --   --   ALKPHOS 130*  --   --   BILITOT 1.4*  --   --   < > = values in this interval not displayed.  Cardiac Enzymes No results for input(s): TROPONINI in the last 168 hours.  Microbiology Results  '@[id number]'$ @  RADIOLOGY:  Dg Chest 1 View  Result Date: 11/25/2015 CLINICAL DATA:  COPD. Congestive heart failure. History of right lung carcinoma. EXAM: CHEST 1 VIEW COMPARISON:  11/24/2015 and older studies. FINDINGS: Right hilar prominence with linear opacity extending from the right hilum is stable consistent with post radiation related scarring. Lungs are mildly hyperexpanded. There are mildly prominent interstitial lung markings bilaterally. Additional mild scarring is noted in the left upper lobe, also stable. No evidence of pneumonia or pulmonary edema. No pleural effusion or pneumothorax. Cardiac silhouette is normal  size. No mediastinal or left hilar masses. Right internal jugular Port-A-Cath is stable and well positioned. IMPRESSION: 1. No acute findings. 2. Chronic changes from right lung carcinoma treatment.  COPD. Electronically Signed   By: Lajean Manes M.D.   On: 11/25/2015 07:35  Dg Shoulder Right  Result Date: 11/25/2015 CLINICAL DATA:  Seizure.  Right shoulder pain after injury. EXAM: RIGHT SHOULDER - 2+ VIEW COMPARISON:  None. FINDINGS: Partially visualized right internal jugular MediPort entering the lower superior vena cava with the tip not seen on these images. There is healed deformity in the right proximal humerus. No dislocation at the glenohumeral joint. Right acromioclavicular joint appears intact. No acute fracture. No suspicious focal osseous lesion. 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 No pathologic soft tissue densities. Bandlike opacity in the parahilar upper right lung. IMPRESSION: 1. No right shoulder fracture or dislocation. Healed deformity in the proximal right humerus. 2. 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 3. Bandlike parahilar upper right lung opacity, correlate with chest radiograph from earlier today. Electronically Signed   By: Ilona Sorrel M.D.   On: 11/25/2015 14:29      Management plans discussed with the patient and he is in agreement. Stable for [facility]  Patient should follow up with pcp  CODE STATUS:     Code Status Orders        Start     Ordered   11/22/15 1648  Full code  Continuous     11/22/15 1647    Code Status History    Date Active Date Inactive Code Status Order ID Comments User Context   11/04/2015 11:40 AM 11/06/2015  3:15 AM Full Code [id number]  Idelle Crouch, MD Inpatient   12/25/2014  4:39 AM 12/26/2014  6:04 PM Full Code [id number]  Harrie Foreman, MD Inpatient    Advance Directive Documentation   Flowsheet Row Most Recent Value  Type of Advance Directive  Healthcare  Power of Attorney  Pre-existing out of facility DNR order (yellow form or pink MOST form)  No data  "MOST" Form in Place?  No data      TOTAL TIME TAKING CARE OF THIS PATIENT: 35 minutes.    Note: This dictation was prepared with Dragon dictation along with smaller phrase technology. Any transcriptional errors that result from this process are unintentional.  Terilyn Sano M.D on 11/26/2015 at 10:33 AM  Between 7am to 6pm - Pager - [phone] After 6pm go to [email or web address] - password [email or web address] [facility]  Office  [phone]  CC: Primary care physician; Marinda Elk, MD

## 2015-11-26 NOTE — Progress Notes (Addendum)
SATURATION QUALIFICATIONS: (This note is used to comply with regulatory documentation for home oxygen)  Patient Saturations on Room Air at Rest = 86%  Patient Saturations on Room Air while Ambulating = 85%  Patient Saturations on 2 Liters of oxygen while Ambulating = 94%  Please briefly explain why patient needs home oxygen:   COPD, right sided lung cancer, CHF  02 Sat measurements obtained by [employer].

## 2015-11-26 NOTE — Progress Notes (Signed)
Personal o2 tank delivered to patients room. Iv and telemetry removed. Ride on their way. Patient to be discharged home on 02 at 2l

## 2015-11-26 NOTE — Progress Notes (Signed)
Pt. Slept throughout the night with c/o pain to right shoulder x3, medicated each time, with some relief. Pt. Does get dyspneic with exertion. Uses urinal at bedside. Continues to be A&O x4. Continues O2. Ran NSR throughout the night.

## 2015-11-27 ENCOUNTER — Inpatient Hospital Stay
Admission: EM | Admit: 2015-11-27 | Discharge: 2015-12-05 | DRG: 189 | Disposition: A | Discharge status: Hospice | Payer: BLUE CROSS/BLUE SHIELD | Attending: Internal Medicine | Admitting: Internal Medicine

## 2015-11-27 ENCOUNTER — Emergency Department: Payer: BLUE CROSS/BLUE SHIELD

## 2015-11-27 ENCOUNTER — Other Ambulatory Visit: Payer: Self-pay

## 2015-11-27 ENCOUNTER — Encounter: Payer: Self-pay | Admitting: Emergency Medicine

## 2015-11-27 DIAGNOSIS — Z809 Family history of malignant neoplasm, unspecified: Secondary | ICD-10-CM

## 2015-11-27 DIAGNOSIS — E785 Hyperlipidemia, unspecified: Secondary | ICD-10-CM | POA: Diagnosis present

## 2015-11-27 DIAGNOSIS — I252 Old myocardial infarction: Secondary | ICD-10-CM | POA: Diagnosis not present

## 2015-11-27 DIAGNOSIS — C3411 Malignant neoplasm of upper lobe, right bronchus or lung: Secondary | ICD-10-CM | POA: Diagnosis not present

## 2015-11-27 DIAGNOSIS — Z515 Encounter for palliative care: Secondary | ICD-10-CM | POA: Diagnosis not present

## 2015-11-27 DIAGNOSIS — Z9981 Dependence on supplemental oxygen: Secondary | ICD-10-CM | POA: Diagnosis not present

## 2015-11-27 DIAGNOSIS — R0602 Shortness of breath: Secondary | ICD-10-CM | POA: Diagnosis present

## 2015-11-27 DIAGNOSIS — C7972 Secondary malignant neoplasm of left adrenal gland: Secondary | ICD-10-CM | POA: Diagnosis not present

## 2015-11-27 DIAGNOSIS — C7800 Secondary malignant neoplasm of unspecified lung: Secondary | ICD-10-CM | POA: Diagnosis not present

## 2015-11-27 DIAGNOSIS — R042 Hemoptysis: Secondary | ICD-10-CM | POA: Diagnosis not present

## 2015-11-27 DIAGNOSIS — Z87891 Personal history of nicotine dependence: Secondary | ICD-10-CM | POA: Diagnosis not present

## 2015-11-27 DIAGNOSIS — R040 Epistaxis: Secondary | ICD-10-CM | POA: Diagnosis present

## 2015-11-27 DIAGNOSIS — Z86711 Personal history of pulmonary embolism: Secondary | ICD-10-CM | POA: Diagnosis not present

## 2015-11-27 DIAGNOSIS — C3490 Malignant neoplasm of unspecified part of unspecified bronchus or lung: Secondary | ICD-10-CM | POA: Diagnosis not present

## 2015-11-27 DIAGNOSIS — J9621 Acute and chronic respiratory failure with hypoxia: Secondary | ICD-10-CM | POA: Diagnosis not present

## 2015-11-27 DIAGNOSIS — J9601 Acute respiratory failure with hypoxia: Secondary | ICD-10-CM | POA: Diagnosis present

## 2015-11-27 DIAGNOSIS — Z825 Family history of asthma and other chronic lower respiratory diseases: Secondary | ICD-10-CM

## 2015-11-27 DIAGNOSIS — I5032 Chronic diastolic (congestive) heart failure: Secondary | ICD-10-CM | POA: Diagnosis present

## 2015-11-27 DIAGNOSIS — E039 Hypothyroidism, unspecified: Secondary | ICD-10-CM | POA: Diagnosis present

## 2015-11-27 DIAGNOSIS — Z79899 Other long term (current) drug therapy: Secondary | ICD-10-CM

## 2015-11-27 DIAGNOSIS — J441 Chronic obstructive pulmonary disease with (acute) exacerbation: Secondary | ICD-10-CM | POA: Diagnosis present

## 2015-11-27 DIAGNOSIS — Z7982 Long term (current) use of aspirin: Secondary | ICD-10-CM

## 2015-11-27 DIAGNOSIS — C7971 Secondary malignant neoplasm of right adrenal gland: Secondary | ICD-10-CM | POA: Diagnosis present

## 2015-11-27 DIAGNOSIS — C3491 Malignant neoplasm of unspecified part of right bronchus or lung: Secondary | ICD-10-CM | POA: Diagnosis present

## 2015-11-27 DIAGNOSIS — M6281 Muscle weakness (generalized): Secondary | ICD-10-CM

## 2015-11-27 DIAGNOSIS — K5900 Constipation, unspecified: Secondary | ICD-10-CM | POA: Diagnosis present

## 2015-11-27 DIAGNOSIS — Z86718 Personal history of other venous thrombosis and embolism: Secondary | ICD-10-CM | POA: Diagnosis not present

## 2015-11-27 DIAGNOSIS — I2510 Atherosclerotic heart disease of native coronary artery without angina pectoris: Secondary | ICD-10-CM | POA: Diagnosis present

## 2015-11-27 DIAGNOSIS — K529 Noninfective gastroenteritis and colitis, unspecified: Secondary | ICD-10-CM | POA: Diagnosis present

## 2015-11-27 DIAGNOSIS — Z66 Do not resuscitate: Secondary | ICD-10-CM | POA: Diagnosis not present

## 2015-11-27 DIAGNOSIS — R109 Unspecified abdominal pain: Secondary | ICD-10-CM

## 2015-11-27 DIAGNOSIS — C778 Secondary and unspecified malignant neoplasm of lymph nodes of multiple regions: Secondary | ICD-10-CM | POA: Diagnosis not present

## 2015-11-27 DIAGNOSIS — R590 Localized enlarged lymph nodes: Secondary | ICD-10-CM | POA: Diagnosis present

## 2015-11-27 DIAGNOSIS — F411 Generalized anxiety disorder: Secondary | ICD-10-CM

## 2015-11-27 DIAGNOSIS — Z7189 Other specified counseling: Secondary | ICD-10-CM | POA: Diagnosis not present

## 2015-11-27 DIAGNOSIS — F418 Other specified anxiety disorders: Secondary | ICD-10-CM | POA: Diagnosis present

## 2015-11-27 DIAGNOSIS — Z923 Personal history of irradiation: Secondary | ICD-10-CM | POA: Diagnosis not present

## 2015-11-27 DIAGNOSIS — R262 Difficulty in walking, not elsewhere classified: Secondary | ICD-10-CM

## 2015-11-27 DIAGNOSIS — D65 Disseminated intravascular coagulation [defibrination syndrome]: Secondary | ICD-10-CM | POA: Diagnosis present

## 2015-11-27 DIAGNOSIS — R0789 Other chest pain: Secondary | ICD-10-CM | POA: Diagnosis present

## 2015-11-27 DIAGNOSIS — Z8249 Family history of ischemic heart disease and other diseases of the circulatory system: Secondary | ICD-10-CM

## 2015-11-27 LAB — CULTURE, BLOOD (ROUTINE X 2)
CULTURE: NO GROWTH
CULTURE: NO GROWTH

## 2015-11-27 LAB — BASIC METABOLIC PANEL
Anion gap: 11 (ref 5–15)
BUN: 21 mg/dL — ABNORMAL HIGH (ref 6–20)
CHLORIDE: 93 mmol/L — AB (ref 101–111)
CO2: 32 mmol/L (ref 22–32)
CREATININE: 1.09 mg/dL (ref 0.61–1.24)
Calcium: 10.2 mg/dL (ref 8.9–10.3)
GFR calc non Af Amer: >60 mL/min (ref >60)
Glucose, Bld: 81 mg/dL (ref 65–99)
POTASSIUM: 4.2 mmol/L (ref 3.5–5.1)
SODIUM: 136 mmol/L (ref 135–145)

## 2015-11-27 LAB — CBC
HCT: 31.7 % — ABNORMAL LOW (ref 40.0–52.0)
Hemoglobin: 10.5 g/dL — ABNORMAL LOW (ref 13.0–18.0)
MCH: 29.1 pg (ref 26.0–34.0)
MCHC: 33 g/dL (ref 32.0–36.0)
MCV: 88.2 fL (ref 80.0–100.0)
PLATELETS: 119 10*3/uL — AB (ref 150–440)
RBC: 3.59 MIL/uL — AB (ref 4.40–5.90)
RDW: 16.7 % — ABNORMAL HIGH (ref 11.5–14.5)
WBC: 8.4 10*3/uL (ref 3.8–10.6)

## 2015-11-27 LAB — TROPONIN I: Troponin I: <0.03 ng/mL (ref <0.03)

## 2015-11-27 MED ORDER — METOPROLOL SUCCINATE ER 25 MG PO TB24
25.0000 mg | ORAL_TABLET | Freq: Two times a day (BID) | ORAL | Status: DC
  Administered 2015-11-27 – 2015-12-02 (×11): 25 mg via ORAL
  Filled 2015-11-27 (×11): qty 1

## 2015-11-27 MED ORDER — HYDROXYZINE HCL 10 MG PO TABS
10.0000 mg | ORAL_TABLET | Freq: Once | ORAL | Status: AC
  Administered 2015-11-27: 10 mg via ORAL
  Filled 2015-11-27: qty 1

## 2015-11-27 MED ORDER — SODIUM CHLORIDE 0.9 % IV SOLN: 250.0000 mL | INTRAVENOUS | Status: DC | PRN

## 2015-11-27 MED ORDER — INFLUENZA VAC SPLIT QUAD 0.5 ML IM SUSY: 0.5000 mL | PREFILLED_SYRINGE | INTRAMUSCULAR | Status: DC

## 2015-11-27 MED ORDER — IPRATROPIUM-ALBUTEROL 0.5-2.5 (3) MG/3ML IN SOLN
3.0000 mL | Freq: Once | RESPIRATORY_TRACT | Status: AC
  Administered 2015-11-27: 3 mL via RESPIRATORY_TRACT
  Filled 2015-11-27: qty 3

## 2015-11-27 MED ORDER — SODIUM CHLORIDE 0.9% FLUSH
3.0000 mL | Freq: Two times a day (BID) | INTRAVENOUS | Status: DC
  Administered 2015-11-27 – 2015-12-05 (×16): 3 mL via INTRAVENOUS

## 2015-11-27 MED ORDER — OXYCODONE HCL 5 MG PO TABS
5.0000 mg | ORAL_TABLET | ORAL | Status: DC | PRN
  Administered 2015-11-27 – 2015-11-29 (×7): 5 mg via ORAL
  Filled 2015-11-27 (×7): qty 1

## 2015-11-27 MED ORDER — ASPIRIN EC 81 MG PO TBEC
81.0000 mg | DELAYED_RELEASE_TABLET | Freq: Every day | ORAL | Status: DC
  Administered 2015-11-27 – 2015-12-05 (×9): 81 mg via ORAL
  Filled 2015-11-27 (×9): qty 1

## 2015-11-27 MED ORDER — MOMETASONE FURO-FORMOTEROL FUM 100-5 MCG/ACT IN AERO
2.0000 | INHALATION_SPRAY | Freq: Two times a day (BID) | RESPIRATORY_TRACT | Status: DC
  Administered 2015-11-27 – 2015-12-05 (×16): 2 via RESPIRATORY_TRACT
  Filled 2015-11-27: qty 8.8

## 2015-11-27 MED ORDER — MORPHINE SULFATE (PF) 2 MG/ML IV SOLN
4.0000 mg | Freq: Once | INTRAVENOUS | Status: AC
  Administered 2015-11-27: 4 mg via INTRAVENOUS
  Filled 2015-11-27: qty 2

## 2015-11-27 MED ORDER — IOPAMIDOL (ISOVUE-370) INJECTION 76%
75.0000 mL | Freq: Once | INTRAVENOUS | Status: AC | PRN
  Administered 2015-11-27: 75 mL via INTRAVENOUS

## 2015-11-27 MED ORDER — ACETAMINOPHEN 650 MG RE SUPP: 650.0000 mg | Freq: Four times a day (QID) | RECTAL | Status: DC | PRN

## 2015-11-27 MED ORDER — MORPHINE SULFATE (PF) 2 MG/ML IV SOLN
4.0000 mg | Freq: Once | INTRAVENOUS | Status: AC
  Administered 2015-11-27: 4 mg via INTRAVENOUS

## 2015-11-27 MED ORDER — MORPHINE SULFATE (PF) 2 MG/ML IV SOLN
INTRAVENOUS | Status: AC
  Administered 2015-11-27: 4 mg via INTRAVENOUS
  Filled 2015-11-27: qty 2

## 2015-11-27 MED ORDER — PANTOPRAZOLE SODIUM 40 MG PO TBEC
40.0000 mg | DELAYED_RELEASE_TABLET | Freq: Every day | ORAL | Status: DC
  Administered 2015-11-27 – 2015-12-05 (×9): 40 mg via ORAL
  Filled 2015-11-27 (×9): qty 1

## 2015-11-27 MED ORDER — NITROGLYCERIN 0.4 MG SL SUBL: 0.4000 mg | SUBLINGUAL_TABLET | SUBLINGUAL | Status: DC | PRN

## 2015-11-27 MED ORDER — ACETAMINOPHEN 325 MG PO TABS: 650.0000 mg | ORAL_TABLET | Freq: Four times a day (QID) | ORAL | Status: DC | PRN

## 2015-11-27 MED ORDER — GABAPENTIN 100 MG PO CAPS
100.0000 mg | ORAL_CAPSULE | Freq: Three times a day (TID) | ORAL | Status: DC
  Administered 2015-11-27 – 2015-12-05 (×23): 100 mg via ORAL
  Filled 2015-11-27 (×23): qty 1

## 2015-11-27 MED ORDER — DULOXETINE HCL 60 MG PO CPEP
60.0000 mg | ORAL_CAPSULE | Freq: Every day | ORAL | Status: DC
  Administered 2015-11-27 – 2015-12-05 (×9): 60 mg via ORAL
  Filled 2015-11-27 (×9): qty 1

## 2015-11-27 MED ORDER — FOLIC ACID 1 MG PO TABS
1.0000 mg | ORAL_TABLET | Freq: Every day | ORAL | Status: DC
  Administered 2015-11-27 – 2015-12-05 (×9): 1 mg via ORAL
  Filled 2015-11-27 (×9): qty 1

## 2015-11-27 MED ORDER — LEVALBUTEROL HCL 1.25 MG/0.5ML IN NEBU
1.2500 mg | INHALATION_SOLUTION | Freq: Four times a day (QID) | RESPIRATORY_TRACT | Status: DC
  Administered 2015-11-27 – 2015-12-03 (×23): 1.25 mg via RESPIRATORY_TRACT
  Filled 2015-11-27 (×23): qty 0.5

## 2015-11-27 MED ORDER — METOCLOPRAMIDE HCL 10 MG PO TABS: 10.0000 mg | ORAL_TABLET | Freq: Four times a day (QID) | ORAL | Status: DC | PRN

## 2015-11-27 MED ORDER — ATORVASTATIN CALCIUM 20 MG PO TABS
80.0000 mg | ORAL_TABLET | Freq: Every day | ORAL | Status: DC
  Administered 2015-11-27 – 2015-12-02 (×6): 80 mg via ORAL
  Filled 2015-11-27 (×2): qty 1
  Filled 2015-11-27 (×6): qty 4

## 2015-11-27 MED ORDER — HYDROXYZINE HCL 10 MG PO TABS
10.0000 mg | ORAL_TABLET | Freq: Three times a day (TID) | ORAL | Status: DC | PRN
  Administered 2015-11-28 – 2015-11-29 (×3): 10 mg via ORAL
  Filled 2015-11-27 (×3): qty 1

## 2015-11-27 MED ORDER — LEVOFLOXACIN IN D5W 500 MG/100ML IV SOLN
500.0000 mg | INTRAVENOUS | Status: DC
  Administered 2015-11-27: 500 mg via INTRAVENOUS
  Filled 2015-11-27 (×2): qty 100

## 2015-11-27 MED ORDER — QUETIAPINE FUMARATE 25 MG PO TABS
50.0000 mg | ORAL_TABLET | Freq: Every day | ORAL | Status: DC
  Administered 2015-11-27 – 2015-12-04 (×8): 50 mg via ORAL
  Filled 2015-11-27 (×9): qty 2

## 2015-11-27 MED ORDER — FENTANYL 12 MCG/HR TD PT72
12.5000 ug | MEDICATED_PATCH | TRANSDERMAL | Status: DC
  Administered 2015-11-30: 12.5 ug via TRANSDERMAL
  Filled 2015-11-27: qty 1

## 2015-11-27 MED ORDER — SODIUM CHLORIDE 0.9% FLUSH
3.0000 mL | INTRAVENOUS | Status: DC | PRN
  Administered 2015-12-04: 3 mL via INTRAVENOUS
  Filled 2015-11-27: qty 3

## 2015-11-27 MED ORDER — LORAZEPAM 0.5 MG PO TABS
0.5000 mg | ORAL_TABLET | Freq: Three times a day (TID) | ORAL | Status: DC | PRN
  Administered 2015-11-27 – 2015-12-03 (×6): 0.5 mg via ORAL
  Filled 2015-11-27 (×6): qty 1

## 2015-11-27 MED ORDER — CALCIUM CARBONATE-VITAMIN D 500-200 MG-UNIT PO TABS
1.0000 | ORAL_TABLET | Freq: Two times a day (BID) | ORAL | Status: DC
  Administered 2015-11-27 – 2015-12-05 (×16): 1 via ORAL
  Filled 2015-11-27 (×16): qty 1

## 2015-11-27 MED ORDER — ONDANSETRON HCL 4 MG PO TABS
8.0000 mg | ORAL_TABLET | Freq: Three times a day (TID) | ORAL | Status: DC | PRN
  Administered 2015-12-02 – 2015-12-05 (×3): 8 mg via ORAL
  Filled 2015-11-27 (×3): qty 2

## 2015-11-27 MED ORDER — LEVOTHYROXINE SODIUM 88 MCG PO TABS
44.0000 ug | ORAL_TABLET | Freq: Every day | ORAL | Status: DC
  Administered 2015-11-28 – 2015-12-05 (×7): 44 ug via ORAL
  Filled 2015-11-27 (×7): qty 1

## 2015-11-27 MED ORDER — ONDANSETRON HCL 4 MG/2ML IJ SOLN
4.0000 mg | Freq: Once | INTRAMUSCULAR | Status: AC
  Administered 2015-11-27: 4 mg via INTRAVENOUS
  Filled 2015-11-27: qty 2

## 2015-11-27 MED ORDER — METHYLPREDNISOLONE SODIUM SUCC 125 MG IJ SOLR
60.0000 mg | Freq: Four times a day (QID) | INTRAMUSCULAR | Status: DC
  Administered 2015-11-27 – 2015-11-28 (×4): 60 mg via INTRAVENOUS
  Filled 2015-11-27 (×4): qty 2

## 2015-11-27 MED ORDER — SODIUM CHLORIDE 0.9% FLUSH
3.0000 mL | Freq: Two times a day (BID) | INTRAVENOUS | Status: DC
  Administered 2015-11-27 – 2015-12-01 (×5): 3 mL via INTRAVENOUS

## 2015-11-27 MED ORDER — MORPHINE SULFATE (PF) 2 MG/ML IV SOLN
1.0000 mg | INTRAVENOUS | Status: DC | PRN
  Administered 2015-11-27 – 2015-11-29 (×6): 2 mg via INTRAVENOUS
  Filled 2015-11-27 (×6): qty 1

## 2015-11-27 MED ORDER — SODIUM CHLORIDE 0.9 % IV SOLN
1000.0000 mL | Freq: Once | INTRAVENOUS | Status: AC
  Administered 2015-11-27: 1000 mL via INTRAVENOUS

## 2015-11-27 NOTE — Progress Notes (Signed)
Pt complaining of chest sharp chest pain that radiates across his whole chest. Pt received OXY IR 5 mg at 1836. Pt states that the pain medication did not work at all. MD Posey Pronto notified. Verbal order given for Morphine 1-2 mg every 4 hours for severe pain. RN to place order, and administer as ordered. Will continue to monitor.   Darryl Hatfield

## 2015-11-27 NOTE — ED Notes (Signed)
Daughter Javarian Jakubiak contact: [phone]

## 2015-11-27 NOTE — ED Notes (Signed)
Pt daughter to nurses station reporting pt feeling short of breath. O2  97% on 2 L [place]. Pt with tachypnea but no increased work of breathing noted. MD made aware.

## 2015-11-27 NOTE — ED Triage Notes (Addendum)
Patient has history of lung CA just discharged from [facility] yesterday.  C/O mis chest pain and nose bleed today. Descrbies pain as sharp pain, unsure if pain is 'cancer pain'.  1 spray of NTG and 324 mg ASA given by EMS, no change in pain.

## 2015-11-27 NOTE — ED Notes (Signed)
Patient states he had been feeling SOB since discharge from hospital yesterday.  Newly on 2L/[place] at home.  This morning patient was feeling SOB and spoke with PCP's office.  Saturation was low, in the 80's, at home and patient advised to come in to ED for evaluation.  First responders told patient that O2 tubing was too long.

## 2015-11-27 NOTE — ED Notes (Signed)
Pt reports one episode of hemoptysis. Small amount of congealed blood noted in emesis bag. Pt O2 sat 88% on 2L [place]. Oxygen increased to 4L [place]. O2 sat increased to 92%. MD made aware. Will continue to monitor.

## 2015-11-27 NOTE — H&P (Signed)
[facility] [place] NAME: Darryl Hatfield    MR#:  [id number]  DATE OF BIRTH:  [birth year]  DATE OF ADMISSION:  11/27/2015  PRIMARY CARE PHYSICIAN: Marinda Elk, MD   REQUESTING/REFERRING PHYSICIAN: Lavonia Drafts MD  CHIEF COMPLAINT:   Chief Complaint  Patient presents with  . Chest Pain    HISTORY OF PRESENT ILLNESS: Darryl Hatfield  is a 55 y.o. 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 Patient was discharged to on oxygen therapy. He reports that after getting home he started having nasal bleed which stopped and then again he started having nose of bleed this morning. Continued to persist therefore he decided to come to the ER. Worsening shortness of breath. Patient started having some hemoptysis as well. He has not had any fevers or chills no chest pains or palpitations  PAST MEDICAL HISTORY:   Past Medical History:  Diagnosis Date  . CHF (congestive heart failure) ([place])   . COPD (chronic obstructive pulmonary disease) ([place])   . Hypothyroidism   . Lung cancer ([place]) 2015   RIght   . Seizures ([place])     PAST SURGICAL HISTORY: Past Surgical History:  Procedure Laterality Date  . BACK SURGERY    . COLONOSCOPY  10/2015  . PORTACATH PLACEMENT Right     SOCIAL HISTORY:  Social History  Substance Use Topics  . Smoking status: Former Smoker    Packs/day: 1.00    Years: 40.00    Quit date: 06/20/2013  . Smokeless tobacco: Never Used  . Alcohol use No    FAMILY HISTORY:  Family History  Problem Relation Age of Onset  . Hypertension Other   . Heart failure Other   . Cancer Father     DRUG ALLERGIES:  Allergies  Allergen Reactions  . No Known Allergies     REVIEW OF SYSTEMS:   CONSTITUTIONAL: No fever, fatigue or weakness.   EYES: No blurred or double vision.  EARS, NOSE, AND THROAT: No tinnitus or ear pain.  RESPIRATORY: Positive cough, positive shortness of breath, wheezing or positive hemoptysis.  CARDIOVASCULAR: No chest pain, orthopnea, edema.  GASTROINTESTINAL: No nausea, vomiting, diarrhea or abdominal pain.  GENITOURINARY: No dysuria, hematuria.  ENDOCRINE: No polyuria, nocturia,  HEMATOLOGY: No anemia, easy bruising or bleeding SKIN: No rash or lesion. MUSCULOSKELETAL: No joint pain or arthritis.   NEUROLOGIC: No tingling, numbness, weakness.  PSYCHIATRY: No anxiety or depression.   MEDICATIONS AT HOME:  Prior to Admission medications   Medication Sig Start Date End Date Taking? Authorizing Provider  aspirin 81 MG EC tablet Take 81 mg by mouth daily.  09/25/15  Yes Historical Provider, MD  Calcium Carbonate-Vitamin D 600-400 MG-UNIT tablet Take 1 tablet by mouth 2 (two) times daily.  09/26/15 09/25/16 Yes Historical Provider, MD  DULoxetine (CYMBALTA) 30 MG capsule Take 60 mg by mouth daily.    Yes Historical Provider, MD  fentaNYL (DURAGESIC - DOSED MCG/HR) 12 MCG/HR Place 1 patch (12.5 mcg total) onto the skin every 3 (three) days. 11/20/15  Yes Lequita Asal, MD  folic acid (FOLVITE) 1 MG tablet TAKE 1 TABLET BY MOUTH DAILY START 5-7 DAYS BEFORE ALIMTA CHEMO, CONTINUE UNTIL 21 DAYS AFTER ALIMTA 09/13/15  Yes Lequita Asal, MD  gabapentin (NEURONTIN) 100 MG capsule  Take 1 capsule (100 mg total) by mouth 3 (three) times daily. 03/15/15  Yes Dmitriy Berenzon, MD  hydrOXYzine (ATARAX/VISTARIL) 10 MG tablet Take 1 tablet (10 mg total) by mouth 3 (three) times daily as needed for itching. 10/05/15  Yes Lequita Asal, MD  levothyroxine (SYNTHROID, LEVOTHROID) 88 MCG tablet Take 0.5 tablets (44 mcg total) by mouth daily before breakfast. 11/26/15  Yes Sital Mody, MD  LORazepam (ATIVAN) 0.5 MG tablet Take 1 tablet (0.5 mg total) by mouth every 8 (eight) hours as needed for anxiety. 06/16/15  Yes Lequita Asal, MD  metoCLOPramide (REGLAN) 10 MG tablet Take 1 tablet (10 mg total) by mouth every 6 (six) hours as needed. 09/29/15  Yes Carrie Mew, MD  metoprolol succinate (TOPROL-XL) 25 MG 24 hr tablet Take 25 mg by mouth 2 (two) times daily.  09/26/15  Yes Historical Provider, MD  oxyCODONE (OXY IR/ROXICODONE) 5 MG immediate release tablet 1 tablet every 3 - 4 hours as needed for pain 11/20/15  Yes Lequita Asal, MD  QUEtiapine (SEROQUEL) 50 MG tablet Take 50 mg by mouth daily.  07/18/15 01/14/16 Yes Historical Provider, MD  acetaminophen (TYLENOL) 325 MG tablet Take 650 mg by mouth every 6 (six) hours as needed for mild pain.  04/21/14   Historical Provider, MD  albuterol (PROVENTIL) (2.5 MG/3ML) 0.083% nebulizer solution Take 3 mLs (2.5 mg total) by nebulization every 6 (six) hours as needed for wheezing or shortness of breath. Patient taking differently: Take 2.5 mg by nebulization 2 (two) times daily.  02/03/15   Dmitriy Berenzon, MD  atorvastatin (LIPITOR) 80 MG tablet Take 80 mg by mouth daily.  09/25/15 11/22/15  Historical Provider, MD  mometasone-formoterol (DULERA) 100-5 MCG/ACT AERO Inhale 2 puffs into the lungs 2 (two) times daily. Patient not taking: Reported on 11/27/2015 11/26/15   Bettey Costa, MD  nitroGLYCERIN (NITROSTAT) 0.4 MG SL tablet Place 0.4 mg under the tongue every 5 (five) minutes as needed.  09/25/15   Historical Provider, MD  ondansetron (ZOFRAN) 8 MG tablet Take 1 tablet (8 mg total) by mouth every 8 (eight) hours as needed for nausea or vomiting. 09/07/15   Lequita Asal, MD  pantoprazole (PROTONIX) 40 MG tablet Take 40 mg by mouth daily.  09/25/15 11/22/15  Historical Provider, MD  predniSONE (DELTASONE) 10 MG tablet Take 1 tablet (10 mg total) by mouth daily with breakfast. 60 mg PO (ORAL) x 2 days 50 mg PO (ORAL)  x 2 days 40 mg PO (ORAL)  x 2 days 30 mg PO  (ORAL)  x 2 days 20 mg PO  (ORAL) x 2 days 10 mg PO  (ORAL) x 2 days then stop Patient not taking: Reported  on 11/27/2015 11/26/15   Bettey Costa, MD      PHYSICAL EXAMINATION:   VITAL SIGNS: Blood pressure 131/82, pulse (!) 104, temperature 97.8 F (36.6 C), temperature source Oral, resp. rate 18, height '5\' 8"'$  (1.727 m), weight 137 lb (62.1 kg), SpO2 92 %.  GENERAL:  55 y.o.-year-old patient lying in the bed with no acute distress.  EYES: Pupils equal, round, reactive to light and accommodation. No scleral icterus. Extraocular muscles intact.  HEENT: Head atraumatic, normocephalic. Oropharynx and nasopharynx clear.  NECK:  Supple, no jugular venous distention. No thyroid enlargement, no tenderness.  LUNGS: Bilateral wheezing throughout both lungs some associated muscle usage  CARDIOVASCULAR: S1, S2 normal. No murmurs, rubs, or gallops.  ABDOMEN: Soft, nontender, nondistended. Bowel sounds present. No organomegaly or  mass.  EXTREMITIES: No pedal edema, cyanosis, or clubbing.  NEUROLOGIC: Cranial nerves II through XII are intact. Muscle strength 5/5 in all extremities. Sensation intact. Gait not checked.  PSYCHIATRIC: The patient is alert and oriented x 3.  SKIN: No obvious rash, lesion, or ulcer.   LABORATORY PANEL:   CBC  Recent Labs Lab 11/22/15 1240 11/23/15 0507 11/27/15 1257  WBC 7.9 5.5 8.4  HGB 12.2* 10.0* 10.5*  HCT 37.4* 29.5* 31.7*  PLT 119* 92* 119*  MCV 89.5 88.4 88.2  MCH 29.3 29.9 29.1  MCHC 32.7 33.8 33.0  RDW 16.7* 16.5* 16.7*  LYMPHSABS 1.3  --   --   MONOABS 0.9  --   --   EOSABS 0.2  --   --   BASOSABS 0.0  --   --    ------------------------------------------------------------------------------------------------------------------  Chemistries   Recent Labs Lab 11/22/15 1240 11/23/15 0507 11/25/15 0429 11/27/15 1257  NA 134* 135 136 136  K 4.3 4.2 4.5 4.2  CL 91* 98* 94* 93*  CO2 27 28 32 32  GLUCOSE 84 76 164* 81  BUN 22* 15 14 21*  CREATININE 1.51* 1.22 1.19 1.09  CALCIUM 10.1 9.3 9.5 10.2  AST 38  --   --   --   ALT 19  --   --   --    ALKPHOS 130*  --   --   --   BILITOT 1.4*  --   --   --    ------------------------------------------------------------------------------------------------------------------ estimated creatinine clearance is 66.5 mL/min (by C-G formula based on SCr of 1.09 mg/dL). ------------------------------------------------------------------------------------------------------------------ No results for input(s): TSH, T4TOTAL, T3FREE, THYROIDAB in the last 72 hours.  Invalid input(s): FREET3   Coagulation profile  Recent Labs Lab 11/22/15 1240  INR 1.31   ------------------------------------------------------------------------------------------------------------------- No results for input(s): DDIMER in the last 72 hours. -------------------------------------------------------------------------------------------------------------------  Cardiac Enzymes  Recent Labs Lab 11/27/15 1257  TROPONINI <0.03   ------------------------------------------------------------------------------------------------------------------ Invalid input(s): POCBNP  ---------------------------------------------------------------------------------------------------------------  Urinalysis    Component Value Date/Time   COLORURINE STRAW (A) 11/04/2015 0938   APPEARANCEUR CLEAR (A) 11/04/2015 0938   APPEARANCEUR Clear 11/12/2013 1142   LABSPEC 1.036 (H) 11/04/2015 0938   LABSPEC 1.034 11/12/2013 1142   PHURINE 5.0 11/04/2015 0938   GLUCOSEU NEGATIVE 11/04/2015 0938   GLUCOSEU Negative 11/12/2013 1142   HGBUR NEGATIVE 11/04/2015 0938   BILIRUBINUR NEGATIVE 11/04/2015 0938   BILIRUBINUR Negative 11/12/2013 1142   KETONESUR NEGATIVE 11/04/2015 0938   PROTEINUR NEGATIVE 11/04/2015 0938   NITRITE NEGATIVE 11/04/2015 0938   LEUKOCYTESUR NEGATIVE 11/04/2015 0938   LEUKOCYTESUR Negative 11/12/2013 1142     RADIOLOGY: Dg Chest 2 View  Result Date: 11/27/2015 CLINICAL DATA:  Short of breath. EXAM: CHEST  2  VIEW COMPARISON:  11/25/2015. FINDINGS: Port-A-Cath noted with lead tip projected over the right atrium. Heart size normal. Right perihilar pleural-parenchymal thickening consistent with scarring noted. Right base pleural thickening again noted consistent with scarring. No acute abnormality identified. Prior thoracolumbar spine fusion IMPRESSION: 1. Port-A-Cath noted with tip projected over right atrium. 2. Right perihilar pleural-parenchymal thickening consistent with scarring. Chest is stable from prior exam. Electronically Signed   By: Marcello Moores  Register   On: 11/27/2015 13:54   Ct Angio Chest Pe W And/or Wo Contrast  Result Date: 11/27/2015 CLINICAL DATA:  Hospitalized for pneumonia. Discharged yesterday. Coughing up blood while on the CT table. Hx bilateral lung cancer. Mid chest pain and nose bleeds today. Chest pain is severe. Pt has a port. EXAM: CT  ANGIOGRAPHY CHEST WITH CONTRAST TECHNIQUE: Multidetector CT imaging of the chest was performed using the standard protocol during bolus administration of intravenous contrast. Multiplanar CT image reconstructions and MIPs were obtained to evaluate the vascular anatomy. CONTRAST:  75 mL of Isovue 370 intravenous contrast COMPARISON:  11/04/2015 and multiple prior chest CTs. FINDINGS: Cardiovascular: Satisfactory opacification of the pulmonary arteries to the segmental level. No evidence of pulmonary embolism. Normal heart size. No pericardial effusion. Minor coronary artery calcifications. Great vessels normal in caliber. No aortic dissection. No significant aortic atherosclerosis. Mediastinum/Nodes: 1 cm short axis left supraclavicular node, stable from the most recent prior chest CT. Prominent right axillary and subpectoral lymph nodes are also stable. Multiple prominent to mildly enlarged mediastinal lymph nodes. Reference measurement of a prevascular node near the AP window this 12 mm in short axis, also unchanged from most recent prior study. No new  mediastinal adenopathy. Abnormal soft tissue surrounds the right hilar structures, without change from the most recent prior exam. Mildly enlarged left hilar lymph node measures 12 mm short axis, stable. Lungs/Pleura: Focal opacity extends from the right hilum into the right upper lobe consistent with treatment related scarring, stable from the most recent prior exam. Residual right hilar/perihilar tumor is possible. 6.7 mm nodule in the right lower lobe, image 80, series 6, without significant change from the most recent prior study. 4 mm nodule on the left lower lobe, image 70, series 6, also without significant change from the most recent prior exam. No other discrete pulmonary nodules. Changes of moderate centrilobular emphysema are stable. There is mild interstitial thickening and bronchial wall thickening in the lower lobes greatest on the right. Minimal pleural effusions. No pneumothorax. Upper Abdomen: No acute findings. Bilateral adrenal masses stable from the prior exam. Musculoskeletal: No osteoblastic or osteolytic lesions. Review of the MIP images confirms the above findings. IMPRESSION: 1. No evidence of a pulmonary embolus. No acute findings hemi chest. 2. Findings consistent with metastatic lung carcinoma with left supraclavicular, right axillary and subpectoral and mediastinal adenopathy, single mildly enlarged left hilar lymph node and abnormal soft tissue along the right hilum and central right upper lobe. Small nodules, 1 in the right lower lobe with another in the left lower lobe, both stable from the most recent prior exam. Bilateral adrenal masses also consistent with metastatic disease. Electronically Signed   By: Lajean Manes M.D.   On: 11/27/2015 16:01    EKG: Orders placed or performed during the hospital encounter of 11/27/15  . ED EKG within 10 minutes  . ED EKG within 10 minutes    IMPRESSION AND PLAN: Patient is a 55 year old male with metastatic lung cancer with worsening  shortness of breath for discharge yesterday   1. Acute on chronic respiratory failure CT scan of the chest shows no new changes Suspect his respiratory worsening is due to acute on chronic COPD exasperation I will place patient on IV Solu-Medrol nebulizers and IV Levaquin Due to recurrent symptoms as pulmonary to see  2. Lung cancer with metastatic disease I will ask his oncologist to come see and give some prognosis to the patient patient has been admitted multiple times within the past few months for various reasons  3. History of congestive heart failure currently compensated  4. History of atypical chest pain recent cardiac catheter  5. Hemoptysis suspect due to either acute bronchitis, oxygen therapy, or due to lung cancer  6. CODE STATUS full prognosis poor I'll last palliative care team to see  All the records are reviewed and case discussed with ED provider. Management plans discussed with the patient, family and they are in agreement.  CODE STATUS: Code Status History    Date Active Date Inactive Code Status Order ID Comments User Context   11/22/2015  4:47 PM 11/26/2015  8:30 PM Full Code [id number]  Epifanio Lesches, MD ED   11/04/2015 11:40 AM 11/06/2015  3:15 AM Full Code [id number]  Idelle Crouch, MD Inpatient   12/25/2014  4:39 AM 12/26/2014  6:04 PM Full Code [id number]  Harrie Foreman, MD Inpatient    Advance Directive Documentation   Flowsheet Row Most Recent Value  Type of Advance Directive  Living will, Healthcare Power of Attorney  Pre-existing out of facility DNR order (yellow form or pink MOST form)  No data  "MOST" Form in Place?  No data       TOTAL TIME TAKING CARE OF THIS PATIENT: 55 minutes.    Dustin Flock M.D on 11/27/2015 at 4:37 PM  Between 7am to 6pm - Pager - [phone]  After 6pm go to [email or web address] - password [email or web address] [facility]  [facility]  Office  [phone]  CC: Primary care physician; Marinda Elk,  MD

## 2015-11-27 NOTE — ED Provider Notes (Signed)
[facility] Emergency Department Provider Note   ____________________________________________    I have reviewed the triage vital signs and the nursing notes.   HISTORY  Chief Complaint Chest Pain     HPI Darryl Hatfield is a 55 y.o. male With a history of CHF and COPD and active lung CA who presents with chest discomfort and mild soreness of breath. He reports he was recently discharged from the hospital for pneumonia. He had been feeling better but today began with chest discomfort which is substernal.he does report a history of pulmonary emboli and does take Lovenox.he notes that he developed a nosebleed as well after discharge at home but denies hemoptysis. He notes it improved with pressure   Past Medical History:  Diagnosis Date  . CHF (congestive heart failure) ([place])   . COPD (chronic obstructive pulmonary disease) ([place])   . Hypothyroidism   . Lung cancer ([place]) 2015   RIght   . Seizures [facility])     Patient Active Problem List   Diagnosis Date Noted  . Acute respiratory failure with hypoxemia ([place]) 11/22/2015  . Bone metastasis ([place]) 11/17/2015  . Symptomatic anemia 11/04/2015  . Chest pain 11/04/2015  . Tachycardia 11/04/2015  . Pulmonary embolism without acute cor pulmonale ([place]) 10/07/2015  . Acute deep vein thrombosis (DVT) of femoral vein of both lower extremities ([place]) 10/07/2015  . Colitis 10/07/2015  . Dehydration 09/28/2015  . Epigastric discomfort 09/16/2015  . STEMI (ST elevation myocardial infarction) ([place]) 09/16/2015  . Pruritus 07/27/2015  . Constipation 06/05/2015  . Anxiety 05/12/2015  . Weight loss 05/12/2015  . 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 Adenocarcinoma of lung, stage 4 ([place]) 04/25/2015  . Iron deficiency anemia 04/05/2015  . Episodes of formed visual hallucinations 12/26/2014  . Dementia with behavioral disturbance 12/26/2014  . Delirium 12/25/2014  . Cancer of upper lobe of right lung ([place])  07/02/2013  . SCIATICA 03/01/2010    Past Surgical History:  Procedure Laterality Date  . BACK SURGERY    . COLONOSCOPY  10/2015  . PORTACATH PLACEMENT Right     Prior to Admission medications   Medication Sig Start Date End Date Taking? Authorizing Provider  aspirin 81 MG EC tablet Take 81 mg by mouth daily.  09/25/15  Yes Historical Provider, MD  Calcium Carbonate-Vitamin D 600-400 MG-UNIT tablet Take 1 tablet by mouth 2 (two) times daily.  09/26/15 09/25/16 Yes Historical Provider, MD  DULoxetine (CYMBALTA) 30 MG capsule Take 60 mg by mouth daily.    Yes Historical Provider, MD  fentaNYL (DURAGESIC - DOSED MCG/HR) 12 MCG/HR Place 1 patch (12.5 mcg total) onto the skin every 3 (three) days. 11/20/15  Yes Lequita Asal, MD  folic acid (FOLVITE) 1 MG tablet TAKE 1 TABLET BY MOUTH DAILY START 5-7 DAYS BEFORE ALIMTA CHEMO, CONTINUE UNTIL 21 DAYS AFTER ALIMTA 09/13/15  Yes Lequita Asal, MD  gabapentin (NEURONTIN) 100 MG capsule Take 1 capsule (100 mg total) by mouth 3 (three) times daily. 03/15/15  Yes Dmitriy Berenzon, MD  hydrOXYzine (ATARAX/VISTARIL) 10 MG tablet Take 1 tablet (10 mg total) by mouth 3 (three) times daily as needed for itching. 10/05/15  Yes Lequita Asal, MD  levothyroxine (SYNTHROID, LEVOTHROID) 88 MCG tablet Take 0.5 tablets (44 mcg total) by mouth daily before breakfast. 11/26/15  Yes Sital Mody, MD  LORazepam (ATIVAN) 0.5 MG tablet Take 1 tablet (0.5 mg total) by mouth every 8 (eight) hours as needed for anxiety. 06/16/15  Yes Lequita Asal, MD  metoCLOPramide (REGLAN) 10 MG tablet Take 1 tablet (10 mg total) by mouth every 6 (six) hours as needed. 09/29/15  Yes Carrie Mew, MD  metoprolol succinate (TOPROL-XL) 25 MG 24 hr tablet Take 25 mg by mouth 2 (two) times daily.  09/26/15  Yes Historical Provider, MD  oxyCODONE (OXY IR/ROXICODONE) 5 MG immediate release tablet 1 tablet every 3 - 4 hours as needed for pain 11/20/15  Yes Lequita Asal, MD    QUEtiapine (SEROQUEL) 50 MG tablet Take 50 mg by mouth daily.  07/18/15 01/14/16 Yes Historical Provider, MD  acetaminophen (TYLENOL) 325 MG tablet Take 650 mg by mouth every 6 (six) hours as needed for mild pain.  04/21/14   Historical Provider, MD  albuterol (PROVENTIL) (2.5 MG/3ML) 0.083% nebulizer solution Take 3 mLs (2.5 mg total) by nebulization every 6 (six) hours as needed for wheezing or shortness of breath. Patient taking differently: Take 2.5 mg by nebulization 2 (two) times daily.  02/03/15   Dmitriy Berenzon, MD  atorvastatin (LIPITOR) 80 MG tablet Take 80 mg by mouth daily.  09/25/15 11/22/15  Historical Provider, MD  mometasone-formoterol (DULERA) 100-5 MCG/ACT AERO Inhale 2 puffs into the lungs 2 (two) times daily. Patient not taking: Reported on 11/27/2015 11/26/15   Bettey Costa, MD  nitroGLYCERIN (NITROSTAT) 0.4 MG SL tablet Place 0.4 mg under the tongue every 5 (five) minutes as needed.  09/25/15   Historical Provider, MD  ondansetron (ZOFRAN) 8 MG tablet Take 1 tablet (8 mg total) by mouth every 8 (eight) hours as needed for nausea or vomiting. 09/07/15   Lequita Asal, MD  pantoprazole (PROTONIX) 40 MG tablet Take 40 mg by mouth daily.  09/25/15 11/22/15  Historical Provider, MD  predniSONE (DELTASONE) 10 MG tablet Take 1 tablet (10 mg total) by mouth daily with breakfast. 60 mg PO (ORAL) x 2 days 50 mg PO (ORAL)  x 2 days 40 mg PO (ORAL)  x 2 days 30 mg PO  (ORAL)  x 2 days 20 mg PO  (ORAL) x 2 days 10 mg PO  (ORAL) x 2 days then stop Patient not taking: Reported on 11/27/2015 11/26/15   Bettey Costa, MD     Allergies No known allergies  Family History  Problem Relation Age of Onset  . Hypertension Other   . Heart failure Other   . Cancer Father     Social History Social History  Substance Use Topics  . Smoking status: Former Smoker    Packs/day: 1.00    Years: 40.00    Quit date: 06/20/2013  . Smokeless tobacco: Never Used  . Alcohol use No    Review of  Systems  Constitutional: No fever/chills   Cardiovascular: as above Respiratory:as above Gastrointestinal: No abdominal pain.     Musculoskeletal: Negative for back pain. Skin: Negative for rash. Neurological: Negative for headaches   10-point ROS otherwise negative.  ____________________________________________   PHYSICAL EXAM:  VITAL SIGNS: ED Triage Vitals  Enc Vitals Group     BP 11/27/15 1247 131/89     Pulse Rate 11/27/15 1244 (!) 115     Resp 11/27/15 1244 (!) 24     Temp 11/27/15 1244 97.8 F (36.6 C)     Temp Source 11/27/15 1244 Oral     SpO2 11/27/15 1247 93 %     Weight 11/27/15 1245 137 lb (62.1 kg)     Height 11/27/15 1245 '5\' 8"'$  (1.727 m)  Head Circumference --      Peak Flow --      Pain Score 11/27/15 1245 10     Pain Loc --      Pain Edu? --      Excl. in [place]? --     Constitutional: Alert and oriented. No acute distress. Pleasant and interactive Eyes: Conjunctivae are normal.   Nose: No congestion/rhinnorhea.  Cardiovascular: Normal rate, regular rhythm. Grossly normal heart sounds.  Good peripheral circulation. Respiratory: Normal respiratory effort.  No retractions. Mild scattered wheezes Gastrointestinal: Soft and nontender. No distention.  No CVA tenderness. Genitourinary: deferred Musculoskeletal: No lower extremity tenderness nor edema.  Warm and well perfused Neurologic:  Normal speech and language. No gross focal neurologic deficits are appreciated.  Skin:  Skin is warm, dry and intact. No rash noted. Psychiatric: Mood and affect are normal. Speech and behavior are normal.  ____________________________________________   LABS (all labs ordered are listed, but only abnormal results are displayed)  Labs Reviewed  BASIC METABOLIC PANEL - Abnormal; Notable for the following:       Result Value   Chloride 93 (*)    BUN 21 (*)    All other components within normal limits  CBC - Abnormal; Notable for the following:    RBC 3.59 (*)     Hemoglobin 10.5 (*)    HCT 31.7 (*)    RDW 16.7 (*)    Platelets 119 (*)    All other components within normal limits  TROPONIN I   ____________________________________________  EKG  ED ECG REPORT I, Lavonia Drafts, the attending physician, personally viewed and interpreted this ECG.  Date: 11/27/2015 EKG Time: 12:43 PM Rate: 108 Rhythm: sinus tachycardia QRS Axis: normal Intervals: normal ST/T Wave abnormalities: normal Conduction Disturbances: none   ____________________________________________  RADIOLOGY  Chest x-ray unremarkable ____________________________________________   PROCEDURES  Procedure(s) performed: No    Critical Care performed: No ____________________________________________   INITIAL IMPRESSION / ASSESSMENT AND PLAN / ED COURSE  Pertinent labs & imaging results that were available during my care of the patient were reviewed by me and considered in my medical decision making (see chart for details).  Patient presents with chest discomfort soon after discharge for pneumonia. Chest x-ray reassuring. Lab work is unremarkable. Patient with hx of PE on lovenox and reports compliance. He is hypoxic and tachycardic here. Minimal improvement with duoneb, minimal wheezing on exam. Has not had CTA since early 2016. Concern for worsening PE/PNA/COPD exac. Will obtain CTA. Have asked Dr. Alfred Levins to follow up on CTA and re-evaluate  Clinical Course    ____________________________________________   FINAL CLINICAL IMPRESSION(S) / ED DIAGNOSES  Chest pain Shortness of breath   NEW MEDICATIONS STARTED DURING THIS VISIT:  New Prescriptions   No medications on file     Note:  This document was prepared using Dragon voice recognition software and may include unintentional dictation errors.    Lavonia Drafts, MD 11/27/15 [phone]

## 2015-11-28 DIAGNOSIS — R5383 Other fatigue: Secondary | ICD-10-CM

## 2015-11-28 DIAGNOSIS — Z923 Personal history of irradiation: Secondary | ICD-10-CM

## 2015-11-28 DIAGNOSIS — B029 Zoster without complications: Secondary | ICD-10-CM

## 2015-11-28 DIAGNOSIS — Z515 Encounter for palliative care: Secondary | ICD-10-CM

## 2015-11-28 DIAGNOSIS — C7972 Secondary malignant neoplasm of left adrenal gland: Secondary | ICD-10-CM

## 2015-11-28 DIAGNOSIS — Z7189 Other specified counseling: Secondary | ICD-10-CM

## 2015-11-28 DIAGNOSIS — D509 Iron deficiency anemia, unspecified: Secondary | ICD-10-CM

## 2015-11-28 DIAGNOSIS — C3490 Malignant neoplasm of unspecified part of unspecified bronchus or lung: Secondary | ICD-10-CM | POA: Diagnosis present

## 2015-11-28 DIAGNOSIS — R042 Hemoptysis: Secondary | ICD-10-CM

## 2015-11-28 DIAGNOSIS — C7800 Secondary malignant neoplasm of unspecified lung: Secondary | ICD-10-CM

## 2015-11-28 DIAGNOSIS — C7971 Secondary malignant neoplasm of right adrenal gland: Secondary | ICD-10-CM

## 2015-11-28 DIAGNOSIS — G8929 Other chronic pain: Secondary | ICD-10-CM

## 2015-11-28 DIAGNOSIS — K529 Noninfective gastroenteritis and colitis, unspecified: Secondary | ICD-10-CM

## 2015-11-28 DIAGNOSIS — Z79899 Other long term (current) drug therapy: Secondary | ICD-10-CM

## 2015-11-28 DIAGNOSIS — Z9221 Personal history of antineoplastic chemotherapy: Secondary | ICD-10-CM

## 2015-11-28 DIAGNOSIS — E860 Dehydration: Secondary | ICD-10-CM

## 2015-11-28 DIAGNOSIS — C778 Secondary and unspecified malignant neoplasm of lymph nodes of multiple regions: Secondary | ICD-10-CM

## 2015-11-28 DIAGNOSIS — C3411 Malignant neoplasm of upper lobe, right bronchus or lung: Secondary | ICD-10-CM

## 2015-11-28 DIAGNOSIS — R0602 Shortness of breath: Secondary | ICD-10-CM

## 2015-11-28 LAB — CBC
HEMATOCRIT: 30.4 % — AB (ref 40.0–52.0)
HEMOGLOBIN: 9.8 g/dL — AB (ref 13.0–18.0)
MCH: 29 pg (ref 26.0–34.0)
MCHC: 32.3 g/dL (ref 32.0–36.0)
MCV: 89.8 fL (ref 80.0–100.0)
Platelets: 92 10*3/uL — ABNORMAL LOW (ref 150–440)
RBC: 3.38 MIL/uL — ABNORMAL LOW (ref 4.40–5.90)
RDW: 16.7 % — AB (ref 11.5–14.5)
WBC: 5 10*3/uL (ref 3.8–10.6)

## 2015-11-28 LAB — BASIC METABOLIC PANEL
ANION GAP: 9 (ref 5–15)
BUN: 20 mg/dL (ref 6–20)
CALCIUM: 9.3 mg/dL (ref 8.9–10.3)
CHLORIDE: 97 mmol/L — AB (ref 101–111)
CO2: 33 mmol/L — AB (ref 22–32)
Creatinine, Ser: 1.17 mg/dL (ref 0.61–1.24)
GFR calc Af Amer: >60 mL/min (ref >60)
GFR calc non Af Amer: >60 mL/min (ref >60)
GLUCOSE: 144 mg/dL — AB (ref 65–99)
Potassium: 4.6 mmol/L (ref 3.5–5.1)
Sodium: 139 mmol/L (ref 135–145)

## 2015-11-28 LAB — PROCALCITONIN: Procalcitonin: 0.16 ng/mL

## 2015-11-28 MED ORDER — METHYLPREDNISOLONE SODIUM SUCC 125 MG IJ SOLR: 60.0000 mg | INTRAMUSCULAR | Status: DC

## 2015-11-28 MED ORDER — TIOTROPIUM BROMIDE MONOHYDRATE 18 MCG IN CAPS
18.0000 ug | ORAL_CAPSULE | Freq: Every day | RESPIRATORY_TRACT | Status: DC
  Administered 2015-11-28 – 2015-12-05 (×8): 18 ug via RESPIRATORY_TRACT
  Filled 2015-11-28 (×2): qty 5

## 2015-11-28 MED ORDER — METHYLPREDNISOLONE SODIUM SUCC 40 MG IJ SOLR
40.0000 mg | Freq: Four times a day (QID) | INTRAMUSCULAR | Status: DC
  Administered 2015-11-28 – 2015-12-05 (×28): 40 mg via INTRAVENOUS
  Filled 2015-11-28 (×28): qty 1

## 2015-11-28 MED ORDER — LEVOFLOXACIN 500 MG PO TABS
500.0000 mg | ORAL_TABLET | ORAL | Status: DC
  Administered 2015-11-28: 500 mg via ORAL
  Filled 2015-11-28: qty 1

## 2015-11-28 MED ORDER — GUAIFENESIN ER 600 MG PO TB12
600.0000 mg | ORAL_TABLET | Freq: Two times a day (BID) | ORAL | Status: DC
  Administered 2015-11-28 – 2015-12-05 (×15): 600 mg via ORAL
  Filled 2015-11-28 (×15): qty 1

## 2015-11-28 NOTE — Progress Notes (Signed)
Assisted patient with flutter device.  Patient able to perform without difficulty. Lungs sounds indicate some rhonci that clears with cough however patient not able to cough anything up spontaneously or with flutter valve.

## 2015-11-28 NOTE — Progress Notes (Signed)
   [place] at [facility] Day: 1 day Darryl Hatfield is a 55 y.o. male presenting with Chest Pain .   Advance care planning discussed with patient  without additional Family at bedside. All questions in regards to overall condition and expected prognosis answered. The decision was made to continue current code status  CODE STATUS: full Time spent: 18 minutes   Patient states he is willing to undergo CPR and "short term" intubation/mechanical ventilation

## 2015-11-28 NOTE — Progress Notes (Signed)
[place] at [place] NAME: Darryl Hatfield    MRN#:  [id number]  DATE OF BIRTH:  June 29, 1960  SUBJECTIVE:  Hospital Day: 1 day Darryl Hatfield is a 55 y.o. male presenting with Chest Pain .   Overnight events: No acute overnight events Interval Events: States breathing better this morning, on 4 L oxygen  REVIEW OF SYSTEMS:  CONSTITUTIONAL: No fever, fatigue or weakness.  EYES: No blurred or double vision.  EARS, NOSE, AND THROAT: No tinnitus or ear pain.  RESPIRATORY: Positive cough, shortness of breath, denies wheezing or hemoptysis.  CARDIOVASCULAR: No chest pain, orthopnea, edema.  GASTROINTESTINAL: No nausea, vomiting, diarrhea or abdominal pain.  GENITOURINARY: No dysuria, hematuria.  ENDOCRINE: No polyuria, nocturia,  HEMATOLOGY: No anemia, easy bruising or bleeding SKIN: No rash or lesion. MUSCULOSKELETAL: No joint pain or arthritis.   NEUROLOGIC: No tingling, numbness, weakness.  PSYCHIATRY: No anxiety or depression.   DRUG ALLERGIES:   Allergies  Allergen Reactions  . No Known Allergies     VITALS:  Blood pressure 123/74, pulse (!) 103, temperature 98 F (36.7 C), temperature source Oral, resp. rate 20, height '5\' 8"'$  (1.727 m), weight 60.7 kg (133 lb 14.4 oz), SpO2 91 %.  PHYSICAL EXAMINATION:  VITAL SIGNS: Vitals:   11/28/15 0804 11/28/15 1129  BP: 130/78 123/74  Pulse: (!) 104 (!) 103  Resp: 20   Temp: 98.4 F (36.9 C) 98 F (36.7 C)   GENERAL:55 y.o.male currently in no acute distress.  HEAD: Normocephalic, atraumatic.  EYES: Pupils equal, round, reactive to light. Extraocular muscles intact. No scleral icterus.  MOUTH: Moist mucosal membrane. Dentition intact. No abscess noted.  EAR, NOSE, THROAT: Clear without exudates. No external lesions.  NECK: Supple. No thyromegaly. No nodules. No JVD.  PULMONARY: Coarse rhonchi bilateral without wheeze rails or rhonci. No use of accessory muscles, Good  respiratory effort. good air entry bilaterally CHEST: Nontender to palpation.  CARDIOVASCULAR: S1 and S2. Regular rate and rhythm. No murmurs, rubs, or gallops. No edema. Pedal pulses 2+ bilaterally.  GASTROINTESTINAL: Soft, nontender, nondistended. No masses. Positive bowel sounds. No hepatosplenomegaly.  MUSCULOSKELETAL: No swelling, clubbing, or edema. Range of motion full in all extremities.  NEUROLOGIC: Cranial nerves II through XII are intact. No gross focal neurological deficits. Sensation intact. Reflexes intact.  SKIN: No ulceration, lesions, rashes, or cyanosis. Skin warm and dry. Turgor intact.  PSYCHIATRIC: Mood, affect within normal limits. The patient is awake, alert and oriented x 3. Insight, judgment intact.      LABORATORY PANEL:   CBC  Recent Labs Lab 11/28/15 0342  WBC 5.0  HGB 9.8*  HCT 30.4*  PLT 92*   ------------------------------------------------------------------------------------------------------------------  Chemistries   Recent Labs Lab 11/22/15 1240  11/28/15 0342  NA 134*  < > 139  K 4.3  < > 4.6  CL 91*  < > 97*  CO2 27  < > 33*  GLUCOSE 84  < > 144*  BUN 22*  < > 20  CREATININE 1.51*  < > 1.17  CALCIUM 10.1  < > 9.3  AST 38  --   --   ALT 19  --   --   ALKPHOS 130*  --   --   BILITOT 1.4*  --   --   < > = values in this interval not displayed. ------------------------------------------------------------------------------------------------------------------  Cardiac Enzymes  Recent Labs Lab 11/27/15 1257  TROPONINI <0.03   ------------------------------------------------------------------------------------------------------------------  RADIOLOGY:  Dg Chest 2 View  Result Date: 11/27/2015 CLINICAL DATA:  Short of breath. EXAM: CHEST  2 VIEW COMPARISON:  11/25/2015. FINDINGS: Port-A-Cath noted with lead tip projected over the right atrium. Heart size normal. Right perihilar pleural-parenchymal thickening consistent with  scarring noted. Right base pleural thickening again noted consistent with scarring. No acute abnormality identified. Prior thoracolumbar spine fusion IMPRESSION: 1. Port-A-Cath noted with tip projected over right atrium. 2. Right perihilar pleural-parenchymal thickening consistent with scarring. Chest is stable from prior exam. Electronically Signed   By: Marcello Moores  Register   On: 11/27/2015 13:54   Ct Angio Chest Pe W And/or Wo Contrast  Result Date: 11/27/2015 CLINICAL DATA:  Hospitalized for pneumonia. Discharged yesterday. Coughing up blood while on the CT table. Hx bilateral lung cancer. Mid chest pain and nose bleeds today. Chest pain is severe. Pt has a port. EXAM: CT ANGIOGRAPHY CHEST WITH CONTRAST TECHNIQUE: Multidetector CT imaging of the chest was performed using the standard protocol during bolus administration of intravenous contrast. Multiplanar CT image reconstructions and MIPs were obtained to evaluate the vascular anatomy. CONTRAST:  75 mL of Isovue 370 intravenous contrast COMPARISON:  11/04/2015 and multiple prior chest CTs. FINDINGS: Cardiovascular: Satisfactory opacification of the pulmonary arteries to the segmental level. No evidence of pulmonary embolism. Normal heart size. No pericardial effusion. Minor coronary artery calcifications. Great vessels normal in caliber. No aortic dissection. No significant aortic atherosclerosis. Mediastinum/Nodes: 1 cm short axis left supraclavicular node, stable from the most recent prior chest CT. Prominent right axillary and subpectoral lymph nodes are also stable. Multiple prominent to mildly enlarged mediastinal lymph nodes. Reference measurement of a prevascular node near the AP window this 12 mm in short axis, also unchanged from most recent prior study. No new mediastinal adenopathy. Abnormal soft tissue surrounds the right hilar structures, without change from the most recent prior exam. Mildly enlarged left hilar lymph node measures 12 mm short  axis, stable. Lungs/Pleura: Focal opacity extends from the right hilum into the right upper lobe consistent with treatment related scarring, stable from the most recent prior exam. Residual right hilar/perihilar tumor is possible. 6.7 mm nodule in the right lower lobe, image 80, series 6, without significant change from the most recent prior study. 4 mm nodule on the left lower lobe, image 70, series 6, also without significant change from the most recent prior exam. No other discrete pulmonary nodules. Changes of moderate centrilobular emphysema are stable. There is mild interstitial thickening and bronchial wall thickening in the lower lobes greatest on the right. Minimal pleural effusions. No pneumothorax. Upper Abdomen: No acute findings. Bilateral adrenal masses stable from the prior exam. Musculoskeletal: No osteoblastic or osteolytic lesions. Review of the MIP images confirms the above findings. IMPRESSION: 1. No evidence of a pulmonary embolus. No acute findings hemi chest. 2. Findings consistent with metastatic lung carcinoma with left supraclavicular, right axillary and subpectoral and mediastinal adenopathy, single mildly enlarged left hilar lymph node and abnormal soft tissue along the right hilum and central right upper lobe. Small nodules, 1 in the right lower lobe with another in the left lower lobe, both stable from the most recent prior exam. Bilateral adrenal masses also consistent with metastatic disease. Electronically Signed   By: Lajean Manes M.D.   On: 11/27/2015 16:01    EKG:   Orders placed or performed during the hospital encounter of 11/27/15  . ED EKG within 10 minutes  . ED EKG within 10 minutes    ASSESSMENT AND PLAN:   Darryl Hatfield is a  55 y.o. male presenting with Chest Pain . Admitted 11/27/2015 : Day #: 1 day 1. Acute on chronic hypoxic respiratory failure: Multifactorial setting of lung cancer, COPD Continue with current Levaquin, check pro-calcitonin Decrease  steroids is not actively wheezing Continue breathing treatments, oxygen  2. Metastatic lung cancer oncology consult pending 3. Hemoptysis: Small amount either related to nosebleed from oxygen versus bronchitis versus cancer continue to monitor 4. Hyperlipidemia unspecified: Statin therapy   All the records are reviewed and case discussed with Care Management/Social Workerr. Management plans discussed with the patient, family and they are in agreement.  CODE STATUS: full TOTAL TIME TAKING CARE OF THIS PATIENT: 33 minutes.   POSSIBLE D/C IN 2-3DAYS, DEPENDING ON CLINICAL CONDITION.   Hower,  Karenann Cai.D on 11/28/2015 at 1:27 PM  Between 7am to 6pm - Pager - [phone]  After 6pm: House Pager: - [phone]  [facility]  Office  [phone]  CC: Primary care physician; Marinda Elk, MD

## 2015-11-28 NOTE — Care Management (Signed)
Patient discharged home 11/5 with [place] and new home 02.  Presents back with increase in chest pain and some hemoptysis.  Oncology and pulmonary and palliative care consults pending.  Patient is full code with metastatic lung cancer.  Notified [facility] of admission

## 2015-11-28 NOTE — Consult Note (Signed)
Pulmonary Critical Care  Initial Consult Note   Darryl Hatfield ZOX:[id number] DOB: 13-Aug-1960 DOA: 11/27/2015  Referring physician: Valentino Nose, MD PCP: Marinda Elk, MD   Chief Complaint: SOB  HPI: Darryl Hatfield is a 55 y.o. male with prior history of Lung cancer Stage IIIa just discharged 11/5 now presents back with increased shortness of breath. Apparently hospitalized in October for chest pain. He has had a cardiac cath done which has shown 50% Mid LAD and RCA along with diastolic heart failure. Patient during the last admission was treated with rocephin as well as azithromycin and was sent home with oxygen. Since being home patient has had worsening of shortness of breath. There is no chest pain noted. Patient has no fever noted and denies having chills. He has had nasal bleeding and possibly some hemoptysis. Patient was to follow with oncology regarding the lung cancer and wished to continue with therapy after discharge.   Review of Systems:  Constitutional:  +weight loss, no Fevers, chills, fatigue.  HEENT:  No headaches, nasal congestion, post nasal drip,  Cardio-vascular:  +chest pain, no swelling in lower extremities, palpitations  GI:  No heartburn, indigestion, abdominal pain, nausea, vomiting, diarrhea  Resp:  +shortness of breath with exertion and at rest. +coughing up of blood.No wheezing Skin:  no rash or lesions.  Musculoskeletal:  No joint pain or swelling.   Remainder ROS performed and is unremarkable other than noted in HPI  Past Medical History:  Diagnosis Date  . CHF (congestive heart failure) ([place])   . COPD (chronic obstructive pulmonary disease) ([place])   . Hypothyroidism   . Lung cancer ([place]) 2015   RIght   . Seizures ([place])    Past Surgical History:  Procedure Laterality Date  . BACK SURGERY    . COLONOSCOPY  10/2015  . PORTACATH PLACEMENT Right    Social History:  reports that he quit smoking about 2 years ago. He has a 40.00 pack-year  smoking history. He has never used smokeless tobacco. He reports that he does not drink alcohol or use drugs.  Allergies  Allergen Reactions  . No Known Allergies     Family History  Problem Relation Age of Onset  . Hypertension Other   . Heart failure Other   . Cancer Father     Prior to Admission medications   Medication Sig Start Date End Date Taking? Authorizing Provider  aspirin 81 MG EC tablet Take 81 mg by mouth daily.  09/25/15  Yes Historical Provider, MD  Calcium Carbonate-Vitamin D 600-400 MG-UNIT tablet Take 1 tablet by mouth 2 (two) times daily.  09/26/15 09/25/16 Yes Historical Provider, MD  DULoxetine (CYMBALTA) 30 MG capsule Take 60 mg by mouth daily.    Yes Historical Provider, MD  fentaNYL (DURAGESIC - DOSED MCG/HR) 12 MCG/HR Place 1 patch (12.5 mcg total) onto the skin every 3 (three) days. 11/20/15  Yes Lequita Asal, MD  folic acid (FOLVITE) 1 MG tablet TAKE 1 TABLET BY MOUTH DAILY START 5-7 DAYS BEFORE ALIMTA CHEMO, CONTINUE UNTIL 21 DAYS AFTER ALIMTA 09/13/15  Yes Lequita Asal, MD  gabapentin (NEURONTIN) 100 MG capsule Take 1 capsule (100 mg total) by mouth 3 (three) times daily. 03/15/15  Yes Dmitriy Berenzon, MD  hydrOXYzine (ATARAX/VISTARIL) 10 MG tablet Take 1 tablet (10 mg total) by mouth 3 (three) times daily as needed for itching. 10/05/15  Yes Lequita Asal, MD  levothyroxine (SYNTHROID, LEVOTHROID) 88 MCG tablet Take 0.5 tablets (44 mcg  total) by mouth daily before breakfast. 11/26/15  Yes Sital Mody, MD  LORazepam (ATIVAN) 0.5 MG tablet Take 1 tablet (0.5 mg total) by mouth every 8 (eight) hours as needed for anxiety. 06/16/15  Yes Lequita Asal, MD  metoCLOPramide (REGLAN) 10 MG tablet Take 1 tablet (10 mg total) by mouth every 6 (six) hours as needed. 09/29/15  Yes Carrie Mew, MD  metoprolol succinate (TOPROL-XL) 25 MG 24 hr tablet Take 25 mg by mouth 2 (two) times daily.  09/26/15  Yes Historical Provider, MD  oxyCODONE (OXY IR/ROXICODONE) 5  MG immediate release tablet 1 tablet every 3 - 4 hours as needed for pain 11/20/15  Yes Lequita Asal, MD  QUEtiapine (SEROQUEL) 50 MG tablet Take 50 mg by mouth daily.  07/18/15 01/14/16 Yes Historical Provider, MD  acetaminophen (TYLENOL) 325 MG tablet Take 650 mg by mouth every 6 (six) hours as needed for mild pain.  04/21/14   Historical Provider, MD  albuterol (PROVENTIL) (2.5 MG/3ML) 0.083% nebulizer solution Take 3 mLs (2.5 mg total) by nebulization every 6 (six) hours as needed for wheezing or shortness of breath. Patient taking differently: Take 2.5 mg by nebulization 2 (two) times daily.  02/03/15   Dmitriy Berenzon, MD  atorvastatin (LIPITOR) 80 MG tablet Take 80 mg by mouth daily.  09/25/15 11/22/15  Historical Provider, MD  mometasone-formoterol (DULERA) 100-5 MCG/ACT AERO Inhale 2 puffs into the lungs 2 (two) times daily. Patient not taking: Reported on 11/27/2015 11/26/15   Bettey Costa, MD  nitroGLYCERIN (NITROSTAT) 0.4 MG SL tablet Place 0.4 mg under the tongue every 5 (five) minutes as needed.  09/25/15   Historical Provider, MD  ondansetron (ZOFRAN) 8 MG tablet Take 1 tablet (8 mg total) by mouth every 8 (eight) hours as needed for nausea or vomiting. 09/07/15   Lequita Asal, MD  pantoprazole (PROTONIX) 40 MG tablet Take 40 mg by mouth daily.  09/25/15 11/22/15  Historical Provider, MD  predniSONE (DELTASONE) 10 MG tablet Take 1 tablet (10 mg total) by mouth daily with breakfast. 60 mg PO (ORAL) x 2 days 50 mg PO (ORAL)  x 2 days 40 mg PO (ORAL)  x 2 days 30 mg PO  (ORAL)  x 2 days 20 mg PO  (ORAL) x 2 days 10 mg PO  (ORAL) x 2 days then stop Patient not taking: Reported on 11/27/2015 11/26/15   Bettey Costa, MD   Physical Exam: Vitals:   11/28/15 0406 11/28/15 0708 11/28/15 0804 11/28/15 1129  BP: 119/81  130/78 123/74  Pulse: (!) 108  (!) 104 (!) 103  Resp: 18  20   Temp: 98.6 F (37 C)  98.4 F (36.9 C) 98 F (36.7 C)  TempSrc: Oral  Oral Oral  SpO2: 94% 91% 90% 91%    Weight: 60.7 kg (133 lb 14.4 oz)     Height:        Wt Readings from Last 3 Encounters:  11/28/15 60.7 kg (133 lb 14.4 oz)  11/26/15 62.1 kg (136 lb 14.4 oz)  11/15/15 61.8 kg (136 lb 3.9 oz)    General:  Appears calm and comfortable Eyes: PERRL, normal lids, irises & conjunctiva ENT: grossly normal hearing, lips & tongue Neck: no LAD, masses or thyromegaly Cardiovascular: RRR, no m/r/g. No LE edema. Respiratory: CTA bilaterally, no w/r/r. Normal respiratory effort. Abdomen: soft, nontender Skin: no rash or induration seen on limited exam Musculoskeletal: grossly normal tone BUE/BLE Psychiatric: grossly normal mood and affect Neurologic: grossly non-focal.  Labs on Admission:  Basic Metabolic Panel:  Recent Labs Lab 11/22/15 1240 11/23/15 0507 11/25/15 0429 11/27/15 1257 11/28/15 0342  NA 134* 135 136 136 139  K 4.3 4.2 4.5 4.2 4.6  CL 91* 98* 94* 93* 97*  CO2 27 28 32 32 33*  GLUCOSE 84 76 164* 81 144*  BUN 22* 15 14 21* 20  CREATININE 1.51* 1.22 1.19 1.09 1.17  CALCIUM 10.1 9.3 9.5 10.2 9.3   Liver Function Tests:  Recent Labs Lab 11/22/15 1240  AST 38  ALT 19  ALKPHOS 130*  BILITOT 1.4*  PROT 6.9  ALBUMIN 3.6   No results for input(s): LIPASE, AMYLASE in the last 168 hours. No results for input(s): AMMONIA in the last 168 hours. CBC:  Recent Labs Lab 11/22/15 1240 11/23/15 0507 11/27/15 1257 11/28/15 0342  WBC 7.9 5.5 8.4 5.0  NEUTROABS 5.5  --   --   --   HGB 12.2* 10.0* 10.5* 9.8*  HCT 37.4* 29.5* 31.7* 30.4*  MCV 89.5 88.4 88.2 89.8  PLT 119* 92* 119* 92*   Cardiac Enzymes:  Recent Labs Lab 11/27/15 1257  TROPONINI <0.03    BNP (last 3 results)  Recent Labs  09/16/15 1917  BNP 97.0    ProBNP (last 3 results) No results for input(s): PROBNP in the last 8760 hours.  CBG:  Recent Labs Lab 11/23/15 0721 11/24/15 0725 11/26/15 0822  GLUCAP 80 99 83    Radiological Exams on Admission: Dg Chest 2  View  Result Date: 11/27/2015 CLINICAL DATA:  Short of breath. EXAM: CHEST  2 VIEW COMPARISON:  11/25/2015. FINDINGS: Port-A-Cath noted with lead tip projected over the right atrium. Heart size normal. Right perihilar pleural-parenchymal thickening consistent with scarring noted. Right base pleural thickening again noted consistent with scarring. No acute abnormality identified. Prior thoracolumbar spine fusion IMPRESSION: 1. Port-A-Cath noted with tip projected over right atrium. 2. Right perihilar pleural-parenchymal thickening consistent with scarring. Chest is stable from prior exam. Electronically Signed   By: Marcello Moores  Register   On: 11/27/2015 13:54   Ct Angio Chest Pe W And/or Wo Contrast  Result Date: 11/27/2015 CLINICAL DATA:  Hospitalized for pneumonia. Discharged yesterday. Coughing up blood while on the CT table. Hx bilateral lung cancer. Mid chest pain and nose bleeds today. Chest pain is severe. Pt has a port. EXAM: CT ANGIOGRAPHY CHEST WITH CONTRAST TECHNIQUE: Multidetector CT imaging of the chest was performed using the standard protocol during bolus administration of intravenous contrast. Multiplanar CT image reconstructions and MIPs were obtained to evaluate the vascular anatomy. CONTRAST:  75 mL of Isovue 370 intravenous contrast COMPARISON:  11/04/2015 and multiple prior chest CTs. FINDINGS: Cardiovascular: Satisfactory opacification of the pulmonary arteries to the segmental level. No evidence of pulmonary embolism. Normal heart size. No pericardial effusion. Minor coronary artery calcifications. Great vessels normal in caliber. No aortic dissection. No significant aortic atherosclerosis. Mediastinum/Nodes: 1 cm short axis left supraclavicular node, stable from the most recent prior chest CT. Prominent right axillary and subpectoral lymph nodes are also stable. Multiple prominent to mildly enlarged mediastinal lymph nodes. Reference measurement of a prevascular node near the AP window this  12 mm in short axis, also unchanged from most recent prior study. No new mediastinal adenopathy. Abnormal soft tissue surrounds the right hilar structures, without change from the most recent prior exam. Mildly enlarged left hilar lymph node measures 12 mm short axis, stable. Lungs/Pleura: Focal opacity extends from the right hilum into the right upper lobe consistent  with treatment related scarring, stable from the most recent prior exam. Residual right hilar/perihilar tumor is possible. 6.7 mm nodule in the right lower lobe, image 80, series 6, without significant change from the most recent prior study. 4 mm nodule on the left lower lobe, image 70, series 6, also without significant change from the most recent prior exam. No other discrete pulmonary nodules. Changes of moderate centrilobular emphysema are stable. There is mild interstitial thickening and bronchial wall thickening in the lower lobes greatest on the right. Minimal pleural effusions. No pneumothorax. Upper Abdomen: No acute findings. Bilateral adrenal masses stable from the prior exam. Musculoskeletal: No osteoblastic or osteolytic lesions. Review of the MIP images confirms the above findings. IMPRESSION: 1. No evidence of a pulmonary embolus. No acute findings hemi chest. 2. Findings consistent with metastatic lung carcinoma with left supraclavicular, right axillary and subpectoral and mediastinal adenopathy, single mildly enlarged left hilar lymph node and abnormal soft tissue along the right hilum and central right upper lobe. Small nodules, 1 in the right lower lobe with another in the left lower lobe, both stable from the most recent prior exam. Bilateral adrenal masses also consistent with metastatic disease. Electronically Signed   By: Lajean Manes M.D.   On: 11/27/2015 16:01       EKG: Independently reviewed.  Assessment/Plan Active Problems:   SOB (shortness of breath)   Primary malignant neoplasm of lung metastatic to other  site [facility])   Hemoptysis   1. Shortness of breath -patient with progressive malignancy likely impinging on airway -would increased steroids to help reduce swelling inflammation -continue with inhalers as ordered  2. Metastatic SCCa of lung -will discuss with oncology  -may need bronch but has low platelets noted -consider hospice  3. Hemoptysis -secondary to tumor and or low platelets -follow up platelet count  Code Status: full code  Family Communication: none Disposition Plan:   Time spent: 12mn    I have personally obtained a history, examined the patient, evaluated laboratory and imaging results, formulated the assessment and plan and placed orders.  The Patient requires high complexity decision making for assessment and support.    SAllyne Gee MD FSurgicenter Of Eastern [facility] LLC Dba Vidant SurgicenterPulmonary Critical Care Medicine Sleep Medicine

## 2015-11-28 NOTE — Consult Note (Signed)
Consultation Note Date: 11/28/2015   Patient Name: Darryl Hatfield  DOB: Jun 18, 1960  MRN: [id number]  Age / Sex: 55 y.o., male  PCP: Marinda Elk, MD Referring Physician: Lytle Butte, MD  Reason for Consultation: Establishing goals of care  HPI/Patient Profile: 55 y.o. male  with past medical history of lung cancer with metastatic spread, COPD, CHF, Seizures, and hypothyroidism who was admitted on 11/27/2015 with SOB, epistaxis and hemoptysis.  [name] has had multiple hospitalizations recently 9/22 - 10/4 and [facility], 10/14 - 10/15 and 11/1 - 11/5 here at [facility].  He asks, "am I ever going to live outside of the hospital?".  He had a PE and is on Lovenox which could lend itself to bleeding. His baseline Hgb appears to be 11-12.  He was treated for symptomatic anemia on 10/14 when he received 2 units of blood. Hgb was 14.00 on 10/15 and has steadily trended down to 9.8 (MCV is normocytic). CXR on admission does not show PNA or acute changes. He was discharged on 11/5 on home oxygen, 2L.  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  Clinical Assessment and Goals of Care: Mr. Ledo lives at home with his wife (who works full time).  He has 2 children (23 and 24).  He quickly asks me "what I think his chances are".  I deferred the question for now.  We discussed his parents.  His father died of metastatic cancer at age 50 ([name] current age) and his mother died of COPD on the vent approximately 5 years ago.  [name] quickly tells me that he would not want that for himself, nor would he want to put his family thru that.  We talked for a while.  The last two years since his diagnosis have been very difficult for him.  He feels he is declining and is frustrated that he can't stay well long enough to start  chemotherapy.  He mentions he was told that with no treatment he probably only had 3 months.  We discussed code status.  He requested full code.  Then we talked about the very small percentage of people with significant co-morbidities who survive a code and what their quality of life is afterward.  He listened.  I gave him a "hard choices" booklet to read and consider.  I told him I would return tomorrow after the pulmonologist and oncologist have seen him.  I attempted to call his wife, but got no answer.  Primary Decision Maker:  PATIENT    SUMMARY OF RECOMMENDATIONS    Recommend Palliative Services follow him at home.  Code Status/Advance Care Planning:  Full code.  Will continue to discuss this with him tomorrow.    Symptom Management:   Agree with morphine PRN.  Wonder whether a PRN anxiety medicine would benefit him as well.   Psycho-social/Spiritual:   Desire for further Chaplaincy support:no  Additional Recommendations: Caregiving  Support/Resources  and Organ Donation Support  Prognosis:   Unable to determine.  Defer to Oncology.  Discharge Planning: Home with Palliative Services      Primary Diagnoses: Present on Admission: . SOB (shortness of breath)   I have reviewed the medical record, interviewed the patient and family, and examined the patient. The following aspects are pertinent.  Past Medical History:  Diagnosis Date  . CHF (congestive heart failure) ([place])   . COPD (chronic obstructive pulmonary disease) ([place])   . Hypothyroidism   . Lung cancer ([place]) 2015   RIght   . Seizures [facility])    Social History   Social History  . Marital status: Married    Spouse name: N/A  . Number of children: N/A  . Years of education: N/A   Social History Main Topics  . Smoking status: Former Smoker    Packs/day: 1.00    Years: 40.00    Quit date: 06/20/2013  . Smokeless tobacco: Never Used  . Alcohol use No  . Drug use: No  . Sexual activity: Not Asked    Other Topics Concern  . None   Social History Narrative  . None   Family History  Problem Relation Age of Onset  . Hypertension Other   . Heart failure Other   . Cancer Father    Scheduled Meds: . aspirin EC  81 mg Oral Daily  . atorvastatin  80 mg Oral Daily  . calcium-vitamin D  1 tablet Oral BID  . DULoxetine  60 mg Oral Daily  . [START ON 11/29/2015] fentaNYL  12.5 mcg Transdermal Q72H  . folic acid  1 mg Oral Daily  . gabapentin  100 mg Oral TID  . Influenza vac split quadrivalent PF  0.5 mL Intramuscular Tomorrow-1000  . levalbuterol  1.25 mg Nebulization Q6H  . levofloxacin (LEVAQUIN) IV  500 mg Intravenous Q24H  . levothyroxine  44 mcg Oral QAC breakfast  . methylPREDNISolone (SOLU-MEDROL) injection  60 mg Intravenous Q6H  . metoprolol succinate  25 mg Oral BID  . mometasone-formoterol  2 puff Inhalation BID  . pantoprazole  40 mg Oral Daily  . QUEtiapine  50 mg Oral Daily  . sodium chloride flush  3 mL Intravenous Q12H  . sodium chloride flush  3 mL Intravenous Q12H   Continuous Infusions: PRN Meds:.sodium chloride, acetaminophen **OR** acetaminophen, acetaminophen, hydrOXYzine, LORazepam, metoCLOPramide, morphine injection, nitroGLYCERIN, ondansetron, oxyCODONE, sodium chloride flush Allergies  Allergen Reactions  . No Known Allergies    Review of Systems:  Patient reports he is SOB and chest pain.  Complains of nose bleed and coughing up blood.  He denies dysuria, HA, changes in vision, anorexia, dysphagia.    Physical Exam  Constitutional: He appears well-developed and well-nourished. He is cooperative. No distress. Nasal cannula in place.  HENT:  Head: Normocephalic and atraumatic.  Eyes: Pupils are equal, round, and reactive to light.  Neck: Normal range of motion. Neck supple.  Cardiovascular:  Tachycardic no m/r/g  Pulmonary/Chest: Effort normal and breath sounds normal.  On 2L  Abdominal: Soft. He exhibits no distension. There is no tenderness.    Neurological: He is alert.  Skin: Skin is warm and dry. He is not diaphoretic.  Psychiatric: He has a normal mood and affect.  Appears slightly anxious    Vital Signs: BP 123/74 (BP Location: Left Arm)   Pulse (!) 103   Temp 98 F (36.7 C) (Oral)   Resp 20   Ht '5\' 8"'$  (1.727 m)  Wt 60.7 kg (133 lb 14.4 oz)   SpO2 91%   BMI 20.36 kg/m  Pain Assessment: 0-10   Pain Score: 8    SpO2: SpO2: 91 % O2 Device:SpO2: 91 % O2 Flow Rate: .O2 Flow Rate (L/min): 4 L/min  IO: Intake/output summary:  Intake/Output Summary (Last 24 hours) at 11/28/15 1219 Last data filed at 11/28/15 0600  Gross per 24 hour  Intake              100 ml  Output              700 ml  Net             -600 ml    LBM: Last BM Date: 11/27/15 Baseline Weight: Weight: 62.1 kg (137 lb) Most recent weight: Weight: 60.7 kg (133 lb 14.4 oz)     Palliative Assessment/Data:   Flowsheet Rows   Flowsheet Row Most Recent Value  Intake Tab  Referral Department  Hospitalist  Unit at Time of Referral  Cardiac/Telemetry Unit  Palliative Care Primary Diagnosis  Cancer  Date Notified  11/28/15  Palliative Care Type  New Palliative care  Reason for referral  Clarify Goals of Care  Date of Admission  11/27/15  # of days IP prior to Palliative referral  1  Clinical Assessment  Palliative Performance Scale Score  50%  Psychosocial & Spiritual Assessment  Palliative Care Outcomes  Patient/Family meeting held?  Yes  Who was at the meeting?  patient  Palliative Care Outcomes  Clarified goals of care, Provided psychosocial or spiritual support      Time In: 11:00   Time Out: 12:10 Time Total: 70 min. Greater than 50%  of this time was spent counseling and coordinating care related to the above assessment and plan.  Signed by: Imogene Burn, PA-C Palliative Medicine Pager: [phone]  Please contact Palliative Medicine Team phone at [phone] for questions and concerns.  For individual provider: See  Shea Evans

## 2015-11-29 ENCOUNTER — Telehealth: Payer: Self-pay | Admitting: *Deleted

## 2015-11-29 DIAGNOSIS — Z66 Do not resuscitate: Secondary | ICD-10-CM

## 2015-11-29 DIAGNOSIS — R0789 Other chest pain: Secondary | ICD-10-CM

## 2015-11-29 MED ORDER — LORAZEPAM 0.5 MG PO TABS
0.5000 mg | ORAL_TABLET | Freq: Every day | ORAL | Status: DC
Start: 2015-11-29 — End: 2015-12-05
  Administered 2015-11-30 – 2015-12-04 (×5): 0.5 mg via ORAL
  Filled 2015-11-29 (×5): qty 1

## 2015-11-29 MED ORDER — OXYCODONE HCL 5 MG PO TABS: 5.0000 mg | ORAL_TABLET | Freq: Four times a day (QID) | ORAL | Status: DC

## 2015-11-29 MED ORDER — ACETYLCYSTEINE 10 % IN SOLN
4.0000 mL | Freq: Two times a day (BID) | RESPIRATORY_TRACT | Status: DC
  Filled 2015-11-29: qty 4

## 2015-11-29 MED ORDER — MORPHINE SULFATE (PF) 4 MG/ML IV SOLN
1.0000 mg | INTRAVENOUS | Status: DC | PRN
  Administered 2015-11-29 – 2015-12-01 (×9): 2 mg via INTRAVENOUS
  Filled 2015-11-29 (×9): qty 1

## 2015-11-29 MED ORDER — OXYCODONE HCL 5 MG PO TABS
10.0000 mg | ORAL_TABLET | Freq: Four times a day (QID) | ORAL | Status: DC
  Administered 2015-11-29 – 2015-12-05 (×21): 10 mg via ORAL
  Filled 2015-11-29 (×20): qty 2

## 2015-11-29 MED ORDER — IOPAMIDOL (ISOVUE-300) INJECTION 61%: 15.0000 mL | INTRAVENOUS | Status: DC

## 2015-11-29 MED ORDER — ACETYLCYSTEINE 20 % IN SOLN
4.0000 mL | Freq: Two times a day (BID) | RESPIRATORY_TRACT | Status: DC
  Filled 2015-11-29 (×2): qty 4

## 2015-11-29 MED ORDER — ACETYLCYSTEINE 20 % IN SOLN
2.0000 mL | Freq: Two times a day (BID) | RESPIRATORY_TRACT | Status: DC
  Administered 2015-11-29 – 2015-12-02 (×5): 2 mL via RESPIRATORY_TRACT
  Administered 2015-12-03 – 2015-12-04 (×2): 4 mL via RESPIRATORY_TRACT
  Administered 2015-12-05: 2 mL via RESPIRATORY_TRACT
  Filled 2015-11-29 (×10): qty 4

## 2015-11-29 MED ORDER — LEVOFLOXACIN 500 MG PO TABS
500.0000 mg | ORAL_TABLET | ORAL | Status: AC
  Administered 2015-11-29 – 2015-12-01 (×3): 500 mg via ORAL
  Filled 2015-11-29 (×3): qty 1

## 2015-11-29 NOTE — Telephone Encounter (Signed)
Darryl Hatfield called to see what pt was talking about. Darryl Hatfield said that Darryl Hatfield told him that she was going to talk to pulmonologist and decide what to do with him. He is currently on 4 liters of oxygen.  Darryl Hatfield to tell her that dr. Humphrey Rolls and Dr. Mike Gip spoke and he has increased his steroids to see if that with help the lungs and  He will give him to Friday and see if it helps or then maybe look into bronch if not.  She understands.

## 2015-11-29 NOTE — Plan of Care (Signed)
Problem: Pain Managment: Goal: General experience of comfort will improve Outcome: Not Progressing Pt still rating pain 8/10 despite receiving scheduled 10 mg Oxycodone, and 2 mg of Morphine every 4 hours.

## 2015-11-29 NOTE — Care Management (Signed)
Patient is now a DNR.  Spoke with him about home health agency.  It is reported that hospice is being considered.,  Discussed [facility] with patient. If patient pursues chemo this agency may better meet his home health needs and could easily be transitioned to hospice home care if needed.  Gave heads up referral to [place] with palliative and updated  [facility].  Patient says he and his wife are meeting with oncology later today "to decide" some things.

## 2015-11-29 NOTE — Progress Notes (Signed)
[facility] [place] NAME: Darryl Hatfield    MRN#:  [id number]  DATE OF BIRTH:  1960-05-29  SUBJECTIVE:  Hospital Day: 2 days Darryl Hatfield is a 54 y.o. male presenting with Chest Pain .   Overnight events: No acute overnight events Interval Events: States breathing better this morning, on 4 L oxygen  REVIEW OF SYSTEMS:  CONSTITUTIONAL: No fever, fatigue or weakness.  EYES: No blurred or double vision.  EARS, NOSE, AND THROAT: No tinnitus or ear pain.  RESPIRATORY: Positive cough, shortness of breath, denies wheezing or hemoptysis.  CARDIOVASCULAR: No chest pain, orthopnea, edema.  GASTROINTESTINAL: No nausea, vomiting, diarrhea or abdominal pain.  GENITOURINARY: No dysuria, hematuria.  ENDOCRINE: No polyuria, nocturia,  HEMATOLOGY: No anemia, easy bruising or bleeding SKIN: No rash or lesion. MUSCULOSKELETAL: No joint pain or arthritis.   NEUROLOGIC: No tingling, numbness, weakness.  PSYCHIATRY: No anxiety or depression.   DRUG ALLERGIES:   Allergies  Allergen Reactions  . No Known Allergies     VITALS:  Blood pressure 134/78, pulse 95, temperature 97.8 F (36.6 C), temperature source Oral, resp. rate (!) 22, height '5\' 8"'$  (1.727 m), weight 133 lb 14.4 oz (60.7 kg), SpO2 96 %.  PHYSICAL EXAMINATION:  VITAL SIGNS: Vitals:   11/29/15 0922 11/29/15 1143  BP:  134/78  Pulse:  95  Resp:  (!) 22  Temp: 97.6 F (36.4 C) 97.8 F (36.6 C)   GENERAL:55 y.o.male currently in no acute distress.  HEAD: Normocephalic, atraumatic.  EYES: Pupils equal, round, reactive to light. Extraocular muscles intact. No scleral icterus.  MOUTH: Moist mucosal membrane. Dentition intact. No abscess noted.  EAR, NOSE, THROAT: Clear without exudates. No external lesions.  NECK: Supple. No thyromegaly. No nodules. No JVD.  PULMONARY: Coarse rhonchi bilateral without wheeze rails or rhonci. No use of accessory muscles, Good respiratory effort.  good air entry bilaterally CHEST: Nontender to palpation.  CARDIOVASCULAR: S1 and S2. Regular rate and rhythm. No murmurs, rubs, or gallops. No edema. Pedal pulses 2+ bilaterally.  GASTROINTESTINAL: Soft, nontender, nondistended. No masses. Positive bowel sounds. No hepatosplenomegaly.  MUSCULOSKELETAL: No swelling, clubbing, or edema. Range of motion full in all extremities.  NEUROLOGIC: Cranial nerves II through XII are intact. No gross focal neurological deficits. Sensation intact. Reflexes intact.  SKIN: No ulceration, lesions, rashes, or cyanosis. Skin warm and dry. Turgor intact.  PSYCHIATRIC: Mood, affect within normal limits. The patient is awake, alert and oriented x 3. Insight, judgment intact.      LABORATORY PANEL:   CBC  Recent Labs Lab 11/28/15 0342  WBC 5.0  HGB 9.8*  HCT 30.4*  PLT 92*   ------------------------------------------------------------------------------------------------------------------  Chemistries   Recent Labs Lab 11/28/15 0342  NA 139  K 4.6  CL 97*  CO2 33*  GLUCOSE 144*  BUN 20  CREATININE 1.17  CALCIUM 9.3   ------------------------------------------------------------------------------------------------------------------  Cardiac Enzymes  Recent Labs Lab 11/27/15 1257  TROPONINI <0.03   ------------------------------------------------------------------------------------------------------------------  RADIOLOGY:  Ct Angio Chest Pe W And/or Wo Contrast  Result Date: 11/27/2015 CLINICAL DATA:  Hospitalized for pneumonia. Discharged yesterday. Coughing up blood while on the CT table. Hx bilateral lung cancer. Mid chest pain and nose bleeds today. Chest pain is severe. Pt has a port. EXAM: CT ANGIOGRAPHY CHEST WITH CONTRAST TECHNIQUE: Multidetector CT imaging of the chest was performed using the standard protocol during bolus administration of intravenous contrast. Multiplanar CT image reconstructions and MIPs were obtained to  evaluate the  vascular anatomy. CONTRAST:  75 mL of Isovue 370 intravenous contrast COMPARISON:  11/04/2015 and multiple prior chest CTs. FINDINGS: Cardiovascular: Satisfactory opacification of the pulmonary arteries to the segmental level. No evidence of pulmonary embolism. Normal heart size. No pericardial effusion. Minor coronary artery calcifications. Great vessels normal in caliber. No aortic dissection. No significant aortic atherosclerosis. Mediastinum/Nodes: 1 cm short axis left supraclavicular node, stable from the most recent prior chest CT. Prominent right axillary and subpectoral lymph nodes are also stable. Multiple prominent to mildly enlarged mediastinal lymph nodes. Reference measurement of a prevascular node near the AP window this 12 mm in short axis, also unchanged from most recent prior study. No new mediastinal adenopathy. Abnormal soft tissue surrounds the right hilar structures, without change from the most recent prior exam. Mildly enlarged left hilar lymph node measures 12 mm short axis, stable. Lungs/Pleura: Focal opacity extends from the right hilum into the right upper lobe consistent with treatment related scarring, stable from the most recent prior exam. Residual right hilar/perihilar tumor is possible. 6.7 mm nodule in the right lower lobe, image 80, series 6, without significant change from the most recent prior study. 4 mm nodule on the left lower lobe, image 70, series 6, also without significant change from the most recent prior exam. No other discrete pulmonary nodules. Changes of moderate centrilobular emphysema are stable. There is mild interstitial thickening and bronchial wall thickening in the lower lobes greatest on the right. Minimal pleural effusions. No pneumothorax. Upper Abdomen: No acute findings. Bilateral adrenal masses stable from the prior exam. Musculoskeletal: No osteoblastic or osteolytic lesions. Review of the MIP images confirms the above findings. IMPRESSION:  1. No evidence of a pulmonary embolus. No acute findings hemi chest. 2. Findings consistent with metastatic lung carcinoma with left supraclavicular, right axillary and subpectoral and mediastinal adenopathy, single mildly enlarged left hilar lymph node and abnormal soft tissue along the right hilum and central right upper lobe. Small nodules, 1 in the right lower lobe with another in the left lower lobe, both stable from the most recent prior exam. Bilateral adrenal masses also consistent with metastatic disease. Electronically Signed   By: Lajean Manes M.D.   On: 11/27/2015 16:01    EKG:   Orders placed or performed during the hospital encounter of 11/27/15  . ED EKG within 10 minutes  . ED EKG within 10 minutes    ASSESSMENT AND PLAN:   Darryl Hatfield is a 55 y.o. male presenting with Chest Pain . Admitted 11/27/2015 : Day #: 2 days 1. Acute on chronic hypoxic respiratory failure: Multifactorial setting of lung cancer, COPD Continue with levaquin Steroid taper Continue breathing treatments, oxygen  2. Metastatic lung cancer seen by oncology, prognosis very poor,  3. Hemoptysis: Small amount either related to nosebleed from oxygen versus bronchitis versus cancer pulmonary saw the patient yesterday but no note unclear if bronchoscopy will change his current condition 4. Hyperlipidemia unspecified: Statin therapy   Patient seen by palliative care team and as made DO NOT RESUSCITATE. He understands his prognosis is very poor and this is acceptable to [facility] with hospice  All the records are reviewed and case discussed with Care Management/Social Workerr. Management plans discussed with the patient, family and they are in agreement.  CODE STATUS: dnr TOTAL TIME TAKING CARE OF THIS PATIENT:  46mn .   POSSIBLE D/C IN 1-2 DAYS, DEPENDING ON CLINICAL CONDITION.   PDustin FlockM.D on 11/29/2015 at 3:11 PM  Between 7am to  6pm - Pager - [phone]  After 6pm: House Pager: -  [phone]  [facility]  Office  [phone]  CC: Primary care physician; Marinda Elk, MD

## 2015-11-29 NOTE — Progress Notes (Signed)
Daily Progress Note   Patient Name: Darryl Hatfield       Date: 11/29/2015 DOB: 1960-04-10  Age: 55 y.o. MRN#: [id number] Attending Physician: Dustin Flock, MD Primary Care Physician: Marinda Elk, MD Admit Date: 11/27/2015  Reason for Consultation/Follow-up: Establishing goals of care  Subjective: Darryl Hatfield's biggest goals are (1) not to be a burden to anyone and (2) not to suffer.  In order to help achieve these goals he has chosen to Allow Natural Death and not to be intubated or undergo CPR.  He is tired of coming to the hospital so frequently and would like to look for ways to avoid coming back to the hospital.  He would like to have a nice Thanksgiving and Christmas at home with his family.  We discussed support at home.  He's anxious about being alone and not having enough oxygen.  His wife has just started a new job and his son is around the home but according to Darryl Hatfield his son is unable to help.   We talked about the possibility of home health aides (these may be cost prohibitive), we discussed Hospice services - if he opts not to take chemo he is eligible for hospice.  We talked about the possibility of [facility] as he has been there before.  I'm uncertain if he would qualify for rehab.  Darryl Hatfield asked about [name] as a place to go to get stabilized.  I explained it was primarily for the last 2-3 weeks of life.  He is interested in going to [name] at end of life.  Length of Stay: 2  Current Medications: Scheduled Meds:  . aspirin EC  81 mg Oral Daily  . atorvastatin  80 mg Oral Daily  . calcium-vitamin D  1 tablet Oral BID  . DULoxetine  60 mg Oral Daily  . [START ON 11/30/2015] fentaNYL  12.5 mcg Transdermal Q72H  . folic acid  1 mg Oral Daily  . gabapentin   100 mg Oral TID  . guaiFENesin  600 mg Oral BID  . Influenza vac split quadrivalent PF  0.5 mL Intramuscular Tomorrow-1000  . iopamidol  15 mL Oral Q1 Hr x 2  . levalbuterol  1.25 mg Nebulization Q6H  . levofloxacin  500 mg Oral Q24H  . levothyroxine  44 mcg Oral  QAC breakfast  . methylPREDNISolone (SOLU-MEDROL) injection  40 mg Intravenous Q6H  . metoprolol succinate  25 mg Oral BID  . mometasone-formoterol  2 puff Inhalation BID  . oxyCODONE  10 mg Oral Q6H  . pantoprazole  40 mg Oral Daily  . QUEtiapine  50 mg Oral Daily  . sodium chloride flush  3 mL Intravenous Q12H  . sodium chloride flush  3 mL Intravenous Q12H  . tiotropium  18 mcg Inhalation Daily    Continuous Infusions:   PRN Meds: sodium chloride, acetaminophen **OR** acetaminophen, hydrOXYzine, LORazepam, metoCLOPramide, morphine injection, nitroGLYCERIN, ondansetron, sodium chloride flush  Physical Exam        Wd, Thin, A&O, appropriate CV rrr, no m/r/g Resp on 4L n/c, no increased work of breathing, non productive cough Abdomen soft, nt, nd Extremities 5/5 strength in each, no edema   Vital Signs: BP (!) 148/84 (BP Location: Left Arm)   Pulse 83   Temp 97.5 F (36.4 C) (Oral)   Resp 15   Ht '5\' 8"'$  (1.727 m)   Wt 60.7 kg (133 lb 14.4 oz)   SpO2 98%   BMI 20.36 kg/m  SpO2: SpO2: 98 % O2 Device: O2 Device: Nasal Cannula O2 Flow Rate: O2 Flow Rate (L/min): 4 L/min  Intake/output summary:  Intake/Output Summary (Last 24 hours) at 11/29/15 0916 Last data filed at 11/28/15 2140  Gross per 24 hour  Intake              480 ml  Output              900 ml  Net             -420 ml   LBM: Last BM Date: 11/27/15 Baseline Weight: Weight: 62.1 kg (137 lb) Most recent weight: Weight: 60.7 kg (133 lb 14.4 oz)       Palliative Assessment/Data:    Flowsheet Rows   Flowsheet Row Most Recent Value  Intake Tab  Referral Department  Hospitalist  Unit at Time of Referral  Cardiac/Telemetry Unit  Palliative Care  Primary Diagnosis  Cancer  Date Notified  11/28/15  Palliative Care Type  New Palliative care  Reason for referral  Clarify Goals of Care  Date of Admission  11/27/15  # of days IP prior to Palliative referral  1  Clinical Assessment  Palliative Performance Scale Score  50%  Psychosocial & Spiritual Assessment  Palliative Care Outcomes  Patient/Family meeting held?  Yes  Who was at the meeting?  patient  Palliative Care Outcomes  Changed CPR status      Patient Active Problem List   Diagnosis Date Noted  . Primary malignant neoplasm of lung metastatic to other site [facility])   . Hemoptysis   . SOB (shortness of breath) 11/27/2015  . Acute respiratory failure with hypoxemia ([place]) 11/22/2015  . Bone metastasis ([place]) 11/17/2015  . Symptomatic anemia 11/04/2015  . Chest pain 11/04/2015  . Tachycardia 11/04/2015  . Pulmonary embolism without acute cor pulmonale ([place]) 10/07/2015  . Acute deep vein thrombosis (DVT) of femoral vein of both lower extremities ([place]) 10/07/2015  . Colitis 10/07/2015  . Dehydration 09/28/2015  . Epigastric discomfort 09/16/2015  . STEMI (ST elevation myocardial infarction) ([place]) 09/16/2015  . Pruritus 07/27/2015  . Constipation 06/05/2015  . Anxiety 05/12/2015  . Weight loss 05/12/2015  . 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 Adenocarcinoma of lung, stage 4 ([employer]) 04/25/2015  . Iron deficiency anemia 04/05/2015  . Episodes of formed  visual hallucinations 12/26/2014  . Dementia with behavioral disturbance 12/26/2014  . Delirium 12/25/2014  . Cancer of upper lobe of right lung ([place]) 07/02/2013  . SCIATICA 03/01/2010    Palliative Care Assessment & Plan   Patient Profile: 55 y.o. male  with past medical history of lung cancer with metastatic spread, COPD, CHF, seizures, and hypothyroidism who was admitted on 11/27/2015 with SOB, epistaxis and hemoptysis.  Darryl Hatfield has had multiple hospitalizations recently 9/22 - 10/4 and [facility], 10/14 - 10/15  and 11/1 - 11/5 here at [facility].  He asks, "am I ever going to live outside of the hospital?".  He had a PE and is on Lovenox which could lend itself to bleeding. His baseline Hgb appears to be 11-12.  He was treated for symptomatic anemia on 10/14 when he received 2 units of blood. Hgb was 14.00 on 10/15 and has steadily trended down to 9.8 (MCV is normocytic). CXR on admission does not show PNA or acute changes. He was discharged on 11/5 on home oxygen, 2L.  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   Assessment: Very pleasant 55 yo male with advancing lung cancer and COPD.  Now DNR.  He is trying to make decisions about treatment vs hospice and quality of life.  Recommendations/Plan:  PMT will follow supportively.  Increased and scheduled oxycodone for chest pain.  Continue PRN morphine.    Would continue steroids and consider decadron for chest pain, but will defer to oncology  Need to determine a regimen of symptom control that will keep him comfortable outside of the hospital.  Will schedule 1 dose of ativan QHS to encourage sleep and relieve anxiety.  Goals of Care and Additional Recommendations:  Limitations on Scope of Treatment: Avoid Hospitalization and Minimize Medications  Code Status:  DNR  Prognosis:   < 6 months given recurrent hospitalizations advancing lung cancer and COPD.  If he decides against chemo/radiation, he is hospice eligible.  Discharge Planning:  To Be Determined  Care plan was discussed with the patient and primary attending MD.  Thank you for allowing the Palliative Medicine Team to assist in the care of this patient.   Time In: 9:00 Time Out: 9:35 Total Time 35 Prolonged Time Billed no      Greater than 50%  of this time was spent counseling and coordinating care related to the above assessment and plan.  Imogene Burn, PA-C Palliative  Medicine   Please contact Palliative MedicineTeam phone at [phone] for questions and concerns.   Please see AMION for individual provider pager numbers.

## 2015-11-29 NOTE — Progress Notes (Signed)
Updated patient on plan of care, given report to [place], RN

## 2015-11-29 NOTE — Consult Note (Signed)
[facility]  Date of admission:  11/27/2015  Inpatient day:   11/28/2015  Consulting physician: Dr Dustin Flock  Reason for Consultation:  Lung cancer prognosis.  Chief Complaint: Darryl Hatfield is a 55 y.o. 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  HPI:  The patient was diagnosed with stage IIIA squamous cell lung cancerin 07/02/2013. He received concurrent chemotherapy and radiation.   PET scan on 04/12/2015 revealed progressive disease.CT guided right adrenal biopsy on 04/25/2015 revealed metastatic adenocarcinoma of the lung. He received 5cycles of carboplatin and Alimta(05/25/2015 - 08/17/2015).   PET scanon 09/06/2015 revealed interval progressive disease. He received 1 cycle of nivolumab (09/14/2015).  Course was complicated by colitis. He is on a prednisone taper (current dose 10 mg a day).  He was admitted to [facility] from 09/17/2015 - 09/26/2015 with an NSTEMI.  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 There was 50% mid LAD and mid RCA lesion.  Chest CT angiogram on 09/21/2015 confirmed bilateral pulmonary emboli. Bilateral lower extremity duplex on 09/22/2015 revealed bilateral femoral DVTs.  An IVC filter was placed.  He is on Lovenox .  He was admitted to [facility] from 10/13/2015 -  10/25/2015 secondary to a flare of his colitis.  He was admitted to [facility] from 11/04/2015 - 11/05/2015 with generalized abdominal pain and chest pain.  Hemoglobin was 7.6.  He was transfused with 2 units of PRBCs to a hemoglobin of 14.  Chest CT angiogram on 11/04/2015 revealed no evidence of pulmonary embolism.  There was interval progression of mediastinal, right axillary/subpectoral, and left supraclavicular lymphadenopathy.   Bone scan on 11/17/2015 revealed new  areas of abnormal bony uptake in the calvarium, lower thoracic and lower lumbar spine, multiple ribs bilaterally concerning for bony metastatic disease.  He was scheduled to begin chemotherapy (Taxotere) on 11/22/2015.  He was admitted to [facility] from 11/22/2015 - 11/26/2015 with dehydration and acute hypoxic respiratory failure felt to progression of lung cancer with a possible superimposed pneumonia.  He was treated with Rocephin and azithromycin and discharged with azithromycin for 6 days. He was treated with oxygen and discharged on 2 L/min via nasal cannula (new for patient). Prerenal azotemia improved with fluids.  He was short of breath walking into his house from the car after discahrage. He was on oxygen 2 liter/min.  He had a couple of episodes of nosebleeds. He states that his "oxygen level was dropping" on O2 sat monitor.  He increased his oxygen to 3 liters/min.  He "lost his air" and thus returned to the hospital.  Chest CT angiogram on 11/27/2015 revealed no evidence of a pulmonary embolus and no acute findings in the chest.  Findings were consistent with metastatic lung carcinoma with left supraclavicular, right axillary and subpectoral and mediastinal adenopathy, single mildly enlarged left hilar lymph node and abnormal soft tissue along the right hilum and central right upper lobe. Small nodules, 1 in the right lower lobe with another in the left lower lobe, both stable from the most recent prior exam.  There were bilateral adrenal masses also consistent with metastatic disease.  Symptomatically, he notes that he is breathing "a little better".   Past Medical History:  Diagnosis Date  . CHF (congestive heart failure) ([place])   . COPD (chronic obstructive pulmonary disease) ([place])   . Hypothyroidism   .  Lung cancer ([place]) 2015   RIght   . Seizures ([place])     Past Surgical History:  Procedure Laterality Date  . BACK SURGERY    . COLONOSCOPY  10/2015  . PORTACATH PLACEMENT Right      Family History  Problem Relation Age of Onset  . Hypertension Other   . Heart failure Other   . Cancer Father     Social History:  reports that he quit smoking about 2 years ago. He has a 40.00 pack-year smoking history. He has never used smokeless tobacco. He reports that he does not drink alcohol or use drugs.  His wife's name is [name].  She works at [employer].  He is alone today.  Allergies:  Allergies  Allergen Reactions  . No Known Allergies     Medications Prior to Admission  Medication Sig Dispense Refill  . aspirin 81 MG EC tablet Take 81 mg by mouth daily.   0  . Calcium Carbonate-Vitamin D 600-400 MG-UNIT tablet Take 1 tablet by mouth 2 (two) times daily.     . DULoxetine (CYMBALTA) 30 MG capsule Take 60 mg by mouth daily.     . fentaNYL (DURAGESIC - DOSED MCG/HR) 12 MCG/HR Place 1 patch (12.5 mcg total) onto the skin every 3 (three) days. 5 patch 0  . folic acid (FOLVITE) 1 MG tablet TAKE 1 TABLET BY MOUTH DAILY START 5-7 DAYS BEFORE ALIMTA CHEMO, CONTINUE UNTIL 21 DAYS AFTER ALIMTA 100 tablet 3  . gabapentin (NEURONTIN) 100 MG capsule Take 1 capsule (100 mg total) by mouth 3 (three) times daily. 90 capsule 4  . hydrOXYzine (ATARAX/VISTARIL) 10 MG tablet Take 1 tablet (10 mg total) by mouth 3 (three) times daily as needed for itching. 60 tablet 1  . levothyroxine (SYNTHROID, LEVOTHROID) 88 MCG tablet Take 0.5 tablets (44 mcg total) by mouth daily before breakfast. 30 tablet 0  . LORazepam (ATIVAN) 0.5 MG tablet Take 1 tablet (0.5 mg total) by mouth every 8 (eight) hours as needed for anxiety. 30 tablet 0  . metoCLOPramide (REGLAN) 10 MG tablet Take 1 tablet (10 mg total) by mouth every 6 (six) hours as needed. 30 tablet 0  . metoprolol succinate (TOPROL-XL) 25 MG 24 hr tablet Take 25 mg by mouth 2 (two) times daily.   2  . oxyCODONE (OXY IR/ROXICODONE) 5 MG immediate release tablet 1 tablet every 3 - 4 hours as needed for pain 40 tablet 0  . QUEtiapine (SEROQUEL) 50 MG tablet  Take 50 mg by mouth daily.     Marland Kitchen acetaminophen (TYLENOL) 325 MG tablet Take 650 mg by mouth every 6 (six) hours as needed for mild pain.     Marland Kitchen albuterol (PROVENTIL) (2.5 MG/3ML) 0.083% nebulizer solution Take 3 mLs (2.5 mg total) by nebulization every 6 (six) hours as needed for wheezing or shortness of breath. (Patient taking differently: Take 2.5 mg by nebulization 2 (two) times daily. ) 75 mL 2  . atorvastatin (LIPITOR) 80 MG tablet Take 80 mg by mouth daily.     . mometasone-formoterol (DULERA) 100-5 MCG/ACT AERO Inhale 2 puffs into the lungs 2 (two) times daily. (Patient not taking: Reported on 11/27/2015) 0.1 g 0  . nitroGLYCERIN (NITROSTAT) 0.4 MG SL tablet Place 0.4 mg under the tongue every 5 (five) minutes as needed.   0  . ondansetron (ZOFRAN) 8 MG tablet Take 1 tablet (8 mg total) by mouth every 8 (eight) hours as needed for nausea or vomiting. 30 tablet 3  .  pantoprazole (PROTONIX) 40 MG tablet Take 40 mg by mouth daily.     . predniSONE (DELTASONE) 10 MG tablet Take 1 tablet (10 mg total) by mouth daily with breakfast. 60 mg PO (ORAL) x 2 days 50 mg PO (ORAL)  x 2 days 40 mg PO (ORAL)  x 2 days 30 mg PO  (ORAL)  x 2 days 20 mg PO  (ORAL) x 2 days 10 mg PO  (ORAL) x 2 days then stop (Patient not taking: Reported on 11/27/2015) 42 tablet 0    Review of Systems: GENERAL:  Feels "bad".  No fevers, chills, or sweats.  PERFORMANCE STATUS (ECOG):  2 HEENT:  No visual changes, runny nose, sore throat, mouth sores or tenderness. Lungs:  Shortness of breath, improved on 4 liters/min [place]. Cough. No hemoptysis. Cardiac:  No chest pain, palpitations, orthopnea, or PND. GI:  No nausea, vomiting, constipation, diarrhea, melena or hematochezia.  GU:  No urgency, frequency, dysuria, or hematuria. Musculoskeletal:  Right sided rib pain (chronic).  Back pain.  No muscle tenderness. Extremities:  No swelling or pain. Skin:  Bruising on arms and abdomen.  No rashes. Neuro:  No headache, numbness or  weakness, balance or coordination issues. Endocrine:  No diabetes. Thyroid disease on Synthroid.  No hot flashes or night sweats. Psych:  Anxiety.  No mood changes or depression. Pain: Pain as noted above. Review of systems:  All other systems reviewed and found to be negative.  Physical Exam:  Blood pressure 123/75, pulse (!) 102, temperature 97.4 F (36.3 C), temperature source Oral, resp. rate 15, height 5' 8"  (1.727 m), weight 133 lb 14.4 oz (60.7 kg), SpO2 93 %.  GENERAL:  Thin gentleman sitting up on the medical unit in no acute distress.  MENTAL STATUS:  Alert and oriented to person, place and time. HEAD:  Slightly disheveled.  Black hair with slight graying.  Mustache. Normocephalic, atraumatic, face symmetric, no Cushingoid features. EYES:  Glasses.  Blue eyes.  Pupils equal round and reactive to light and accomodation.  No conjunctivitis or scleral icterus. ENT:  [place] in place.  Oropharynx clear without lesion.  Dentures.  Tongue normal. Mucous membranes moist.  RESPIRATORY:  Scattered soft bilateral rhonchi.  No wheezing. CARDIOVASCULAR:  Regular rate and rhythm without murmur, rub or gallop. ABDOMEN:  Soft, non-tender with active bowel sounds and no hepatosplenomegaly.  No masses. SKIN: Ecchymosis on upper extremities (left > right).  EXTREMITIES:  Thin arms.  No lower extremity edema.  No skin discoloration or tenderness.  No palpable cords. LYMPH NODES: No palpable cervical, supraclavicular, axillary or inguinal adenopathy.  NEUROLOGICAL: Unremarkable. PSYCH:  Anxious.    Results for orders placed or performed during the hospital encounter of 11/27/15 (from the past 48 hour(s))  Basic metabolic panel     Status: Abnormal   Collection Time: 11/27/15 12:57 PM  Result Value Ref Range   Sodium 136 135 - 145 mmol/L   Potassium 4.2 3.5 - 5.1 mmol/L   Chloride 93 (L) 101 - 111 mmol/L   CO2 32 22 - 32 mmol/L   Glucose, Bld 81 65 - 99 mg/dL   BUN 21 (H) 6 - 20 mg/dL   Creatinine,  Ser 1.09 0.61 - 1.24 mg/dL   Calcium 10.2 8.9 - 10.3 mg/dL   GFR calc non Af Amer >60 >60 mL/min   GFR calc Af Amer >60 >60 mL/min    Comment: (NOTE) The eGFR has been calculated using the CKD EPI equation. This calculation has  not been validated in all clinical situations. eGFR's persistently <60 mL/min signify possible Chronic Kidney Disease.    Anion gap 11 5 - 15  CBC     Status: Abnormal   Collection Time: 11/27/15 12:57 PM  Result Value Ref Range   WBC 8.4 3.8 - 10.6 K/uL   RBC 3.59 (L) 4.40 - 5.90 MIL/uL   Hemoglobin 10.5 (L) 13.0 - 18.0 g/dL   HCT 31.7 (L) 40.0 - 52.0 %   MCV 88.2 80.0 - 100.0 fL   MCH 29.1 26.0 - 34.0 pg   MCHC 33.0 32.0 - 36.0 g/dL   RDW 16.7 (H) 11.5 - 14.5 %   Platelets 119 (L) 150 - 440 K/uL  Troponin I     Status: None   Collection Time: 11/27/15 12:57 PM  Result Value Ref Range   Troponin I <0.03 <0.03 ng/mL  CBC     Status: Abnormal   Collection Time: 11/28/15  3:42 AM  Result Value Ref Range   WBC 5.0 3.8 - 10.6 K/uL   RBC 3.38 (L) 4.40 - 5.90 MIL/uL   Hemoglobin 9.8 (L) 13.0 - 18.0 g/dL   HCT 30.4 (L) 40.0 - 52.0 %   MCV 89.8 80.0 - 100.0 fL   MCH 29.0 26.0 - 34.0 pg   MCHC 32.3 32.0 - 36.0 g/dL   RDW 16.7 (H) 11.5 - 14.5 %   Platelets 92 (L) 150 - 440 K/uL  Basic metabolic panel     Status: Abnormal   Collection Time: 11/28/15  3:42 AM  Result Value Ref Range   Sodium 139 135 - 145 mmol/L   Potassium 4.6 3.5 - 5.1 mmol/L   Chloride 97 (L) 101 - 111 mmol/L   CO2 33 (H) 22 - 32 mmol/L   Glucose, Bld 144 (H) 65 - 99 mg/dL   BUN 20 6 - 20 mg/dL   Creatinine, Ser 1.17 0.61 - 1.24 mg/dL   Calcium 9.3 8.9 - 10.3 mg/dL   GFR calc non Af Amer >60 >60 mL/min   GFR calc Af Amer >60 >60 mL/min    Comment: (NOTE) The eGFR has been calculated using the CKD EPI equation. This calculation has not been validated in all clinical situations. eGFR's persistently <60 mL/min signify possible Chronic Kidney Disease.    Anion gap 9 5 - 15   Procalcitonin - Baseline     Status: None   Collection Time: 11/28/15  1:44 PM  Result Value Ref Range   Procalcitonin 0.16 ng/mL    Comment:        Interpretation: PCT (Procalcitonin) <= 0.5 ng/mL: Systemic infection (sepsis) is not likely. Local bacterial infection is possible. (NOTE)         ICU PCT Algorithm               Non ICU PCT Algorithm    ----------------------------     ------------------------------         PCT < 0.25 ng/mL                 PCT < 0.1 ng/mL     Stopping of antibiotics            Stopping of antibiotics       strongly encouraged.               strongly encouraged.    ----------------------------     ------------------------------       PCT level decrease by  PCT < 0.25 ng/mL       >= 80% from peak PCT       OR PCT 0.25 - 0.5 ng/mL          Stopping of antibiotics                                             encouraged.     Stopping of antibiotics           encouraged.    ----------------------------     ------------------------------       PCT level decrease by              PCT >= 0.25 ng/mL       < 80% from peak PCT        AND PCT >= 0.5 ng/mL            Continuin g antibiotics                                              encouraged.       Continuing antibiotics            encouraged.    ----------------------------     ------------------------------     PCT level increase compared          PCT > 0.5 ng/mL         with peak PCT AND          PCT >= 0.5 ng/mL             Escalation of antibiotics                                          strongly encouraged.      Escalation of antibiotics        strongly encouraged.    Dg Chest 2 View  Result Date: 11/27/2015 CLINICAL DATA:  Short of breath. EXAM: CHEST  2 VIEW COMPARISON:  11/25/2015. FINDINGS: Port-A-Cath noted with lead tip projected over the right atrium. Heart size normal. Right perihilar pleural-parenchymal thickening consistent with scarring noted. Right base pleural thickening  again noted consistent with scarring. No acute abnormality identified. Prior thoracolumbar spine fusion IMPRESSION: 1. Port-A-Cath noted with tip projected over right atrium. 2. Right perihilar pleural-parenchymal thickening consistent with scarring. Chest is stable from prior exam. Electronically Signed   By: Marcello Moores  Register   On: 11/27/2015 13:54   Ct Angio Chest Pe W And/or Wo Contrast  Result Date: 11/27/2015 CLINICAL DATA:  Hospitalized for pneumonia. Discharged yesterday. Coughing up blood while on the CT table. Hx bilateral lung cancer. Mid chest pain and nose bleeds today. Chest pain is severe. Pt has a port. EXAM: CT ANGIOGRAPHY CHEST WITH CONTRAST TECHNIQUE: Multidetector CT imaging of the chest was performed using the standard protocol during bolus administration of intravenous contrast. Multiplanar CT image reconstructions and MIPs were obtained to evaluate the vascular anatomy. CONTRAST:  75 mL of Isovue 370 intravenous contrast COMPARISON:  11/04/2015 and multiple prior chest CTs. FINDINGS: Cardiovascular: Satisfactory opacification of the pulmonary arteries to the segmental level. No evidence of pulmonary embolism. Normal heart size. No pericardial effusion.  Minor coronary artery calcifications. Great vessels normal in caliber. No aortic dissection. No significant aortic atherosclerosis. Mediastinum/Nodes: 1 cm short axis left supraclavicular node, stable from the most recent prior chest CT. Prominent right axillary and subpectoral lymph nodes are also stable. Multiple prominent to mildly enlarged mediastinal lymph nodes. Reference measurement of a prevascular node near the AP window this 12 mm in short axis, also unchanged from most recent prior study. No new mediastinal adenopathy. Abnormal soft tissue surrounds the right hilar structures, without change from the most recent prior exam. Mildly enlarged left hilar lymph node measures 12 mm short axis, stable. Lungs/Pleura: Focal opacity extends  from the right hilum into the right upper lobe consistent with treatment related scarring, stable from the most recent prior exam. Residual right hilar/perihilar tumor is possible. 6.7 mm nodule in the right lower lobe, image 80, series 6, without significant change from the most recent prior study. 4 mm nodule on the left lower lobe, image 70, series 6, also without significant change from the most recent prior exam. No other discrete pulmonary nodules. Changes of moderate centrilobular emphysema are stable. There is mild interstitial thickening and bronchial wall thickening in the lower lobes greatest on the right. Minimal pleural effusions. No pneumothorax. Upper Abdomen: No acute findings. Bilateral adrenal masses stable from the prior exam. Musculoskeletal: No osteoblastic or osteolytic lesions. Review of the MIP images confirms the above findings. IMPRESSION: 1. No evidence of a pulmonary embolus. No acute findings hemi chest. 2. Findings consistent with metastatic lung carcinoma with left supraclavicular, right axillary and subpectoral and mediastinal adenopathy, single mildly enlarged left hilar lymph node and abnormal soft tissue along the right hilum and central right upper lobe. Small nodules, 1 in the right lower lobe with another in the left lower lobe, both stable from the most recent prior exam. Bilateral adrenal masses also consistent with metastatic disease. Electronically Signed   By: Lajean Manes M.D.   On: 11/27/2015 16:01    Assessment:  The patient is a 55 y.o. 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    Chest CT angiogram on 11/27/2015 revealed no evidence of a pulmonary embolus and no acute findings in the chest.  There is no evidence of lymphangitic spread of tumor.  When reviewing current films and CT scans from earlier this summer (when  patient was not on oxygen), he has more central prominence/fullness with narrowing of airways.  Symptomatically, he notes discomfort on the right side of his chest.  Plan:   1.  Oncology:  Patient with known progressive lung cancer.  He was scheduled to begin cycle #1 Taxotere on 11/22/2015.  Chemotherapy has been postponed secondary to his admissions to the hospital.  I suspect the progressive changes in the central right chest (noted when reviewing CT scans from earlier this summer) represent progressive disease. This would be in the field of his prior radiation for squamous cell lung cancer.    His most recent biopsy from the right adrenal gland in 04/2015 was adenocarcinoma.    We have discussed Taxotere as a reasonable option.  Taxotere would be efficacious for both squamous cell lung cancer and adenocarcinoma.  As compared to best supportive care, patients receiving Taxotere have a median survival of 7.5 months vs 4.6 months (one-year survival of 37% vs 19%). Taxotere also improves pain control with less deterioration in quality  of life (QOL) compared to best supportive care.  I would like to have a conference with the patient and his wife, [name].  However she is working during the day.  He states that he wants to receive treatment.  He knows that it is not curative.  Code status would also be discussed further with his wife present.  2.  Hematology:  Patient was on Lovenox for h/o PE and DVT.  He has an IVC filter in place.  Patient had 2 episodes of epistaxis at discharge and has coughed up some blood (drainage from posterior pharynx or true hemoptysis).  Consider checking anti-Xa level 4 hours after dosing if decision made to restart Lovenox.  3.  Pulmonary:  Patient was started on oxygen on last admission.  Suspect etiology is due to progressive disease as noted above.  Could consider bronchoscopy.  Will discuss with pulmonary medicine.   Thank you for allowing me to participate in Darryl Hatfield 's care.  I will follow him closely with you while hospitalized and after discharge in the outpatient department.  I will be in [place] on 11/29/2015.   Lequita Asal, MD  11/28/2015

## 2015-11-30 LAB — PROCALCITONIN: PROCALCITONIN: 0.13 ng/mL

## 2015-11-30 MED ORDER — HALOPERIDOL LACTATE 5 MG/ML IJ SOLN
INTRAMUSCULAR | Status: AC
  Filled 2015-11-30: qty 1

## 2015-11-30 MED ORDER — ACETAMINOPHEN 325 MG PO TABS: 650.0000 mg | ORAL_TABLET | Freq: Four times a day (QID) | ORAL | Status: DC | PRN

## 2015-11-30 MED ORDER — SENNA 8.6 MG PO TABS
2.0000 | ORAL_TABLET | Freq: Every day | ORAL | Status: DC
  Administered 2015-12-01 – 2015-12-05 (×3): 17.2 mg via ORAL
  Filled 2015-11-30 (×4): qty 2

## 2015-11-30 MED ORDER — FENTANYL 25 MCG/HR TD PT72
25.0000 ug | MEDICATED_PATCH | TRANSDERMAL | Status: DC
  Administered 2015-12-03: 25 ug via TRANSDERMAL
  Filled 2015-11-30: qty 1

## 2015-11-30 MED ORDER — DOCUSATE SODIUM 100 MG PO CAPS
100.0000 mg | ORAL_CAPSULE | Freq: Two times a day (BID) | ORAL | Status: DC
  Administered 2015-12-01 – 2015-12-05 (×9): 100 mg via ORAL
  Filled 2015-11-30 (×10): qty 1

## 2015-11-30 MED ORDER — ACETAMINOPHEN 650 MG RE SUPP: 650.0000 mg | Freq: Four times a day (QID) | RECTAL | Status: DC | PRN

## 2015-11-30 NOTE — Evaluation (Signed)
Physical Therapy Evaluation Patient Details Name: Darryl Hatfield MRN: [id number] DOB: 03/23/1960 Today's Date: 11/30/2015   History of Present Illness  55 y/o male here complaints of chest pain and hypoxia.  He has lung cancer with metastacies, had b/l PEs 8/31 is s/p IVC filter and generally has been needing increased care and hospitalization recently. Pt now with B adrenal masses that are consistent with metastatic. Recent admission from 11/1-11/5 for similar symptoms.  Pt with increased frequency of nose bleeds. Was supposed to received HHPT after last discharge  Clinical Impression  Pt is a pleasant 55 year old male who was admitted for complaints of chest pain and hypoxia. Pt performs bed mobility/transfers with mod I and ambulation with cga and HHA. Pt demonstrates deficits with strength/endurance/mobility. Pt complains of increased pain, only agreeable to ambulate to recliner. Pt said he would like to think about receiving HHPT once home with family. Would benefit from skilled PT to address above deficits and promote optimal return to PLOF. Recommend transition to [place] upon discharge from acute hospitalization.       Follow Up Recommendations Home health PT    Equipment Recommendations  None recommended by PT    Recommendations for Other Services       Precautions / Restrictions Precautions Precautions: Fall Restrictions Weight Bearing Restrictions: No      Mobility  Bed Mobility Overal bed mobility: Modified Independent             General bed mobility comments: bed mobility performed with safe technique. No cues required.  Transfers Overall transfer level: Modified independent Equipment used: None             General transfer comment: safe technique with sit<>stand. Upright posture noted  Ambulation/Gait Ambulation/Gait assistance: Min guard Ambulation Distance (Feet): 5 Feet Assistive device: 1 person hand held assist Gait Pattern/deviations: Step-to  pattern     General Gait Details: Safe technique with HHA. All mobility performed on room air as he was on room air upon arrival. O2 sats at 73%. 4L of O2 applied with sats slowly improving to 91%. Step to gait pattern performed  Stairs            Wheelchair Mobility    Modified Rankin (Stroke Patients Only)       Balance Overall balance assessment: Needs assistance Sitting-balance support: Feet supported Sitting balance-Leahy Scale: Good     Standing balance support: Single extremity supported Standing balance-Leahy Scale: Good                               Pertinent Vitals/Pain Pain Assessment: Faces Faces Pain Scale: Hurts whole lot Pain Location: "everywhere" Pain Descriptors / Indicators: Dull;Discomfort Pain Intervention(s): Limited activity within patient's tolerance    Home Living Family/patient expects to be discharged to:: Private residence Living Arrangements: Spouse/significant other;Children;Other relatives Available Help at Discharge: Family Type of Home: House Home Access: Stairs to enter Entrance Stairs-Rails: Right Entrance Stairs-Number of Steps: 4 Home Layout: One level Home Equipment: Walker - 2 wheels      Prior Function Level of Independence: Independent with assistive device(s)         Comments: recently was independent without AD, however now uses RW     Hand Dominance        Extremity/Trunk Assessment   Upper Extremity Assessment: Generalized weakness (B UE grossly 4+/5)           Lower Extremity Assessment: Generalized  weakness (B LE grossly 4+/5)         Communication   Communication: No difficulties  Cognition Arousal/Alertness: Awake/alert Behavior During Therapy: WFL for tasks assessed/performed Overall Cognitive Status: Within Functional Limits for tasks assessed                      General Comments      Exercises Other Exercises Other Exercises: Pt with decreased safety  awarness, needs assist once seated in recliner for pursed lip breathing with cues to improve O2 sats with 4L of O2 applied.  Takes extended time for O2 sats to improve. Cued to try to stop talking and work on pursed lip breathing   Assessment/Plan    PT Assessment Patient needs continued PT services  PT Problem List Decreased strength;Decreased activity tolerance;Decreased range of motion;Decreased balance;Decreased mobility;Decreased coordination;Decreased cognition;Decreased knowledge of use of DME;Decreased safety awareness;Cardiopulmonary status limiting activity          PT Treatment Interventions Gait training;DME instruction;Stair training;Functional mobility training;Therapeutic activities;Therapeutic exercise;Balance training;Neuromuscular re-education;Patient/family education    PT Goals (Current goals can be found in the Care Plan section)  Acute Rehab PT Goals Patient Stated Goal: go home PT Goal Formulation: With patient Time For Goal Achievement: 12/14/15 Potential to Achieve Goals: Good    Frequency Min 2X/week   Barriers to discharge        Co-evaluation               End of Session Equipment Utilized During Treatment: Gait belt;Oxygen Activity Tolerance: Patient limited by fatigue Patient left: in chair;with chair alarm set Nurse Communication: Mobility status         Time: 1440-1505 PT Time Calculation (min) (ACUTE ONLY): 25 min   Charges:   PT Evaluation $PT Eval Moderate Complexity: 1 Procedure PT Treatments $Therapeutic Activity: 8-22 mins   PT G Codes:        Lizzie An December 19, 2015, 5:07 PM  Greggory Stallion, PT, DPT [phone]

## 2015-11-30 NOTE — Progress Notes (Addendum)
Daily Progress Note   Patient Name: Darryl Hatfield       Date: 11/30/2015 DOB: 1960-08-09  Age: 55 y.o. MRN#: [id number] Attending Physician: Dustin Flock, MD Primary Care Physician: Marinda Elk, MD Admit Date: 11/27/2015  Reason for Consultation/Follow-up: Pain control and Psychosocial/spiritual support  Subjective:  Darryl Hatfield is not coughing up blood.  He is still requiring 4L n/c.  He was just started on oxygen during his last hospitalization 11/1 - 11/5.  He is asking what he should do after d/c.  He does not want to burden his family and asked about rehab.   We discussed that he does not have a deficit to rehab and therefore insurance would likely not cover it.  He would be better off to bring an aide into the home.  His wife works as a Copywriter, advertising at a pain clinic.  Its a new job she can't take breaks to talk on the phone or miss work.  I did talk with her on the phone.  We discussed symptom relief and hospice services.  She supports Darryl Hatfield's DNR decision.  She is hopeful Darryl Hatfield will live a few more years.  She wants to wait until all of the information has been collected (bronchoscopy results).  I advised Darryl Hatfield to go home with Palliative Care outpatient follow up, and if he decides not further chemo or radiation then he should request hospice services to help him at home.  With relief of pain/anxiety/suffering studies show people live longer.  Darryl Hatfield still complains of 8/10 chest pain.  Now he is also complaining of constipation.    Length of Stay: 3  Current Medications: Scheduled Meds:  . acetylcysteine  2 mL Nebulization BID  . aspirin EC  81 mg Oral Daily  . atorvastatin  80 mg Oral Daily  . calcium-vitamin D  1 tablet Oral BID  . DULoxetine  60 mg Oral Daily  . [START ON  12/03/2015] fentaNYL  25 mcg Transdermal Q72H  . folic acid  1 mg Oral Daily  . gabapentin  100 mg Oral TID  . guaiFENesin  600 mg Oral BID  . Influenza vac split quadrivalent PF  0.5 mL Intramuscular Tomorrow-1000  . levalbuterol  1.25 mg Nebulization Q6H  . levofloxacin  500 mg Oral Q24H  . levothyroxine  44 mcg  Oral QAC breakfast  . LORazepam  0.5 mg Oral QHS  . methylPREDNISolone (SOLU-MEDROL) injection  40 mg Intravenous Q6H  . metoprolol succinate  25 mg Oral BID  . mometasone-formoterol  2 puff Inhalation BID  . oxyCODONE  10 mg Oral Q6H  . pantoprazole  40 mg Oral Daily  . QUEtiapine  50 mg Oral Daily  . senna  2 tablet Oral Daily  . sodium chloride flush  3 mL Intravenous Q12H  . sodium chloride flush  3 mL Intravenous Q12H  . tiotropium  18 mcg Inhalation Daily    Continuous Infusions:   PRN Meds: sodium chloride, acetaminophen **OR** acetaminophen, hydrOXYzine, LORazepam, metoCLOPramide, morphine injection, nitroGLYCERIN, ondansetron, sodium chloride flush  Physical Exam        Wd, Thin, A&O, appropriate.  Productive cough CV rrr, no m/r/g Resp on 4L n/c, no increased work of breathing Abdomen soft, nt, nd Extremities 5/5 strength in each, no edema   Vital Signs: BP 136/81   Pulse 87   Temp 98.2 F (36.8 C) (Oral)   Resp 18   Ht '5\' 8"'$  (1.727 m)   Wt 60.7 kg (133 lb 14.4 oz)   SpO2 95%   BMI 20.36 kg/m  SpO2: SpO2: 95 % O2 Device: O2 Device: Nasal Cannula O2 Flow Rate: O2 Flow Rate (L/min): 4 L/min  Intake/output summary:   Intake/Output Summary (Last 24 hours) at 11/30/15 1421 Last data filed at 11/30/15 9518  Gross per 24 hour  Intake                0 ml  Output             1350 ml  Net            -1350 ml   LBM: Last BM Date: 11/27/15 Baseline Weight: Weight: 62.1 kg (137 lb) Most recent weight: Weight: 60.7 kg (133 lb 14.4 oz)       Palliative Assessment/Data:    Flowsheet Rows   Flowsheet Row Most Recent Value  Intake Tab  Referral  Department  Hospitalist  Unit at Time of Referral  Cardiac/Telemetry Unit  Palliative Care Primary Diagnosis  Cancer  Date Notified  11/28/15  Palliative Care Type  New Palliative care  Reason for referral  Clarify Goals of Care  Date of Admission  11/27/15  Date first seen by Palliative Care  11/28/15  # of days Palliative referral response time  0 Day(s)  # of days IP prior to Palliative referral  1  Clinical Assessment  Palliative Performance Scale Score  50%  Psychosocial & Spiritual Assessment  Palliative Care Outcomes  Patient/Family meeting held?  Yes  Who was at the meeting?  patient  Palliative Care Outcomes  Changed CPR status      Patient Active Problem List   Diagnosis Date Noted  . DNR (do not resuscitate)   . Primary malignant neoplasm of lung metastatic to other site [facility])   . Hemoptysis   . SOB (shortness of breath) 11/27/2015  . Acute respiratory failure with hypoxemia ([place]) 11/22/2015  . Bone metastasis ([place]) 11/17/2015  . Symptomatic anemia 11/04/2015  . Other chest pain 11/04/2015  . Tachycardia 11/04/2015  . Pulmonary embolism without acute cor pulmonale ([place]) 10/07/2015  . Acute deep vein thrombosis (DVT) of femoral vein of both lower extremities ([place]) 10/07/2015  . Colitis 10/07/2015  . Dehydration 09/28/2015  . Epigastric discomfort 09/16/2015  . STEMI (ST elevation myocardial infarction) ([place]) 09/16/2015  . Pruritus 07/27/2015  .  Constipation 06/05/2015  . Anxiety 05/12/2015  . Weight loss 05/12/2015  . 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 Adenocarcinoma of lung, stage 4 ([place]) 04/25/2015  . Iron deficiency anemia 04/05/2015  . Episodes of formed visual hallucinations 12/26/2014  . Dementia with behavioral disturbance 12/26/2014  . Delirium 12/25/2014  . Cancer of upper lobe of right lung ([place]) 07/02/2013  . SCIATICA 03/01/2010    Palliative Care Assessment & Plan   Patient Profile: 55 y.o. male  with past medical history of  lung cancer with metastatic spread, COPD, CHF, seizures, and hypothyroidism who was admitted on 11/27/2015 with SOB, epistaxis and hemoptysis.  Darryl Hatfield has had multiple hospitalizations recently 9/22 - 10/4 and [facility], 10/14 - 10/15 and 11/1 - 11/5 here at [facility].  He asks, "am I ever going to live outside of the hospital?".  He had a PE and is on Lovenox which could lend itself to bleeding. His baseline Hgb appears to be 11-12.  He was treated for symptomatic anemia on 10/14 when he received 2 units of blood. Hgb was 14.00 on 10/15 and has steadily trended down to 9.8 (MCV is normocytic). CXR on admission does not show PNA or acute changes. He was discharged on 11/5 on home oxygen, 2L.  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     Assessment: 11/9 hemoptysis appears to have stopped.  Pulm is following.  Bronchoscopy is being considered if he has no improvement on steroids.  Family is in information gathering mode to make decisions about discharge care and planning.  Recommendations/Plan:  PMT will follow supportively and to reassess pain management on Monday.  Increased Fentanyl Patch to 25 mcg.       Would continue fentanyl patch at 25 mcg on discharge, as well as Oxycodone 5 mcg q 4 hours PRN and senna 2 tabs daily.  Started Senna 2 tablets daily while on opioid medications.  Please discharge with Palliative Care Outpatient follow up unless he opts for no chemo or radiation - then d/c with hospice services in the home.  Goals of Care and Additional Recommendations:  Limitations on Scope of Treatment: Avoid Hospitalization and Minimize Medications  Code Status:  DNR  Prognosis:   < 6 months given recurrent hospitalizations advancing lung cancer and COPD.  If he decides against chemo/radiation, he is hospice eligible.  Discharge Planning:  To Be Determined  Care plan was discussed with  the patient and primary attending MD.  Thank you for allowing the Palliative Medicine Team to assist in the care of this patient.   Time In: 1:30 Time Out: 2:35 Total Time 65 min Prolonged Time Billed no      Greater than 50%  of this time was spent counseling and coordinating care related to the above assessment and plan.  Imogene Burn, PA-C Palliative Medicine   Please contact Palliative MedicineTeam phone at [phone] for questions and concerns.   Please see AMION for individual provider pager numbers.

## 2015-11-30 NOTE — Progress Notes (Signed)
[place] [facility] [place] NAME: Darryl Hatfield    MRN#:  [id number]  DATE OF BIRTH:  Dec 16, 1960  SUBJECTIVE:  Hospital Day: 3 days Darryl Hatfield is a 55 y.o. male presenting with Chest Pain .   Patient continues to complain of shortness of breath on 4 L of oxygen no significant change in condition since admission,   REVIEW OF SYSTEMS:  CONSTITUTIONAL: No fever, fatigue or weakness.  EYES: No blurred or double vision.  EARS, NOSE, AND THROAT: No tinnitus or ear pain.  RESPIRATORY: Positive cough, shortness of breath, denies wheezing or hemoptysis.  CARDIOVASCULAR: No chest pain, orthopnea, edema.  GASTROINTESTINAL: No nausea, vomiting, diarrhea or abdominal pain.  GENITOURINARY: No dysuria, hematuria.  ENDOCRINE: No polyuria, nocturia,  HEMATOLOGY: No anemia, easy bruising or bleeding SKIN: No rash or lesion. MUSCULOSKELETAL: No joint pain or arthritis.   NEUROLOGIC: No tingling, numbness, weakness.  PSYCHIATRY: No anxiety or depression.   DRUG ALLERGIES:   Allergies  Allergen Reactions  . No Known Allergies     VITALS:  Blood pressure 136/81, pulse 87, temperature 98.2 F (36.8 C), temperature source Oral, resp. rate 18, height '5\' 8"'$  (1.727 m), weight 133 lb 14.4 oz (60.7 kg), SpO2 91 %.  PHYSICAL EXAMINATION:  VITAL SIGNS: Vitals:   11/30/15 0827 11/30/15 1140  BP: (!) 141/78 136/81  Pulse: 92 87  Resp:    Temp:  98.2 F (36.8 C)   GENERAL:55 y.o.male currently in no acute distress.  HEAD: Normocephalic, atraumatic.  EYES: Pupils equal, round, reactive to light. Extraocular muscles intact. No scleral icterus.  MOUTH: Moist mucosal membrane. Dentition intact. No abscess noted.  EAR, NOSE, THROAT: Clear without exudates. No external lesions.  NECK: Supple. No thyromegaly. No nodules. No JVD.  PULMONARY: Diminished breath sounds bilaterally no wheezing no rhonchi  CHEST: Nontender to palpation.  CARDIOVASCULAR: S1 and  S2. Regular rate and rhythm. No murmurs, rubs, or gallops. No edema. Pedal pulses 2+ bilaterally.  GASTROINTESTINAL: Soft, nontender, nondistended. No masses. Positive bowel sounds. No hepatosplenomegaly.  MUSCULOSKELETAL: No swelling, clubbing, or edema. Range of motion full in all extremities.  NEUROLOGIC: Cranial nerves II through XII are intact. No gross focal neurological deficits. Sensation intact. Reflexes intact.  SKIN: No ulceration, lesions, rashes, or cyanosis. Skin warm and dry. Turgor intact.  PSYCHIATRIC: Mood, affect within normal limits. The patient is awake, alert and oriented x 3. Insight, judgment intact.      LABORATORY PANEL:   CBC  Recent Labs Lab 11/28/15 0342  WBC 5.0  HGB 9.8*  HCT 30.4*  PLT 92*   ------------------------------------------------------------------------------------------------------------------  Chemistries   Recent Labs Lab 11/28/15 0342  NA 139  K 4.6  CL 97*  CO2 33*  GLUCOSE 144*  BUN 20  CREATININE 1.17  CALCIUM 9.3   ------------------------------------------------------------------------------------------------------------------  Cardiac Enzymes  Recent Labs Lab 11/27/15 1257  TROPONINI <0.03   ------------------------------------------------------------------------------------------------------------------  RADIOLOGY:  No results found.  EKG:   Orders placed or performed during the hospital encounter of 11/27/15  . ED EKG within 10 minutes  . ED EKG within 10 minutes    ASSESSMENT AND PLAN:   My Darryl Hatfield is a 55 y.o. male presenting with Chest Pain . Admitted 11/27/2015 : Day #: 3 days 1. Acute on chronic hypoxic respiratory failure: Multifactorial setting of lung cancer, COPD Continue with levaquiPatient placed back on IV Solu-Medrol by pulmonary Continue  breathing treatments, oxygenTherapy   I discussed with Dr. Susy Manor who states  that she spoke to Dr. Humphrey Rolls, who we'll be planning a bronchoscopy if  his condition is not improved.  2. Metastatic lung cancer seen by oncology, prognosis very poor,  3. Hemoptysis: Small amount either related to nosebleed from oxygen versus bronchitis versus cancer pulmonary saw the patient yesterday but no note unclear if bronchoscopy will change his current condition no further recurrent4. Hyperlipidemia unspecified: Statin therapy   Patient seen by palliative care team and as made DO NOT RESUSCITATE. He understands his prognosis is very poor and this is acceptable  All the records are reviewed and case discussed with Care Management/Social Workerr. Management plans discussed with the patient, family and they are in agreement.  CODE STATUS: dnr TOTAL TIME TAKING CARE OF THIS PATIENT:  57mn .   POSSIBLE D/C IN 1-2 DAYS, DEPENDING ON CLINICAL CONDITION.   PDustin FlockM.D on 11/30/2015 at 1:01 PM  Between 7am to 6pm - Pager - [phone] After 6pm: House Pager: [phone] ETyna JakschHospitalists  Office  [phone] CC: Primary care physician; MMarinda Elk MD

## 2015-12-01 ENCOUNTER — Encounter
Admission: EM | Disposition: A | Discharge status: Hospice | Payer: Self-pay | Source: Home / Self Care | Attending: Internal Medicine

## 2015-12-01 HISTORY — PX: FLEXIBLE BRONCHOSCOPY: SHX5094

## 2015-12-01 LAB — CBC
HCT: 31.2 % — ABNORMAL LOW (ref 40.0–52.0)
Hemoglobin: 10.3 g/dL — ABNORMAL LOW (ref 13.0–18.0)
MCH: 29 pg (ref 26.0–34.0)
MCHC: 32.9 g/dL (ref 32.0–36.0)
MCV: 88 fL (ref 80.0–100.0)
PLATELETS: 89 10*3/uL — AB (ref 150–440)
RBC: 3.55 MIL/uL — AB (ref 4.40–5.90)
RDW: 17.1 % — ABNORMAL HIGH (ref 11.5–14.5)
WBC: 13.7 10*3/uL — ABNORMAL HIGH (ref 3.8–10.6)

## 2015-12-01 LAB — COMPREHENSIVE METABOLIC PANEL
ALT: 19 U/L (ref 17–63)
ANION GAP: 10 (ref 5–15)
AST: 38 U/L (ref 15–41)
Albumin: 3.1 g/dL — ABNORMAL LOW (ref 3.5–5.0)
Alkaline Phosphatase: 162 U/L — ABNORMAL HIGH (ref 38–126)
BUN: 34 mg/dL — ABNORMAL HIGH (ref 6–20)
CHLORIDE: 95 mmol/L — AB (ref 101–111)
CO2: 33 mmol/L — AB (ref 22–32)
Calcium: 10 mg/dL (ref 8.9–10.3)
Creatinine, Ser: 1.05 mg/dL (ref 0.61–1.24)
GFR calc non Af Amer: >60 mL/min (ref >60)
Glucose, Bld: 97 mg/dL (ref 65–99)
Potassium: 4.6 mmol/L (ref 3.5–5.1)
SODIUM: 138 mmol/L (ref 135–145)
Total Bilirubin: 0.6 mg/dL (ref 0.3–1.2)
Total Protein: 6 g/dL — ABNORMAL LOW (ref 6.5–8.1)

## 2015-12-01 SURGERY — BRONCHOSCOPY, FLEXIBLE
Anesthesia: IV Sedation (MBSC Only) | Laterality: Bilateral

## 2015-12-01 MED ORDER — LIDOCAINE HCL 2 % EX GEL
1.0000 "application " | Freq: Once | CUTANEOUS | Status: DC
  Filled 2015-12-01: qty 5

## 2015-12-01 MED ORDER — PHENYLEPHRINE HCL 0.25 % NA SOLN
1.0000 | Freq: Four times a day (QID) | NASAL | Status: DC | PRN
  Filled 2015-12-01: qty 15

## 2015-12-01 MED ORDER — FENTANYL CITRATE (PF) 100 MCG/2ML IJ SOLN
INTRAMUSCULAR | Status: DC
Start: 2015-12-01 — End: 2015-12-01
  Filled 2015-12-01: qty 4

## 2015-12-01 MED ORDER — POLYETHYLENE GLYCOL 3350 17 G PO PACK
17.0000 g | PACK | Freq: Every day | ORAL | Status: DC
  Administered 2015-12-01 – 2015-12-05 (×3): 17 g via ORAL
  Filled 2015-12-01 (×3): qty 1

## 2015-12-01 MED ORDER — MORPHINE SULFATE (PF) 4 MG/ML IV SOLN
1.0000 mg | INTRAVENOUS | Status: DC | PRN
  Administered 2015-12-02 – 2015-12-05 (×7): 2 mg via INTRAVENOUS
  Filled 2015-12-01 (×7): qty 1

## 2015-12-01 MED ORDER — BUTAMBEN-TETRACAINE-BENZOCAINE 2-2-14 % EX AERO
1.0000 | INHALATION_SPRAY | Freq: Once | CUTANEOUS | Status: DC
  Filled 2015-12-01: qty 20

## 2015-12-01 MED ORDER — MIDAZOLAM HCL 5 MG/5ML IJ SOLN
INTRAMUSCULAR | Status: AC
  Filled 2015-12-01: qty 10

## 2015-12-01 MED ORDER — ONDANSETRON HCL 4 MG/2ML IJ SOLN
INTRAMUSCULAR | Status: AC
  Filled 2015-12-01: qty 4

## 2015-12-01 NOTE — Progress Notes (Signed)
[facility] Hematology/Oncology Progress Note  Date of admission: 11/27/2015  Hospital day:  11/30/2015  Chief Complaint: Darryl Hatfield is a 54 y.o. 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  Subjective: Feels about the same.  Shortness of breath with activities.  Sat in chair today.  Social History: The patient is accompanied by his wife today.  Allergies:  Allergies  Allergen Reactions  . No Known Allergies     Scheduled Medications: . acetylcysteine  2 mL Nebulization BID  . aspirin EC  81 mg Oral Daily  . atorvastatin  80 mg Oral Daily  . calcium-vitamin D  1 tablet Oral BID  . docusate sodium  100 mg Oral BID  . DULoxetine  60 mg Oral Daily  . [START ON 12/03/2015] fentaNYL  25 mcg Transdermal Q72H  . folic acid  1 mg Oral Daily  . gabapentin  100 mg Oral TID  . guaiFENesin  600 mg Oral BID  . Influenza vac split quadrivalent PF  0.5 mL Intramuscular Tomorrow-1000  . levalbuterol  1.25 mg Nebulization Q6H  . levofloxacin  500 mg Oral Q24H  . levothyroxine  44 mcg Oral QAC breakfast  . LORazepam  0.5 mg Oral QHS  . methylPREDNISolone (SOLU-MEDROL) injection  40 mg Intravenous Q6H  . metoprolol succinate  25 mg Oral BID  . mometasone-formoterol  2 puff Inhalation BID  . oxyCODONE  10 mg Oral Q6H  . pantoprazole  40 mg Oral Daily  . QUEtiapine  50 mg Oral Daily  . senna  2 tablet Oral Daily  . sodium chloride flush  3 mL Intravenous Q12H  . sodium chloride flush  3 mL Intravenous Q12H  . tiotropium  18 mcg Inhalation Daily    Review of Systems: GENERAL: Feels "the same". No fevers, chills, or sweats.  PERFORMANCE STATUS (ECOG): 2 HEENT: No visual changes, runny nose, sore throat, mouth sores or tenderness. Lungs: Shortness of breath on 4 liters/min [place]. Cough. No further hemoptysis. Cardiac: No chest  pain, palpitations, orthopnea, or PND. GI: No nausea, vomiting, constipation, diarrhea, melena or hematochezia.  GU: No urgency, frequency, dysuria, or hematuria. Musculoskeletal: Right sided rib pain (chronic). Back pain. No muscle tenderness. Extremities: No swelling or pain. Skin: Bruising on arms and abdomen. No rashes. Neuro: No headache, numbness or weakness, balance or coordination issues. Endocrine: No diabetes. Thyroid disease on Synthroid. No hot flashes or night sweats. Psych: Anxiety. No mood changes or depression. Pain: Right sided chest discomfort. Review of systems: All other systems reviewed and found to be negative.  Physical Exam: Blood pressure 139/81, pulse 87, temperature 97.9 F (36.6 C), temperature source Oral, resp. rate 18, height '5\' 8"'$  (1.727 m), weight 133 lb 14.4 oz (60.7 kg), SpO2 95 %.  GENERAL:Thin gentleman sitting up on the medical unit in no acute distress.  MENTAL STATUS: Alert and oriented to person, place and time. HEAD:Slightly disheveled.  Black hair with slight graying. Mustache. Normocephalic, atraumatic, face symmetric, no Cushingoid features. EYES:Glasses. Blue eyes. Pupils equal round and reactive to light and accomodation. No conjunctivitis or scleral icterus. ENT:[place] in place.  Oropharynx clear without lesion. Dentures. Tonguenormal. Mucous membranes moist. RESPIRATORY: Clear to auscultation without rales, wheezes or rhonchi. CARDIOVASCULAR:Regular rate andrhythmwithout murmur, rub or gallop. ABDOMEN:Soft,non-tender with active bowel sounds and no hepatosplenomegaly. No masses. SKIN: Ecchymosis on upper extremities (left > right).  EXTREMITIES: Thin arms. No lower extremity edema. No skin discoloration or tenderness. No palpable cords. LYMPHNODES: No palpable cervical, supraclavicular, axillary or inguinal adenopathy. NEUROLOGICAL: Unremarkable. PSYCH: Anxious.    Results for orders placed or  performed during the hospital encounter of 11/27/15 (from the past 48 hour(s))  Procalcitonin     Status: None   Collection Time: 11/30/15  5:20 AM  Result Value Ref Range   Procalcitonin 0.13 ng/mL    Comment:        Interpretation: PCT (Procalcitonin) <= 0.5 ng/mL: Systemic infection (sepsis) is not likely. Local bacterial infection is possible. (NOTE)         ICU PCT Algorithm               Non ICU PCT Algorithm    ----------------------------     ------------------------------         PCT < 0.25 ng/mL                 PCT < 0.1 ng/mL     Stopping of antibiotics            Stopping of antibiotics       strongly encouraged.               strongly encouraged.    ----------------------------     ------------------------------       PCT level decrease by               PCT < 0.25 ng/mL       >= 80% from peak PCT       OR PCT 0.25 - 0.5 ng/mL          Stopping of antibiotics                                             encouraged.     Stopping of antibiotics           encouraged.    ----------------------------     ------------------------------       PCT level decrease by              PCT >= 0.25 ng/mL       < 80% from peak PCT        AND PCT >= 0.5 ng/mL            Continuin g antibiotics                                              encouraged.       Continuing antibiotics            encouraged.    ----------------------------     ------------------------------     PCT level increase compared          PCT > 0.5 ng/mL         with [facility] PCT AND          PCT >= 0.5 ng/mL             Escalation of antibiotics  strongly encouraged.      Escalation of antibiotics        strongly encouraged.    No results found.  Assessment:  Darryl Hatfield is a 55 y.o. 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    Chest  CT angiogram on 11/27/2015 revealed no evidence of a pulmonary embolus and no acute findings in the chest.  There is no evidence of lymphangitic spread of tumor.  When reviewing current films and CT scans from earlier this summer (when patient was not on oxygen), he has more central prominence/fullness with narrowing of airways.  Plan: 1.  Oncology:  Today I had a long talk with the patient and his wife.  I explained my review of his chest CTs with radiology.  He has progressive changes in the central right chest (when comparing CT scans from earlier this summer) which likely represent progressive disease.  This would be in the field of his prior radiation for squamous cell lung cancer.   He also has metastatic adenocarcinoma.    I re-reviewed data for Taxotere.  As compared to best supportive care, patients receiving Taxotere have a median survival of 7.5 months vs 4.6 months (one-year survival of 37% vs 19%). Taxotere also improves pain control with less deterioration in quality of life (QOL) compared to best supportive care.  The patient is unsure about treatment.  He does not want to be a burden on his wife.  He is concerned about going home and being short of breath without enough oxygen.  We discussed supportive care with Hospice.  He was unaware that he could not pursue active treatment and receive Hospice services at the same time.  He is currently undecided about pursuing treatment.  Numerous questions were asked and answered.  2.  Pulmonary:  Possible bronchoscopy in the morning.  Patient wishes to discuss further with pulmonary medicine.  3.  Code status:  DNR.   Lequita Asal, MD  11/30/2015

## 2015-12-01 NOTE — Progress Notes (Signed)
[facility] [place] NAME: Darryl Hatfield    MRN#:  [id number]  DATE OF BIRTH:  09/09/1960  SUBJECTIVE:  Hospital Day: 4 days Darryl Hatfield is a 55 y.o. male presenting with Chest Pain .   Continues to complain of shortness of breath is scheduled to have bronchoscopy later today  REVIEW OF SYSTEMS:  CONSTITUTIONAL: No fever, fatigue or weakness.  EYES: No blurred or double vision.  EARS, NOSE, AND THROAT: No tinnitus or ear pain.  RESPIRATORY: Positive cough, Positive shortness of breath, denies wheezing or hemoptysis.  CARDIOVASCULAR: No chest pain, orthopnea, edema.  GASTROINTESTINAL: No nausea, vomiting, diarrhea or abdominal pain.  GENITOURINARY: No dysuria, hematuria.  ENDOCRINE: No polyuria, nocturia,  HEMATOLOGY: No anemia, easy bruising or bleeding SKIN: No rash or lesion. MUSCULOSKELETAL: No joint pain or arthritis.   NEUROLOGIC: No tingling, numbness, weakness.  PSYCHIATRY: No anxiety or depression.   DRUG ALLERGIES:   Allergies  Allergen Reactions  . No Known Allergies     VITALS:  Blood pressure (!) 168/94, pulse 89, temperature 97.5 F (36.4 C), temperature source Oral, resp. rate (!) 24, height '5\' 8"'$  (1.727 m), weight 133 lb 14.4 oz (60.7 kg), SpO2 92 %.  PHYSICAL EXAMINATION:  VITAL SIGNS: Vitals:   12/01/15 0944 12/01/15 1117  BP: 136/85 (!) 168/94  Pulse: 92 89  Resp: 20 (!) 24  Temp: 97.7 F (36.5 C) 97.5 F (36.4 C)   GENERAL:55 y.o.male currently in no acute distress.  HEAD: Normocephalic, atraumatic.  EYES: Pupils equal, round, reactive to light. Extraocular muscles intact. No scleral icterus.  MOUTH: Moist mucosal membrane. Dentition intact. No abscess noted.  EAR, NOSE, THROAT: Clear without exudates. No external lesions.  NECK: Supple. No thyromegaly. No nodules. No JVD.  PULMONARY: Diminished breath sounds bilaterally no wheezing no rhonchi  CHEST: Nontender to palpation.   CARDIOVASCULAR: S1 and S2. Regular rate and rhythm. No murmurs, rubs, or gallops. No edema. Pedal pulses 2+ bilaterally.  GASTROINTESTINAL: Soft, nontender, nondistended. No masses. Positive bowel sounds. No hepatosplenomegaly.  MUSCULOSKELETAL: No swelling, clubbing, or edema. Range of motion full in all extremities.  NEUROLOGIC: Cranial nerves II through XII are intact. No gross focal neurological deficits. Sensation intact. Reflexes intact.  SKIN: No ulceration, lesions, rashes, or cyanosis. Skin warm and dry. Turgor intact.  PSYCHIATRIC: Mood, affect within normal limits. The patient is awake, alert and oriented x 3. Insight, judgment intact.      LABORATORY PANEL:   CBC  Recent Labs Lab 12/01/15 0956  WBC 13.7*  HGB 10.3*  HCT 31.2*  PLT 89*   ------------------------------------------------------------------------------------------------------------------  Chemistries   Recent Labs Lab 12/01/15 0956  NA 138  K 4.6  CL 95*  CO2 33*  GLUCOSE 97  BUN 34*  CREATININE 1.05  CALCIUM 10.0  AST 38  ALT 19  ALKPHOS 162*  BILITOT 0.6   ------------------------------------------------------------------------------------------------------------------  Cardiac Enzymes  Recent Labs Lab 11/27/15 1257  TROPONINI <0.03   ------------------------------------------------------------------------------------------------------------------  RADIOLOGY:  No results found.  EKG:   Orders placed or performed during the hospital encounter of 11/27/15  . ED EKG within 10 minutes  . ED EKG within 10 minutes    ASSESSMENT AND PLAN:   Darryl Hatfield is a 55 y.o. male presenting with Chest Pain . Admitted 11/27/2015 : Day #: 4 days 1. Acute on chronic hypoxic respiratory failure: Multifactorial setting of lung cancer, COPD Continue with levaquiPatient placed back on IV Solu-Medrol by pulmonary Continue  breathing treatments, oxygenTherapy  Plan for bronchoscopy today   2.  Metastatic lung cancer seen by oncology, prognosis very poor, will need home hospice on discharge 3. Hemoptysis: Small amount either related to nosebleed from oxygen versus bronchitis versus cancer pulmonary  Now resolved plan for bronchial cystoscopy today    Patient seen by palliative care team and as made DO NOT RESUSCITATE. He understands his prognosis is very poor patient will need home hospice at discharge  All the records are reviewed and case discussed with Care Management/Social Workerr. Management plans discussed with the patient, family and they are in agreement.  CODE STATUS: dnr TOTAL TIME TAKING CARE OF THIS PATIENT:  36mn .   POSSIBLE D/C IN 1-2 DAYS, DEPENDING ON CLINICAL CONDITION.   PDustin FlockM.D on 12/01/2015 at 11:57 AM  Between 7am to 6pm - Pager - [phone] After 6pm: House Pager: - [phone] ETyna JakschHospitalists  Office  [phone] CC: Primary care physician; MMarinda Elk MD

## 2015-12-01 NOTE — Progress Notes (Signed)
Patient asking for ativan, states he feels something "is not right." Respiratory assessment revealed even, symmetrical respirations, RR 18, SpO2 92% on 4L [place], ronchi auscultated bilaterally, same as morning assessment. Will continue to monitor.

## 2015-12-01 NOTE — Care Management (Signed)
Bronchoscopy scheduled for today cancelled due to low platelet count.  Patient remains undecided regarding home health vs hospice at home vs treatment to pursue for the malignancy.

## 2015-12-01 NOTE — Progress Notes (Signed)
Dr Chancy Milroy in to speak with patient, noting platelet count decreasing, thus bronchoscopy cancelled for today, questions answered, called Maddy RN care nurse, pt to go back to room at this time.

## 2015-12-02 ENCOUNTER — Encounter: Payer: Self-pay | Admitting: Student

## 2015-12-02 DIAGNOSIS — Z66 Do not resuscitate: Secondary | ICD-10-CM

## 2015-12-02 DIAGNOSIS — D696 Thrombocytopenia, unspecified: Secondary | ICD-10-CM

## 2015-12-02 LAB — CBC WITH DIFFERENTIAL/PLATELET
Band Neutrophils: 2 %
Basophils Absolute: 0 10*3/uL (ref 0–0.1)
Basophils Relative: 0 %
Blasts: 0 %
Eosinophils Absolute: 0 10*3/uL (ref 0–0.7)
Eosinophils Relative: 0 %
HCT: 31.5 % — ABNORMAL LOW (ref 40.0–52.0)
Hemoglobin: 10.4 g/dL — ABNORMAL LOW (ref 13.0–18.0)
Lymphocytes Relative: 8 %
Lymphs Abs: 0.9 10*3/uL — ABNORMAL LOW (ref 1.0–3.6)
MCH: 29.5 pg (ref 26.0–34.0)
MCHC: 33 g/dL (ref 32.0–36.0)
MCV: 89.3 fL (ref 80.0–100.0)
Metamyelocytes Relative: 1 %
Monocytes Absolute: 0.8 10*3/uL (ref 0.2–1.0)
Monocytes Relative: 7 %
Myelocytes: 3 %
Neutro Abs: 9.5 10*3/uL — ABNORMAL HIGH (ref 1.4–6.5)
Neutrophils Relative %: 79 %
Other: 0 %
Platelets: 80 10*3/uL — ABNORMAL LOW (ref 150–440)
Promyelocytes Absolute: 0 %
RBC: 3.53 MIL/uL — ABNORMAL LOW (ref 4.40–5.90)
RDW: 16.8 % — ABNORMAL HIGH (ref 11.5–14.5)
WBC: 11.2 10*3/uL — ABNORMAL HIGH (ref 3.8–10.6)
nRBC: 1 /100 WBC — ABNORMAL HIGH

## 2015-12-02 LAB — PROTIME-INR
INR: 2.81
INR: 2.98
Prothrombin Time: 30.2 s — ABNORMAL HIGH (ref 11.4–15.2)
Prothrombin Time: 31.6 seconds — ABNORMAL HIGH (ref 11.4–15.2)

## 2015-12-02 LAB — APTT
aPTT: 47 seconds — ABNORMAL HIGH (ref 24–36)
aPTT: 44 seconds — ABNORMAL HIGH (ref 24–36)

## 2015-12-02 LAB — FIBRIN DERIVATIVES D-DIMER (ARMC ONLY): Fibrin derivatives D-dimer (ARMC): 7172 — ABNORMAL HIGH (ref 0–499)

## 2015-12-02 LAB — PROCALCITONIN: Procalcitonin: 0.13 ng/mL

## 2015-12-02 LAB — FIBRINOGEN
Fibrinogen: <60 mg/dL — CL (ref 210–475)
Fibrinogen: <60 mg/dL — CL (ref 210–475)

## 2015-12-02 MED ORDER — MORPHINE SULFATE (CONCENTRATE) 10 MG/0.5ML PO SOLN
10.0000 mg | ORAL | Status: DC | PRN
  Administered 2015-12-02 – 2015-12-04 (×2): 10 mg via ORAL
  Filled 2015-12-02 (×3): qty 1

## 2015-12-02 MED ORDER — VANCOMYCIN HCL IN DEXTROSE 750-5 MG/150ML-% IV SOLN
750.0000 mg | Freq: Two times a day (BID) | INTRAVENOUS | Status: DC
  Administered 2015-12-03 – 2015-12-05 (×5): 750 mg via INTRAVENOUS
  Filled 2015-12-02 (×7): qty 150

## 2015-12-02 MED ORDER — VANCOMYCIN HCL IN DEXTROSE 1-5 GM/200ML-% IV SOLN
1000.0000 mg | Freq: Once | INTRAVENOUS | Status: AC
  Administered 2015-12-02: 1000 mg via INTRAVENOUS
  Filled 2015-12-02: qty 200

## 2015-12-02 MED ORDER — PIPERACILLIN-TAZOBACTAM 3.375 G IVPB
3.3750 g | Freq: Three times a day (TID) | INTRAVENOUS | Status: DC
  Administered 2015-12-02 – 2015-12-05 (×9): 3.375 g via INTRAVENOUS
  Filled 2015-12-02 (×9): qty 50

## 2015-12-02 MED ORDER — LACTULOSE 10 GM/15ML PO SOLN
30.0000 g | Freq: Three times a day (TID) | ORAL | Status: DC
  Administered 2015-12-02 (×2): 30 g via ORAL
  Filled 2015-12-02 (×3): qty 60

## 2015-12-02 NOTE — Progress Notes (Signed)
MD Marcille Blanco made aware of new Fibrinogen level. No new orders received.

## 2015-12-02 NOTE — Progress Notes (Signed)
Patient ID: Darryl Hatfield, male   DOB: [birth year], 55 y.o.   MRN: [id number]  ACP discussion  Wife and patient and nursing staff present.  Patient and wife thinking about bronchoscopy and knowing diagnosis before deciding on chemotherapy or hospice care. Patient is already a DO NOT RESUSCITATE. Overall prognosis is poor.patient would be a good candidate for hospice if they decide on no further chemotherapy.  Unfortunately pulmonary canceled bronchoscopy yesterday.  Symptom management with Roxanol.  With no bowel movement in 2 weeks need to give lactulose until bowel movement.  Time spent with this discussion 17 minutes Dr. Loletha Grayer

## 2015-12-02 NOTE — Clinical Social Work Note (Signed)
Clinical Social Work Assessment  Patient Details  Name: Darryl Hatfield MRN: [id number] Date of Birth: 1961-01-15  Date of referral:  12/02/15               Reason for consult:  Facility Placement                Permission sought to share information with:  Chartered certified accountant granted to share information::  Yes, Verbal Permission Granted  Name::        Agency::     Relationship::     Contact Information:     Housing/Transportation Living arrangements for the past 2 months:  Single Family Home Source of Information:  Patient, Spouse Patient Interpreter Needed:  None Criminal Activity/Legal Involvement Pertinent to Current Situation/Hospitalization:  No - Comment as needed Significant Relationships:  Adult Children, Other Family Members, Community Support, Spouse Lives with:  Spouse Do you feel safe going back to the place where you live?  Yes Need for family participation in patient care:  No (Coment)  Care giving concerns:  STR for IV ABX   Social Worker assessment / plan:  The CSW visited the patient and his family at bedside to discuss discharge planning. The patient is amenable to STR for IV ABX and indicated that his preferences are [facility] or [facility]. He gave verbal permission to conduct the bed search.  The patient has recently had o2 but had not had a chance to begin using it or his rolling walker before needing to return to the hospital. The patient indicated that his oxygen is too large for transport to the facility.  CSW offered emotional support and education about palliative care to his family due to the specific questions they asked. The family thanked the CSW for support and explanation.   Employment status:  Retired Engineer, drilling) PT Recommendations:  [place] / Referral to community resources:  [place]  Patient/Family's Response to care:  Patient and family  thanked the [place].  Patient/Family's Understanding of and Emotional Response to Diagnosis, Current Treatment, and Prognosis: The patient and family are in agreement with the dc plan.  Emotional Assessment Appearance:  Appears older than stated age Attitude/Demeanor/Rapport:   (Pleasant) Affect (typically observed):  Accepting, Appropriate, Pleasant Orientation:  Oriented to Self, Oriented to Place, Oriented to  Time, Oriented to Situation Alcohol / Substance use:  Never Used Psych involvement (Current and /or in the community):  No (Comment)  Discharge Needs  Concerns to be addressed:  Discharge Planning Concerns Readmission within the last 30 days:  Yes Current discharge risk:  Chronically ill Barriers to Discharge:  Continued Medical Work up   [employer], LCSW 12/02/2015, 3:37 PM

## 2015-12-02 NOTE — Progress Notes (Addendum)
Patient ID: Darryl Hatfield, male   DOB: Feb 11, 1960, 55 y.o.   MRN: [id number]  [employer] PROGRESS NOTE  Darryl Hatfield OAC:[id number] DOB: Nov 26, 1960 DOA: 11/27/2015 PCP: Marinda Elk, MD  HPI/Subjective: Patient is feeling very weak and short of breath.. Limited in his exercise capacity.  Objective: Vitals:   12/02/15 0422 12/02/15 0800  BP: 139/87 128/83  Pulse: 80 91  Resp: 18 20  Temp: 97.8 F (36.6 C) 97.6 F (36.4 C)    Filed Weights   11/27/15 1245 11/27/15 1753 11/28/15 0406  Weight: 62.1 kg (137 lb) 61.7 kg (136 lb) 60.7 kg (133 lb 14.4 oz)    ROS: Review of Systems  Constitutional: Negative for chills and fever.  Eyes: Negative for blurred vision.  Respiratory: Positive for cough and shortness of breath. Negative for sputum production.   Cardiovascular: Negative for chest pain.  Gastrointestinal: Positive for constipation. Negative for abdominal pain, diarrhea, nausea and vomiting.  Genitourinary: Negative for dysuria.  Musculoskeletal: Negative for joint pain.  Neurological: Negative for dizziness and headaches.   Exam: Physical Exam  Constitutional: He is oriented to person, place, and time.  HENT:  Nose: No mucosal edema.  Mouth/Throat: No oropharyngeal exudate or posterior oropharyngeal edema.  Eyes: Conjunctivae, EOM and lids are normal. Pupils are equal, round, and reactive to light.  Neck: No JVD present. Carotid bruit is not present. No edema present. No thyroid mass and no thyromegaly present.  Cardiovascular: S1 normal and S2 normal.  Exam reveals no gallop.   No murmur heard. Pulses:      Dorsalis pedis pulses are 2+ on the right side, and 2+ on the left side.  Respiratory: No respiratory distress. He has decreased breath sounds in the right upper field, the right middle field, the right lower field, the left upper field, the left middle field and the left lower field. He has no wheezes. He has rhonchi in the right upper field, the  right middle field, the right lower field, the left upper field, the left middle field and the left lower field. He has no rales.  GI: Soft. Bowel sounds are normal. There is no tenderness.  Musculoskeletal:       Right ankle: He exhibits no swelling.       Left ankle: He exhibits no swelling.  Lymphadenopathy:    He has no cervical adenopathy.  Neurological: He is alert and oriented to person, place, and time. No cranial nerve deficit.  Skin: Skin is warm. No rash noted. Nails show no clubbing.  Psychiatric: He has a normal mood and affect.      Data Reviewed: Basic Metabolic Panel:  Recent Labs Lab 11/27/15 1257 11/28/15 0342 12/01/15 0956  NA 136 139 138  K 4.2 4.6 4.6  CL 93* 97* 95*  CO2 32 33* 33*  GLUCOSE 81 144* 97  BUN 21* 20 34*  CREATININE 1.09 1.17 1.05  CALCIUM 10.2 9.3 10.0   Liver Function Tests:  Recent Labs Lab 12/01/15 0956  AST 38  ALT 19  ALKPHOS 162*  BILITOT 0.6  PROT 6.0*  ALBUMIN 3.1*   CBC:  Recent Labs Lab 11/27/15 1257 11/28/15 0342 12/01/15 0956 12/02/15 0430  WBC 8.4 5.0 13.7* 11.2*  NEUTROABS  --   --   --  9.5*  HGB 10.5* 9.8* 10.3* 10.4*  HCT 31.7* 30.4* 31.2* 31.5*  MCV 88.2 89.8 88.0 89.3  PLT 119* 92* 89* 80*   Cardiac Enzymes:  Recent Labs Lab 11/27/15  [place] <0.03   BNP (last 3 results)  Recent Labs  09/16/15 1917  BNP 97.0     CBG:  Recent Labs Lab 11/26/15 0822  GLUCAP 83    Recent Results (from the past 240 hour(s))  Culture, blood (routine x 2)     Status: None   Collection Time: 11/22/15  3:35 PM  Result Value Ref Range Status   Specimen Description BLOOD  RIGHT FORE ARM  Final   Special Requests   Final    BOTTLES DRAWN AEROBIC AND ANAEROBIC  AER 13CC ANA 12CC   Culture NO GROWTH 5 DAYS  Final   Report Status 11/27/2015 FINAL  Final  Culture, blood (routine x 2)     Status: None   Collection Time: 11/22/15  3:35 PM  Result Value Ref Range Status   Specimen Description  BLOOD  LEFT FORE ARM  Final   Special Requests   Final    BOTTLES DRAWN AEROBIC AND ANAEROBIC  AER [place] ANA [place]   Culture NO GROWTH 5 DAYS  Final   Report Status 11/27/2015 FINAL  Final      Scheduled Meds: . acetylcysteine  2 mL Nebulization BID  . aspirin EC  81 mg Oral Daily  . atorvastatin  80 mg Oral Daily  . calcium-vitamin D  1 tablet Oral BID  . docusate sodium  100 mg Oral BID  . DULoxetine  60 mg Oral Daily  . [START ON 12/03/2015] fentaNYL  25 mcg Transdermal Q72H  . folic acid  1 mg Oral Daily  . gabapentin  100 mg Oral TID  . guaiFENesin  600 mg Oral BID  . Influenza vac split quadrivalent PF  0.5 mL Intramuscular Tomorrow-1000  . lactulose  30 g Oral TID  . levalbuterol  1.25 mg Nebulization Q6H  . levothyroxine  44 mcg Oral QAC breakfast  . LORazepam  0.5 mg Oral QHS  . methylPREDNISolone (SOLU-MEDROL) injection  40 mg Intravenous Q6H  . metoprolol succinate  25 mg Oral BID  . mometasone-formoterol  2 puff Inhalation BID  . oxyCODONE  10 mg Oral Q6H  . pantoprazole  40 mg Oral Daily  . polyethylene glycol  17 g Oral Daily  . QUEtiapine  50 mg Oral Daily  . senna  2 tablet Oral Daily  . sodium chloride flush  3 mL Intravenous Q12H  . sodium chloride flush  3 mL Intravenous Q12H  . tiotropium  18 mcg Inhalation Daily    Assessment/Plan:  1. Acute on chronic hypoxic respiratory failure with worsening respiratory status. Continue oxygen supplementation. Try Roxanol for symptom management. 2. DIC unclear etiology. Start zosyn and vancomycin. Oncology to consider cryoprecipitate. 3. Metastatic lung cancer. Unfortunately pulmonary canceled bronchoscopy. Platelets are high enough to do the procedure. Overall prognosis is poor. Please see ACP note from today. Patient already a DO NOT RESUSCITATE. Patient will be a candidate for hospice if he decides on no further chemotherapy. 4. COPD exacerbation. IV Solu-Medrol and nebulizer treatments.  Patient was on levaquin  previously but this was stopped. 5. Hemoptysis. Resolved 6. Constipation. Start lactulose until bowel movement. If lactulose unsuccessful may end up giving a dose of Relistor 7. Hyperlipidemia unspecified. Stop statin 8. Hypothyroidism unspecified. Continue levothyroxine.  Code Status:     Code Status Orders        Start     Ordered   11/29/15 0904  Do not attempt resuscitation (DNR)  Continuous    Question Answer Comment  In the event of cardiac or respiratory ARREST Do not call a "code blue"   In the event of cardiac or respiratory ARREST Do not perform Intubation, CPR, defibrillation or ACLS   In the event of cardiac or respiratory ARREST Use medication by any route, position, wound care, and other measures to relive pain and suffering. May use oxygen, suction and manual treatment of airway obstruction as needed for comfort.      11/29/15 2902    Code Status History    Date Active Date Inactive Code Status Order ID Comments User Context   11/27/2015  6:05 PM 11/29/2015  9:03 AM Full Code [id number]  Dustin Flock, MD Inpatient   11/22/2015  4:47 PM 11/26/2015  8:30 PM Full Code [id number]  Epifanio Lesches, MD ED   11/04/2015 11:40 AM 11/06/2015  3:15 AM Full Code [id number]  Idelle Crouch, MD Inpatient   12/25/2014  4:39 AM 12/26/2014  6:04 PM Full Code [id number]  Harrie Foreman, MD Inpatient    Advance Directive Documentation   Flowsheet Row Most Recent Value  Type of Advance Directive  Living will, Healthcare Power of Attorney  Pre-existing out of facility DNR order (yellow form or pink MOST form)  No data  "MOST" Form in Place?  No data     Family Communication: wife at the bedside Disposition Plan: patient is unsure that he wants to go home and may want to go to rehabilitation.  Consultants:  Oncology  Pulmonary  Time spent: total time today 30 minutes.  Loletha Grayer  [employer]

## 2015-12-02 NOTE — Progress Notes (Signed)
[facility] Hematology/Oncology Progress Note  Date of admission: 11/27/2015  Hospital day:  12/02/2015   Chief Complaint: Darryl Hatfield is a 55 y.o. 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  Subjective:  Shortness of breath with any activity. Constipation.  Social History: The patient is accompanied by his wife today.  Allergies:  Allergies  Allergen Reactions  . No Known Allergies     Scheduled Medications: . acetylcysteine  2 mL Nebulization BID  . aspirin EC  81 mg Oral Daily  . atorvastatin  80 mg Oral Daily  . calcium-vitamin D  1 tablet Oral BID  . docusate sodium  100 mg Oral BID  . DULoxetine  60 mg Oral Daily  . [START ON 12/03/2015] fentaNYL  25 mcg Transdermal Q72H  . folic acid  1 mg Oral Daily  . gabapentin  100 mg Oral TID  . guaiFENesin  600 mg Oral BID  . Influenza vac split quadrivalent PF  0.5 mL Intramuscular Tomorrow-1000  . lactulose  30 g Oral TID  . levalbuterol  1.25 mg Nebulization Q6H  . levothyroxine  44 mcg Oral QAC breakfast  . LORazepam  0.5 mg Oral QHS  . methylPREDNISolone (SOLU-MEDROL) injection  40 mg Intravenous Q6H  . metoprolol succinate  25 mg Oral BID  . mometasone-formoterol  2 puff Inhalation BID  . oxyCODONE  10 mg Oral Q6H  . pantoprazole  40 mg Oral Daily  . polyethylene glycol  17 g Oral Daily  . QUEtiapine  50 mg Oral Daily  . senna  2 tablet Oral Daily  . sodium chloride flush  3 mL Intravenous Q12H  . sodium chloride flush  3 mL Intravenous Q12H  . tiotropium  18 mcg Inhalation Daily    Review of Systems: GENERAL: Fatigued. No fevers, chills, or sweats.  PERFORMANCE STATUS (ECOG): 2 HEENT: No visual changes, runny nose, sore throat, mouth sores or tenderness. Lungs: Shortness of breath on 4 liters/min [place] with any activity. Cough.  No  hemoptysis. Cardiac: No chest pain, palpitations, orthopnea, or PND. GI: No nausea, vomiting, constipation, diarrhea, melena or hematochezia.  GU: No urgency, frequency, dysuria, or hematuria. Musculoskeletal: Right sided chest and rib pain (chronic). Back pain. No muscle tenderness. Extremities: No swelling or pain. Skin: Bruising on arms and abdomen. No rashes. Neuro: No headache, numbness or weakness, balance or coordination issues. Endocrine: No diabetes. Thyroid disease on Synthroid. No hot flashes or night sweats. Psych: Anxiety. No mood changes or depression. Pain: Right sided chest discomfort. Review of systems: All other systems reviewed and found to be negative.  Physical Exam: Blood pressure 128/83, pulse 91, temperature 97.6 F (36.4 C), temperature source Oral, resp. rate 20, height 5' 8"  (1.727 m), weight 133 lb 14.4 oz (60.7 kg), SpO2 92 %.  GENERAL:Thin gentleman sitting up on the medical unit in no acute distress.  MENTAL STATUS: Alert and oriented to person, place and time. HEAD:Black hair with slight graying. Mustache. Normocephalic, atraumatic, face symmetric, no Cushingoid features. EYES:Glasses. Blue eyes. Pupils equal round and reactive to light and accomodation. No conjunctivitis or scleral icterus. ENT:[place] in place.  Oropharynx clear without lesion. Dentures. Tonguenormal. Mucous membranes moist. RESPIRATORY: Coarse breath sounds throughout.  No wheezes.  CARDIOVASCULAR:Regular rate andrhythmwithout murmur, rub or gallop. ABDOMEN:Soft,non-tender with active bowel sounds and no hepatosplenomegaly. No masses. SKIN: Ecchymosis on upper extremities (left >  right).  EXTREMITIES: Thin arms. No lower extremity edema. No skin discoloration or tenderness. No palpable cords. NEUROLOGICAL: Unremarkable. PSYCH: Anxious.    Results for orders placed or performed during the hospital encounter of 11/27/15 (from the past 48 hour(s))   CBC     Status: Abnormal   Collection Time: 12/01/15  9:56 AM  Result Value Ref Range   WBC 13.7 (H) 3.8 - 10.6 K/uL   RBC 3.55 (L) 4.40 - 5.90 MIL/uL   Hemoglobin 10.3 (L) 13.0 - 18.0 g/dL   HCT 31.2 (L) 40.0 - 52.0 %   MCV 88.0 80.0 - 100.0 fL   MCH 29.0 26.0 - 34.0 pg   MCHC 32.9 32.0 - 36.0 g/dL   RDW 17.1 (H) 11.5 - 14.5 %   Platelets 89 (L) 150 - 440 K/uL  Comprehensive metabolic panel     Status: Abnormal   Collection Time: 12/01/15  9:56 AM  Result Value Ref Range   Sodium 138 135 - 145 mmol/L   Potassium 4.6 3.5 - 5.1 mmol/L   Chloride 95 (L) 101 - 111 mmol/L   CO2 33 (H) 22 - 32 mmol/L   Glucose, Bld 97 65 - 99 mg/dL   BUN 34 (H) 6 - 20 mg/dL   Creatinine, Ser 1.05 0.61 - 1.24 mg/dL   Calcium 10.0 8.9 - 10.3 mg/dL   Total Protein 6.0 (L) 6.5 - 8.1 g/dL   Albumin 3.1 (L) 3.5 - 5.0 g/dL   AST 38 15 - 41 U/L   ALT 19 17 - 63 U/L   Alkaline Phosphatase 162 (H) 38 - 126 U/L   Total Bilirubin 0.6 0.3 - 1.2 mg/dL   GFR calc non Af Amer >60 >60 mL/min   GFR calc Af Amer >60 >60 mL/min    Comment: (NOTE) The eGFR has been calculated using the CKD EPI equation. This calculation has not been validated in all clinical situations. eGFR's persistently <60 mL/min signify possible Chronic Kidney Disease.    Anion gap 10 5 - 15  Procalcitonin     Status: None   Collection Time: 12/02/15  4:30 AM  Result Value Ref Range   Procalcitonin 0.13 ng/mL    Comment:        Interpretation: PCT (Procalcitonin) <= 0.5 ng/mL: Systemic infection (sepsis) is not likely. Local bacterial infection is possible. (NOTE)         ICU PCT Algorithm               Non ICU PCT Algorithm    ----------------------------     ------------------------------         PCT < 0.25 ng/mL                 PCT < 0.1 ng/mL     Stopping of antibiotics            Stopping of antibiotics       strongly encouraged.               strongly encouraged.    ----------------------------      ------------------------------       PCT level decrease by               PCT < 0.25 ng/mL       >= 80% from peak PCT       OR PCT 0.25 - 0.5 ng/mL          Stopping of antibiotics  encouraged.     Stopping of antibiotics           encouraged.    ----------------------------     ------------------------------       PCT level decrease by              PCT >= 0.25 ng/mL       < 80% from peak PCT        AND PCT >= 0.5 ng/mL            Continuin g antibiotics                                              encouraged.       Continuing antibiotics            encouraged.    ----------------------------     ------------------------------     PCT level increase compared          PCT > 0.5 ng/mL         with peak PCT AND          PCT >= 0.5 ng/mL             Escalation of antibiotics                                          strongly encouraged.      Escalation of antibiotics        strongly encouraged.   CBC with Differential     Status: Abnormal   Collection Time: 12/02/15  4:30 AM  Result Value Ref Range   WBC 11.2 (H) 3.8 - 10.6 K/uL   RBC 3.53 (L) 4.40 - 5.90 MIL/uL   Hemoglobin 10.4 (L) 13.0 - 18.0 g/dL   HCT 31.5 (L) 40.0 - 52.0 %   MCV 89.3 80.0 - 100.0 fL   MCH 29.5 26.0 - 34.0 pg   MCHC 33.0 32.0 - 36.0 g/dL   RDW 16.8 (H) 11.5 - 14.5 %   Platelets 80 (L) 150 - 440 K/uL   Neutrophils Relative % 79 %   Lymphocytes Relative 8 %   Monocytes Relative 7 %   Eosinophils Relative 0 %   Basophils Relative 0 %   Band Neutrophils 2 %   Metamyelocytes Relative 1 %   Myelocytes 3 %   Promyelocytes Absolute 0 %   Blasts 0 %   nRBC 1 (H) 0 /100 WBC   Other 0 %   Neutro Abs 9.5 (H) 1.4 - 6.5 K/uL   Lymphs Abs 0.9 (L) 1.0 - 3.6 K/uL   Monocytes Absolute 0.8 0.2 - 1.0 K/uL   Eosinophils Absolute 0.0 0 - 0.7 K/uL   Basophils Absolute 0.0 0 - 0.1 K/uL   RBC Morphology MIXED RBC POPULATION    Smear Review LARGE PLATELETS PRESENT   Protime-INR      Status: Abnormal   Collection Time: 12/02/15  4:30 AM  Result Value Ref Range   Prothrombin Time 30.2 (H) 11.4 - 15.2 seconds   INR 2.81   APTT     Status: Abnormal   Collection Time: 12/02/15  4:30 AM  Result Value Ref Range   aPTT 47 (H) 24 - 36 seconds    Comment:  IF BASELINE aPTT IS ELEVATED, SUGGEST PATIENT RISK ASSESSMENT BE USED TO DETERMINE APPROPRIATE ANTICOAGULANT THERAPY.   Fibrinogen     Status: Abnormal   Collection Time: 12/02/15  4:30 AM  Result Value Ref Range   Fibrinogen <60 (LL) 210 - 475 mg/dL    Comment: RESULT REPEATED AND VERIFIED CRITICAL RESULT CALLED TO, READ BACK BY AND VERIFIED WITH: Corine Shelter AT 0630 12/02/15.PMH    No results found.  Assessment:  Darryl Hatfield is a 55 y.o. 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    Chest CT angiogram on 11/27/2015 revealed no evidence of a pulmonary embolus and no acute findings in the chest.  There is no evidence of lymphangitic spread of tumor.  When reviewing current films and CT scans from earlier this summer (when patient was not on oxygen), he has more central prominence/fullness with narrowing of airways.  Thrombocytopenia.  Coagulation studies reveal an elevated PT, PTT and decreased fibrinogen consistent with DIC.  Plan: 1.  Oncology:  Metastatic lung cancer.  Patient considering Hospice versus treatment.  Patient has extremely limited reserve.  Bronchoscopy unnable to be performed yesterday secondary to thrombocytopenia.  Images reveal progressive lung cancer disease outside of chest.  Central progression in chest likely secondary to squamous cell lung cancer.  Patient also has metastatic adenocarcinoma.    Patient concerned about going home and difficulty breathing.  Discussed possible care facility or home with home health and physical therapy.  2.   Hematology:  Thrombocytopenia.  Concern regarding consumption.  Elevated coagulation studies and decreased fibrinogen worrisome for DIC.  Will repeat labs and consider transfusion of FFP + fibrinogen.  Consider reinstitution of broad spectrum antibiotics.  Will discuss with patient and his wife.  3.  Pulmonary:  Bronchoscopy postponed yesterday secondary to thrombocytopenia.  4.  Code status:  DNR.   Lequita Asal, MD  12/02/2015, 11:12 AM

## 2015-12-02 NOTE — NC FL2 (Signed)
[place] LEVEL OF CARE SCREENING TOOL     IDENTIFICATION  Patient Name: Darryl Hatfield Birthdate: 09/10/1960 Sex: male Admission Date (Current Location): 11/27/2015  [place] and [place] Number:  Engineering geologist and Address:  [facility], [address], [place], [place] [address]      Provider Number: [id number]  Attending Physician Name and Address:  Loletha Grayer, MD  Relative Name and Phone Number:       Current Level of Care: Hospital Recommended Level of Care: [employer] Prior Approval Number:    Date Approved/Denied:   PASRR Number: [id number]  Discharge Plan: SNF    Current Diagnoses: Patient Active Problem List   Diagnosis Date Noted  . DNR (do not resuscitate)   . Primary malignant neoplasm of lung metastatic to other site [facility])   . Hemoptysis   . Encounter for hospice care discussion   . SOB (shortness of breath) 11/27/2015  . Acute respiratory failure with hypoxemia ([place]) 11/22/2015  . Bone metastasis ([place]) 11/17/2015  . Symptomatic anemia 11/04/2015  . Other chest pain 11/04/2015  . Tachycardia 11/04/2015  . Pulmonary embolism without acute cor pulmonale ([place]) 10/07/2015  . Acute deep vein thrombosis (DVT) of femoral vein of both lower extremities ([place]) 10/07/2015  . Colitis 10/07/2015  . Dehydration 09/28/2015  . Epigastric discomfort 09/16/2015  . STEMI (ST elevation myocardial infarction) ([place]) 09/16/2015  . Pruritus 07/27/2015  . Constipation 06/05/2015  . Anxiety 05/12/2015  . Weight loss 05/12/2015  . 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 Adenocarcinoma of lung, stage 4 ([place]) 04/25/2015  . Iron deficiency anemia 04/05/2015  . Episodes of formed visual hallucinations 12/26/2014  . Dementia with behavioral disturbance 12/26/2014  . Delirium 12/25/2014  . Cancer of upper lobe of right lung ([place]) 07/02/2013  . SCIATICA 03/01/2010    Orientation RESPIRATION BLADDER  Height & Weight     Self, Situation, Time, Place  O2 (O2 4L) Continent Weight: 133 lb 14.4 oz (60.7 kg) Height:  '5\' 8"'$  (172.7 cm)  BEHAVIORAL SYMPTOMS/MOOD NEUROLOGICAL BOWEL NUTRITION STATUS      Continent    AMBULATORY STATUS COMMUNICATION OF NEEDS Skin   Extensive Assist Verbally Normal                       Personal Care Assistance Level of Assistance  Bathing, Dressing Bathing Assistance: Maximum assistance   Dressing Assistance: Maximum assistance     Functional Limitations Info             SPECIAL CARE FACTORS FREQUENCY                       Contractures Contractures Info: Present    Additional Factors Info  Code Status Code Status Info: DNR             Current Medications (12/02/2015):  This is the current hospital active medication list Current Facility-Administered Medications  Medication Dose Route Frequency Provider Last Rate Last Dose  . 0.9 %  sodium chloride infusion  250 mL Intravenous PRN Dustin Flock, MD      . acetaminophen (TYLENOL) tablet 650 mg  650 mg Oral Q6H PRN Dustin Flock, MD       Or  . acetaminophen (TYLENOL) suppository 650 mg  650 mg Rectal Q6H PRN Dustin Flock, MD      . acetylcysteine (MUCOMYST) 20 % nebulizer / oral solution 2 mL  2 mL Nebulization  BID Dustin Flock, MD   2 mL at 12/02/15 0746  . aspirin EC tablet 81 mg  81 mg Oral Daily Dustin Flock, MD   81 mg at 12/02/15 1033  . atorvastatin (LIPITOR) tablet 80 mg  80 mg Oral Daily Dustin Flock, MD   80 mg at 12/02/15 1032  . calcium-vitamin D (OSCAL WITH D) 500-200 MG-UNIT per tablet 1 tablet  1 tablet Oral BID Dustin Flock, MD   1 tablet at 12/02/15 1033  . docusate sodium (COLACE) capsule 100 mg  100 mg Oral BID Dustin Flock, MD   100 mg at 12/02/15 1034  . DULoxetine (CYMBALTA) DR capsule 60 mg  60 mg Oral Daily Dustin Flock, MD   60 mg at 12/02/15 1034  . [START ON 12/03/2015] fentaNYL (DURAGESIC - dosed mcg/hr) patch 25 mcg  25 mcg Transdermal  Q72H Marianne L York, PA-C      . folic acid (FOLVITE) tablet 1 mg  1 mg Oral Daily Dustin Flock, MD   1 mg at 12/02/15 1033  . gabapentin (NEURONTIN) capsule 100 mg  100 mg Oral TID Dustin Flock, MD   100 mg at 12/02/15 1033  . guaiFENesin (MUCINEX) 12 hr tablet 600 mg  600 mg Oral BID Lytle Butte, MD   600 mg at 12/02/15 1033  . hydrOXYzine (ATARAX/VISTARIL) tablet 10 mg  10 mg Oral TID PRN Dustin Flock, MD   10 mg at 11/29/15 1530  . Influenza vac split quadrivalent PF (FLUARIX) injection 0.5 mL  0.5 mL Intramuscular Tomorrow-1000 Dustin Flock, MD   Stopped at 11/28/15 [phone]  . lactulose (CHRONULAC) 10 GM/15ML solution 30 g  30 g Oral TID Loletha Grayer, MD   30 g at 12/02/15 1032  . levalbuterol (XOPENEX) nebulizer solution 1.25 mg  1.25 mg Nebulization Q6H Dustin Flock, MD   1.25 mg at 12/02/15 1537  . levothyroxine (SYNTHROID, LEVOTHROID) tablet 44 mcg  44 mcg Oral QAC breakfast Dustin Flock, MD   44 mcg at 12/02/15 0824  . LORazepam (ATIVAN) tablet 0.5 mg  0.5 mg Oral Q8H PRN Dustin Flock, MD   0.5 mg at 12/01/15 1802  . LORazepam (ATIVAN) tablet 0.5 mg  0.5 mg Oral QHS Melton Alar, PA-C   0.5 mg at 12/01/15 2043  . methylPREDNISolone sodium succinate (SOLU-MEDROL) 40 mg/mL injection 40 mg  40 mg Intravenous Q6H Allyne Gee, MD   40 mg at 12/02/15 0824  . metoCLOPramide (REGLAN) tablet 10 mg  10 mg Oral Q6H PRN Dustin Flock, MD      . metoprolol succinate (TOPROL-XL) 24 hr tablet 25 mg  25 mg Oral BID Dustin Flock, MD   25 mg at 12/02/15 1033  . mometasone-formoterol (DULERA) 100-5 MCG/ACT inhaler 2 puff  2 puff Inhalation BID Dustin Flock, MD   2 puff at 12/02/15 1032  . morphine 4 MG/ML injection 1-2 mg  1-2 mg Intravenous Q4H PRN Dustin Flock, MD      . morphine CONCENTRATE 10 MG/0.5ML oral solution 10 mg  10 mg Oral Q2H PRN Loletha Grayer, MD   10 mg at 12/02/15 1251  . nitroGLYCERIN (NITROSTAT) SL tablet 0.4 mg  0.4 mg Sublingual Q5 min PRN Dustin Flock,  MD      . ondansetron (ZOFRAN) tablet 8 mg  8 mg Oral Q8H PRN Dustin Flock, MD   8 mg at 12/02/15 1233  . oxyCODONE (Oxy IR/ROXICODONE) immediate release tablet 10 mg  10 mg Oral Q6H Melton Alar, PA-C  10 mg at 12/02/15 1233  . pantoprazole (PROTONIX) EC tablet 40 mg  40 mg Oral Daily Dustin Flock, MD   40 mg at 12/02/15 1034  . piperacillin-tazobactam (ZOSYN) IVPB 3.375 g  3.375 g Intravenous Q8H Melissa D Maccia, RPH   3.375 g at 12/02/15 1418  . polyethylene glycol (MIRALAX / GLYCOLAX) packet 17 g  17 g Oral Daily Dustin Flock, MD   17 g at 12/02/15 1032  . QUEtiapine (SEROQUEL) tablet 50 mg  50 mg Oral Daily Dustin Flock, MD   50 mg at 12/01/15 2041  . senna (SENOKOT) tablet 17.2 mg  2 tablet Oral Daily Melton Alar, PA-C   17.2 mg at 12/02/15 1034  . sodium chloride flush (NS) 0.9 % injection 3 mL  3 mL Intravenous Q12H Dustin Flock, MD   3 mL at 12/01/15 2200  . sodium chloride flush (NS) 0.9 % injection 3 mL  3 mL Intravenous Q12H Dustin Flock, MD   3 mL at 12/02/15 1038  . sodium chloride flush (NS) 0.9 % injection 3 mL  3 mL Intravenous PRN Dustin Flock, MD      . tiotropium (SPIRIVA) inhalation capsule 18 mcg  18 mcg Inhalation Daily Allyne Gee, MD   18 mcg at 12/02/15 0825  . vancomycin (VANCOCIN) IVPB 750 mg/150 ml premix  750 mg Intravenous Q12H Melissa D Maccia, RPH       Facility-Administered Medications Ordered in Other Encounters  Medication Dose Route Frequency Provider Last Rate Last Dose  . sodium chloride 0.9 % injection 10 mL  10 mL Intracatheter PRN Forest Gleason, MD   10 mL at 06/27/14 1338  . sodium chloride 0.9 % injection 10 mL  10 mL Intracatheter PRN Leia Alf, MD   10 mL at 08/22/14 0851  . sodium chloride flush (NS) 0.9 % injection 10 mL  10 mL Intravenous PRN Lequita Asal, MD         Discharge Medications: Please see discharge summary for a list of discharge medications.  Relevant Imaging Results:  Relevant Lab  Results:   Additional Information  SS # [id number]  Zettie Pho, LCSW

## 2015-12-02 NOTE — Consult Note (Signed)
Pharmacy Antibiotic Note  Darryl Hatfield is a 55 y.o. male admitted on 11/27/2015 with metastatic lung cancer with thrombocytopenia concerning for DIC.  Pharmacy has been consulted for vancomycin and zosyn dosing per recommendations of oncology.  Plan: Vancomycin 1g once then give next dose in 6 hours for stacked dosing. Vancomycin '750mg'$  IV every 12 hours.  Goal trough 15-20 mcg/mL. Zosyn 3.375g IV q8h (4 hour infusion).  Trough prior to the 5th dose 11/13 @ 0600  Height: '5\' 8"'$  (172.7 cm) Weight: 133 lb 14.4 oz (60.7 kg) IBW/kg (Calculated) : 68.4  Temp (24hrs), Avg:97.8 F (36.6 C), Min:97.5 F (36.4 C), Max:98.2 F (36.8 C)   Recent Labs Lab 11/27/15 1257 11/28/15 0342 12/01/15 0956 12/02/15 0430  WBC 8.4 5.0 13.7* 11.2*  CREATININE 1.09 1.17 1.05  --     Estimated Creatinine Clearance: 67.4 mL/min (by C-G formula based on SCr of 1.05 mg/dL).    Allergies  Allergen Reactions  . No Known Allergies     Antimicrobials this admission: levofloxacin 11/6 >> 11/10 vancomycin 11/11 >>  Zosyn 11/11>>  Dose adjustments this admission:   Microbiology results: 11/1 BCx: No growth   Thank you for allowing pharmacy to be a part of this patient's care.  Ramond Dial 12/02/2015 11:21 AM

## 2015-12-03 DIAGNOSIS — R791 Abnormal coagulation profile: Secondary | ICD-10-CM

## 2015-12-03 LAB — CBC WITH DIFFERENTIAL/PLATELET
Band Neutrophils: 1 %
Basophils Absolute: 0 10*3/uL (ref 0–0.1)
Basophils Relative: 0 %
Blasts: 0 %
Eosinophils Absolute: 0 10*3/uL (ref 0–0.7)
Eosinophils Relative: 0 %
HCT: 31.3 % — ABNORMAL LOW (ref 40.0–52.0)
Hemoglobin: 10.2 g/dL — ABNORMAL LOW (ref 13.0–18.0)
Lymphocytes Relative: 6 %
Lymphs Abs: 0.9 10*3/uL — ABNORMAL LOW (ref 1.0–3.6)
MCH: 28.6 pg (ref 26.0–34.0)
MCHC: 32.4 g/dL (ref 32.0–36.0)
MCV: 88.2 fL (ref 80.0–100.0)
Metamyelocytes Relative: 3 %
Monocytes Absolute: 0.9 10*3/uL (ref 0.2–1.0)
Monocytes Relative: 6 %
Myelocytes: 1 %
Neutro Abs: 14 10*3/uL — ABNORMAL HIGH (ref 1.4–6.5)
Neutrophils Relative %: 83 %
Other: 0 %
Platelets: 77 10*3/uL — ABNORMAL LOW (ref 150–440)
Promyelocytes Absolute: 0 %
RBC: 3.55 MIL/uL — ABNORMAL LOW (ref 4.40–5.90)
RDW: 17.1 % — ABNORMAL HIGH (ref 11.5–14.5)
WBC: 15.8 10*3/uL — ABNORMAL HIGH (ref 3.8–10.6)
nRBC: 0 /100 WBC

## 2015-12-03 LAB — BASIC METABOLIC PANEL
ANION GAP: 11 (ref 5–15)
BUN: 34 mg/dL — ABNORMAL HIGH (ref 6–20)
CO2: 31 mmol/L (ref 22–32)
Calcium: 9.2 mg/dL (ref 8.9–10.3)
Chloride: 95 mmol/L — ABNORMAL LOW (ref 101–111)
Creatinine, Ser: 1.2 mg/dL (ref 0.61–1.24)
Glucose, Bld: 120 mg/dL — ABNORMAL HIGH (ref 65–99)
POTASSIUM: 4.2 mmol/L (ref 3.5–5.1)
SODIUM: 137 mmol/L (ref 135–145)

## 2015-12-03 LAB — PROTIME-INR
INR: 2.08
Prothrombin Time: 23.7 seconds — ABNORMAL HIGH (ref 11.4–15.2)

## 2015-12-03 LAB — FIBRINOGEN: Fibrinogen: 60 mg/dL — CL (ref 210–475)

## 2015-12-03 LAB — CK: CK TOTAL: 24 U/L — AB (ref 49–397)

## 2015-12-03 LAB — HEPARIN INDUCED PLATELET AB (HIT ANTIBODY): Heparin Induced Plt Ab: 0.634 OD — ABNORMAL HIGH (ref 0.000–0.400)

## 2015-12-03 LAB — FIBRIN DERIVATIVES D-DIMER (ARMC ONLY): Fibrin derivatives D-dimer (ARMC): 6679 — ABNORMAL HIGH (ref 0–499)

## 2015-12-03 MED ORDER — LACTULOSE 10 GM/15ML PO SOLN: 30.0000 g | Freq: Two times a day (BID) | ORAL | Status: DC | PRN

## 2015-12-03 MED ORDER — METOPROLOL SUCCINATE ER 25 MG PO TB24
25.0000 mg | ORAL_TABLET | Freq: Every day | ORAL | Status: DC
  Administered 2015-12-03 – 2015-12-05 (×3): 25 mg via ORAL
  Filled 2015-12-03 (×3): qty 1

## 2015-12-03 MED ORDER — LEVALBUTEROL HCL 1.25 MG/0.5ML IN NEBU
1.2500 mg | INHALATION_SOLUTION | Freq: Three times a day (TID) | RESPIRATORY_TRACT | Status: DC
  Administered 2015-12-04 – 2015-12-05 (×4): 1.25 mg via RESPIRATORY_TRACT
  Filled 2015-12-03 (×4): qty 0.5

## 2015-12-03 MED ORDER — SODIUM CHLORIDE 0.9% FLUSH
10.0000 mL | Freq: Two times a day (BID) | INTRAVENOUS | Status: DC
  Administered 2015-12-03 – 2015-12-05 (×5): 10 mL

## 2015-12-03 NOTE — Progress Notes (Signed)
Patient ID: Darryl Hatfield, male   DOB: 16-Feb-1960, 55 y.o.   MRN: [id number]  Patient ID: Darryl Hatfield, male   DOB: August 06, 1960, 55 y.o.   MRN: [id number]  [employer] PROGRESS NOTE  Darryl Hatfield TFT:[id number] DOB: Aug 31, 1960 DOA: 11/27/2015 PCP: Marinda Elk, MD  HPI/Subjective: Patient is feeling very weak and short of breath.. Limited in his exercise capacity.  Objective: Vitals:   12/02/15 2027 12/03/15 0537  BP: 135/83 121/77  Pulse: 89 81  Resp: 18 18  Temp: 98.2 F (36.8 C) 97.9 F (36.6 C)    Filed Weights   11/27/15 1245 11/27/15 1753 11/28/15 0406  Weight: 62.1 kg (137 lb) 61.7 kg (136 lb) 60.7 kg (133 lb 14.4 oz)    ROS: Review of Systems  Constitutional: Negative for chills and fever.  Eyes: Negative for blurred vision.  Respiratory: Positive for cough and shortness of breath. Negative for sputum production.   Cardiovascular: Negative for chest pain.  Gastrointestinal: Positive for nausea. Negative for abdominal pain, constipation, diarrhea and vomiting.  Genitourinary: Negative for dysuria.  Musculoskeletal: Negative for joint pain.  Neurological: Negative for dizziness and headaches.   Exam: Physical Exam  Constitutional: He is oriented to person, place, and time.  HENT:  Nose: No mucosal edema.  Mouth/Throat: No oropharyngeal exudate or posterior oropharyngeal edema.  Eyes: Conjunctivae, EOM and lids are normal. Pupils are equal, round, and reactive to light.  Neck: No JVD present. Carotid bruit is not present. No edema present. No thyroid mass and no thyromegaly present.  Cardiovascular: S1 normal and S2 normal.  Exam reveals no gallop.   No murmur heard. Pulses:      Dorsalis pedis pulses are 2+ on the right side, and 2+ on the left side.  Respiratory: No respiratory distress. He has decreased breath sounds in the right middle field, the right lower field, the left middle field and the left lower field. He has no wheezes. He has  rhonchi in the right lower field, the left upper field and the left lower field. He has no rales.  GI: Soft. Bowel sounds are normal. There is no tenderness.  Musculoskeletal:       Right ankle: He exhibits no swelling.       Left ankle: He exhibits no swelling.  Lymphadenopathy:    He has no cervical adenopathy.  Neurological: He is alert and oriented to person, place, and time. No cranial nerve deficit.  Skin: Skin is warm. No rash noted. Nails show no clubbing.  Psychiatric: He has a normal mood and affect.      Data Reviewed: Basic Metabolic Panel:  Recent Labs Lab 11/27/15 1257 11/28/15 0342 12/01/15 0956 12/03/15 0710  NA 136 139 138 137  K 4.2 4.6 4.6 4.2  CL 93* 97* 95* 95*  CO2 32 33* 33* 31  GLUCOSE 81 144* 97 120*  BUN 21* 20 34* 34*  CREATININE 1.09 1.17 1.05 1.20  CALCIUM 10.2 9.3 10.0 9.2   Liver Function Tests:  Recent Labs Lab 12/01/15 0956  AST 38  ALT 19  ALKPHOS 162*  BILITOT 0.6  PROT 6.0*  ALBUMIN 3.1*   CBC:  Recent Labs Lab 11/27/15 1257 11/28/15 0342 12/01/15 0956 12/02/15 0430 12/03/15 0710  WBC 8.4 5.0 13.7* 11.2* 15.8*  NEUTROABS  --   --   --  9.5* PENDING  HGB 10.5* 9.8* 10.3* 10.4* 10.2*  HCT 31.7* 30.4* 31.2* 31.5* 31.3*  MCV 88.2 89.8 88.0 89.3 88.2  PLT 119* 92* 89* 80* 77*   Cardiac Enzymes:  Recent Labs Lab 11/27/15 1257  TROPONINI <0.03   BNP (last 3 results)  Recent Labs  09/16/15 1917  BNP 97.0      Scheduled Meds: . acetylcysteine  2 mL Nebulization BID  . aspirin EC  81 mg Oral Daily  . atorvastatin  80 mg Oral Daily  . calcium-vitamin D  1 tablet Oral BID  . docusate sodium  100 mg Oral BID  . DULoxetine  60 mg Oral Daily  . fentaNYL  25 mcg Transdermal Q72H  . folic acid  1 mg Oral Daily  . gabapentin  100 mg Oral TID  . guaiFENesin  600 mg Oral BID  . Influenza vac split quadrivalent PF  0.5 mL Intramuscular Tomorrow-1000  . levalbuterol  1.25 mg Nebulization Q6H  . levothyroxine  44  mcg Oral QAC breakfast  . LORazepam  0.5 mg Oral QHS  . methylPREDNISolone (SOLU-MEDROL) injection  40 mg Intravenous Q6H  . metoprolol succinate  25 mg Oral BID  . mometasone-formoterol  2 puff Inhalation BID  . oxyCODONE  10 mg Oral Q6H  . pantoprazole  40 mg Oral Daily  . piperacillin-tazobactam (ZOSYN)  IV  3.375 g Intravenous Q8H  . polyethylene glycol  17 g Oral Daily  . QUEtiapine  50 mg Oral Daily  . senna  2 tablet Oral Daily  . sodium chloride flush  10-40 mL Intracatheter Q12H  . sodium chloride flush  3 mL Intravenous Q12H  . sodium chloride flush  3 mL Intravenous Q12H  . tiotropium  18 mcg Inhalation Daily  . vancomycin  750 mg Intravenous Q12H    Assessment/Plan:  1. Acute on chronic hypoxic respiratory failure with worsening respiratory status. Continue oxygen supplementation. Patient did not like Roxanol for symptom management. Try ativan for symptom management. 2. DIC unclear etiology. Started zosyn and vancomycinyesterday. Oncology to consider cryoprecipitate. Supportive care.  3. Metastatic lung cancer.  Overall prognosis is poor. Patient already a DO NOT RESUSCITATE. Patient will be a candidate for hospice if he decides on no further chemotherapy. 4. COPD exacerbation. IV Solu-Medrol and nebulizer treatments.   5. Hemoptysis. Resolved 6. Constipation. Had bowel movement yesterday 7. Hyperlipidemia unspecified. Stop statin 8. Hypothyroidism unspecified. Continue levothyroxine.  Code Status:     Code Status Orders        Start     Ordered   11/29/15 0904  Do not attempt resuscitation (DNR)  Continuous    Question Answer Comment  In the event of cardiac or respiratory ARREST Do not call a "code blue"   In the event of cardiac or respiratory ARREST Do not perform Intubation, CPR, defibrillation or ACLS   In the event of cardiac or respiratory ARREST Use medication by any route, position, wound care, and other measures to relive pain and suffering. May use  oxygen, suction and manual treatment of airway obstruction as needed for comfort.      11/29/15 4696    Code Status History    Date Active Date Inactive Code Status Order ID Comments User Context   11/27/2015  6:05 PM 11/29/2015  9:03 AM Full Code [id number]  Dustin Flock, MD Inpatient   11/22/2015  4:47 PM 11/26/2015  8:30 PM Full Code [id number]  Epifanio Lesches, MD ED   11/04/2015 11:40 AM 11/06/2015  3:15 AM Full Code [id number]  Idelle Crouch, MD Inpatient   12/25/2014  4:39 AM 12/26/2014  6:04 PM Full Code  [id number]  Harrie Foreman, MD Inpatient    Advance Directive Documentation   Flowsheet Row Most Recent Value  Type of Advance Directive  Living will, Healthcare Power of Attorney  Pre-existing out of facility DNR order (yellow form or pink MOST form)  No data  "MOST" Form in Place?  No data     Family Communication: wife on the phone Disposition Plan:  DIC will have to resolve prior to disposition  Consultants:  Oncology  Pulmonary  Time spent: total time today 25 minutes.  Loletha Grayer  [employer]

## 2015-12-03 NOTE — Progress Notes (Signed)
[facility] Hematology/Oncology Progress Note  Date of admission: 11/27/2015  Hospital day:  12/03/2015   Chief Complaint: Darryl Hatfield is a 55 y.o. 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  Subjective:  Shortness of breath with any activity.  Diarrhea x 2.  Fatigue.  Social History: The patient is accompanied by his wife and son today.  Allergies:  Allergies  Allergen Reactions  . No Known Allergies     Scheduled Medications: . acetylcysteine  2 mL Nebulization BID  . aspirin EC  81 mg Oral Daily  . calcium-vitamin D  1 tablet Oral BID  . docusate sodium  100 mg Oral BID  . DULoxetine  60 mg Oral Daily  . fentaNYL  25 mcg Transdermal Q72H  . folic acid  1 mg Oral Daily  . gabapentin  100 mg Oral TID  . guaiFENesin  600 mg Oral BID  . Influenza vac split quadrivalent PF  0.5 mL Intramuscular Tomorrow-1000  . levalbuterol  1.25 mg Nebulization Q6H  . levothyroxine  44 mcg Oral QAC breakfast  . LORazepam  0.5 mg Oral QHS  . methylPREDNISolone (SOLU-MEDROL) injection  40 mg Intravenous Q6H  . metoprolol succinate  25 mg Oral Daily  . mometasone-formoterol  2 puff Inhalation BID  . oxyCODONE  10 mg Oral Q6H  . pantoprazole  40 mg Oral Daily  . piperacillin-tazobactam (ZOSYN)  IV  3.375 g Intravenous Q8H  . polyethylene glycol  17 g Oral Daily  . QUEtiapine  50 mg Oral Daily  . senna  2 tablet Oral Daily  . sodium chloride flush  10-40 mL Intracatheter Q12H  . sodium chloride flush  3 mL Intravenous Q12H  . sodium chloride flush  3 mL Intravenous Q12H  . tiotropium  18 mcg Inhalation Daily  . vancomycin  750 mg Intravenous Q12H    Review of Systems: GENERAL: Fatigue. No fevers, chills, or sweats.  PERFORMANCE STATUS (ECOG): 2 HEENT: No visual changes, runny nose, sore throat, mouth sores or  tenderness. Lungs: Shortness of breath on 4 liters/min [place] with any activity. Cough.  No hemoptysis. Cardiac: No chest pain, palpitations, orthopnea, or PND. GI: Constipation has turned into diarrhea.  Little bit of blood on toilet paper after straining. No nausea, vomiting, melena or hematochezia.  GU: No urgency, frequency, dysuria, or hematuria. Musculoskeletal: Right sided chest and rib pain (chronic). Back pain. No muscle tenderness. Extremities: No swelling or pain. Skin: Bruising on arms and abdomen. No rashes. Neuro: No headache, numbness or weakness, balance or coordination issues. Endocrine: No diabetes. Thyroid disease on Synthroid. No hot flashes or night sweats. Psych: Anxiety. No mood changes or depression. Pain: Right sided chest discomfort. Review of systems: All other systems reviewed and found to be negative.  Physical Exam: Blood pressure 132/79, pulse (!) 103, temperature 98 F (36.7 C), temperature source Oral, resp. rate 20, height 5' 8" (1.727 m), weight 133 lb 14.4 oz (60.7 kg), SpO2 92 %.  GENERAL:Thin gentleman sitting up on the medical unit in no acute distress.  MENTAL STATUS: Alert and oriented to person, place and time. HEAD:Dark hair with slight graying. Mustache. Normocephalic, atraumatic, face symmetric, no Cushingoid features. EYES:Glasses. Blue eyes. Pupils equal round and reactive to light and accomodation. No conjunctivitis or scleral icterus. ENT:Petrey in place.  Oropharynx clear without lesion. Dentures. Tonguenormal. Mucous membranes moist. RESPIRATORY:  Coarse breath sounds throughout.  Soft wheezes.  CARDIOVASCULAR:Regular rate andrhythmwithout murmur, rub or gallop. ABDOMEN:Soft,non-tender with active bowel sounds and no hepatosplenomegaly. No masses. SKIN: Ecchymosis on upper extremities (left > right).  EXTREMITIES: Thin arms. No lower extremity edema. No skin discoloration or tenderness. No palpable  cords. NEUROLOGICAL: Unremarkable. PSYCH: Anxious.    Results for orders placed or performed during the hospital encounter of 11/27/15 (from the past 48 hour(s))  Procalcitonin     Status: None   Collection Time: 12/02/15  4:30 AM  Result Value Ref Range   Procalcitonin 0.13 ng/mL    Comment:        Interpretation: PCT (Procalcitonin) <= 0.5 ng/mL: Systemic infection (sepsis) is not likely. Local bacterial infection is possible. (NOTE)         ICU PCT Algorithm               Non ICU PCT Algorithm    ----------------------------     ------------------------------         PCT < 0.25 ng/mL                 PCT < 0.1 ng/mL     Stopping of antibiotics            Stopping of antibiotics       strongly encouraged.               strongly encouraged.    ----------------------------     ------------------------------       PCT level decrease by               PCT < 0.25 ng/mL       >= 80% from peak PCT       OR PCT 0.25 - 0.5 ng/mL          Stopping of antibiotics                                             encouraged.     Stopping of antibiotics           encouraged.    ----------------------------     ------------------------------       PCT level decrease by              PCT >= 0.25 ng/mL       < 80% from peak PCT        AND PCT >= 0.5 ng/mL            Continuin g antibiotics                                              encouraged.       Continuing antibiotics            encouraged.    ----------------------------     ------------------------------     PCT level increase compared          PCT > 0.5 ng/mL         with peak PCT AND          PCT >= 0.5 ng/mL             Escalation of antibiotics  strongly encouraged.      Escalation of antibiotics        strongly encouraged.   CBC with Differential     Status: Abnormal   Collection Time: 12/02/15  4:30 AM  Result Value Ref Range   WBC 11.2 (H) 3.8 - 10.6 K/uL   RBC 3.53 (L) 4.40 - 5.90  MIL/uL   Hemoglobin 10.4 (L) 13.0 - 18.0 g/dL   HCT 31.5 (L) 40.0 - 52.0 %   MCV 89.3 80.0 - 100.0 fL   MCH 29.5 26.0 - 34.0 pg   MCHC 33.0 32.0 - 36.0 g/dL   RDW 16.8 (H) 11.5 - 14.5 %   Platelets 80 (L) 150 - 440 K/uL   Neutrophils Relative % 79 %   Lymphocytes Relative 8 %   Monocytes Relative 7 %   Eosinophils Relative 0 %   Basophils Relative 0 %   Band Neutrophils 2 %   Metamyelocytes Relative 1 %   Myelocytes 3 %   Promyelocytes Absolute 0 %   Blasts 0 %   nRBC 1 (H) 0 /100 WBC   Other 0 %   Neutro Abs 9.5 (H) 1.4 - 6.5 K/uL   Lymphs Abs 0.9 (L) 1.0 - 3.6 K/uL   Monocytes Absolute 0.8 0.2 - 1.0 K/uL   Eosinophils Absolute 0.0 0 - 0.7 K/uL   Basophils Absolute 0.0 0 - 0.1 K/uL   RBC Morphology MIXED RBC POPULATION    Smear Review LARGE PLATELETS PRESENT   Protime-INR     Status: Abnormal   Collection Time: 12/02/15  4:30 AM  Result Value Ref Range   Prothrombin Time 30.2 (H) 11.4 - 15.2 seconds   INR 2.81   APTT     Status: Abnormal   Collection Time: 12/02/15  4:30 AM  Result Value Ref Range   aPTT 47 (H) 24 - 36 seconds    Comment:        IF BASELINE aPTT IS ELEVATED, SUGGEST PATIENT RISK ASSESSMENT BE USED TO DETERMINE APPROPRIATE ANTICOAGULANT THERAPY.   Fibrinogen     Status: Abnormal   Collection Time: 12/02/15  4:30 AM  Result Value Ref Range   Fibrinogen <60 (LL) 210 - 475 mg/dL    Comment: RESULT REPEATED AND VERIFIED CRITICAL RESULT CALLED TO, READ BACK BY AND VERIFIED WITH: Corine Shelter AT 0630 12/02/15.PMH   Protime-INR     Status: Abnormal   Collection Time: 12/02/15 11:23 AM  Result Value Ref Range   Prothrombin Time 31.6 (H) 11.4 - 15.2 seconds   INR 2.98   APTT     Status: Abnormal   Collection Time: 12/02/15 11:23 AM  Result Value Ref Range   aPTT 44 (H) 24 - 36 seconds    Comment:        IF BASELINE aPTT IS ELEVATED, SUGGEST PATIENT RISK ASSESSMENT BE USED TO DETERMINE APPROPRIATE ANTICOAGULANT THERAPY.   Fibrinogen      Status: Abnormal   Collection Time: 12/02/15 11:23 AM  Result Value Ref Range   Fibrinogen <60 (LL) 210 - 475 mg/dL    Comment: CRITICAL RESULT CALLED TO, READ BACK BY AND VERIFIED WITH: NIKKI KADYO ON 12/02/15 AT 1428 QSD   Fibrin derivatives D-Dimer ([place] only)     Status: Abnormal   Collection Time: 12/02/15 11:23 AM  Result Value Ref Range   Fibrin derivatives D-dimer ([facility]) 7,172 (H) 0 - 499    Comment: <> Exclusion of Venous Thromboembolism (VTE) - OUTPATIENTS ONLY        (  Emergency Department or [place])             0-499 ng/ml (FEU)  : With a low to intermediate pretest                                        probability for VTE this test result                                        excludes the diagnosis of VTE.           > 499 ng/ml (FEU)  : VTE not excluded.  Additional work up                                   for VTE is required.   <>  Testing on Inpatients and Evaluation of Disseminated Intravascular        Coagulation (DIC)             Reference Range:   0-499 ng/ml (FEU)   CBC with Differential     Status: Abnormal   Collection Time: 12/03/15  7:10 AM  Result Value Ref Range   WBC 15.8 (H) 3.8 - 10.6 K/uL   RBC 3.55 (L) 4.40 - 5.90 MIL/uL   Hemoglobin 10.2 (L) 13.0 - 18.0 g/dL   HCT 31.3 (L) 40.0 - 52.0 %   MCV 88.2 80.0 - 100.0 fL   MCH 28.6 26.0 - 34.0 pg   MCHC 32.4 32.0 - 36.0 g/dL   RDW 17.1 (H) 11.5 - 14.5 %   Platelets 77 (L) 150 - 440 K/uL   Neutrophils Relative % 83 %   Lymphocytes Relative 6 %   Monocytes Relative 6 %   Eosinophils Relative 0 %   Basophils Relative 0 %   Band Neutrophils 1 %   Metamyelocytes Relative 3 %   Myelocytes 1 %   Promyelocytes Absolute 0 %   Blasts 0 %   nRBC 0 0 /100 WBC   Other 0 %   Neutro Abs 14.0 (H) 1.4 - 6.5 K/uL   Lymphs Abs 0.9 (L) 1.0 - 3.6 K/uL   Monocytes Absolute 0.9 0.2 - 1.0 K/uL   Eosinophils Absolute 0.0 0 - 0.7 K/uL   Basophils Absolute 0.0 0 - 0.1 K/uL   RBC Morphology MIXED RBC POPULATION    Fibrinogen     Status: Abnormal   Collection Time: 12/03/15  7:10 AM  Result Value Ref Range   Fibrinogen 60 (LL) 210 - 475 mg/dL    Comment: RESULT REPEATED AND VERIFIED CRITICAL RESULT CALLED TO, READ BACK BY AND VERIFIED WITH: Lurlean Horns @ 0569 12/03/15 by [facility]   Fibrin derivatives D-Dimer ([place] only)     Status: Abnormal   Collection Time: 12/03/15  7:10 AM  Result Value Ref Range   Fibrin derivatives D-dimer ([facility]) 6,679 (H) 0 - 499    Comment: <> Exclusion of Venous Thromboembolism (VTE) - OUTPATIENTS ONLY        (Emergency Department or [place])             0-499 ng/ml (FEU)  : With a low to intermediate pretest  probability for VTE this test result                                        excludes the diagnosis of VTE.           > 499 ng/ml (FEU)  : VTE not excluded.  Additional work up                                   for VTE is required.   <>  Testing on Inpatients and Evaluation of Disseminated Intravascular        Coagulation (DIC)             Reference Range:   0-499 ng/ml (FEU)   Protime-INR     Status: Abnormal   Collection Time: 12/03/15  7:10 AM  Result Value Ref Range   Prothrombin Time 23.7 (H) 11.4 - 15.2 seconds   INR 6.43   Basic metabolic panel     Status: Abnormal   Collection Time: 12/03/15  7:10 AM  Result Value Ref Range   Sodium 137 135 - 145 mmol/L   Potassium 4.2 3.5 - 5.1 mmol/L   Chloride 95 (L) 101 - 111 mmol/L   CO2 31 22 - 32 mmol/L   Glucose, Bld 120 (H) 65 - 99 mg/dL   BUN 34 (H) 6 - 20 mg/dL   Creatinine, Ser 1.20 0.61 - 1.24 mg/dL   Calcium 9.2 8.9 - 10.3 mg/dL   GFR calc non Af Amer >60 >60 mL/min   GFR calc Af Amer >60 >60 mL/min    Comment: (NOTE) The eGFR has been calculated using the CKD EPI equation. This calculation has not been validated in all clinical situations. eGFR's persistently <60 mL/min signify possible Chronic Kidney Disease.    Anion gap 11 5 - 15  CK     Status:  Abnormal   Collection Time: 12/03/15  7:10 AM  Result Value Ref Range   Total CK 24 (L) 49 - 397 U/L   No results found.  Assessment:  Darryl Hatfield is a 55 y.o. 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    Chest CT angiogram on 11/27/2015 revealed no evidence of a pulmonary embolus and no acute findings in the chest.  There is no evidence of lymphangitic spread of tumor.  When reviewing current films and CT scans from earlier this summer (when patient was not on oxygen), he has more central prominence/fullness with narrowing of airways.  Coagulation studies reveal an elevated PT, PTT plus thombocytopenia with decreased fibrinogen and increased D-dimers consistent with DIC.  Plan: 1.  Oncology:  Metastatic lung cancer.  Patient considering Hospice.  Patient has extremely limited reserve.  Bronchoscopy unnable to be performed on 12/01/2015 secondary to thrombocytopenia.  Images reveal progressive lung cancer disease outside of chest.  Central progression in chest likely secondary to squamous cell lung cancer.  Patient also has metastatic adenocarcinoma.    Patient concerned about going home and difficulty breathing.  Discussed possible Hospice Home or going home with Hospice.  He is concerned about risk of bleeding with ongoing DIC.    2.  Hematology:  Mild progressive thrombocytopenia (platelet count 77,000) with  fibrinogen 60 (low), elevated PT, PTT, and increased D-dimers (7172 yesterday and 6679 today) consistent with disseminated intravascular coagulation (DIC).  Etiology likely secondary to malignancy (chronic DIC).  While hospitalized, discussed transfusion of cryoprecipitate (5-10 units) if bleeding.  Broad spectrum antibiotics restarted in case etiology is secondary to infection.  Prognosis poor.  3.  Pulmonary:  Bronchoscopy postponed on  12/01/2015 secondary to thrombocytopenia.  Patient is not a candidate for aggressive interventions/procedures given DIC.  4.  Infectious disease:  Patient started on vancomycin and piperacillin-tazobactam yesterday secondary to DIC.  No positive cultures to date.  No fever.  WBC 15,800.  5.  Code status:  DNR.   Melissa C Corcoran, MD  12/03/2015, 1:47 PM  

## 2015-12-04 ENCOUNTER — Encounter: Payer: Self-pay | Admitting: Internal Medicine

## 2015-12-04 DIAGNOSIS — Z7189 Other specified counseling: Secondary | ICD-10-CM

## 2015-12-04 DIAGNOSIS — F411 Generalized anxiety disorder: Secondary | ICD-10-CM

## 2015-12-04 DIAGNOSIS — Z515 Encounter for palliative care: Secondary | ICD-10-CM

## 2015-12-04 DIAGNOSIS — F418 Other specified anxiety disorders: Secondary | ICD-10-CM

## 2015-12-04 LAB — BASIC METABOLIC PANEL
Anion gap: 10 (ref 5–15)
BUN: 33 mg/dL — ABNORMAL HIGH (ref 6–20)
CHLORIDE: 96 mmol/L — AB (ref 101–111)
CO2: 33 mmol/L — AB (ref 22–32)
CREATININE: 1.18 mg/dL (ref 0.61–1.24)
Calcium: 9.3 mg/dL (ref 8.9–10.3)
GFR calc non Af Amer: >60 mL/min (ref >60)
Glucose, Bld: 121 mg/dL — ABNORMAL HIGH (ref 65–99)
POTASSIUM: 4.7 mmol/L (ref 3.5–5.1)
SODIUM: 139 mmol/L (ref 135–145)

## 2015-12-04 LAB — FIBRIN DERIVATIVES D-DIMER (ARMC ONLY): Fibrin derivatives D-dimer (ARMC): 7188 — ABNORMAL HIGH (ref 0–499)

## 2015-12-04 LAB — CBC
HEMATOCRIT: 30.8 % — AB (ref 40.0–52.0)
HEMOGLOBIN: 9.8 g/dL — AB (ref 13.0–18.0)
MCH: 28.4 pg (ref 26.0–34.0)
MCHC: 32 g/dL (ref 32.0–36.0)
MCV: 88.9 fL (ref 80.0–100.0)
Platelets: 75 10*3/uL — ABNORMAL LOW (ref 150–440)
RBC: 3.46 MIL/uL — AB (ref 4.40–5.90)
RDW: 16.3 % — ABNORMAL HIGH (ref 11.5–14.5)
WBC: 19.6 10*3/uL — AB (ref 3.8–10.6)

## 2015-12-04 LAB — FIBRINOGEN: Fibrinogen: <60 mg/dL — CL (ref 210–475)

## 2015-12-04 MED ORDER — FENTANYL 50 MCG/HR TD PT72: 50.0000 ug | MEDICATED_PATCH | TRANSDERMAL | Status: DC

## 2015-12-04 MED ORDER — SODIUM CHLORIDE 0.9% FLUSH: 10.0000 mL | INTRAVENOUS | Status: DC | PRN

## 2015-12-04 MED ORDER — LORAZEPAM 0.5 MG PO TABS
0.5000 mg | ORAL_TABLET | ORAL | Status: DC | PRN
  Administered 2015-12-04 – 2015-12-05 (×3): 0.5 mg via ORAL
  Filled 2015-12-04 (×3): qty 1

## 2015-12-04 NOTE — Care Management (Signed)
There has been discussion regarding hospice at home vs hospice home.  It is verbally reported to CM that patient is declining further chemo.  His platelets continue to decline.  Craige Cotta nurse liaison for [facility] / Hospice continues to follow patient closely.  Expecting input from palliative care today in regards hospice home criteria

## 2015-12-04 NOTE — Care Management (Signed)
Patient confirms that if it is determined that he meets criteria for a residential hospice facility, his preference is [facility].

## 2015-12-04 NOTE — Progress Notes (Addendum)
Daily Progress Note   Patient Name: Darryl Hatfield       Date: 12/04/2015 DOB: [birth year]  Age: 55 y.o. MRN#: [id number] Attending Physician: Loletha Grayer, MD Primary Care Physician: Marinda Elk, MD Admit Date: 11/27/2015  Reason for Consultation/Follow-up: Pain control and Psychosocial/spiritual support  Subjective:  Met with patient at bedside. He acknowledges he is at end of life. States he wants to be comfortable and out of pain. Notes pain in chest. Is slightly relieved with prn medication, but not to a level that he would like. Requiring frequent PRN medication. Also having anxiety re: end of life. He does not want to be at home and cause a burden to his wife. He is interested in residential hospice and would like to proceed with inquiring about placement.    Length of Stay: 7  Current Medications: Scheduled Meds:  . acetylcysteine  2 mL Nebulization BID  . aspirin EC  81 mg Oral Daily  . calcium-vitamin D  1 tablet Oral BID  . docusate sodium  100 mg Oral BID  . DULoxetine  60 mg Oral Daily  . [START ON 12/06/2015] fentaNYL  50 mcg Transdermal Q72H  . folic acid  1 mg Oral Daily  . gabapentin  100 mg Oral TID  . guaiFENesin  600 mg Oral BID  . Influenza vac split quadrivalent PF  0.5 mL Intramuscular Tomorrow-1000  . levalbuterol  1.25 mg Nebulization TID  . levothyroxine  44 mcg Oral QAC breakfast  . LORazepam  0.5 mg Oral QHS  . methylPREDNISolone (SOLU-MEDROL) injection  40 mg Intravenous Q6H  . metoprolol succinate  25 mg Oral Daily  . mometasone-formoterol  2 puff Inhalation BID  . oxyCODONE  10 mg Oral Q6H  . pantoprazole  40 mg Oral Daily  . piperacillin-tazobactam (ZOSYN)  IV  3.375 g Intravenous Q8H  . polyethylene glycol  17 g Oral Daily  .  QUEtiapine  50 mg Oral Daily  . senna  2 tablet Oral Daily  . sodium chloride flush  10-40 mL Intracatheter Q12H  . sodium chloride flush  3 mL Intravenous Q12H  . sodium chloride flush  3 mL Intravenous Q12H  . tiotropium  18 mcg Inhalation Daily  . vancomycin  750 mg Intravenous Q12H    Continuous Infusions:   PRN Meds:  sodium chloride, acetaminophen **OR** acetaminophen, hydrOXYzine, lactulose, LORazepam, metoCLOPramide, morphine injection, morphine CONCENTRATE, nitroGLYCERIN, ondansetron, sodium chloride flush, sodium chloride flush  Physical Exam  Constitutional: He is oriented to person, place, and time.  cathectic   Cardiovascular: Normal rate and regular rhythm.   Pulmonary/Chest: He has wheezes. He has rales.  Labored with any effort  Neurological: He is alert and oriented to person, place, and time.  Skin: Skin is warm and dry.  Psychiatric:  Anxious, emotional           Vital Signs: BP 109/70 (BP Location: Right Arm)   Pulse (!) 109   Temp 98.3 F (36.8 C) (Oral)   Resp 20   Ht 5' 8"  (1.727 m)   Wt 60.7 kg (133 lb 14.4 oz)   SpO2 90%   BMI 20.36 kg/m  SpO2: SpO2: 90 % O2 Device: O2 Device: Nasal Cannula O2 Flow Rate: O2 Flow Rate (L/min): 4 L/min  Intake/output summary:   Intake/Output Summary (Last 24 hours) at 12/04/15 1108 Last data filed at 12/04/15 0701  Gross per 24 hour  Intake              600 ml  Output              725 ml  Net             -125 ml   LBM: Last BM Date: 12/03/15 Baseline Weight: Weight: 62.1 kg (137 lb) Most recent weight: Weight: 60.7 kg (133 lb 14.4 oz)       Palliative Assessment/Data: PPS: 30%    Flowsheet Rows   Flowsheet Row Most Recent Value  Intake Tab  Referral Department  Hospitalist  Unit at Time of Referral  Cardiac/Telemetry Unit  Palliative Care Primary Diagnosis  Cancer  Date Notified  11/28/15  Palliative Care Type  New Palliative care  Reason for referral  Clarify Goals of Care  Date of Admission   11/27/15  Date first seen by Palliative Care  11/28/15  # of days Palliative referral response time  0 Day(s)  # of days IP prior to Palliative referral  1  Clinical Assessment  Palliative Performance Scale Score  50%  Psychosocial & Spiritual Assessment  Palliative Care Outcomes  Patient/Family meeting held?  Yes  Who was at the meeting?  patient  Palliative Care Outcomes  Changed CPR status      Patient Active Problem List   Diagnosis Date Noted  . DNR (do not resuscitate)   . Primary malignant neoplasm of lung metastatic to other site [facility])   . Hemoptysis   . Encounter for hospice care discussion   . SOB (shortness of breath) 11/27/2015  . Acute respiratory failure with hypoxemia ([place]) 11/22/2015  . Bone metastasis ([place]) 11/17/2015  . Symptomatic anemia 11/04/2015  . Other chest pain 11/04/2015  . Tachycardia 11/04/2015  . Pulmonary embolism without acute cor pulmonale ([place]) 10/07/2015  . Acute deep vein thrombosis (DVT) of femoral vein of both lower extremities ([place]) 10/07/2015  . Colitis 10/07/2015  . Dehydration 09/28/2015  . Epigastric discomfort 09/16/2015  . STEMI (ST elevation myocardial infarction) ([place]) 09/16/2015  . Pruritus 07/27/2015  . Constipation 06/05/2015  . Anxiety 05/12/2015  . Weight loss 05/12/2015  . 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 Adenocarcinoma of lung, stage 4 ([place]) 04/25/2015  . Iron deficiency anemia 04/05/2015  . Episodes of formed visual hallucinations 12/26/2014  . Dementia with behavioral disturbance 12/26/2014  . Delirium 12/25/2014  . Cancer  of upper lobe of right lung ([place]) 07/02/2013  . SCIATICA 03/01/2010    Palliative Care Assessment & Plan   Patient Profile: 55 y.o. male  with past medical history of lung cancer with metastatic spread, COPD, CHF, seizures, and hypothyroidism who was admitted on 11/27/2015 with SOB, epistaxis and hemoptysis.  Mr. Leamer has had multiple hospitalizations recently 9/22 - 10/4 and  [facility], 10/14 - 10/15 and 11/1 - 11/5 here at [facility].  He asks, "am I ever going to live outside of the hospital?".  He had a PE and is on Lovenox which could lend itself to bleeding. His baseline Hgb appears to be 11-12.  He was treated for symptomatic anemia on 10/14 when he received 2 units of blood. Hgb was 14.00 on 10/15 and has steadily trended down to 9.8 (MCV is normocytic). CXR on admission does not show PNA or acute changes. He was discharged on 11/5 on home oxygen, 2L.  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  Per oncology notes he is not candidate for aggressive therapy. He is now in DIC with fibrinogen <60. States he was told by oncologist that he may not leave the hospital.   Assessment/Recommendations/Plan:  Continuing pain in chest. Requiring frequent prn. Will increase fentanyl to 59mg/hr. Continue morphine concentrate for breakthrough pain and shortness of breath relief.   Lorazepam  .58mpo increased to q4hrs prn for anxiety  Consult to social work for residential hospice placement  He is at risk for sudden death due to fibrinogen today <60 and he has DIC.   Goals of Care and Additional Recommendations:  Limitations on Scope of Treatment: Avoid Hospitalization and Minimize Medications  Code Status:  DNR  Prognosis:   < 2 weeks d/t declining respiratory status, increasing pain, decreased po intake, advancing cancer with no futher interventions planned, DIC with risk for sudden death due to bleed  Discharge Planning:  Hospice facility  Care plan was discussed with the patient.  Thank you for allowing the Palliative Medicine Team to assist in the care of this patient.   Time In: 1015 Time Out: 1100 Total Time 45 min Prolonged Time Billed no      Greater than 50%  of this time was spent counseling and coordinating care related to the above assessment and plan.  KaMariana KaufmanAGNP-C Palliative Medicine    Please contact Palliative MedicineTeam phone at [phone] questions and concerns.   Please see AMION for individual provider pager numbers.

## 2015-12-04 NOTE — Progress Notes (Signed)
Chaplains did a joint visit. Patient shared with chaplains how his health has been declining and how he would most likely go to hospice. Chaplain prayed with the patient.

## 2015-12-04 NOTE — Progress Notes (Signed)
Physical Therapy Treatment Patient Details Name: Darryl Hatfield MRN: [id number] DOB: Feb 11, 1960 Today's Date: 12/04/2015    History of Present Illness 55 y/o male here complaints of chest pain and hypoxia.  He has lung cancer with metastacies, had b/l PEs 8/31 is s/p IVC filter and generally has been needing increased care and hospitalization recently. Pt now with B adrenal masses that are consistent with metastatic. Recent admission from 11/1-11/5 for similar symptoms.  Pt with increased frequency of nose bleeds. Was supposed to received HHPT after last discharge. Pt with trending down platelets and other blood levels at this time.     PT Comments    Pt is not making progress towards goals and has decline in functional status. Pt unable to perform OOB mobility this date secondary to SOB symptoms. With minimal supine there-ex pt with decreased O2 sats to 83% taking increased time and pursed lip breathing for sats to improve to 89%. Pt declines OOB mobility. Unable to tolerate therapy at this time. Not appropriate for SNF placement.  Follow Up Recommendations  Home health PT     Equipment Recommendations  None recommended by PT    Recommendations for Other Services       Precautions / Restrictions Precautions Precautions: Fall Restrictions Weight Bearing Restrictions: No    Mobility  Bed Mobility               General bed mobility comments: refused at this time  Transfers                    Ambulation/Gait                 Stairs            Wheelchair Mobility    Modified Rankin (Stroke Patients Only)       Balance                                    Cognition Arousal/Alertness: Awake/alert Behavior During Therapy: WFL for tasks assessed/performed Overall Cognitive Status: Within Functional Limits for tasks assessed                      Exercises Other Exercises Other Exercises: Pt performed supine ther-ex  including B LE quad sets, SAQ, and hip abd/add. Pt on 4L of O2 with sats decreasing to 83% with minimal ther-ex. Pt with increased SOB symptoms with exertion. Takes increased time for symptoms to improve    General Comments        Pertinent Vitals/Pain Pain Assessment: Faces Faces Pain Scale: Hurts little more Pain Location: "everywhere" Pain Intervention(s): Limited activity within patient's tolerance    Home Living                      Prior Function            PT Goals (current goals can now be found in the care plan section) Acute Rehab PT Goals Patient Stated Goal: go home PT Goal Formulation: With patient Time For Goal Achievement: 12/14/15 Potential to Achieve Goals: Good Progress towards PT goals: Progressing toward goals    Frequency    Min 2X/week      PT Plan Current plan remains appropriate    Co-evaluation             End of Session Equipment Utilized During Treatment: Oxygen Activity Tolerance:  Patient limited by fatigue Patient left: in bed;with bed alarm set     Time: 4010-2725 PT Time Calculation (min) (ACUTE ONLY): 15 min  Charges:  $Therapeutic Exercise: 8-22 mins                    G Codes:      Otillia Cordone Jan 03, 2016, 11:58 AM Greggory Stallion, PT, DPT [phone]

## 2015-12-04 NOTE — Progress Notes (Signed)
Patient ID: Darryl Hatfield, male   DOB: [birth year], 55 y.o.   MRN: [id number]  [employer] PROGRESS NOTE  Darryl Hatfield YSA:[id number] DOB: 04/18/1960 DOA: 11/27/2015 PCP: Marinda Elk, MD  HPI/Subjective: Patient is feeling very weak and short of breath. Still does not feel well at all. Very limited in what he is able to do. Unable to do any work today with physical therapy.  Objective: Vitals:   12/04/15 1137 12/04/15 1137  BP: 135/78   Pulse: (!) 105 (!) 107  Resp: 18   Temp: 98.2 F (36.8 C)     Filed Weights   11/27/15 1245 11/27/15 1753 11/28/15 0406  Weight: 62.1 kg (137 lb) 61.7 kg (136 lb) 60.7 kg (133 lb 14.4 oz)    ROS: Review of Systems  Constitutional: Negative for chills and fever.  Eyes: Negative for blurred vision.  Respiratory: Positive for cough and shortness of breath. Negative for sputum production.   Cardiovascular: Negative for chest pain.  Gastrointestinal: Positive for constipation and nausea. Negative for abdominal pain, diarrhea and vomiting.  Genitourinary: Negative for dysuria.  Musculoskeletal: Negative for joint pain.  Neurological: Negative for dizziness and headaches.   Exam: Physical Exam  Constitutional: He is oriented to person, place, and time.  HENT:  Nose: No mucosal edema.  Mouth/Throat: No oropharyngeal exudate or posterior oropharyngeal edema.  Eyes: Conjunctivae, EOM and lids are normal. Pupils are equal, round, and reactive to light.  Neck: No JVD present. Carotid bruit is not present. No edema present. No thyroid mass and no thyromegaly present.  Cardiovascular: S1 normal and S2 normal.  Exam reveals no gallop.   No murmur heard. Pulses:      Dorsalis pedis pulses are 2+ on the right side, and 2+ on the left side.  Respiratory: No respiratory distress. He has decreased breath sounds in the right middle field, the right lower field, the left middle field and the left lower field. He has no wheezes. He has rhonchi  in the right lower field, the left upper field and the left lower field. He has no rales.  GI: Soft. Bowel sounds are normal. There is no tenderness.  Musculoskeletal:       Right ankle: He exhibits no swelling.       Left ankle: He exhibits no swelling.  Lymphadenopathy:    He has no cervical adenopathy.  Neurological: He is alert and oriented to person, place, and time. No cranial nerve deficit.  Skin: Skin is warm. No rash noted. Nails show no clubbing.  Psychiatric: He has a normal mood and affect.      Data Reviewed: Basic Metabolic Panel:  Recent Labs Lab 11/28/15 0342 12/01/15 0956 12/03/15 0710 12/04/15 0448  NA 139 138 137 139  K 4.6 4.6 4.2 4.7  CL 97* 95* 95* 96*  CO2 33* 33* 31 33*  GLUCOSE 144* 97 120* 121*  BUN 20 34* 34* 33*  CREATININE 1.17 1.05 1.20 1.18  CALCIUM 9.3 10.0 9.2 9.3   Liver Function Tests:  Recent Labs Lab 12/01/15 0956  AST 38  ALT 19  ALKPHOS 162*  BILITOT 0.6  PROT 6.0*  ALBUMIN 3.1*   CBC:  Recent Labs Lab 11/28/15 0342 12/01/15 0956 12/02/15 0430 12/03/15 0710 12/04/15 0448  WBC 5.0 13.7* 11.2* 15.8* 19.6*  NEUTROABS  --   --  9.5* 14.0*  --   HGB 9.8* 10.3* 10.4* 10.2* 9.8*  HCT 30.4* 31.2* 31.5* 31.3* 30.8*  MCV 89.8 88.0 89.3  88.2 88.9  PLT 92* 89* 80* 77* 75*   Cardiac Enzymes:  Recent Labs Lab 12/03/15 0710  CKTOTAL 24*   BNP (last 3 results)  Recent Labs  09/16/15 1917  BNP 97.0      Scheduled Meds: . acetylcysteine  2 mL Nebulization BID  . aspirin EC  81 mg Oral Daily  . calcium-vitamin D  1 tablet Oral BID  . docusate sodium  100 mg Oral BID  . DULoxetine  60 mg Oral Daily  . [START ON 12/06/2015] fentaNYL  50 mcg Transdermal Q72H  . folic acid  1 mg Oral Daily  . gabapentin  100 mg Oral TID  . guaiFENesin  600 mg Oral BID  . Influenza vac split quadrivalent PF  0.5 mL Intramuscular Tomorrow-1000  . levalbuterol  1.25 mg Nebulization TID  . levothyroxine  44 mcg Oral QAC breakfast  .  LORazepam  0.5 mg Oral QHS  . methylPREDNISolone (SOLU-MEDROL) injection  40 mg Intravenous Q6H  . metoprolol succinate  25 mg Oral Daily  . mometasone-formoterol  2 puff Inhalation BID  . oxyCODONE  10 mg Oral Q6H  . pantoprazole  40 mg Oral Daily  . piperacillin-tazobactam (ZOSYN)  IV  3.375 g Intravenous Q8H  . polyethylene glycol  17 g Oral Daily  . QUEtiapine  50 mg Oral Daily  . senna  2 tablet Oral Daily  . sodium chloride flush  10-40 mL Intracatheter Q12H  . sodium chloride flush  3 mL Intravenous Q12H  . sodium chloride flush  3 mL Intravenous Q12H  . tiotropium  18 mcg Inhalation Daily  . vancomycin  750 mg Intravenous Q12H    Assessment/Plan:  1. Acute on chronic hypoxic respiratory failure with worsening respiratory status. Continue oxygen supplementation. Patient did not like Roxanol for symptom management. Try ativan for symptom management.Patient will likely end up going to the hospice home. Patient is a DO NOT RESUSCITATE 2. DIC unclear etiology. Started zosyn and vancomycin. Supportive care. Overall prognosis poor secondary to DIC likely secondary to cancer. 3. Metastatic lung cancer.  Overall prognosis is poor. Patient already a DO NOT RESUSCITATE. Patient will be a candidate for hospice home. 4. COPD exacerbation. IV Solu-Medrol and nebulizer treatments.   5. Hemoptysis. Resolved 6. Constipation. Had bowel movement 2 days ago 7. Hyperlipidemia unspecified. Stop statin 8. Hypothyroidism unspecified. Continue levothyroxine.  Code Status:     Code Status Orders        Start     Ordered   11/29/15 0904  Do not attempt resuscitation (DNR)  Continuous    Question Answer Comment  In the event of cardiac or respiratory ARREST Do not call a "code blue"   In the event of cardiac or respiratory ARREST Do not perform Intubation, CPR, defibrillation or ACLS   In the event of cardiac or respiratory ARREST Use medication by any route, position, wound care, and other  measures to relive pain and suffering. May use oxygen, suction and manual treatment of airway obstruction as needed for comfort.      11/29/15 6644    Code Status History    Date Active Date Inactive Code Status Order ID Comments User Context   11/27/2015  6:05 PM 11/29/2015  9:03 AM Full Code [id number]  Dustin Flock, MD Inpatient   11/22/2015  4:47 PM 11/26/2015  8:30 PM Full Code [id number]  Epifanio Lesches, MD ED   11/04/2015 11:40 AM 11/06/2015  3:15 AM Full Code [id number]  Idelle Crouch,  MD Inpatient   12/25/2014  4:39 AM 12/26/2014  6:04 PM Full Code [id number]  Harrie Foreman, MD Inpatient    Advance Directive Documentation   Flowsheet Row Most Recent Value  Type of Advance Directive  Living will, Healthcare Power of Attorney  Pre-existing out of facility DNR order (yellow form or pink MOST form)  No data  "MOST" Form in Place?  No data     Family Communication: wife on the phone Disposition Plan:  Potentially the hospice home when bed available  Consultants:  Oncology  Pulmonary  Time spent: total time today 25 minutes.  Loletha Grayer  [employer]

## 2015-12-04 NOTE — Clinical Social Work Note (Signed)
CSW has received consult now for hospice residential treatment facility. CSW spoke with RN CM and was asked to hold off on speaking with patient regarding preference for hospice facility as Santiago Glad with Hospice informed RN CM that they need to speak with patient's wife first.  Shela Leff MSW,LCSW [phone]

## 2015-12-05 DIAGNOSIS — Z7189 Other specified counseling: Secondary | ICD-10-CM

## 2015-12-05 DIAGNOSIS — Z515 Encounter for palliative care: Secondary | ICD-10-CM

## 2015-12-05 LAB — SEROTONIN RELEASE ASSAY (SRA)
SRA .2 IU/mL UFH Ser-aCnc: 2 % (ref 0–20)
SRA 100IU/mL UFH Ser-aCnc: 1 % (ref 0–20)

## 2015-12-05 LAB — VANCOMYCIN, TROUGH: VANCOMYCIN TR: 19 ug/mL (ref 15–20)

## 2015-12-05 MED ORDER — MORPHINE SULFATE (CONCENTRATE) 10 MG/0.5ML PO SOLN: 20.0000 mg | ORAL | Status: DC | PRN

## 2015-12-05 MED ORDER — LORAZEPAM 1 MG PO TABS: 0.5000 mg | ORAL_TABLET | ORAL | 0 refills | Status: AC | PRN

## 2015-12-05 MED ORDER — FENTANYL 50 MCG/HR TD PT72: 50.0000 ug | MEDICATED_PATCH | TRANSDERMAL | 0 refills | Status: AC

## 2015-12-05 MED ORDER — MORPHINE SULFATE (CONCENTRATE) 10 MG/0.5ML PO SOLN: 20.0000 mg | ORAL | 0 refills | Status: AC | PRN

## 2015-12-05 NOTE — Care Management (Signed)
Spoke with palliative care and recommendation is residential hospice but said the liaison will have to make the determination that patient meets criteria.  Updated CSW

## 2015-12-05 NOTE — Progress Notes (Signed)
Patient being discharged to [facility].  Details of discharge have been set up by hospice coordinator.  Peripheral IV dc's.  Port remains accessed.

## 2015-12-05 NOTE — Consult Note (Signed)
Pharmacy Antibiotic Note  Darryl Hatfield is a 55 y.o. male admitted on 11/27/2015 with metastatic lung cancer with thrombocytopenia concerning for DIC.  Pharmacy has been consulted for vancomycin and zosyn dosing per recommendations of oncology.  Plan: Vancomycin 1g once then give next dose in 6 hours for stacked dosing. Vancomycin '750mg'$  IV every 12 hours.  Goal trough 15-20 mcg/mL. Zosyn 3.375g IV q8h (4 hour infusion).  Trough prior to the 5th dose 11/13 @ 0600  Height: '5\' 8"'$  (172.7 cm) Weight: 133 lb 14.4 oz (60.7 kg) IBW/kg (Calculated) : 68.4  Temp (24hrs), Avg:98.1 F (36.7 C), Min:97.8 F (36.6 C), Max:98.3 F (36.8 C)   Recent Labs Lab 12/01/15 0956 12/02/15 0430 12/03/15 0710 12/04/15 0448 12/05/15 0542  WBC 13.7* 11.2* 15.8* 19.6*  --   CREATININE 1.05  --  1.20 1.18  --   VANCOTROUGH  --   --   --   --  19    Estimated Creatinine Clearance: 60 mL/min (by C-G formula based on SCr of 1.18 mg/dL).    Allergies  Allergen Reactions  . No Known Allergies     Antimicrobials this admission: levofloxacin 11/6 >> 11/10 vancomycin 11/11 >>  Zosyn 11/11>>  Dose adjustments this admission: 11/14 AM vanc level 19. Continue current regimen. SCr ordered for tomorrow AM.   Microbiology results: 11/1 BCx: No growth   Thank you for allowing pharmacy to be a part of this patient's care.  Taela Charbonneau S 12/05/2015 6:13 AM

## 2015-12-05 NOTE — Progress Notes (Signed)
Daily Progress Note   Patient Name: Darryl Hatfield       Date: 12/05/2015 DOB: Mar 21, 1960  Age: 55 y.o. MRN#: [id number] Attending Physician: Darryl Grayer, MD Primary Care Physician: Darryl Elk, MD Admit Date: 11/27/2015  Reason for Consultation/Follow-up: Establishing goals of care, Non pain symptom management, Pain control and Psychosocial/spiritual support  Subjective:  Met with patient at bedside. He feels "weak and bad". He continues to desire comfort measures. He desaturated into low 80's while at rest in the bed. Lorazepam and morphine were administered for comfort. [place] increased to 5L. Residential hospice placement pending.    Length of Stay: 8  Current Medications: Scheduled Meds:  . acetylcysteine  2 mL Nebulization BID  . aspirin EC  81 mg Oral Daily  . calcium-vitamin D  1 tablet Oral BID  . docusate sodium  100 mg Oral BID  . DULoxetine  60 mg Oral Daily  . [START ON 12/06/2015] fentaNYL  50 mcg Transdermal Q72H  . folic acid  1 mg Oral Daily  . gabapentin  100 mg Oral TID  . guaiFENesin  600 mg Oral BID  . Influenza vac split quadrivalent PF  0.5 mL Intramuscular Tomorrow-1000  . levalbuterol  1.25 mg Nebulization TID  . levothyroxine  44 mcg Oral QAC breakfast  . LORazepam  0.5 mg Oral QHS  . methylPREDNISolone (SOLU-MEDROL) injection  40 mg Intravenous Q6H  . metoprolol succinate  25 mg Oral Daily  . mometasone-formoterol  2 puff Inhalation BID  . oxyCODONE  10 mg Oral Q6H  . pantoprazole  40 mg Oral Daily  . piperacillin-tazobactam (ZOSYN)  IV  3.375 g Intravenous Q8H  . polyethylene glycol  17 g Oral Daily  . QUEtiapine  50 mg Oral Daily  . senna  2 tablet Oral Daily  . sodium chloride flush  10-40 mL Intracatheter Q12H  . sodium chloride flush   3 mL Intravenous Q12H  . sodium chloride flush  3 mL Intravenous Q12H  . tiotropium  18 mcg Inhalation Daily  . vancomycin  750 mg Intravenous Q12H    Continuous Infusions:   PRN Meds: sodium chloride, acetaminophen **OR** acetaminophen, hydrOXYzine, lactulose, LORazepam, metoCLOPramide, morphine injection, morphine CONCENTRATE, nitroGLYCERIN, ondansetron, sodium chloride flush, sodium chloride flush  Physical Exam  Constitutional: He is oriented to person, place, and  time.  cathectic   Cardiovascular: Normal rate and regular rhythm.   Pulmonary/Chest: He has wheezes. He has rales.  Labored at rest  Neurological: He is alert and oriented to person, place, and time.  Skin: Skin is warm and dry.  Psychiatric:  Anxious, emotional           Vital Signs: BP 136/80 (BP Location: Left Arm)   Pulse 99   Temp 97.6 F (36.4 C) (Oral)   Resp 20   Ht 5\' 8"  (1.727 m)   Wt 60.7 kg (133 lb 14.4 oz)   SpO2 (!) 89%   BMI 20.36 kg/m  SpO2: SpO2: (!) 89 % O2 Device: O2 Device: Nasal Cannula O2 Flow Rate: O2 Flow Rate (L/min): 5 L/min  Intake/output summary:   Intake/Output Summary (Last 24 hours) at 12/05/15 1014 Last data filed at 12/05/15 12/19/2015  Gross per 24 hour  Intake              460 ml  Output              725 ml  Net             -265 ml   LBM: Last BM Date: 12/04/15 Baseline Weight: Weight: 62.1 kg (137 lb) Most recent weight: Weight: 60.7 kg (133 lb 14.4 oz)       Palliative Assessment/Data: PPS: 20%    Flowsheet Rows   Flowsheet Row Most Recent Value  Intake Tab  Referral Department  Hospitalist  Unit at Time of Referral  Cardiac/Telemetry Unit  Palliative Care Primary Diagnosis  Cancer  Date Notified  11/28/15  Palliative Care Type  New Palliative care  Reason for referral  Clarify Goals of Care  Date of Admission  11/27/15  Date first seen by Palliative Care  11/28/15  # of days Palliative referral response time  0 Day(s)  # of days IP prior to Palliative  referral  1  Clinical Assessment  Palliative Performance Scale Score  50%  Psychosocial & Spiritual Assessment  Palliative Care Outcomes  Patient/Family meeting held?  Yes  Who was at the meeting?  patient  Palliative Care Outcomes  Changed CPR status      Patient Active Problem List   Diagnosis Date Noted  . Anxiety associated with dying process   . DNR (do not resuscitate)   . Primary malignant neoplasm of lung metastatic to other site [facility])   . Hemoptysis   . Encounter for hospice care discussion   . SOB (shortness of breath) 11/27/2015  . Acute respiratory failure with hypoxemia (HCC) 11/22/2015  . Bone metastasis (HCC) 11/17/2015  . Symptomatic anemia 11/04/2015  . Other chest pain 11/04/2015  . Tachycardia 11/04/2015  . Pulmonary embolism without acute cor pulmonale (HCC) 10/07/2015  . Acute deep vein thrombosis (DVT) of femoral vein of both lower extremities (HCC) 10/07/2015  . Colitis 10/07/2015  . Dehydration 09/28/2015  . Epigastric discomfort 09/16/2015  . STEMI (ST elevation myocardial infarction) (HCC) 09/16/2015  . Pruritus 07/27/2015  . Constipation 06/05/2015  . Anxiety 05/12/2015  . Weight loss 05/12/2015  . 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 Adenocarcinoma of lung, stage 4 (HCC) 04/25/2015  . Iron deficiency anemia 04/05/2015  . Episodes of formed visual hallucinations 12/26/2014  . Dementia with behavioral disturbance 12/26/2014  . Delirium 12/25/2014  . Cancer of upper lobe of right lung (HCC) 07/02/2013  . SCIATICA 03/01/2010    Palliative Care Assessment & Plan   Patient Profile: 55  y.o. male  with past medical history of lung cancer with metastatic spread, COPD, CHF, seizures, and hypothyroidism who was admitted on 11/27/2015 with SOB, epistaxis and hemoptysis.  Darryl Hatfield has had multiple hospitalizations recently 9/22 - 10/4 and [facility], 10/14 - 10/15 and 11/1 - 11/5 here at [facility].  He asks, "am I ever going to live outside of the  hospital?".  He had a PE and is on Lovenox which could lend itself to bleeding. His baseline Hgb appears to be 11-12.  He was treated for symptomatic anemia on 10/14 when he received 2 units of blood. Hgb was 14.00 on 10/15 and has steadily trended down to 9.8 (MCV is normocytic). CXR on admission does not show PNA or acute changes. He was discharged on 11/5 on home oxygen, 2L.  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  Per oncology notes he is not candidate for aggressive therapy. He is now in DIC with fibrinogen <60. States he was told by oncologist that he may not leave the hospital. His respiratory status continues to decline.    Assessment/Recommendations/Plan:  Continue comfort measures as ordered.   Administer prn opioids for SOB  He is at risk for sudden death due to fibrinogen today <60 and he has DIC.  Respiratory status is fragile- he is desaturating and my concern is that he is going to rapidly decline and require significant symptom management - residential hospice bed evaluation has been requested  Goals of Care and Additional Recommendations:  Limitations on Scope of Treatment: Avoid Hospitalization and Minimize Medications  Code Status:  DNR  Prognosis:   < 2 weeks d/t rapidly declining respiratory status, increasing pain, decreased po intake, advancing cancer with no futher interventions planned, DIC with risk for sudden death due to bleed  Discharge Planning:  Hospice facility pending evaluation and bed availability  Care plan was discussed with the patient.  Thank you for allowing the Palliative Medicine Team to assist in the care of this patient.   Time In: 0930 Time Out: 1015 Total Time 45 min Prolonged Time Billed no      Greater than 50%  of this time was spent counseling and coordinating care related to the above assessment and plan.  Mariana Kaufman,  AGNP-C Palliative Medicine    Please contact Palliative MedicineTeam phone at [phone] for questions and concerns.   Please see AMION for individual provider pager numbers.

## 2015-12-05 NOTE — Discharge Summary (Signed)
Darryl Hatfield at [facility] NAME: Darryl Hatfield    MR#:  [id number]  DATE OF BIRTH:  [birth year]  DATE OF ADMISSION:  11/27/2015 ADMITTING PHYSICIAN: Dustin Flock, MD  DATE OF DISCHARGE: 12/05/2015  PRIMARY CARE PHYSICIAN: Marinda Elk, MD    ADMISSION DIAGNOSIS:  Acute respiratory failure with hypoxia ([place]) [J96.01]  DISCHARGE DIAGNOSIS:  Active Problems:   SOB (shortness of breath)   Primary malignant neoplasm of lung metastatic to other site [facility])   Hemoptysis   Encounter for hospice care discussion   DNR (do not resuscitate)   Anxiety associated with dying process   SECONDARY DIAGNOSIS:   Past Medical History:  Diagnosis Date  . CHF (congestive heart failure) ([place])   . COPD (chronic obstructive pulmonary disease) ([place])   . Hypothyroidism   . Lung cancer ([place]) 2015   RIght   . Seizures ([place])     HOSPITAL COURSE:   1.  Acute on chronic respiratory failure with worsening respiratory status. Continue oxygen supplementation 5 L nasal cannula. Comfort care measures with Roxanol and Ativan. Patient will be set up to go to the hospice home today. Patient is a DO NOT RESUSCITATE. 2. DIC the likely cause is his metastatic lung cancer. I did prescribe aggressive antibiotics which did not help. 3. Metastatic lung cancer. Patient will go to the hospice home. 4. COPD exacerbation. Stop steroids and antibiotics at this point. Patient will continue nebulizer treatments and inhaler for comfort 5. Hemoptysis which has resolved 6. Constipation. Patient did have a bowel movement with lactulose a few days ago 7. Hyperlipidemia unspecified stop statin 8. Hypothyroidism unspecified stop levothyroxine  DISCHARGE CONDITIONS:   Guarded  CONSULTS OBTAINED:  Treatment Team:  Lequita Asal, MD Allyne Gee, MD  DRUG ALLERGIES:   Allergies  Allergen Reactions  . No Known Allergies     DISCHARGE MEDICATIONS:   Current Discharge  Medication List    START taking these medications   Details  Morphine Sulfate (MORPHINE CONCENTRATE) 10 MG/0.5ML SOLN concentrated solution Take 1 mL (20 mg total) by mouth every 2 (two) hours as needed for severe pain or shortness of breath. Qty: 30 mL, Refills: 0      CONTINUE these medications which have CHANGED   Details  fentaNYL (DURAGESIC - DOSED MCG/HR) 50 MCG/HR Place 1 patch (50 mcg total) onto the skin every 3 (three) days. Qty: 10 patch, Refills: 0    LORazepam (ATIVAN) 1 MG tablet Take 0.5 tablets (0.5 mg total) by mouth every 4 (four) hours as needed for anxiety. Qty: 40 tablet, Refills: 0      CONTINUE these medications which have NOT CHANGED   Details  hydrOXYzine (ATARAX/VISTARIL) 10 MG tablet Take 1 tablet (10 mg total) by mouth 3 (three) times daily as needed for itching. Qty: 60 tablet, Refills: 1    acetaminophen (TYLENOL) 325 MG tablet Take 650 mg by mouth every 6 (six) hours as needed for mild pain.     albuterol (PROVENTIL) (2.5 MG/3ML) 0.083% nebulizer solution Take 3 mLs (2.5 mg total) by nebulization every 6 (six) hours as needed for wheezing or shortness of breath. Qty: 75 mL, Refills: 2    mometasone-formoterol (DULERA) 100-5 MCG/ACT AERO Inhale 2 puffs into the lungs 2 (two) times daily. Qty: 0.1 g, Refills: 0    ondansetron (ZOFRAN) 8 MG tablet Take 1 tablet (8 mg total) by mouth every 8 (eight) hours as needed for nausea or vomiting. Qty: 30  tablet, Refills: 3   Associated Diagnoses: Adenocarcinoma of lung, stage 4, right (HCC)      STOP taking these medications     aspirin 81 MG EC tablet      Calcium Carbonate-Vitamin D 600-400 MG-UNIT tablet      DULoxetine (CYMBALTA) 30 MG capsule      fentaNYL (DURAGESIC - DOSED MCG/HR) 12 MCG/HR      folic acid (FOLVITE) 1 MG tablet      gabapentin (NEURONTIN) 100 MG capsule      levothyroxine (SYNTHROID, LEVOTHROID) 88 MCG tablet      metoCLOPramide (REGLAN) 10 MG tablet      metoprolol  succinate (TOPROL-XL) 25 MG 24 hr tablet      oxyCODONE (OXY IR/ROXICODONE) 5 MG immediate release tablet      QUEtiapine (SEROQUEL) 50 MG tablet      atorvastatin (LIPITOR) 80 MG tablet      nitroGLYCERIN (NITROSTAT) 0.4 MG SL tablet      pantoprazole (PROTONIX) 40 MG tablet      predniSONE (DELTASONE) 10 MG tablet          DISCHARGE INSTRUCTIONS:   Discharge to hospice home for comfort care measures  If you experience worsening of your admission symptoms, develop shortness of breath, life threatening emergency, suicidal or homicidal thoughts you must seek medical attention immediately by calling 911 or calling your MD immediately  if symptoms less severe.  You Must read complete instructions/literature along with all the possible adverse reactions/side effects for all the Medicines you take and that have been prescribed to you. Take any new Medicines after you have completely understood and accept all the possible adverse reactions/side effects.   Please note  You were cared for by a hospitalist during your hospital stay. If you have any questions about your discharge medications or the care you received while you were in the hospital after you are discharged, you can call the unit and asked to speak with the hospitalist on call if the hospitalist that took care of you is not available. Once you are discharged, your primary care physician will handle any further medical issues. Please note that NO REFILLS for any discharge medications will be authorized once you are discharged, as it is imperative that you return to your primary care physician (or establish a relationship with a primary care physician if you do not have one) for your aftercare needs so that they can reassess your need for medications and monitor your lab values.    Today   CHIEF COMPLAINT:   Chief Complaint  Patient presents with  . Chest Pain    HISTORY OF PRESENT ILLNESS:  Darryl Hatfield  is a 55 y.o.  male patient presented with chest pain and worsening respiratory status   VITAL SIGNS:  Blood pressure 136/80, pulse 99, temperature 97.6 F (36.4 C), temperature source Oral, resp. rate 20, height '5\' 8"'$  (1.727 m), weight 60.7 kg (133 lb 14.4 oz), SpO2 (!) 89 %.    PHYSICAL EXAMINATION:  GENERAL:  55 y.o.-year-old patient lying in the bed With complaints of shortness of breath EYES: Pupils equal, round, reactive to light and accommodation. No scleral icterus. Extraocular muscles intact.  HEENT: Head atraumatic, normocephalic. Oropharynx and nasopharynx clear.  NECK:  Supple, no jugular venous distention. No thyroid enlargement, no tenderness.  LUNGS: Decreased breath sounds bilaterally, positive rhonchi throughout entire lung field. No use of accessory muscles of respiration.  CARDIOVASCULAR: S1, S2 normal. No murmurs, rubs, or gallops.  ABDOMEN: Soft, non-tender, non-distended. Bowel sounds present. No organomegaly or mass.  EXTREMITIES: No pedal edema, cyanosis, or clubbing.  NEUROLOGIC: Cranial nerves II through XII are intact. Muscle strength 5/5 in all extremities. Sensation intact. Gait not checked.  PSYCHIATRIC: The patient is alert and oriented x 3.  SKIN: No obvious rash, lesion, or ulcer.   DATA REVIEW:   CBC  Recent Labs Lab 12/04/15 0448  WBC 19.6*  HGB 9.8*  HCT 30.8*  PLT 75*    Chemistries   Recent Labs Lab 12/01/15 0956  12/04/15 0448  NA 138  < > 139  K 4.6  < > 4.7  CL 95*  < > 96*  CO2 33*  < > 33*  GLUCOSE 97  < > 121*  BUN 34*  < > 33*  CREATININE 1.05  < > 1.18  CALCIUM 10.0  < > 9.3  AST 38  --   --   ALT 19  --   --   ALKPHOS 162*  --   --   BILITOT 0.6  --   --   < > = values in this interval not displayed.   Microbiology Results  Results for orders placed or performed during the hospital encounter of 11/22/15  Culture, blood (routine x 2)     Status: None   Collection Time: 11/22/15  3:35 PM  Result Value Ref Range Status    Specimen Description BLOOD  RIGHT FORE ARM  Final   Special Requests   Final    BOTTLES DRAWN AEROBIC AND ANAEROBIC  AER 13CC ANA 12CC   Culture NO GROWTH 5 DAYS  Final   Report Status 11/27/2015 FINAL  Final  Culture, blood (routine x 2)     Status: None   Collection Time: 11/22/15  3:35 PM  Result Value Ref Range Status   Specimen Description BLOOD  LEFT FORE ARM  Final   Special Requests   Final    BOTTLES DRAWN AEROBIC AND ANAEROBIC  AER [place] ANA [place]   Culture NO GROWTH 5 DAYS  Final   Report Status 11/27/2015 FINAL  Final   Management plans discussed with the patient, And he is in agreement. I left a message for the wife to call me back  CODE STATUS:     Code Status Orders        Start     Ordered   11/29/15 0904  Do not attempt resuscitation (DNR)  Continuous    Question Answer Comment  In the event of cardiac or respiratory ARREST Do not call a "code blue"   In the event of cardiac or respiratory ARREST Do not perform Intubation, CPR, defibrillation or ACLS   In the event of cardiac or respiratory ARREST Use medication by any route, position, wound care, and other measures to relive pain and suffering. May use oxygen, suction and manual treatment of airway obstruction as needed for comfort.      11/29/15 3151    Code Status History    Date Active Date Inactive Code Status Order ID Comments User Context   11/27/2015  6:05 PM 11/29/2015  9:03 AM Full Code [id number]  Dustin Flock, MD Inpatient   11/22/2015  4:47 PM 11/26/2015  8:30 PM Full Code [id number]  Epifanio Lesches, MD ED   11/04/2015 11:40 AM 11/06/2015  3:15 AM Full Code [id number]  Idelle Crouch, MD Inpatient   12/25/2014  4:39 AM 12/26/2014  6:04 PM Full Code [id number]  Harrie Foreman,  MD Inpatient    Advance Directive Documentation   Flowsheet Row Most Recent Value  Type of Advance Directive  Living will, Healthcare Power of Attorney  Pre-existing out of facility DNR order (yellow form or pink MOST form)   No data  "MOST" Form in Place?  No data      TOTAL TIME TAKING CARE OF THIS PATIENT: 35 minutes.    Loletha Grayer M.D on 12/05/2015 at 11:08 AM  Between 7am to 6pm - Pager - [phone]  After 6pm go to [email or web address] - password [email or web address]  [employer] Office  [phone]  CC: Primary care physician; Marinda Elk, MD

## 2015-12-05 NOTE — Progress Notes (Signed)
New hospice home referral received from [facility]. Mr. 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 He has continued to decline, with increased dyspnea, pain, hemoptysis and DIC. Palliative Medicine was consulted and have been following providing support and guidance for patient and his family. His fentanyl patch was increased to 50 mcg on 11/13, in addition he is receiving oxycodone 10 mg q 6 hours routine and has IV morphine 2 mg as well. He has required 2 doses of IV lorazepam 1 mg each this morning. Patient seen lying in bed, becomes dyspneic with any movement or conversation, alert and oriented. Patient's brother and nephew present visiting. Writer initiated education regarding hospice services, philosophy and team approach to care with good understanding voiced. Questions answered, consents signed. Patien's wife [name] was not present, he did speak with her over the phone, she is aware of the plan for discharge today to the hospice home today. Patient information faxed to hospice referral, report called to the hospice home EMS notified for transport. Signed portable DNR in place in discharge packet, hospital care team all aware of and in agreement with discharge plan. Thank you for the opportunity to be involved in the care of this patient. Flo Shanks RN, BSN, [place] and [facility] [place], [facility] [phone] c

## 2015-12-22 DEATH — deceased

## 2015-12-30 ENCOUNTER — Other Ambulatory Visit: Payer: Self-pay | Admitting: Nurse Practitioner

## 2018-07-06 IMAGING — MR MR HEAD W/O CM
10 series · 48 of 48 positions shown · non-contrast
Comparison: PET-CT September 06, 2015 and MRI of the head December 25, 2014.

CLINICAL DATA: RIGHT upper extremity paresthesias today, now
resolved. History of metastatic lung cancer, seizures.

EXAM:
MRI HEAD WITHOUT CONTRAST
TECHNIQUE: Multiplanar, multiecho pulse sequences of the brain and surrounding
structures were obtained without intravenous contrast.

[Series 2: T1 · sagittal · 5.0mm · 0.45mm/px · 4 of 30 slices shown (1 of 2)]
[im 1/30]
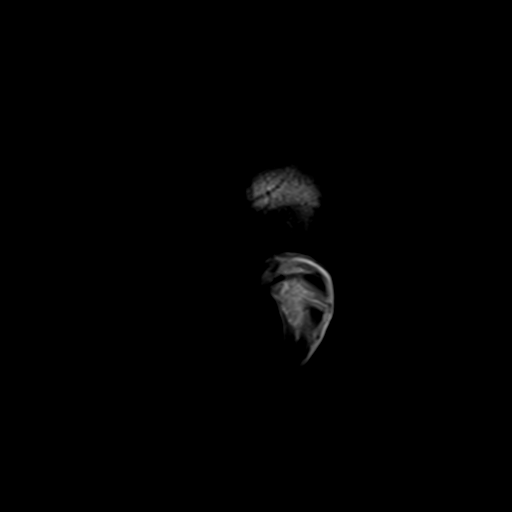
[im 10/30]
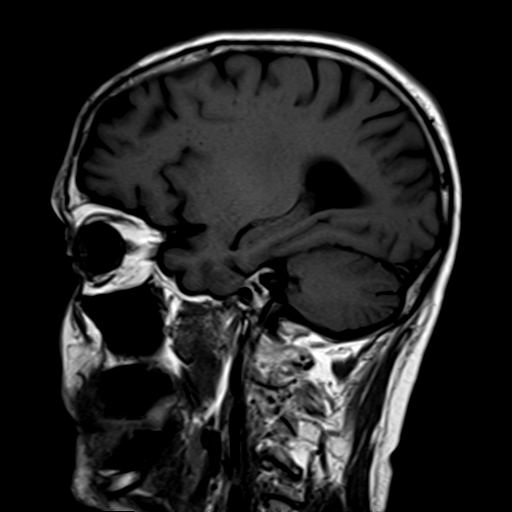
[im 20/30]
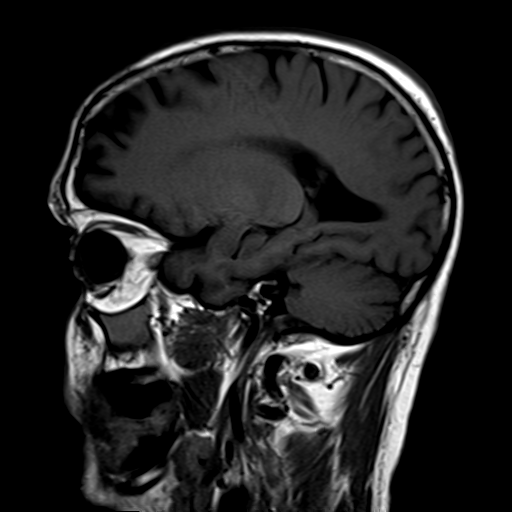
[im 30/30]
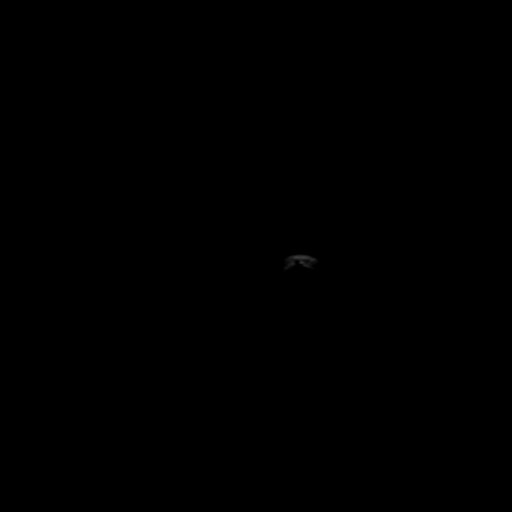

[Series 4: DWI · axial · 3.0mm · 1.80mm/px · z∈[-53,+109]mm · 6 of 50 slices shown (1 of 4)]
[im 1/50]
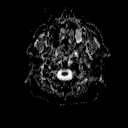
[im 10/50]
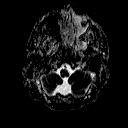
[im 20/50]
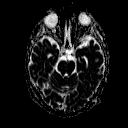
[im 30/50]
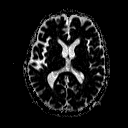
[im 40/50]
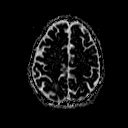
[im 50/50]
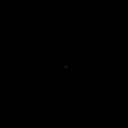

[Series 6: DWI · coronal · 3.0mm · 1.80mm/px · 6 of 51 slices shown (2 of 4)]
[im 1/51]
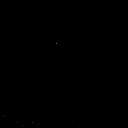
[im 11/51]
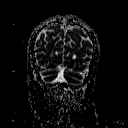
[im 21/51]
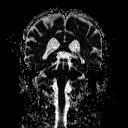
[im 31/51]
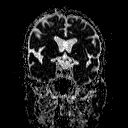
[im 41/51]
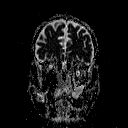
[im 51/51]
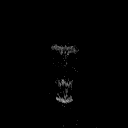

[Series 7: T2 · axial · 5.0mm · 0.60mm/px · z∈[-57,+112]mm · 3 of 27 slices shown (1 of 3)]
[im 1/27]
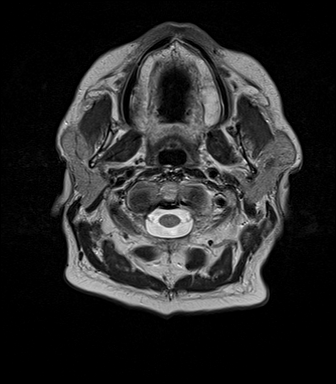
[im 14/27]
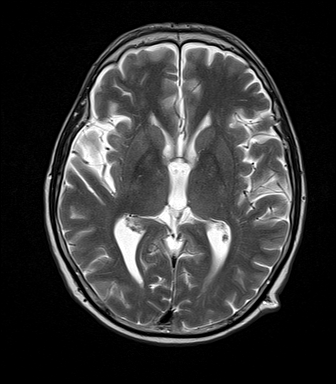
[im 27/27]
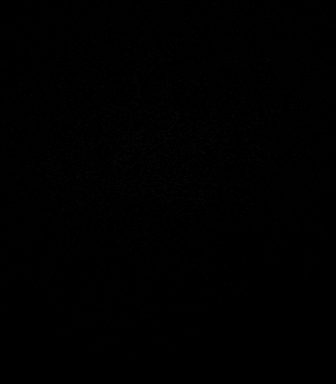

[Series 8: FLAIR · axial · 5.0mm · 0.45mm/px · z∈[-57,+112]mm · 3 of 27 slices shown]
[im 1/27]
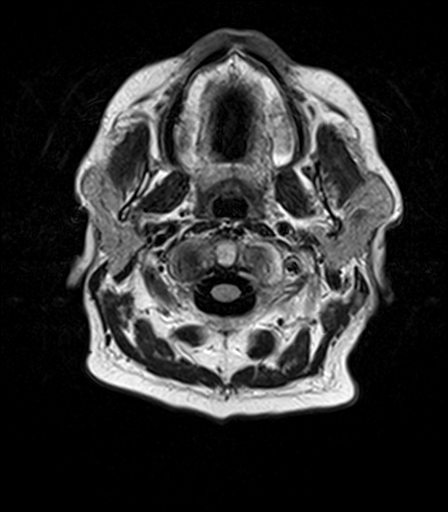
[im 14/27]
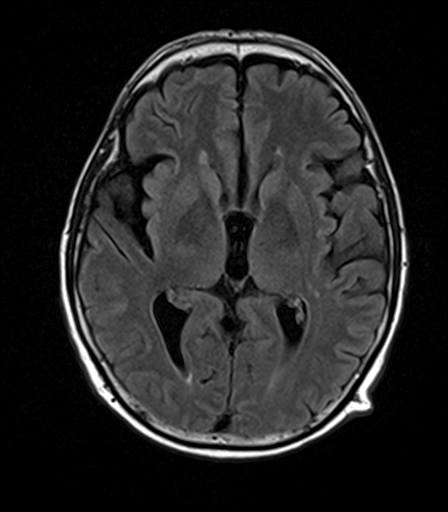
[im 27/27]
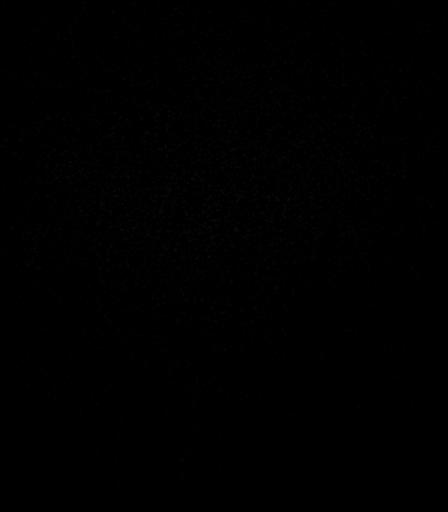

[Series 9: T2 · axial · 5.0mm · 0.45mm/px · z∈[-57,+112]mm · 3 of 27 slices shown (2 of 3)]
[im 1/27]
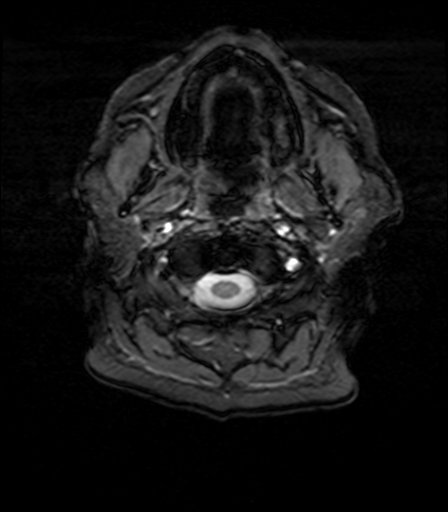
[im 14/27]
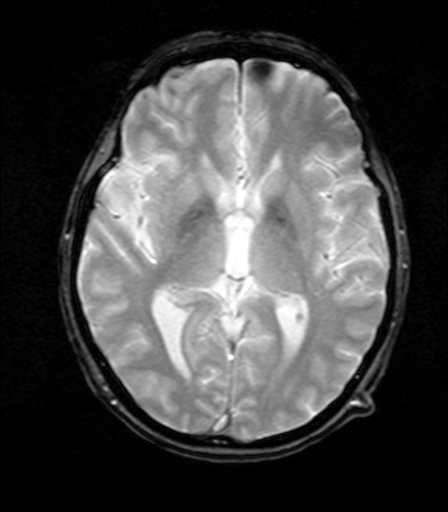
[im 27/27]
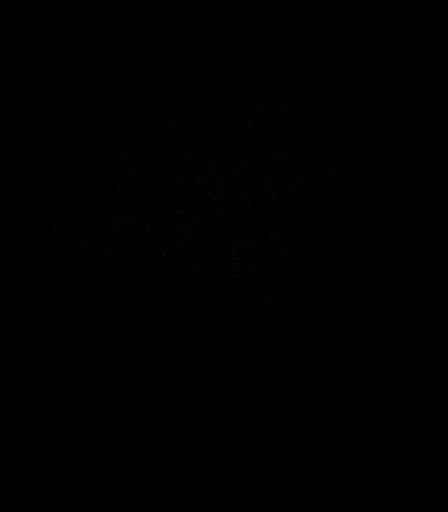

[Series 10: T1 · axial · 3.0mm · 1.00mm/px · z∈[-60,+117]mm · 7 of 60 slices shown (2 of 2)]
[im 1/60]
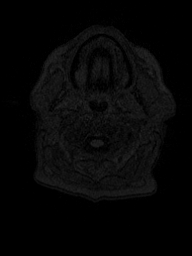
[im 10/60]
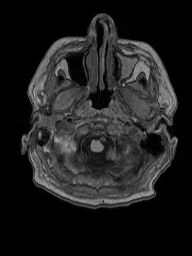
[im 20/60]
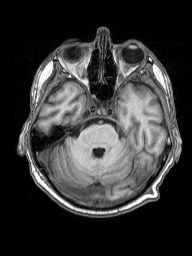
[im 30/60]
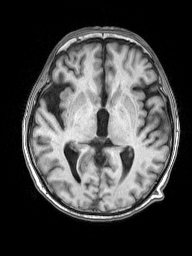
[im 40/60]
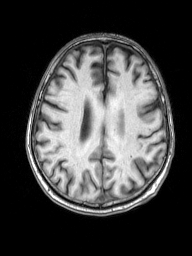
[im 50/60]
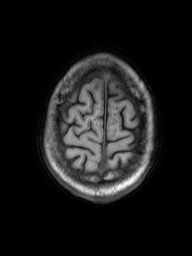
[im 60/60]
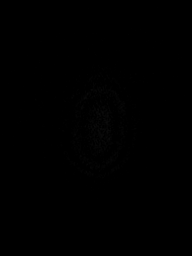

[Series 11: T2 · coronal · 5.0mm · 0.49mm/px · 4 of 31 slices shown (3 of 3)]
[im 1/31]
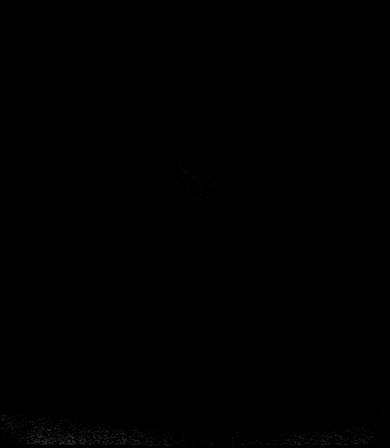
[im 11/31]
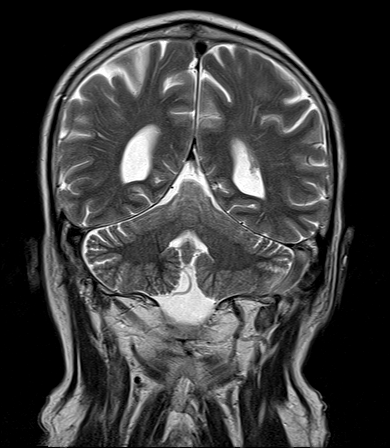
[im 21/31]
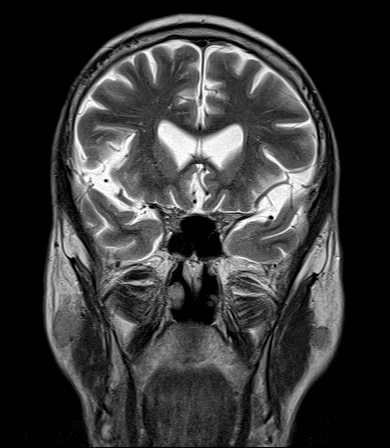
[im 31/31]
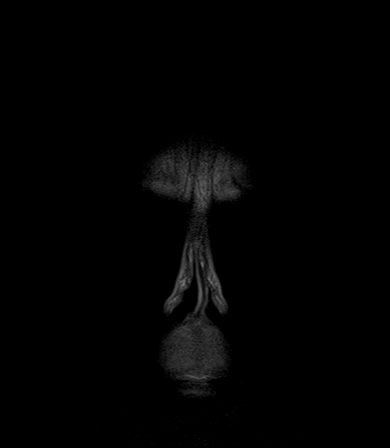

[Series 100: DWI · axial · 3.0mm · 1.80mm/px · z∈[-53,+109]mm · 6 of 55 slices shown (3 of 4)]
[im 1/55]
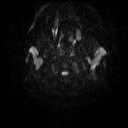
[im 11/55]
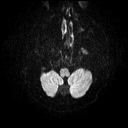
[im 22/55]
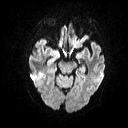
[im 33/55]
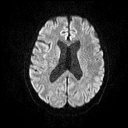
[im 44/55]
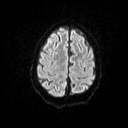
[im 55/55]
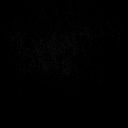

[Series 101: DWI · coronal · 3.0mm · 1.80mm/px · 6 of 50 slices shown (4 of 4)]
[im 1/50]
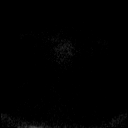
[im 10/50]
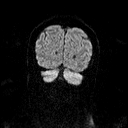
[im 20/50]
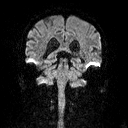
[im 30/50]
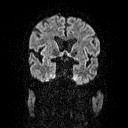
[im 40/50]
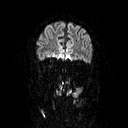
[im 50/50]
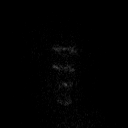

[48 of 48 positions shown; findings below may reference images not displayed]

FINDINGS: INTRACRANIAL CONTENTS: No reduced diffusion to suggest acute
ischemia. No susceptibility artifact to suggest hemorrhage. Moderate
ventriculomegaly on the basis of global parenchymal brain volume
loss. Scattered sub cm supratentorial white matter FLAIR T2
hyperintensities consistent with chronic small vessel ischemic
disease, stable. No suspicious parenchymal signal, masses or mass
effect. No abnormal extra-axial fluid collections. No extra-axial
masses though, contrast enhanced sequences would be more sensitive.
Normal major intracranial vascular flow voids present at skull base.

ORBITS: The included ocular globes and orbital contents are
non-suspicious.

SINUSES: Chronic LEFT maxillary sinusitis with atresia.

SKULL/SOFT TISSUES: No abnormal sellar expansion. No suspicious
calvarial bone marrow signal. Craniocervical junction maintained.
Patient is edentulous.
IMPRESSION: No acute intracranial process.

Stable moderate global parenchymal brain volume loss for age and
mild chronic small vessel ischemic disease.
